# Patient Record
Sex: Male | Born: 1935 | Race: Black or African American | Hispanic: No | Marital: Married | Smoking: Former smoker
Health system: Southern US, Community
[De-identification: ages and names within clinical notes are randomized; demographics above are authoritative.]

## PROBLEM LIST (undated history)

## (undated) DIAGNOSIS — E118 Type 2 diabetes mellitus with unspecified complications: Secondary | ICD-10-CM

## (undated) DIAGNOSIS — J209 Acute bronchitis, unspecified: Secondary | ICD-10-CM

## (undated) DIAGNOSIS — M109 Gout, unspecified: Secondary | ICD-10-CM

## (undated) DIAGNOSIS — H409 Unspecified glaucoma: Secondary | ICD-10-CM

## (undated) DIAGNOSIS — I10 Essential (primary) hypertension: Secondary | ICD-10-CM

## (undated) DIAGNOSIS — C9510 Chronic leukemia of unspecified cell type not having achieved remission: Secondary | ICD-10-CM

## (undated) DIAGNOSIS — E785 Hyperlipidemia, unspecified: Secondary | ICD-10-CM

## (undated) HISTORY — DX: Essential (primary) hypertension: I10

## (undated) HISTORY — PX: COLONOSCOPY W/ POLYPECTOMY: SHX1380

## (undated) HISTORY — PX: BACK SURGERY: SHX140

## (undated) HISTORY — DX: Hyperlipidemia, unspecified: E78.5

## (undated) HISTORY — PX: TONSILLECTOMY: SUR1361

## (undated) HISTORY — DX: Acute bronchitis, unspecified: J20.9

## (undated) HISTORY — PX: DENTAL SURGERY: SHX609

---

## 1987-05-03 HISTORY — PX: HERNIA REPAIR: SHX51

## 1995-05-03 DIAGNOSIS — E118 Type 2 diabetes mellitus with unspecified complications: Secondary | ICD-10-CM

## 1995-05-03 HISTORY — DX: Type 2 diabetes mellitus with unspecified complications: E11.8

## 1995-05-03 HISTORY — PX: LUMBAR LAMINECTOMY: SHX95

## 1999-01-19 ENCOUNTER — Other Ambulatory Visit: Admission: RE | Admit: 1999-01-19 | Discharge: 1999-01-19 | Payer: Self-pay | Admitting: *Deleted

## 1999-02-11 ENCOUNTER — Encounter: Admission: RE | Admit: 1999-02-11 | Discharge: 1999-02-11 | Payer: Self-pay | Admitting: *Deleted

## 1999-02-24 ENCOUNTER — Encounter: Payer: Self-pay | Admitting: *Deleted

## 1999-02-24 ENCOUNTER — Ambulatory Visit (HOSPITAL_COMMUNITY): Admission: RE | Admit: 1999-02-24 | Discharge: 1999-02-24 | Payer: Self-pay | Admitting: *Deleted

## 1999-02-28 ENCOUNTER — Ambulatory Visit (HOSPITAL_COMMUNITY): Admission: RE | Admit: 1999-02-28 | Discharge: 1999-02-28 | Payer: Self-pay | Admitting: Orthopaedic Surgery

## 1999-02-28 ENCOUNTER — Encounter: Payer: Self-pay | Admitting: Orthopaedic Surgery

## 1999-03-09 ENCOUNTER — Encounter: Admission: RE | Admit: 1999-03-09 | Discharge: 1999-06-07 | Payer: Self-pay | Admitting: Anesthesiology

## 1999-05-03 DIAGNOSIS — C9510 Chronic leukemia of unspecified cell type not having achieved remission: Secondary | ICD-10-CM

## 1999-05-03 HISTORY — DX: Chronic leukemia of unspecified cell type not having achieved remission: C95.10

## 1999-08-30 ENCOUNTER — Ambulatory Visit (HOSPITAL_COMMUNITY): Admission: RE | Admit: 1999-08-30 | Discharge: 1999-08-31 | Payer: Self-pay | Admitting: Neurosurgery

## 1999-08-30 ENCOUNTER — Encounter: Payer: Self-pay | Admitting: Neurosurgery

## 2003-01-13 ENCOUNTER — Ambulatory Visit (HOSPITAL_COMMUNITY): Admission: RE | Admit: 2003-01-13 | Discharge: 2003-01-13 | Payer: Self-pay | Admitting: Oncology

## 2003-01-13 ENCOUNTER — Encounter (HOSPITAL_COMMUNITY): Payer: Self-pay | Admitting: Oncology

## 2003-08-02 ENCOUNTER — Emergency Department (HOSPITAL_COMMUNITY): Admission: AD | Admit: 2003-08-02 | Discharge: 2003-08-02 | Payer: Self-pay | Admitting: Internal Medicine

## 2004-03-16 ENCOUNTER — Ambulatory Visit: Payer: Self-pay | Admitting: Internal Medicine

## 2004-06-17 ENCOUNTER — Ambulatory Visit: Payer: Self-pay | Admitting: Oncology

## 2004-08-02 ENCOUNTER — Ambulatory Visit: Payer: Self-pay | Admitting: Internal Medicine

## 2004-12-15 ENCOUNTER — Ambulatory Visit: Payer: Self-pay | Admitting: Oncology

## 2004-12-28 ENCOUNTER — Ambulatory Visit (HOSPITAL_COMMUNITY): Admission: RE | Admit: 2004-12-28 | Discharge: 2004-12-28 | Payer: Self-pay | Admitting: Oncology

## 2005-01-28 ENCOUNTER — Ambulatory Visit: Payer: Self-pay | Admitting: Internal Medicine

## 2005-02-01 ENCOUNTER — Ambulatory Visit: Payer: Self-pay | Admitting: Internal Medicine

## 2005-03-29 ENCOUNTER — Ambulatory Visit: Payer: Self-pay | Admitting: Internal Medicine

## 2005-08-02 ENCOUNTER — Ambulatory Visit: Payer: Self-pay | Admitting: Internal Medicine

## 2005-08-03 ENCOUNTER — Encounter: Admission: RE | Admit: 2005-08-03 | Discharge: 2005-08-03 | Payer: Self-pay | Admitting: Internal Medicine

## 2005-12-19 ENCOUNTER — Ambulatory Visit: Payer: Self-pay | Admitting: Oncology

## 2005-12-26 LAB — CBC WITH DIFFERENTIAL/PLATELET
BASO%: 0.3 % (ref 0.0–2.0)
Basophils Absolute: 0 10*3/uL (ref 0.0–0.1)
EOS%: 0.5 % (ref 0.0–7.0)
Eosinophils Absolute: 0.1 10*3/uL (ref 0.0–0.5)
HCT: 39.2 % (ref 38.7–49.9)
HGB: 12.8 g/dL — ABNORMAL LOW (ref 13.0–17.1)
LYMPH%: 75.6 % — ABNORMAL HIGH (ref 14.0–48.0)
MCH: 29.4 pg (ref 28.0–33.4)
MCHC: 32.8 g/dL (ref 32.0–35.9)
MCV: 89.6 fL (ref 81.6–98.0)
MONO#: 0.2 10*3/uL (ref 0.1–0.9)
MONO%: 1.1 % (ref 0.0–13.0)
NEUT#: 3.3 10*3/uL (ref 1.5–6.5)
NEUT%: 22.5 % — ABNORMAL LOW (ref 40.0–75.0)
Platelets: 177 10*3/uL (ref 145–400)
RBC: 4.37 10*6/uL (ref 4.20–5.71)
RDW: 14.7 % — ABNORMAL HIGH (ref 11.2–14.6)
WBC: 14.6 10*3/uL — ABNORMAL HIGH (ref 4.0–10.0)
lymph#: 11 10*3/uL — ABNORMAL HIGH (ref 0.9–3.3)

## 2005-12-26 LAB — LACTATE DEHYDROGENASE: LDH: 117 U/L (ref 94–250)

## 2005-12-26 LAB — COMPREHENSIVE METABOLIC PANEL
ALT: <8 U/L (ref 0–40)
AST: 12 U/L (ref 0–37)
Albumin: 4.1 g/dL (ref 3.5–5.2)
Alkaline Phosphatase: 73 U/L (ref 39–117)
BUN: 14 mg/dL (ref 6–23)
CO2: 26 mEq/L (ref 19–32)
Calcium: 9.3 mg/dL (ref 8.4–10.5)
Chloride: 108 mEq/L (ref 96–112)
Creatinine, Ser: 1.41 mg/dL (ref 0.40–1.50)
Glucose, Bld: 187 mg/dL — ABNORMAL HIGH (ref 70–99)
Potassium: 4.5 mEq/L (ref 3.5–5.3)
Sodium: 143 mEq/L (ref 135–145)
Total Bilirubin: 0.7 mg/dL (ref 0.3–1.2)
Total Protein: 6.5 g/dL (ref 6.0–8.3)

## 2006-02-02 ENCOUNTER — Ambulatory Visit: Payer: Self-pay | Admitting: Internal Medicine

## 2006-03-07 ENCOUNTER — Ambulatory Visit: Payer: Self-pay | Admitting: Internal Medicine

## 2006-05-11 ENCOUNTER — Ambulatory Visit: Payer: Self-pay | Admitting: Internal Medicine

## 2006-10-10 ENCOUNTER — Ambulatory Visit: Payer: Self-pay | Admitting: Internal Medicine

## 2006-10-10 LAB — CONVERTED CEMR LAB
BUN: 10 mg/dL (ref 6–23)
CO2: 30 meq/L (ref 19–32)
Calcium: 9.8 mg/dL (ref 8.4–10.5)
Chloride: 105 meq/L (ref 96–112)
Creatinine, Ser: 1.3 mg/dL (ref 0.4–1.5)
GFR calc Af Amer: 70 mL/min
GFR calc non Af Amer: 58 mL/min
Glucose, Bld: 88 mg/dL (ref 70–99)
Hgb A1c MFr Bld: 6.1 % — ABNORMAL HIGH (ref 4.6–6.0)
Potassium: 4.4 meq/L (ref 3.5–5.1)
Sodium: 142 meq/L (ref 135–145)

## 2006-12-24 ENCOUNTER — Ambulatory Visit: Payer: Self-pay | Admitting: Oncology

## 2006-12-26 ENCOUNTER — Ambulatory Visit (HOSPITAL_COMMUNITY): Admission: RE | Admit: 2006-12-26 | Discharge: 2006-12-26 | Payer: Self-pay | Admitting: Oncology

## 2006-12-26 ENCOUNTER — Encounter: Payer: Self-pay | Admitting: Internal Medicine

## 2006-12-26 LAB — COMPREHENSIVE METABOLIC PANEL
ALT: 11 U/L (ref 0–53)
AST: 13 U/L (ref 0–37)
Albumin: 4.2 g/dL (ref 3.5–5.2)
Alkaline Phosphatase: 76 U/L (ref 39–117)
BUN: 13 mg/dL (ref 6–23)
CO2: 25 mEq/L (ref 19–32)
Calcium: 9.5 mg/dL (ref 8.4–10.5)
Chloride: 110 mEq/L (ref 96–112)
Creatinine, Ser: 1.41 mg/dL (ref 0.40–1.50)
Glucose, Bld: 121 mg/dL — ABNORMAL HIGH (ref 70–99)
Potassium: 4.2 mEq/L (ref 3.5–5.3)
Sodium: 143 mEq/L (ref 135–145)
Total Bilirubin: 1 mg/dL (ref 0.3–1.2)
Total Protein: 6.3 g/dL (ref 6.0–8.3)

## 2006-12-26 LAB — CBC WITH DIFFERENTIAL/PLATELET
BASO%: 0.4 % (ref 0.0–2.0)
Basophils Absolute: 0.1 10*3/uL (ref 0.0–0.1)
EOS%: 0.6 % (ref 0.0–7.0)
Eosinophils Absolute: 0.1 10*3/uL (ref 0.0–0.5)
HCT: 37.9 % — ABNORMAL LOW (ref 38.7–49.9)
HGB: 12.8 g/dL — ABNORMAL LOW (ref 13.0–17.1)
LYMPH%: 76.5 % — ABNORMAL HIGH (ref 14.0–48.0)
MCH: 29.8 pg (ref 28.0–33.4)
MCHC: 33.8 g/dL (ref 32.0–35.9)
MCV: 88.1 fL (ref 81.6–98.0)
MONO#: 0.2 10*3/uL (ref 0.1–0.9)
MONO%: 1.4 % (ref 0.0–13.0)
NEUT#: 3.7 10*3/uL (ref 1.5–6.5)
NEUT%: 21.1 % — ABNORMAL LOW (ref 40.0–75.0)
Platelets: 166 10*3/uL (ref 145–400)
RBC: 4.3 10*6/uL (ref 4.20–5.71)
RDW: 14.6 % (ref 11.2–14.6)
WBC: 17.5 10*3/uL — ABNORMAL HIGH (ref 4.0–10.0)
lymph#: 13.4 10*3/uL — ABNORMAL HIGH (ref 0.9–3.3)

## 2006-12-26 LAB — LACTATE DEHYDROGENASE: LDH: 97 U/L (ref 94–250)

## 2007-01-23 ENCOUNTER — Telehealth: Payer: Self-pay | Admitting: *Deleted

## 2007-03-08 ENCOUNTER — Ambulatory Visit: Payer: Self-pay | Admitting: Internal Medicine

## 2007-03-08 DIAGNOSIS — M545 Low back pain, unspecified: Secondary | ICD-10-CM | POA: Insufficient documentation

## 2007-03-08 DIAGNOSIS — M109 Gout, unspecified: Secondary | ICD-10-CM | POA: Insufficient documentation

## 2007-03-08 DIAGNOSIS — M1990 Unspecified osteoarthritis, unspecified site: Secondary | ICD-10-CM | POA: Insufficient documentation

## 2007-03-08 DIAGNOSIS — E119 Type 2 diabetes mellitus without complications: Secondary | ICD-10-CM | POA: Insufficient documentation

## 2007-03-08 DIAGNOSIS — C9110 Chronic lymphocytic leukemia of B-cell type not having achieved remission: Secondary | ICD-10-CM | POA: Insufficient documentation

## 2007-03-08 DIAGNOSIS — I10 Essential (primary) hypertension: Secondary | ICD-10-CM | POA: Insufficient documentation

## 2007-03-08 DIAGNOSIS — H409 Unspecified glaucoma: Secondary | ICD-10-CM | POA: Insufficient documentation

## 2007-03-08 LAB — CONVERTED CEMR LAB
ALT: 18 units/L (ref 0–53)
AST: 21 units/L (ref 0–37)
Albumin: 3.8 g/dL (ref 3.5–5.2)
Alkaline Phosphatase: 67 units/L (ref 39–117)
BUN: 13 mg/dL (ref 6–23)
Basophils Absolute: 0 10*3/uL (ref 0.0–0.1)
Basophils Relative: 0 % (ref 0.0–1.0)
Bilirubin Urine: NEGATIVE
Bilirubin, Direct: 0.2 mg/dL (ref 0.0–0.3)
CO2: 29 meq/L (ref 19–32)
Calcium: 9.5 mg/dL (ref 8.4–10.5)
Chloride: 108 meq/L (ref 96–112)
Cholesterol: 190 mg/dL (ref 0–200)
Creatinine, Ser: 1.3 mg/dL (ref 0.4–1.5)
Creatinine,U: 99.1 mg/dL
Eosinophils Absolute: 0.2 10*3/uL (ref 0.0–0.6)
Eosinophils Relative: 1.2 % (ref 0.0–5.0)
GFR calc Af Amer: 70 mL/min
GFR calc non Af Amer: 58 mL/min
Glucose, Bld: 106 mg/dL — ABNORMAL HIGH (ref 70–99)
Glucose, Urine, Semiquant: NEGATIVE
HCT: 38.2 % — ABNORMAL LOW (ref 39.0–52.0)
HDL: 41.3 mg/dL (ref >39.0)
Hemoglobin: 13.2 g/dL (ref 13.0–17.0)
Hgb A1c MFr Bld: 5.8 % (ref 4.6–6.0)
Ketones, urine, test strip: NEGATIVE
LDL Cholesterol: 127 mg/dL — ABNORMAL HIGH (ref 0–99)
Lymphocytes Relative: 74.9 % — ABNORMAL HIGH (ref 12.0–46.0)
MCHC: 34.6 g/dL (ref 30.0–36.0)
MCV: 90 fL (ref 78.0–100.0)
Microalb Creat Ratio: 5 mg/g (ref 0.0–30.0)
Microalb, Ur: 0.5 mg/dL (ref 0.0–1.9)
Monocytes Absolute: 0.6 10*3/uL (ref 0.2–0.7)
Monocytes Relative: 4.7 % (ref 3.0–11.0)
Neutro Abs: 2.6 10*3/uL (ref 1.4–7.7)
Neutrophils Relative %: 19.2 % — ABNORMAL LOW (ref 43.0–77.0)
Nitrite: NEGATIVE
PSA: 0.37 ng/mL (ref 0.10–4.00)
Platelets: 160 10*3/uL (ref 150–400)
Potassium: 4.4 meq/L (ref 3.5–5.1)
Protein, U semiquant: NEGATIVE
RBC: 4.25 M/uL (ref 4.22–5.81)
RDW: 13.5 % (ref 11.5–14.6)
Sodium: 143 meq/L (ref 135–145)
Specific Gravity, Urine: 1.015
TSH: 1.02 microintl units/mL (ref 0.35–5.50)
Total Bilirubin: 1.2 mg/dL (ref 0.3–1.2)
Total CHOL/HDL Ratio: 4.6
Total Protein: 6 g/dL (ref 6.0–8.3)
Triglycerides: 110 mg/dL (ref 0–149)
Urobilinogen, UA: 0.2
VLDL: 22 mg/dL (ref 0–40)
WBC Urine, dipstick: NEGATIVE
WBC: 13.7 10*3/uL — ABNORMAL HIGH (ref 4.5–10.5)
pH: 6

## 2007-03-15 ENCOUNTER — Ambulatory Visit: Payer: Self-pay | Admitting: Internal Medicine

## 2007-03-27 ENCOUNTER — Ambulatory Visit: Payer: Self-pay | Admitting: Gastroenterology

## 2007-04-09 ENCOUNTER — Encounter: Payer: Self-pay | Admitting: Internal Medicine

## 2007-04-09 ENCOUNTER — Ambulatory Visit: Payer: Self-pay | Admitting: Gastroenterology

## 2007-09-12 ENCOUNTER — Ambulatory Visit: Payer: Self-pay | Admitting: Internal Medicine

## 2007-09-12 DIAGNOSIS — J209 Acute bronchitis, unspecified: Secondary | ICD-10-CM

## 2007-09-12 HISTORY — DX: Acute bronchitis, unspecified: J20.9

## 2007-09-12 LAB — CONVERTED CEMR LAB
BUN: 14 mg/dL (ref 6–23)
Basophils Absolute: 0 10*3/uL (ref 0.0–0.1)
Basophils Relative: 0 % (ref 0.0–1.0)
CO2: 31 meq/L (ref 19–32)
Calcium: 9.8 mg/dL (ref 8.4–10.5)
Chloride: 112 meq/L (ref 96–112)
Creatinine, Ser: 1.4 mg/dL (ref 0.4–1.5)
Creatinine,U: 80.3 mg/dL
Eosinophils Absolute: 0.1 10*3/uL (ref 0.0–0.7)
Eosinophils Relative: 0.9 % (ref 0.0–5.0)
GFR calc Af Amer: 64 mL/min
GFR calc non Af Amer: 53 mL/min
Glucose, Bld: 113 mg/dL — ABNORMAL HIGH (ref 70–99)
HCT: 39.9 % (ref 39.0–52.0)
Hemoglobin: 13 g/dL (ref 13.0–17.0)
Hgb A1c MFr Bld: 6.1 % — ABNORMAL HIGH (ref 4.6–6.0)
Lymphocytes Relative: 73.6 % — ABNORMAL HIGH (ref 12.0–46.0)
MCHC: 32.7 g/dL (ref 30.0–36.0)
MCV: 90.6 fL (ref 78.0–100.0)
Microalb Creat Ratio: 31.1 mg/g — ABNORMAL HIGH (ref 0.0–30.0)
Microalb, Ur: 2.5 mg/dL — ABNORMAL HIGH (ref 0.0–1.9)
Monocytes Absolute: 0.5 10*3/uL (ref 0.1–1.0)
Monocytes Relative: 3.7 % (ref 3.0–12.0)
Neutro Abs: 3 10*3/uL (ref 1.4–7.7)
Neutrophils Relative %: 21.8 % — ABNORMAL LOW (ref 43.0–77.0)
Platelets: 207 10*3/uL (ref 150–400)
Potassium: 4.9 meq/L (ref 3.5–5.1)
RBC: 4.41 M/uL (ref 4.22–5.81)
RDW: 13.8 % (ref 11.5–14.6)
Sodium: 147 meq/L — ABNORMAL HIGH (ref 135–145)
Uric Acid, Serum: 4.2 mg/dL (ref 4.0–7.8)
WBC: 13.7 10*3/uL — ABNORMAL HIGH (ref 4.5–10.5)

## 2007-12-25 ENCOUNTER — Ambulatory Visit: Payer: Self-pay | Admitting: Oncology

## 2007-12-27 ENCOUNTER — Encounter: Payer: Self-pay | Admitting: Internal Medicine

## 2007-12-27 LAB — COMPREHENSIVE METABOLIC PANEL
ALT: 13 U/L (ref 0–53)
AST: 16 U/L (ref 0–37)
Albumin: 4.2 g/dL (ref 3.5–5.2)
Alkaline Phosphatase: 72 U/L (ref 39–117)
BUN: 14 mg/dL (ref 6–23)
CO2: 24 mEq/L (ref 19–32)
Calcium: 9.6 mg/dL (ref 8.4–10.5)
Chloride: 110 mEq/L (ref 96–112)
Creatinine, Ser: 1.27 mg/dL (ref 0.40–1.50)
Glucose, Bld: 173 mg/dL — ABNORMAL HIGH (ref 70–99)
Potassium: 4.2 mEq/L (ref 3.5–5.3)
Sodium: 144 mEq/L (ref 135–145)
Total Bilirubin: 1 mg/dL (ref 0.3–1.2)
Total Protein: 6.6 g/dL (ref 6.0–8.3)

## 2007-12-27 LAB — CBC WITH DIFFERENTIAL/PLATELET
BASO%: 0.2 % (ref 0.0–2.0)
Basophils Absolute: 0 10*3/uL (ref 0.0–0.1)
EOS%: 1.1 % (ref 0.0–7.0)
Eosinophils Absolute: 0.1 10*3/uL (ref 0.0–0.5)
HCT: 40.2 % (ref 38.7–49.9)
HGB: 13.3 g/dL (ref 13.0–17.1)
LYMPH%: 74.9 % — ABNORMAL HIGH (ref 14.0–48.0)
MCH: 30.2 pg (ref 28.0–33.4)
MCHC: 33 g/dL (ref 32.0–35.9)
MCV: 91.5 fL (ref 81.6–98.0)
MONO#: 0.2 10*3/uL (ref 0.1–0.9)
MONO%: 1.8 % (ref 0.0–13.0)
NEUT#: 2.8 10*3/uL (ref 1.5–6.5)
NEUT%: 22 % — ABNORMAL LOW (ref 40.0–75.0)
Platelets: 162 10*3/uL (ref 145–400)
RBC: 4.39 10*6/uL (ref 4.20–5.71)
RDW: 14.9 % — ABNORMAL HIGH (ref 11.2–14.6)
WBC: 12.6 10*3/uL — ABNORMAL HIGH (ref 4.0–10.0)
lymph#: 9.4 10*3/uL — ABNORMAL HIGH (ref 0.9–3.3)

## 2007-12-27 LAB — LACTATE DEHYDROGENASE: LDH: 96 U/L (ref 94–250)

## 2008-01-31 ENCOUNTER — Ambulatory Visit: Payer: Self-pay | Admitting: Internal Medicine

## 2008-03-18 ENCOUNTER — Ambulatory Visit: Payer: Self-pay | Admitting: Internal Medicine

## 2008-03-18 DIAGNOSIS — F528 Other sexual dysfunction not due to a substance or known physiological condition: Secondary | ICD-10-CM | POA: Insufficient documentation

## 2008-03-18 LAB — CONVERTED CEMR LAB
ALT: 14 units/L (ref 0–53)
AST: 19 units/L (ref 0–37)
Albumin: 4 g/dL (ref 3.5–5.2)
Alkaline Phosphatase: 66 units/L (ref 39–117)
BUN: 13 mg/dL (ref 6–23)
Basophils Absolute: 0 10*3/uL (ref 0.0–0.1)
Basophils Relative: 0.1 % (ref 0.0–3.0)
Bilirubin, Direct: 0.2 mg/dL (ref 0.0–0.3)
CO2: 30 meq/L (ref 19–32)
Calcium: 9.2 mg/dL (ref 8.4–10.5)
Chloride: 110 meq/L (ref 96–112)
Cholesterol: 176 mg/dL (ref 0–200)
Creatinine, Ser: 1.3 mg/dL (ref 0.4–1.5)
Eosinophils Absolute: 0.1 10*3/uL (ref 0.0–0.7)
Eosinophils Relative: 0.9 % (ref 0.0–5.0)
GFR calc Af Amer: 69 mL/min
GFR calc non Af Amer: 57 mL/min
Glucose, Bld: 109 mg/dL — ABNORMAL HIGH (ref 70–99)
HCT: 42.1 % (ref 39.0–52.0)
HDL: 59.8 mg/dL (ref >39.0)
Hemoglobin: 13.6 g/dL (ref 13.0–17.0)
Hgb A1c MFr Bld: 5.6 % (ref 4.6–6.0)
LDL Cholesterol: 106 mg/dL — ABNORMAL HIGH (ref 0–99)
Lymphocytes Relative: 71.2 % — ABNORMAL HIGH (ref 12.0–46.0)
MCHC: 32.2 g/dL (ref 30.0–36.0)
MCV: 93.6 fL (ref 78.0–100.0)
Monocytes Absolute: 0.8 10*3/uL (ref 0.1–1.0)
Monocytes Relative: 6.2 % (ref 3.0–12.0)
Neutro Abs: 2.8 10*3/uL (ref 1.4–7.7)
Neutrophils Relative %: 21.6 % — ABNORMAL LOW (ref 43.0–77.0)
PSA: 0.47 ng/mL (ref 0.10–4.00)
Platelets: 148 10*3/uL — ABNORMAL LOW (ref 150–400)
Potassium: 4.6 meq/L (ref 3.5–5.1)
RBC: 4.5 M/uL (ref 4.22–5.81)
RDW: 13.8 % (ref 11.5–14.6)
Sodium: 145 meq/L (ref 135–145)
TSH: 1.11 microintl units/mL (ref 0.35–5.50)
Total Bilirubin: 1.2 mg/dL (ref 0.3–1.2)
Total CHOL/HDL Ratio: 2.9
Total Protein: 6.4 g/dL (ref 6.0–8.3)
Triglycerides: 53 mg/dL (ref 0–149)
Uric Acid, Serum: 4.6 mg/dL (ref 4.0–7.8)
VLDL: 11 mg/dL (ref 0–40)
WBC: 13.1 10*3/uL — ABNORMAL HIGH (ref 4.5–10.5)

## 2008-08-02 ENCOUNTER — Telehealth: Payer: Self-pay | Admitting: Family Medicine

## 2008-08-02 ENCOUNTER — Ambulatory Visit: Payer: Self-pay | Admitting: Family Medicine

## 2008-08-02 DIAGNOSIS — M545 Low back pain, unspecified: Secondary | ICD-10-CM | POA: Insufficient documentation

## 2008-08-04 ENCOUNTER — Ambulatory Visit: Payer: Self-pay | Admitting: Internal Medicine

## 2008-08-04 DIAGNOSIS — IMO0002 Reserved for concepts with insufficient information to code with codable children: Secondary | ICD-10-CM | POA: Insufficient documentation

## 2008-08-04 LAB — CONVERTED CEMR LAB
BUN: 18 mg/dL (ref 6–23)
CO2: 28 meq/L (ref 19–32)
Calcium: 9 mg/dL (ref 8.4–10.5)
Chloride: 109 meq/L (ref 96–112)
Creatinine, Ser: 1.3 mg/dL (ref 0.4–1.5)
GFR calc non Af Amer: 69.32 mL/min (ref >60)
Glucose, Bld: 108 mg/dL — ABNORMAL HIGH (ref 70–99)
Hgb A1c MFr Bld: 5.9 % (ref 4.6–6.5)
Potassium: 4.3 meq/L (ref 3.5–5.1)
Sodium: 142 meq/L (ref 135–145)

## 2008-08-06 ENCOUNTER — Encounter: Admission: RE | Admit: 2008-08-06 | Discharge: 2008-08-06 | Payer: Self-pay | Admitting: Internal Medicine

## 2008-08-08 ENCOUNTER — Telehealth: Payer: Self-pay | Admitting: Internal Medicine

## 2008-08-22 ENCOUNTER — Telehealth: Payer: Self-pay | Admitting: Internal Medicine

## 2008-09-11 ENCOUNTER — Ambulatory Visit: Payer: Self-pay | Admitting: Internal Medicine

## 2008-09-11 LAB — CONVERTED CEMR LAB
Basophils Absolute: 0.1 10*3/uL (ref 0.0–0.1)
Basophils Relative: 0.4 % (ref 0.0–3.0)
Eosinophils Absolute: 0.1 10*3/uL (ref 0.0–0.7)
Eosinophils Relative: 0.5 % (ref 0.0–5.0)
HCT: 39.3 % (ref 39.0–52.0)
Hemoglobin: 13.2 g/dL (ref 13.0–17.0)
Hgb A1c MFr Bld: 6.4 % (ref 4.6–6.5)
Lymphocytes Relative: 73.2 % — ABNORMAL HIGH (ref 12.0–46.0)
Lymphs Abs: 13.5 10*3/uL — ABNORMAL HIGH (ref 0.7–4.0)
MCHC: 33.6 g/dL (ref 30.0–36.0)
MCV: 91.6 fL (ref 78.0–100.0)
Monocytes Absolute: 1.1 10*3/uL — ABNORMAL HIGH (ref 0.1–1.0)
Monocytes Relative: 5.8 % (ref 3.0–12.0)
Neutro Abs: 3.7 10*3/uL (ref 1.4–7.7)
Neutrophils Relative %: 20.1 % — ABNORMAL LOW (ref 43.0–77.0)
Platelets: 181 10*3/uL (ref 150.0–400.0)
RBC: 4.28 M/uL (ref 4.22–5.81)
RDW: 14.5 % (ref 11.5–14.6)
Uric Acid, Serum: 5.4 mg/dL (ref 4.0–7.8)
WBC: 18.5 10*3/uL (ref 4.5–10.5)

## 2008-12-12 ENCOUNTER — Ambulatory Visit: Payer: Self-pay | Admitting: Internal Medicine

## 2008-12-12 DIAGNOSIS — E538 Deficiency of other specified B group vitamins: Secondary | ICD-10-CM | POA: Insufficient documentation

## 2008-12-12 DIAGNOSIS — E1142 Type 2 diabetes mellitus with diabetic polyneuropathy: Secondary | ICD-10-CM | POA: Insufficient documentation

## 2008-12-12 LAB — CONVERTED CEMR LAB
BUN: 16 mg/dL (ref 6–23)
Basophils Absolute: 0.1 10*3/uL (ref 0.0–0.1)
Basophils Relative: 0.9 % (ref 0.0–3.0)
CO2: 31 meq/L (ref 19–32)
Calcium: 9.9 mg/dL (ref 8.4–10.5)
Chloride: 111 meq/L (ref 96–112)
Creatinine, Ser: 1.4 mg/dL (ref 0.4–1.5)
Eosinophils Absolute: 0.1 10*3/uL (ref 0.0–0.7)
Eosinophils Relative: 0.6 % (ref 0.0–5.0)
Folate: 11.1 ng/mL
GFR calc non Af Amer: 63.57 mL/min (ref >60)
Glucose, Bld: 84 mg/dL (ref 70–99)
HCT: 42 % (ref 39.0–52.0)
Hemoglobin: 13.6 g/dL (ref 13.0–17.0)
Hgb A1c MFr Bld: 5.7 % (ref 4.6–6.5)
Lymphocytes Relative: 66.9 % — ABNORMAL HIGH (ref 12.0–46.0)
Lymphs Abs: 8 10*3/uL — ABNORMAL HIGH (ref 0.7–4.0)
MCHC: 32.3 g/dL (ref 30.0–36.0)
MCV: 93.2 fL (ref 78.0–100.0)
Monocytes Absolute: 0.5 10*3/uL (ref 0.1–1.0)
Monocytes Relative: 3.9 % (ref 3.0–12.0)
Neutro Abs: 3.3 10*3/uL (ref 1.4–7.7)
Neutrophils Relative %: 27.7 % — ABNORMAL LOW (ref 43.0–77.0)
Platelets: 166 10*3/uL (ref 150.0–400.0)
Potassium: 4.9 meq/L (ref 3.5–5.1)
RBC: 4.51 M/uL (ref 4.22–5.81)
RDW: 13.5 % (ref 11.5–14.6)
Sodium: 146 meq/L — ABNORMAL HIGH (ref 135–145)
Vitamin B-12: 166 pg/mL — ABNORMAL LOW (ref 211–911)
WBC: 12 10*3/uL — ABNORMAL HIGH (ref 4.5–10.5)

## 2008-12-26 ENCOUNTER — Ambulatory Visit: Payer: Self-pay | Admitting: Oncology

## 2008-12-30 ENCOUNTER — Encounter: Payer: Self-pay | Admitting: Internal Medicine

## 2008-12-30 LAB — CBC WITH DIFFERENTIAL/PLATELET
BASO%: 0.5 % (ref 0.0–2.0)
Basophils Absolute: 0.1 10*3/uL (ref 0.0–0.1)
EOS%: 0.8 % (ref 0.0–7.0)
Eosinophils Absolute: 0.1 10*3/uL (ref 0.0–0.5)
HCT: 40.8 % (ref 38.4–49.9)
HGB: 13.2 g/dL (ref 13.0–17.1)
LYMPH%: 74.5 % — ABNORMAL HIGH (ref 14.0–49.0)
MCH: 29.6 pg (ref 27.2–33.4)
MCHC: 32.4 g/dL (ref 32.0–36.0)
MCV: 91.2 fL (ref 79.3–98.0)
MONO#: 0.3 10*3/uL (ref 0.1–0.9)
MONO%: 2.5 % (ref 0.0–14.0)
NEUT#: 2.8 10*3/uL (ref 1.5–6.5)
NEUT%: 21.7 % — ABNORMAL LOW (ref 39.0–75.0)
Platelets: 154 10*3/uL (ref 140–400)
RBC: 4.47 10*6/uL (ref 4.20–5.82)
RDW: 13.5 % (ref 11.0–14.6)
WBC: 12.8 10*3/uL — ABNORMAL HIGH (ref 4.0–10.3)
lymph#: 9.5 10*3/uL — ABNORMAL HIGH (ref 0.9–3.3)

## 2008-12-30 LAB — COMPREHENSIVE METABOLIC PANEL
ALT: 14 U/L (ref 0–53)
AST: 13 U/L (ref 0–37)
Albumin: 4.1 g/dL (ref 3.5–5.2)
Alkaline Phosphatase: 69 U/L (ref 39–117)
BUN: 13 mg/dL (ref 6–23)
CO2: 23 mEq/L (ref 19–32)
Calcium: 9.5 mg/dL (ref 8.4–10.5)
Chloride: 109 mEq/L (ref 96–112)
Creatinine, Ser: 1.33 mg/dL (ref 0.40–1.50)
Glucose, Bld: 153 mg/dL — ABNORMAL HIGH (ref 70–99)
Potassium: 4.4 mEq/L (ref 3.5–5.3)
Sodium: 141 mEq/L (ref 135–145)
Total Bilirubin: 0.7 mg/dL (ref 0.3–1.2)
Total Protein: 6.5 g/dL (ref 6.0–8.3)

## 2008-12-30 LAB — LACTATE DEHYDROGENASE: LDH: 92 U/L — ABNORMAL LOW (ref 94–250)

## 2009-01-23 ENCOUNTER — Telehealth: Payer: Self-pay | Admitting: Internal Medicine

## 2009-02-04 ENCOUNTER — Ambulatory Visit: Payer: Self-pay | Admitting: Internal Medicine

## 2009-03-24 ENCOUNTER — Ambulatory Visit: Payer: Self-pay | Admitting: Internal Medicine

## 2009-06-29 ENCOUNTER — Ambulatory Visit: Payer: Self-pay | Admitting: Internal Medicine

## 2009-07-27 ENCOUNTER — Ambulatory Visit: Payer: Self-pay | Admitting: Internal Medicine

## 2009-09-01 ENCOUNTER — Ambulatory Visit: Payer: Self-pay | Admitting: Internal Medicine

## 2009-09-29 ENCOUNTER — Ambulatory Visit: Payer: Self-pay | Admitting: Internal Medicine

## 2009-10-01 LAB — CONVERTED CEMR LAB
Hgb A1c MFr Bld: 6 % (ref 4.6–6.5)
Vitamin B-12: 270 pg/mL (ref 211–911)

## 2009-10-06 ENCOUNTER — Ambulatory Visit: Payer: Self-pay | Admitting: Internal Medicine

## 2009-11-04 ENCOUNTER — Ambulatory Visit: Payer: Self-pay | Admitting: Internal Medicine

## 2009-12-03 ENCOUNTER — Ambulatory Visit: Payer: Self-pay | Admitting: Internal Medicine

## 2009-12-29 ENCOUNTER — Ambulatory Visit: Payer: Self-pay | Admitting: Oncology

## 2009-12-31 ENCOUNTER — Encounter: Payer: Self-pay | Admitting: Internal Medicine

## 2009-12-31 LAB — CBC WITH DIFFERENTIAL/PLATELET
BASO%: 0.2 % (ref 0.0–2.0)
Basophils Absolute: 0 10*3/uL (ref 0.0–0.1)
EOS%: 0.8 % (ref 0.0–7.0)
Eosinophils Absolute: 0.1 10*3/uL (ref 0.0–0.5)
HCT: 39.8 % (ref 38.4–49.9)
HGB: 12.9 g/dL — ABNORMAL LOW (ref 13.0–17.1)
LYMPH%: 70.3 % — ABNORMAL HIGH (ref 14.0–49.0)
MCH: 29.9 pg (ref 27.2–33.4)
MCHC: 32.5 g/dL (ref 32.0–36.0)
MCV: 92.1 fL (ref 79.3–98.0)
MONO#: 0.3 10*3/uL (ref 0.1–0.9)
MONO%: 1.8 % (ref 0.0–14.0)
NEUT#: 4 10*3/uL (ref 1.5–6.5)
NEUT%: 26.9 % — ABNORMAL LOW (ref 39.0–75.0)
Platelets: 170 10*3/uL (ref 140–400)
RBC: 4.32 10*6/uL (ref 4.20–5.82)
RDW: 14.6 % (ref 11.0–14.6)
WBC: 15 10*3/uL — ABNORMAL HIGH (ref 4.0–10.3)
lymph#: 10.6 10*3/uL — ABNORMAL HIGH (ref 0.9–3.3)

## 2009-12-31 LAB — COMPREHENSIVE METABOLIC PANEL
ALT: 11 U/L (ref 0–53)
AST: 15 U/L (ref 0–37)
Albumin: 4 g/dL (ref 3.5–5.2)
Alkaline Phosphatase: 72 U/L (ref 39–117)
BUN: 18 mg/dL (ref 6–23)
CO2: 24 mEq/L (ref 19–32)
Calcium: 9.4 mg/dL (ref 8.4–10.5)
Chloride: 108 mEq/L (ref 96–112)
Creatinine, Ser: 1.49 mg/dL (ref 0.40–1.50)
Glucose, Bld: 134 mg/dL — ABNORMAL HIGH (ref 70–99)
Potassium: 4.3 mEq/L (ref 3.5–5.3)
Sodium: 143 mEq/L (ref 135–145)
Total Bilirubin: 0.9 mg/dL (ref 0.3–1.2)
Total Protein: 6.1 g/dL (ref 6.0–8.3)

## 2009-12-31 LAB — LACTATE DEHYDROGENASE: LDH: 108 U/L (ref 94–250)

## 2010-02-01 ENCOUNTER — Ambulatory Visit: Payer: Self-pay | Admitting: Internal Medicine

## 2010-03-24 ENCOUNTER — Ambulatory Visit: Payer: Self-pay | Admitting: Internal Medicine

## 2010-03-24 LAB — CONVERTED CEMR LAB
ALT: 11 units/L (ref 0–53)
AST: 12 units/L (ref 0–37)
Albumin: 4.1 g/dL (ref 3.5–5.2)
Alkaline Phosphatase: 67 units/L (ref 39–117)
BUN: 16 mg/dL (ref 6–23)
Basophils Absolute: 0 10*3/uL (ref 0.0–0.1)
Basophils Relative: 0 % (ref 0.0–3.0)
Bilirubin Urine: NEGATIVE
Bilirubin, Direct: 0.2 mg/dL (ref 0.0–0.3)
Blood in Urine, dipstick: NEGATIVE
CO2: 26 meq/L (ref 19–32)
Calcium: 9.2 mg/dL (ref 8.4–10.5)
Chloride: 108 meq/L (ref 96–112)
Cholesterol: 178 mg/dL (ref 0–200)
Creatinine, Ser: 1.36 mg/dL (ref 0.40–1.50)
Creatinine,U: 110.3 mg/dL
Eosinophils Absolute: 0.2 10*3/uL (ref 0.0–0.7)
Eosinophils Relative: 1.1 % (ref 0.0–5.0)
Glucose, Bld: 104 mg/dL — ABNORMAL HIGH (ref 70–99)
Glucose, Urine, Semiquant: NEGATIVE
HCT: 38.6 % — ABNORMAL LOW (ref 39.0–52.0)
HDL: 52 mg/dL (ref >39)
Hemoglobin: 12.6 g/dL — ABNORMAL LOW (ref 13.0–17.0)
Hgb A1c MFr Bld: 6.2 % (ref 4.6–6.5)
Indirect Bilirubin: 0.7 mg/dL (ref 0.0–0.9)
Ketones, urine, test strip: NEGATIVE
LDL Cholesterol: 110 mg/dL — ABNORMAL HIGH (ref 0–99)
Lymphocytes Relative: 78.6 % — ABNORMAL HIGH (ref 12.0–46.0)
Lymphs Abs: 11.2 10*3/uL — ABNORMAL HIGH (ref 0.7–4.0)
MCHC: 32.6 g/dL (ref 30.0–36.0)
MCV: 93.1 fL (ref 78.0–100.0)
Microalb Creat Ratio: 0.8 mg/g (ref 0.0–30.0)
Microalb, Ur: 0.9 mg/dL (ref 0.0–1.9)
Monocytes Absolute: 0.3 10*3/uL (ref 0.1–1.0)
Monocytes Relative: 2.1 % — ABNORMAL LOW (ref 3.0–12.0)
Neutro Abs: 2.6 10*3/uL (ref 1.4–7.7)
Neutrophils Relative %: 18.2 % — ABNORMAL LOW (ref 43.0–77.0)
Nitrite: NEGATIVE
PSA: 0.88 ng/mL (ref <=4.00)
Platelets: 166 10*3/uL (ref 150.0–400.0)
Potassium: 4.5 meq/L (ref 3.5–5.3)
Protein, U semiquant: NEGATIVE
RBC: 4.15 M/uL — ABNORMAL LOW (ref 4.22–5.81)
RDW: 14.5 % (ref 11.5–14.6)
Sodium: 142 meq/L (ref 135–145)
Specific Gravity, Urine: 1.015
TSH: 1.86 microintl units/mL (ref 0.350–4.500)
Total Bilirubin: 0.9 mg/dL (ref 0.3–1.2)
Total CHOL/HDL Ratio: 3.4
Total Protein: 6 g/dL (ref 6.0–8.3)
Triglycerides: 82 mg/dL (ref <150)
Urobilinogen, UA: 0.2
VLDL: 16 mg/dL (ref 0–40)
WBC Urine, dipstick: NEGATIVE
WBC: 14.2 10*3/uL — ABNORMAL HIGH (ref 4.5–10.5)
pH: 5.5

## 2010-03-30 ENCOUNTER — Ambulatory Visit: Payer: Self-pay | Admitting: Internal Medicine

## 2010-03-30 ENCOUNTER — Encounter: Payer: Self-pay | Admitting: Internal Medicine

## 2010-05-30 LAB — CONVERTED CEMR LAB
ALT: 15 units/L (ref 0–53)
AST: 18 units/L (ref 0–37)
Albumin: 4 g/dL (ref 3.5–5.2)
Alkaline Phosphatase: 72 units/L (ref 39–117)
BUN: 11 mg/dL (ref 6–23)
Basophils Absolute: 0 10*3/uL (ref 0.0–0.1)
Basophils Relative: 0 % (ref 0.0–3.0)
Bilirubin, Direct: 0.2 mg/dL (ref 0.0–0.3)
CO2: 32 meq/L (ref 19–32)
Calcium: 9.5 mg/dL (ref 8.4–10.5)
Chloride: 108 meq/L (ref 96–112)
Cholesterol: 184 mg/dL (ref 0–200)
Creatinine, Ser: 1.3 mg/dL (ref 0.4–1.5)
Eosinophils Absolute: 0.1 10*3/uL (ref 0.0–0.7)
Eosinophils Relative: 0.8 % (ref 0.0–5.0)
Folate: 14 ng/mL
GFR calc non Af Amer: 69.2 mL/min (ref >60)
Glucose, Bld: 101 mg/dL — ABNORMAL HIGH (ref 70–99)
HCT: 41.6 % (ref 39.0–52.0)
HDL: 54.1 mg/dL (ref >39.00)
Hemoglobin: 13.7 g/dL (ref 13.0–17.0)
LDL Cholesterol: 116 mg/dL — ABNORMAL HIGH (ref 0–99)
Lymphocytes Relative: 66.5 % — ABNORMAL HIGH (ref 12.0–46.0)
Lymphs Abs: 8.5 10*3/uL — ABNORMAL HIGH (ref 0.7–4.0)
MCHC: 32.9 g/dL (ref 30.0–36.0)
MCV: 93 fL (ref 78.0–100.0)
Monocytes Absolute: 0.8 10*3/uL (ref 0.1–1.0)
Monocytes Relative: 6.2 % (ref 3.0–12.0)
Neutro Abs: 3.4 10*3/uL (ref 1.4–7.7)
Neutrophils Relative %: 26.5 % — ABNORMAL LOW (ref 43.0–77.0)
PSA: 1.01 ng/mL (ref 0.10–4.00)
Platelets: 157 10*3/uL (ref 150.0–400.0)
Potassium: 4.7 meq/L (ref 3.5–5.1)
RBC: 4.47 M/uL (ref 4.22–5.81)
RDW: 13.6 % (ref 11.5–14.6)
Sodium: 146 meq/L — ABNORMAL HIGH (ref 135–145)
TSH: 1.01 microintl units/mL (ref 0.35–5.50)
Total Bilirubin: 1.5 mg/dL — ABNORMAL HIGH (ref 0.3–1.2)
Total CHOL/HDL Ratio: 3
Total Protein: 6.5 g/dL (ref 6.0–8.3)
Triglycerides: 69 mg/dL (ref 0.0–149.0)
VLDL: 13.8 mg/dL (ref 0.0–40.0)
Vitamin B-12: 177 pg/mL — ABNORMAL LOW (ref 211–911)
WBC: 12.8 10*3/uL — ABNORMAL HIGH (ref 4.5–10.5)

## 2010-06-01 NOTE — Assessment & Plan Note (Signed)
Summary: b12 inj/njr   Nurse Visit   Allergies: No Known Drug Allergies  Medication Administration  Injection # 1:    Medication: Vit B12 1000 mcg    Diagnosis: B12 DEFICIENCY (ICD-266.2)    Route: IM    Site: R deltoid    Exp Date: 03/02/2011    Lot #: [id number]    Mfr: American Regent    Patient tolerated injection without complications    Given by: Willy Eddy, LPN (November 05, 6642 1:34 PM)  Orders Added: 1)  Vit B12 1000 mcg [J3420] 2)  Admin of Therapeutic Inj  intramuscular or subcutaneous [03474]

## 2010-06-01 NOTE — Assessment & Plan Note (Signed)
Summary: Kevin Rogers will come in fasting/njr   Vital Signs:  Patient profile:   77 year old male Height:      72 inches Weight:      258 pounds BMI:     35.12 Temp:     98.2 degrees F oral Pulse rate:   72 / minute Resp:     14 per minute BP sitting:   130 / 80  (left arm) CC: annual visit for disease management--fasting this am   Primary Care Provider:  Stacie Glaze MD  CC:  annual visit for disease management--fasting this am.  History of Present Illness: The Kevin Rogers was asked about all immunizations, health maint. services that are appropriate to their age and was given guidance on diet exercize  and weight management he presents today in fasting stae for a welness exam  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) 3)  Back Pain, Lumbar, With Radiculopathy  (ICD-724.4) 4)  Lumbar Radiculopathy, Left  (ICD-724.4) 5)  Low Back Pain, Acute  (ICD-724.2) 6)  Erectile Dysfunction  (ICD-302.72) 7)  Acute Bronchitis  (ICD-466.0) 8)  Preventive Health Care  (ICD-V70.0) 9)  Family History Diabetes 1st Degree Relative  (ICD-V18.0) 10)  Glaucoma  (ICD-365.9) 11)  Back Pain  (ICD-724.5) 12)  Cll  (ICD-204.10) 13)  Osteoarthritis  (ICD-715.90) 14)  Hypertension  (ICD-401.9) 15)  Gout  (ICD-274.9) 16)  Diabetes Mellitus, Type II  (ICD-250.00)  Medications Prior to Update: 1)  Glucotrol 5 Mg Tabs (Glipizide) .[name].. 1 Once Daily 2)  Allopurinol 300 Mg Tabs (Allopurinol) .... Once Daily 3)  Diovan 80 Mg Tabs (Valsartan) .... Once Daily 4)  Flomax 0.4 Mg Cp24 (Tamsulosin Hcl) .... Once Daily 5)  Mobic 15 Mg  Tabs (Meloxicam) .... Once Daily 6)  Travatan 0.004 %  Soln (Travoprost) .[name].. 1 Gtt Once Daily 7)  Vicodin Es 7.5-750 Mg Tabs (Hydrocodone-Acetaminophen) .[name].. 1 By Mouth Every 6 Hours As Needed 8)  Flexeril 10 Mg Tabs (Cyclobenzaprine Hcl) .[name]..  1 By Mouth Three Times A Day 9)  Freestyle Lite Test  Strp (Glucose Blood) .[name].. 1 Once Daily Dx Code 250.00 10)  Folplex 2.2-25-1 Mg Tabs (Folic Acid-Vit B6-Vit B12) .... One By Mouth Daily 11)  Viagra 100 Mg Tabs (Sildenafil Citrate) .... Use As Directed \\par   Current Medications (verified): 1)  Glucotrol 5 Mg Tabs (Glipizide) .[name].. 1 Once Daily 2)  Allopurinol 300 Mg Tabs (Allopurinol) .... Once Daily 3)  Diovan 80 Mg Tabs (Valsartan) .... Once Daily 4)  Flomax 0.4 Mg Cp24 (Tamsulosin Hcl) .... Once Daily 5)  Mobic 15 Mg  Tabs (Meloxicam) .... Once Daily 6)  Travatan 0.004 %  Soln (Travoprost) .[name].. 1 Gtt Once Daily 7)  Freestyle Lite Test  Strp (Glucose Blood) .[name].. 1 Once Daily Dx Code 250.00 8)  Viagra 100 Mg Tabs (Sildenafil Citrate) .... Use As Directed \\par   Allergies (verified): No Known Drug Allergies  Past History:  Family History: Last updated: 2007-03-16 father died cad at 59 mother died 49 Family History Diabetes 1st degree relative  Social History: Last updated: 03-16-07 Married Former Smoker Alcohol use-no Drug use-no Regular exercise-no  Risk Factors: Exercise: no (Mar 16, 2007)  Risk Factors: Smoking Status: quit (03/24/2009)  Past medical, surgical, family and social histories (including risk factors) reviewed, and no changes noted (except as noted below).  Past Medical History: Reviewed  history from 09/12/2007 and no changes required. Diabetes mellitus, type II Gout Hypertension Osteoarthritis gluacoma  Past Surgical History: Reviewed history from 03/15/2007 and no changes required. Lumbar laminectomy Lumbar fusion Hemorrhoidectomy  Family History: Reviewed history from 03/15/2007 and no changes required. father died cad at 81 mother died 21 Family History Diabetes 1st degree relative  Social History: Reviewed history from 03/15/2007 and no changes required. Married Former Smoker Alcohol use-no Drug use-no Regular exercise-no   Review of Systems  The patient denies anorexia, fever, weight loss, weight gain, vision loss, decreased hearing, hoarseness, chest pain, syncope, dyspnea on exertion, peripheral edema, prolonged cough, headaches, hemoptysis, abdominal pain, melena, hematochezia, severe indigestion/heartburn, hematuria, incontinence, genital sores, muscle weakness, suspicious skin lesions, transient blindness, difficulty walking, depression, unusual weight change, abnormal bleeding, enlarged lymph nodes, angioedema, breast masses, and testicular masses.    Physical Exam  General:  Well-developed,well-nourished,in no acute distress; alert,appropriate and cooperative throughout examination Head:  normocephalic and male-pattern balding.   Eyes:  pupils equal and pupils round.   Ears:  R ear normal and L ear normal.   Nose:  no external deformity and no nasal discharge.   Mouth:  pharynx pink and moist and no erythema.   Neck:  supple and full ROM.   Chest Wall:  no deformities and no masses.   Lungs:  normal respiratory effort and no wheezes.   Heart:  normal rate and regular rhythm.   Abdomen:  Bowel sounds positive,abdomen soft and non-tender without masses, organomegaly or hernias noted. Prostate:  no asymmetry and 1+ enlarged.   Msk:  normal ROM, joint tenderness, and SI joint tenderness.   Extremities:  trace left pedal edema and trace right pedal edema.   Neurologic:  alert & oriented X3 and DTRs symmetrical and normal.     Impression & Recommendations:  Problem # 1:  PREVENTIVE HEALTH CARE (ICD-V70.0) The Kevin Rogers was asked about all immunizations, health maint. services that are appropriate to their age and was given guidance on diet exercize  and weight management  Orders: EKG w/ Interpretation (93000) Venipuncture (29562) TLB-BMP (Basic Metabolic Panel-BMET) (80048-METABOL) TLB-CBC Platelet - w/Differential (85025-CBCD) TLB-Lipid Panel (80061-LIPID) TLB-Hepatic/Liver Function Pnl (80076-HEPATIC)  TLB-TSH (Thyroid Stimulating Hormone) (84443-TSH) TLB-PSA (Prostate Specific Antigen) (84153-PSA)  Colonoscopy: Diverticulosis (04/09/2007) Td Booster: Historical (03/09/2007)   Flu Vax: Fluvax 3+ (02/04/2009)   Pneumovax: Pneumovax (Medicare) (03/24/2009) Chol: 176 (03/18/2008)   HDL: 59.8 (03/18/2008)   LDL: 106 (03/18/2008)   TG: 53 (03/18/2008) TSH: 1.11 (03/18/2008)   HgbA1C: 5.7 (12/12/2008)   PSA: 0.47 (03/18/2008) Next Colonoscopy due:: 04/2016 (03/24/2009)  Discussed using sunscreen, use of alcohol, drug use, self testicular exam, routine dental care, routine eye care, routine physical exam, seat belts, multiple vitamins, osteoporosis prevention, adequate calcium intake in diet, and recommendations for immunizations.  Discussed exercise and checking cholesterol.  Discussed gun safety, safe sex, and contraception. Also recommend checking PSA.  Complete Medication List: 1)  Glucotrol 5 Mg Tabs (Glipizide) .[name].. 1 once daily 2)  Allopurinol 300 Mg Tabs (Allopurinol) .... Once daily 3)  Diovan 80 Mg Tabs (Valsartan) .... Once daily 4)  Flomax 0.4 Mg Cp24 (Tamsulosin hcl) .... Once daily 5)  Mobic 15 Mg Tabs (Meloxicam) .... Once daily 6)  Travatan 0.004 % Soln (Travoprost) .[name].. 1 gtt once daily 7)  Freestyle Lite Test Strp (Glucose blood) .[name].. 1 once daily dx code 250.00 8)  Viagra 100 Mg Tabs (Sildenafil citrate) .... Use as directed \\par   Other Orders: Pneumococcal Vaccine (13086) Admin 1st  Vaccine (24401) TLB-B12 + Folate Pnl (02725_36644-I34/VQQ)  Patient Instructions: 1)  Please schedule a follow-up appointment in 3 months. Prescriptions: FLOMAX 0.4 MG CP24 (TAMSULOSIN HCL) once daily  #30 Capsule x 10   Entered by:   Willy Eddy, LPN   Authorized by:   Stacie Glaze MD   Signed by:   Willy Eddy, LPN on 59/56/3875   Method used:   [facility]        [facility]  [address] [phone]* (retail)       [address]       [place], [place]  [id number]       Ph: [phone] or [phone]       Fax: [phone]   RxID:   [id number]    Immunizations Administered:  Pneumonia Vaccine:    Vaccine Type: Pneumovax (Medicare)    Site: left deltoid    Mfr: Merck    Dose: 0.5 ml    Route: IM    Given by: Willy Eddy, LPN    Exp. Date: 04/16/2010    Lot #: [id number]    Preventive Care Screening  Colonoscopy:    Date:  04/09/2007    Next Due:  04/2016    Results:  Diverticulosis

## 2010-06-01 NOTE — Letter (Signed)
Summary: [facility]  [facility]   Imported By: Maryln Gottron 01/18/2008 15:56:42  _____________________________________________________________________  External Attachment:    Type:   Image     Comment:   External Document

## 2010-06-01 NOTE — Progress Notes (Signed)
   Phone Note From Pharmacy   [facility]  [address] [phone]* Call For: Lovell Sheehan  Summary of Call: pharmacy received a rx for glipizide 5 mg pt says he suppose to be on er release. pl give a call back to verify the script [phone] Initial call taken by: Shan Levans,  January 23, 2007 11:53 AM  Follow-up for Phone Call        go on er and pharmacy informed

## 2010-06-01 NOTE — Assessment & Plan Note (Signed)
Summary: flu shot/njr   Nurse Visit Flu Vaccine Consent Questions     Do you have a history of severe allergic reactions to this vaccine? no    Any prior history of allergic reactions to egg and/or gelatin? no    Do you have a sensitivity to the preservative Thimersol? no    Do you have a past history of Guillan-Barre Syndrome? no    Do you currently have an acute febrile illness? no    Have you ever had a severe reaction to latex? no    Vaccine information given and explained to patient? yes    Are you currently pregnant? no    Lot Number:[id number]   Exp Date:10/29/2009   Site Given  Left Deltoid IM    Allergies: No Known Drug Allergies  Orders Added: 1)  Admin 1st Vaccine [90471] 2)  Flu Vaccine 42yrs + [16109]

## 2010-06-01 NOTE — Assessment & Plan Note (Signed)
Summary: 6 month rov/njr   Vital Signs:  Patient profile:   77 year old male Height:      72 inches Weight:      246 pounds BMI:     33.48 Temp:     98.2 degrees F oral Pulse rate:   76 / minute Resp:     14 per minute BP sitting:   120 / 80  (left arm)  Vitals Entered By: Willy Eddy, LPN (Sep 11, 2008 8:12 AM)  Primary Care Provider:  Stacie Glaze MD  CC:  roa.  History of Present Illness: had lost at least 6-7 pound with doiet and exercize the pt was seen with back pain and was given steroid and the CBG's have been elevated with Am glucoses reading 140's highs to 100 lows had an attack on gout in the left great toe   Hypertension History:      He denies headache, chest pain, palpitations, dyspnea with exertion, orthopnea, PND, peripheral edema, visual symptoms, neurologic problems, syncope, and side effects from treatment.        Positive major cardiovascular risk factors include male age 66 years old or older, diabetes, and hypertension.  Negative major cardiovascular risk factors include non-tobacco-user status.     Problems Prior to Update: 1)  Lumbar Radiculopathy, Left  (ICD-724.4) 2)  Low Back Pain, Acute  (ICD-724.2) 3)  Erectile Dysfunction  (ICD-302.72) 4)  Acute Bronchitis  (ICD-466.0) 5)  Preventive Health Care  (ICD-V70.0) 6)  Family History Diabetes 1st Degree Relative  (ICD-V18.0) 7)  Glaucoma  (ICD-365.9) 8)  Back Pain  (ICD-724.5) 9)  Cll  (ICD-204.10) 10)  Osteoarthritis  (ICD-715.90) 11)  Hypertension  (ICD-401.9) 12)  Gout  (ICD-274.9) 13)  Diabetes Mellitus, Type II  (ICD-250.00)  Medications Prior to Update: 1)  Glipizide 5 Mg  Tabs (Glipizide) .... Once Daily 2)  Allopurinol 300 Mg Tabs (Allopurinol) .... Once Daily 3)  Diovan 80 Mg Tabs (Valsartan) .... Once Daily 4)  Flomax 0.4 Mg Cp24 (Tamsulosin Hcl) .... Once Daily 5)  Mobic 15 Mg  Tabs (Meloxicam) .... Once Daily 6)  Travatan 0.004 %  Soln (Travoprost) .[name].. 1 Gtt Once Daily  7)  Vicodin Es 7.5-750 Mg Tabs (Hydrocodone-Acetaminophen) .[name].. 1 By Mouth Every 6 Hours As Needed 8)  Flexeril 10 Mg Tabs (Cyclobenzaprine Hcl) .[name].. 1 By Mouth Three Times A Day 9)  Prednisone 20 Mg Tabs (Prednisone) .... One By Mouth Three Times A Day For 4days Then One By Mouth Two Times A Day For 4 Days Then One Daily For 4days The 1/2 Daily For 4 Days 10)  Freestyle Lite Test  Strp (Glucose Blood) .[name].. 1 Once Daily Dx Code 250.00  Current Medications (verified): 1)  Glipizide-Metformin Hcl 2.5-500 Mg Tabs (Glipizide-Metformin Hcl) .... One By Mouth Daily 2)  Allopurinol 300 Mg Tabs (Allopurinol) .... Once Daily 3)  Diovan 80 Mg Tabs (Valsartan) .... Once Daily 4)  Flomax 0.4 Mg Cp24 (Tamsulosin Hcl) .... Once Daily 5)  Mobic 15 Mg  Tabs (Meloxicam) .... Once Daily 6)  Travatan 0.004 %  Soln (Travoprost) .[name].. 1 Gtt Once Daily 7)  Vicodin Es 7.5-750 Mg Tabs (Hydrocodone-Acetaminophen) .[name].. 1 By Mouth Every 6 Hours As Needed 8)  Flexeril 10 Mg Tabs (Cyclobenzaprine Hcl) .[name].. 1 By Mouth Three Times A Day 9)  Freestyle Lite Test  Strp (Glucose Blood) .[name].. 1 Once Daily Dx Code 250.00  Allergies (verified): No Known Drug Allergies  Past History:  Family History:  father died cad at 90    mother died 57    Family History Diabetes 1st degree relative     (03/15/2007)  Social History:    Married    Former Smoker    Alcohol use-no    Drug use-no    Regular exercise-no     (03/15/2007)  Risk Factors:    Alcohol Use: N/A    >5 drinks/d w/in last 3 months: N/A    Caffeine Use: N/A    Diet: N/A    Exercise: no (03/15/2007)  Risk Factors:    Smoking Status: quit (03/15/2007)    Packs/Day: N/A    Cigars/wk: N/A    Pipe Use/wk: N/A    Cans of tobacco/wk: N/A    Passive Smoke Exposure: N/A  Past medical, surgical, family and social histories (including risk factors) reviewed, and no changes noted (except as noted below).  Past Medical History:    Reviewed history from  09/12/2007 and no changes required:    Diabetes mellitus, type II    Gout    Hypertension    Osteoarthritis    gluacoma  Past Surgical History:    Reviewed history from 03/15/2007 and no changes required:    Lumbar laminectomy    Lumbar fusion    Hemorrhoidectomy  Family History:    Reviewed history from 03/15/2007 and no changes required:       father died cad at 30       mother died 63       Family History Diabetes 1st degree relative  Social History:    Reviewed history from 03/15/2007 and no changes required:       Married       Former Smoker       Alcohol use-no       Drug use-no       Regular exercise-no  Review of Systems  The patient denies anorexia, fever, weight loss, weight gain, vision loss, decreased hearing, hoarseness, chest pain, syncope, dyspnea on exertion, peripheral edema, prolonged cough, headaches, hemoptysis, abdominal pain, melena, hematochezia, severe indigestion/heartburn, hematuria, incontinence, genital sores, muscle weakness, suspicious skin lesions, transient blindness, difficulty walking, depression, unusual weight change, abnormal bleeding, enlarged lymph nodes, angioedema, and breast masses.    Physical Exam  General:  Well-developed,well-nourished,in no acute distress; alert,appropriate and cooperative throughout examination Head:  male-pattern balding.  atraumatic.   Ears:  R ear normal and L ear normal.   Nose:  no external deformity.  no nasal discharge.   Mouth:  pharynx pink and moist.   Neck:  No deformities, masses, or tenderness noted. Lungs:  no intercostal retractions.  normal respiratory effort and no crackles.   Heart:  normal rate and regular rhythm.   Abdomen:  soft and normal bowel sounds.   Msk:  gout changes in left great toe  Diabetes Management Exam:    Foot Exam (with socks and/or shoes not present):       Sensory-Pinprick/Light touch:          Left medial foot (L-4): normal          Left dorsal foot (L-5): normal           Left lateral foot (S-1): normal          Right medial foot (L-4): normal          Right dorsal foot (L-5): normal          Right lateral foot (S-1): normal  Sensory-Monofilament:          Left foot: normal          Right foot: normal       Inspection:          Left foot: normal          Right foot: normal       Nails:          Left foot: normal          Right foot: normal    Eye Exam:       Eye Exam done elsewhere          Date: 08/26/2008          Results: normal          Done by: holander    Impression & Recommendations:  Problem # 1:  DIABETES MELLITUS, TYPE II (ICD-250.00)  His updated medication list for this problem includes:    Glipizide-metformin Hcl 2.5-500 Mg Tabs (Glipizide-metformin hcl) ..... One by mouth daily    Diovan 80 Mg Tabs (Valsartan) ..... Once daily  Orders: TLB-A1C / Hgb A1C (Glycohemoglobin) (83036-A1C)  Labs Reviewed: Creat: 1.3 (08/04/2008)    Reviewed HgBA1c results: 5.9 (08/04/2008)  5.6 (03/18/2008)  Problem # 2:  GOUT (ICD-274.9)  His updated medication list for this problem includes:    Allopurinol 300 Mg Tabs (Allopurinol) ..... Once daily  Elevate extremity; warm compresses, symptomatic relief and medication as directed.   Orders: TLB-Uric Acid, Blood (84550-URIC)  Problem # 3:  CLL (ICD-204.10) has been  stable this lab is Orders: Venipuncture (98119) TLB-CBC Platelet - w/Differential (85025-CBCD)  Problem # 4:  HYPERTENSION (ICD-401.9)  His updated medication list for this problem includes:    Diovan 80 Mg Tabs (Valsartan) ..... Once daily  BP today: 120/80 Prior BP: 120/72 (08/04/2008)  Prior 10 Yr Risk Heart Disease: 27 % (08/04/2008)  Labs Reviewed: K+: 4.3 (08/04/2008) Creat: : 1.3 (08/04/2008)   Chol: 176 (03/18/2008)   HDL: 59.8 (03/18/2008)   LDL: 106 (03/18/2008)   TG: 53 (03/18/2008)  Complete Medication List: 1)  Glipizide-metformin Hcl 2.5-500 Mg Tabs (Glipizide-metformin hcl) .... One by  mouth daily 2)  Allopurinol 300 Mg Tabs (Allopurinol) .... Once daily 3)  Diovan 80 Mg Tabs (Valsartan) .... Once daily 4)  Flomax 0.4 Mg Cp24 (Tamsulosin hcl) .... Once daily 5)  Mobic 15 Mg Tabs (Meloxicam) .... Once daily 6)  Travatan 0.004 % Soln (Travoprost) .[name].. 1 gtt once daily 7)  Vicodin Es 7.5-750 Mg Tabs (Hydrocodone-acetaminophen) .[name].. 1 by mouth every 6 hours as needed 8)  Flexeril 10 Mg Tabs (Cyclobenzaprine hcl) .[name].. 1 by mouth three times a day 9)  Freestyle Lite Test Strp (Glucose blood) .[name].. 1 once daily dx code 250.00  Hypertension Assessment/Plan:      The patient's hypertensive risk group is category C: Target organ damage and/or diabetes.  His calculated 10 year risk of coronary heart disease is 27 %.  Today's blood pressure is 120/80.  His blood pressure goal is < 130/80.  Patient Instructions: 1)  Please schedule a follow-up appointment in 2 months. Prescriptions: GLIPIZIDE-METFORMIN HCL 2.5-500 MG TABS (GLIPIZIDE-METFORMIN HCL) one by mouth daily  #30 x 11   Entered and Authorized by:   Stacie Glaze MD   Signed by:   Stacie Glaze MD on 09/11/2008   Method used:   [facility]        [facility]  [address] [phone][facility] ([facility]ail[facility]  [address]       [place], [place]  [id number]       Ph: [phone] or [phone]       Fax: [phone]   RxID:   [id number]

## 2010-06-01 NOTE — Assessment & Plan Note (Signed)
Summary: b12 inj/njr   Nurse Visit   Allergies: No Known Drug Allergies  Medication Administration  Injection # 1:    Medication: Vit B12 1000 mcg    Diagnosis: B12 DEFICIENCY (ICD-266.2)    Route: IM    Site: L deltoid    Exp Date: 01/29/2011    Lot #: [id number]    Mfr: [employer] Regent    Patient tolerated injection without complications    Given by: Willy Eddy, LPN (July 27, 2009 12:14 PM)  Orders Added: 1)  Admin of Therapeutic Inj  intramuscular or subcutaneous [53664]

## 2010-06-01 NOTE — Assessment & Plan Note (Signed)
Summary: 6 month rov/njr   Vital Signs:  Patient Profile:   77 Years Old Male Height:     74 inches Weight:      248 pounds Temp:     98.5 degrees F oral Pulse rate:   76 / minute Resp:     14 per minute BP sitting:   140 / 80  (left arm)  Vitals Entered By: Willy Eddy, LPN (March 18, 2008 8:11 AM)                 Chief Complaint:  roa.  History of Present Illness: ED/ CPX  Follow-Up Visit      This is a 77 year old man who presents for Follow-up visit.  CPX.  The patient denies chest pain, palpitations, dizziness, syncope, low blood sugar symptoms, high blood sugar symptoms, edema, SOB, DOE, PND, and orthopnea.  Since the last visit the patient notes no new problems or concerns.  The patient reports taking meds as prescribed, monitoring BP, and dietary compliance.  When questioned about possible medication side effects, the patient notes none.      Prior Medication List:  GLIPIZIDE 5 MG  TABS (GLIPIZIDE) once daily ALLOPURINOL 300 MG TABS (ALLOPURINOL) once daily DIOVAN 80 MG TABS (VALSARTAN) once daily FLOMAX 0.4 MG CP24 (TAMSULOSIN HCL) once daily MOBIC 15 MG  TABS (MELOXICAM) once daily TRAVATAN 0.004 %  SOLN (TRAVOPROST) 1 gtt once daily MAXICHLOR PEH DM 02-03-19 MG  TABS (PHENYLEPHRINE-CHLORPHEN-DM) one by mouth q 6 hours   Current Allergies (reviewed today): No known allergies   Past Medical History:    Reviewed history from 09/12/2007 and no changes required:       Diabetes mellitus, type II       Gout       Hypertension       Osteoarthritis       gluacoma  Past Surgical History:    Reviewed history from 03/15/2007 and no changes required:       Lumbar laminectomy       Lumbar fusion       Hemorrhoidectomy   Family History:    Reviewed history from 03/15/2007 and no changes required:       father died cad at 72       mother died 60       Family History Diabetes 1st degree relative  Social History:    Reviewed history from 03/15/2007  and no changes required:       Married       Former Smoker       Alcohol use-no       Drug use-no       Regular exercise-no    Review of Systems  The patient denies anorexia, fever, weight loss, weight gain, vision loss, decreased hearing, hoarseness, chest pain, syncope, dyspnea on exertion, peripheral edema, prolonged cough, headaches, hemoptysis, abdominal pain, melena, hematochezia, severe indigestion/heartburn, hematuria, incontinence, genital sores, muscle weakness, suspicious skin lesions, transient blindness, difficulty walking, depression, unusual weight change, abnormal bleeding, enlarged lymph nodes, angioedema, and breast masses.     Physical Exam  General:     Well-developed,well-nourished,in no acute distress; alert,appropriate and cooperative throughout examination Head:     male-pattern balding.  atraumatic.   Eyes:     pupils equal and pupils round.   Ears:     R ear normal and L ear normal.   Nose:     no external deformity.  no nasal discharge.   Mouth:  pharynx pink and moist.   Neck:     No deformities, masses, or tenderness noted. Lungs:     no intercostal retractions.  normal respiratory effort and no crackles.   Heart:     normal rate and regular rhythm.   Abdomen:     soft and normal bowel sounds.   Prostate:     no nodules, no asymmetry, and 1+ enlarged.   Msk:     normal ROM, no joint swelling, and no joint warmth.   Extremities:     trace left pedal edema and trace right pedal edema.   Neurologic:     alert & oriented X3 and gait normal.   Skin:     Intact without suspicious lesions or rashes Cervical Nodes:     No lymphadenopathy noted Axillary Nodes:     No palpable lymphadenopathy Psych:     Cognition and judgment appear intact. Alert and cooperative with normal attention span and concentration. No apparent delusions, illusions, hallucinations    Impression & Recommendations:  Problem # 1:  PREVENTIVE HEALTH CARE (ICD-V70.0)   Reviewed preventive care protocols, scheduled due services, and updated immunizations.  Orders: Venipuncture (81191) TLB-CBC Platelet - w/Differential (85025-CBCD) TLB-BMP (Basic Metabolic Panel-BMET) (80048-METABOL) TLB-Hepatic/Liver Function Pnl (80076-HEPATIC) TLB-TSH (Thyroid Stimulating Hormone) (84443-TSH) TLB-Lipid Panel (80061-LIPID) TLB-PSA (Prostate Specific Antigen) (84153-PSA)   Problem # 2:  HYPERTENSION (ICD-401.9) reveiwed diet and salt restrictions and weight lossgoals His updated medication list for this problem includes:    Diovan 80 Mg Tabs (Valsartan) ..... Once daily  BP today: 140/80 Prior BP: 130/80 (09/12/2007)  Prior 10 Yr Risk Heart Disease: 40 % (09/12/2007)  Labs Reviewed: Creat: 1.4 (09/12/2007) Chol: 190 (03/08/2007)   HDL: 41.3 (03/08/2007)   LDL: 127 (03/08/2007)   TG: 110 (03/08/2007)   Problem # 3:  CLL (ICD-204.10) monitering of  CBC  Problem # 4:  HYPERTENSION (ICD-401.9)  His updated medication list for this problem includes:    Diovan 80 Mg Tabs (Valsartan) ..... Once daily  BP today: 140/80 Prior BP: 130/80 (09/12/2007)  Prior 10 Yr Risk Heart Disease: 40 % (09/12/2007)  Labs Reviewed: Creat: 1.4 (09/12/2007) Chol: 190 (03/08/2007)   HDL: 41.3 (03/08/2007)   LDL: 127 (03/08/2007)   TG: 110 (03/08/2007)   Problem # 5:  GOUT (ICD-274.9)  His updated medication list for this problem includes:    Allopurinol 300 Mg Tabs (Allopurinol) ..... Once daily    Mobic 15 Mg Tabs (Meloxicam) ..... Once daily  Orders: TLB-Uric Acid, Blood (84550-URIC) Elevate extremity; warm compresses, symptomatic relief and medication as directed.   Problem # 6:  DIABETES MELLITUS, TYPE II (ICD-250.00)  His updated medication list for this problem includes:    Glipizide 5 Mg Tabs (Glipizide) ..... Once daily    Diovan 80 Mg Tabs (Valsartan) ..... Once daily  Orders: TLB-A1C / Hgb A1C (Glycohemoglobin) (83036-A1C)  Labs Reviewed: HgBA1c: 6.1  (09/12/2007)   Creat: 1.4 (09/12/2007)   Microalbumin: 2.5 (09/12/2007)   Problem # 7:  ERECTILE DYSFUNCTION (ICD-302.72) Discussed proper use of medications, as well as side effects.   trial of medications.  Complete Medication List: 1)  Glipizide 5 Mg Tabs (Glipizide) .... Once daily 2)  Allopurinol 300 Mg Tabs (Allopurinol) .... Once daily 3)  Diovan 80 Mg Tabs (Valsartan) .... Once daily 4)  Flomax 0.4 Mg Cp24 (Tamsulosin hcl) .... Once daily 5)  Mobic 15 Mg Tabs (Meloxicam) .... Once daily 6)  Travatan 0.004 % Soln (Travoprost) .[name].. 1 gtt once daily  Patient Instructions: 1)  Please schedule a follow-up appointment in 6 months.   ]

## 2010-06-01 NOTE — Progress Notes (Signed)
Summary: needs new machine   Phone Note Call from Patient Call back at Home Phone [phone]   Caller: pt live Call For: Dorothie Wah Reason for Call: Acute Illness Summary of Call: his blood sugar testing machine is an antique.  Does he need an rx for a new one.   Initial call taken by: Roselle Locus,  August 22, 2008 11:39 AM  Follow-up for Phone Call        will check with pharmacy to see if his insurance will pay for our glucometers strips and pt will let me know if ihe wants one Follow-up by: Willy Eddy, LPN,  August 22, 2008 12:02 PM

## 2010-06-01 NOTE — Assessment & Plan Note (Signed)
Summary: 2 MONTH ROV/NJR/PT RESCD FROM BUMP//CCM   Vital Signs:  Patient profile:   77 year old male Height:      72 inches Weight:      254 pounds BMI:     34.57 Temp:     98.2 degrees F oral Pulse rate:   72 / minute Resp:     14 per minute BP sitting:   120 / 70  (left arm)  Vitals Entered By: Willy Eddy, LPN (December 12, 2008 11:40 AM)  Primary Care Provider:  Stacie Glaze MD  CC:  roa- changed glipizide /metformin back to glipizide 5 due to lowering bs too much.  History of Present Illness: PERSISTANT NUMBNESS IN GREAT TOE in left foot uric acid levels normal has increased pain and fatigue in leg with walking in the left leg DM CBG's are stable hx of CLL  ( [place] one time a year)   Hypertension History:      He denies headache, chest pain, palpitations, dyspnea with exertion, orthopnea, PND, peripheral edema, visual symptoms, neurologic problems, syncope, and side effects from treatment.        Positive major cardiovascular risk factors include male age 42 years old or older, diabetes, and hypertension.  Negative major cardiovascular risk factors include non-tobacco-user status.     Problems Prior to Update: 1)  Lumbar Radiculopathy, Left  (ICD-724.4) 2)  Low Back Pain, Acute  (ICD-724.2) 3)  Erectile Dysfunction  (ICD-302.72) 4)  Acute Bronchitis  (ICD-466.0) 5)  Preventive Health Care  (ICD-V70.0) 6)  Family History Diabetes 1st Degree Relative  (ICD-V18.0) 7)  Glaucoma  (ICD-365.9) 8)  Back Pain  (ICD-724.5) 9)  Cll  (ICD-204.10) 10)  Osteoarthritis  (ICD-715.90) 11)  Hypertension  (ICD-401.9) 12)  Gout  (ICD-274.9) 13)  Diabetes Mellitus, Type II  (ICD-250.00)  Medications Prior to Update: 1)  Glipizide-Metformin Hcl 2.5-500 Mg Tabs (Glipizide-Metformin Hcl) .... One By Mouth Daily 2)  Allopurinol 300 Mg Tabs (Allopurinol) .... Once Daily 3)  Diovan 80 Mg Tabs (Valsartan) .... Once Daily 4)  Flomax 0.4 Mg Cp24 (Tamsulosin Hcl) .... Once Daily  5)  Mobic 15 Mg  Tabs (Meloxicam) .... Once Daily 6)  Travatan 0.004 %  Soln (Travoprost) .[name].. 1 Gtt Once Daily 7)  Vicodin Es 7.5-750 Mg Tabs (Hydrocodone-Acetaminophen) .[name].. 1 By Mouth Every 6 Hours As Needed 8)  Flexeril 10 Mg Tabs (Cyclobenzaprine Hcl) .[name].. 1 By Mouth Three Times A Day 9)  Freestyle Lite Test  Strp (Glucose Blood) .[name].. 1 Once Daily Dx Code 250.00  Current Medications (verified): 1)  Glucotrol 5 Mg Tabs (Glipizide) .[name].. 1 Once Daily 2)  Allopurinol 300 Mg Tabs (Allopurinol) .... Once Daily 3)  Diovan 80 Mg Tabs (Valsartan) .... Once Daily 4)  Flomax 0.4 Mg Cp24 (Tamsulosin Hcl) .... Once Daily 5)  Mobic 15 Mg  Tabs (Meloxicam) .... Once Daily 6)  Travatan 0.004 %  Soln (Travoprost) .[name].. 1 Gtt Once Daily 7)  Vicodin Es 7.5-750 Mg Tabs (Hydrocodone-Acetaminophen) .[name].. 1 By Mouth Every 6 Hours As Needed 8)  Flexeril 10 Mg Tabs (Cyclobenzaprine Hcl) .[name].. 1 By Mouth Three Times A Day 9)  Freestyle Lite Test  Strp (Glucose Blood) .[name].. 1 Once Daily Dx Code 250.00 10)  Folplex 2.2-25-1 Mg Tabs (Folic Acid-Vit B6-Vit B12) .... One By Mouth Daily  Allergies (verified): No Known Drug Allergies  Past History:  Family History: Last updated: 04-09-07 father died cad at 63 mother died 88 Family History Diabetes 1st  degree relative  Social History: Last updated: 03/15/2007 Married Former Smoker Alcohol use-no Drug use-no Regular exercise-no  Risk Factors: Exercise: no (03/15/2007)  Risk Factors: Smoking Status: quit (03/15/2007)  Past medical, surgical, family and social histories (including risk factors) reviewed, and no changes noted (except as noted below).  Past Medical History: Reviewed history from 09/12/2007 and no changes required. Diabetes mellitus, type II Gout Hypertension Osteoarthritis gluacoma  Past Surgical History: Reviewed history from 03/15/2007 and no changes required. Lumbar laminectomy Lumbar fusion Hemorrhoidectomy  Family  History: Reviewed history from 03/15/2007 and no changes required. father died cad at 45 mother died 55 Family History Diabetes 1st degree relative  Social History: Reviewed history from 03/15/2007 and no changes required. Married Former Smoker Alcohol use-no Drug use-no Regular exercise-no  Review of Systems  The patient denies anorexia, fever, weight loss, weight gain, vision loss, decreased hearing, hoarseness, chest pain, syncope, dyspnea on exertion, peripheral edema, prolonged cough, headaches, hemoptysis, abdominal pain, melena, hematochezia, severe indigestion/heartburn, hematuria, incontinence, genital sores, muscle weakness, suspicious skin lesions, transient blindness, difficulty walking, depression, unusual weight change, abnormal bleeding, enlarged lymph nodes, angioedema, and breast masses.         gate disorder  Physical Exam  General:  Well-developed,well-nourished,in no acute distress; alert,appropriate and cooperative throughout examination Head:  normocephalic and male-pattern balding.   Eyes:  pupils equal and pupils round.   Ears:  R ear normal and L ear normal.   Mouth:  pharynx pink and moist and no erythema.   Lungs:  normal respiratory effort and no wheezes.   Heart:  normal rate and regular rhythm.   Neurologic:  DTRs symmetrical and normal.  Pt walking with cane.   + slr on Left decreased hip flexion on L strength otherwise normal   Impression & Recommendations:  Problem # 1:  BACK PAIN, LUMBAR, WITH RADICULOPATHY (ICD-724.4)  MRI was reveiwed and the consult wit the orthopedist noted  His updated medication list for this problem includes:    Mobic 15 Mg Tabs (Meloxicam) ..... Once daily    Vicodin Es 7.5-750 Mg Tabs (Hydrocodone-acetaminophen) .[name]... 1 by mouth every 6 hours as needed    Flexeril 10 Mg Tabs (Cyclobenzaprine hcl) .[name]... 1 by mouth three times a day  Discussed use of moist heat or ice, modified activities, medications, and  stretching/strengthening exercises. Back care instructions given. To be seen in 2 weeks if no improvement; sooner if worsening of symptoms.   Problem # 2:  POLYNEUROPATHY IN DIABETES (ICD-357.2)  folbee tabs daily for neuropathy  Orders: Venipuncture (82956) TLB-B12 + Folate Pnl (21308_65784-O96/EXB)  Problem # 3:  DIABETES MELLITUS, TYPE II (ICD-250.00)  His updated medication list for this problem includes:    Glucotrol 5 Mg Tabs (Glipizide) .[name]... 1 once daily    Diovan 80 Mg Tabs (Valsartan) ..... Once daily  Orders: TLB-A1C / Hgb A1C (Glycohemoglobin) (83036-A1C)  Labs Reviewed: Creat: 1.3 (08/04/2008)     Last Eye Exam: normal (08/26/2008) Reviewed HgBA1c results: 6.4 (09/11/2008)  5.9 (08/04/2008)  Complete Medication List: 1)  Glucotrol 5 Mg Tabs (Glipizide) .[name].. 1 once daily 2)  Allopurinol 300 Mg Tabs (Allopurinol) .... Once daily 3)  Diovan 80 Mg Tabs (Valsartan) .... Once daily 4)  Flomax 0.4 Mg Cp24 (Tamsulosin hcl) .... Once daily 5)  Mobic 15 Mg Tabs (Meloxicam) .... Once daily 6)  Travatan 0.004 % Soln (Travoprost) .[name].. 1 gtt once daily 7)  Vicodin Es 7.5-750 Mg Tabs (Hydrocodone-acetaminophen) .[name].. 1 by mouth every 6 hours  as needed 8)  Flexeril 10 Mg Tabs (Cyclobenzaprine hcl) .[name].. 1 by mouth three times a day 9)  Freestyle Lite Test Strp (Glucose blood) .[name].. 1 once daily dx code 250.00 10)  Folplex 2.2-25-1 Mg Tabs (Folic acid-vit b6-vit b12) .... One by mouth daily  Other Orders: TLB-BMP (Basic Metabolic Panel-BMET) (80048-METABOL) TLB-CBC Platelet - w/Differential (85025-CBCD)  Hypertension Assessment/Plan:      The patient's hypertensive risk group is category C: Target organ damage and/or diabetes.  His calculated 10 year risk of coronary heart disease is 27 %.  Today's blood pressure is 120/70.  His blood pressure goal is < 130/80.  Patient Instructions: 1)  Please schedule a follow-up appointment in 3 months. Prescriptions: FOLPLEX 2.2-25-1 MG  TABS (FOLIC ACID-VIT B6-VIT B12) one by mouth daily  #30 x 11   Entered and Authorized by:   Stacie Glaze MD   Signed by:   Stacie Glaze MD on 12/12/2008   Method used:   Electronically to        [facility]  [address] [phone]* (retail)       [address]       [place], [place]  [id number]       Ph: [phone] or [phone]       Fax: [phone]   RxID:   [id number]   Appended Document: Orders Update     Clinical Lists Changes  Problems: Added new problem of B12 DEFICIENCY (ICD-266.2) Orders: Added new Service order of Vit B12 1000 mcg (Z6606) - Signed Added new Service order of Admin of Therapeutic Inj  intramuscular or subcutaneous (30160) - Signed       Medication Administration  Injection # 1:    Medication: Vit B12 1000 mcg    Diagnosis: POLYNEUROPATHY IN DIABETES (ICD-357.2)    Route: IM    Site: L deltoid    Exp Date: 06/30/2010    Lot #: [id number]    Mfr: [employer]    Patient tolerated injection without complications    Given by: Willy Eddy, LPN (December 12, 2008 12:39 PM)  Orders Added: 1)  Vit B12 1000 mcg [J3420] 2)  Admin of Therapeutic Inj  intramuscular or subcutaneous [10932]

## 2010-06-01 NOTE — Assessment & Plan Note (Signed)
Summary: b-12 inj and flu shot/cjr   Nurse Visit   Allergies: No Known Drug Allergies  Medication Administration  Injection # 1:    Medication: Vit B12 1000 mcg    Diagnosis: B12 DEFICIENCY (ICD-266.2)    Route: SQ    Site: L deltoid    Exp Date: 08/02/2011    Lot #: [id number]    Mfr: [employer]    Patient tolerated injection without complications    Given by: Lynann Beaver CMA (February 01, 2010 12:25 PM)  Orders Added: 1)  Vit B12 1000 mcg [J3420] 2)  Admin of Therapeutic Inj  intramuscular or subcutaneous [[id number]]  Appended Document: b-12 inj and flu shot/cjr     Clinical Lists Changes  Orders: Added new Service order of Flu Vaccine 53yrs + MEDICARE PATIENTS (M5784) - Signed Added new Service order of Administration Flu vaccine - MCR (O9629) - Signed Observations: Added new observation of FLU VAX VIS: 11/24/2009 version (02/01/2010 12:33) Added new observation of FLU VAXLOT: [id number] (02/01/2010 12:33) Added new observation of FLU VAXMFR: Glaxosmithkline (02/01/2010 12:33) Added new observation of FLU VAX EXP: 10/30/2010 (02/01/2010 12:33) Added new observation of FLU VAX DSE: 0.75ml (02/01/2010 12:33) Added new observation of FLU VAX: Fluvax 3+ (02/01/2010 12:33)Flu Vaccine Consent Questions     Do you have a history of severe allergic reactions to this vaccine? no    Any prior history of allergic reactions to egg and/or gelatin? no    Do you have a sensitivity to the preservative Thimersol? no    Do you have a past history of Guillan-Barre Syndrome? no    Do you currently have an acute febrile illness? no    Have you ever had a severe reaction to latex? no    Vaccine information given and explained to patient? yes    Are you currently pregnant? no    Lot Number:[id number]   Exp Date:10/30/2010   Site Given  Left Deltoid IMbservation of FLU VAX VIS: 11/24/2009 version (02/01/2010 12:33) Added new observation of FLU VAXLOT: [id number] (02/01/2010  12:33) Added new observation of FLU VAXMFR: Glaxosmithkline (02/01/2010 12:33) Added new observation of FLU VAX EXP: 10/30/2010 (02/01/2010 12:33) Added new observation of FLU VAX DSE: 0.21ml (02/01/2010 12:33) Added new observation of FLU VAX: Fluvax 3+ (02/01/2010 12:33)  .lbmedflu

## 2010-06-01 NOTE — Assessment & Plan Note (Signed)
Summary: B-12 INJ/CJR   Nurse Visit   Allergies: No Known Drug Allergies  Medication Administration  Injection # 1:    Medication: Vit B12 1000 mcg    Diagnosis: B12 DEFICIENCY (ICD-266.2)    Route: IM    Site: L deltoid    Exp Date: 01/29/2011    Lot #: [id number]    Mfr: [employer]    Patient tolerated injection without complications    Given by: Willy Eddy, LPN (Sep 01, 1608 12:46 PM)  Orders Added: 1)  Vit B12 1000 mcg [J3420] 2)  Admin of Therapeutic Inj  intramuscular or subcutaneous [[id number]]

## 2010-06-01 NOTE — Letter (Signed)
Summary: [facility]  [facility]   Imported By: Maryln Gottron 01/15/2009 14:01:16  _____________________________________________________________________  External Attachment:    Type:   Image     Comment:   External Document

## 2010-06-01 NOTE — Letter (Signed)
Summary: [facility]  [facility]   Imported By: Kassie Mends 01/23/2007 12:39:41  _____________________________________________________________________  External Attachment:    Type:   Image     Comment:   [facility]

## 2010-06-01 NOTE — Procedures (Signed)
Summary: colonoscopy  colonoscopy   Imported By: Kassie Mends 04/24/2007 14:34:48  _____________________________________________________________________  External Attachment:    Type:   Image     Comment:   colonoscopy

## 2010-06-01 NOTE — Assessment & Plan Note (Signed)
Summary: 3 month rov/njr   Vital Signs:  Patient profile:   77 year old male Height:      72 inches Weight:      260 pounds BMI:     35.39 Temp:     97.2 degrees F oral Pulse rate:   68 / minute Resp:     14 per minute BP sitting:   112 / 70  (left arm)  Vitals Entered By: Willy Eddy, LPN (June 29, 2009 8:22 AM) CC: roa, Hypertension Management, Type 2 diabetes mellitus follow-up   Primary Care Provider:  Stacie Glaze MD  CC:  roa, Hypertension Management, and Type 2 diabetes mellitus follow-up.  History of Present Illness: follow up for HTN and DM  Type 2 Diabetes Mellitus Follow-Up      This is a 77 year old man who presents for Type 2 diabetes mellitus follow-up.  The patient denies polyuria, polydipsia, blurred vision, self managed hypoglycemia, hypoglycemia requiring help, weight loss, weight gain, and numbness of extremities.  The patient denies the following symptoms: neuropathic pain, chest pain, vomiting, orthostatic symptoms, poor wound healing, intermittent claudication, vision loss, and foot ulcer.  Since the last visit the patient reports good dietary compliance.  The patient has been measuring capillary blood glucose before breakfast.    Hypertension History:      He denies headache, chest pain, palpitations, dyspnea with exertion, orthopnea, PND, peripheral edema, visual symptoms, neurologic problems, syncope, and side effects from treatment.        Positive major cardiovascular risk factors include male age 75 years old or older, diabetes, and hypertension.  Negative major cardiovascular risk factors include non-tobacco-user status.     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) 3)  Back Pain, Lumbar, With Radiculopathy  (ICD-724.4) 4)  Lumbar Radiculopathy, Left  (ICD-724.4) 5)  Low Back Pain, Acute   (ICD-724.2) 6)  Erectile Dysfunction  (ICD-302.72) 7)  Acute Bronchitis  (ICD-466.0) 8)  Preventive Health Care  (ICD-V70.0) 9)  Family History Diabetes 1st Degree Relative  (ICD-V18.0) 10)  Glaucoma  (ICD-365.9) 11)  Back Pain  (ICD-724.5) 12)  Cll  (ICD-204.10) 13)  Osteoarthritis  (ICD-715.90) 14)  Hypertension  (ICD-401.9) 15)  Gout  (ICD-274.9) 16)  Diabetes Mellitus, Type II  (ICD-250.00)  Current Problems (verified): 1)  B12 Deficiency  (ICD-266.2) 2)  Polyneuropathy in Diabetes  (ICD-357.2) 3)  Back Pain, Lumbar, With Radiculopathy  (ICD-724.4) 4)  Lumbar Radiculopathy, Left  (ICD-724.4) 5)  Low Back Pain, Acute  (ICD-724.2) 6)  Erectile Dysfunction  (ICD-302.72) 7)  Acute Bronchitis  (ICD-466.0) 8)  Preventive Health Care  (ICD-V70.0) 9)  Family History Diabetes 1st Degree Relative  (ICD-V18.0) 10)  Glaucoma  (ICD-365.9) 11)  Back Pain  (ICD-724.5) 12)  Cll  (ICD-204.10) 13)  Osteoarthritis  (ICD-715.90) 14)  Hypertension  (ICD-401.9) 15)  Gout  (ICD-274.9) 16)  Diabetes Mellitus, Type II  (ICD-250.00)  Medications Prior to Update: 1)  Glucotrol 5 Mg Tabs (Glipizide) .[name].. 1 Once Daily 2)  Allopurinol 300 Mg Tabs (Allopurinol) .... Once Daily 3)  Diovan 80 Mg Tabs (Valsartan) .... Once Daily 4)  Flomax 0.4 Mg Cp24 (Tamsulosin Hcl) .... Once Daily 5)  Mobic 15 Mg  Tabs (Meloxicam) .... Once Daily 6)  Travatan 0.004 %  Soln (Travoprost) .[name].. 1 Gtt Once Daily 7)  Freestyle Lite Test  Strp (Glucose Blood) .[name].. 1 Once Daily Dx Code 250.00 8)  Viagra 100 Mg Tabs (Sildenafil Citrate) .... Use As Directed \\par   Current Medications (verified): 1)  Glucotrol 5 Mg Tabs (Glipizide) .[name].. 1 Once Daily 2)  Allopurinol 300 Mg Tabs (Allopurinol) .... Once Daily 3)  Diovan 80 Mg Tabs (Valsartan) .... Once Daily 4)  Flomax 0.4 Mg Cp24 (Tamsulosin Hcl) .... Once Daily 5)  Mobic 15 Mg  Tabs (Meloxicam) .... Once Daily 6)  Travatan 0.004 %  Soln (Travoprost) .[name].. 1 Gtt Once Daily 7)   Freestyle Lite Test  Strp (Glucose Blood) .[name].. 1 Once Daily Dx Code 250.00 8)  Viagra 100 Mg Tabs (Sildenafil Citrate) .... Use As Directed \\par   Allergies (verified): No Known Drug Allergies  Past History:  Family History: Last updated: 06-Apr-2007 father died cad at 71 mother died 73 Family History Diabetes 1st degree relative  Social History: Last updated: 04-06-2007 Married Former Smoker Alcohol use-no Drug use-no Regular exercise-no  Risk Factors: Exercise: no (06-Apr-2007)  Risk Factors: Smoking Status: quit (06/29/2009)  Past medical, surgical, family and social histories (including risk factors) reviewed, and no changes noted (except as noted below).  Past Medical History: Reviewed history from 09/12/2007 and no changes required. Diabetes mellitus, type II Gout Hypertension Osteoarthritis gluacoma  Past Surgical History: Reviewed history from 04-06-07 and no changes required. Lumbar laminectomy Lumbar fusion Hemorrhoidectomy  Family History: Reviewed history from 04-06-2007 and no changes required. father died cad at 50 mother died 83 Family History Diabetes 1st degree relative  Social History: Reviewed history from 04-06-2007 and no changes required. Married Former Smoker Alcohol use-no Drug use-no Regular exercise-no  Review of Systems  The patient denies anorexia, fever, weight loss, weight gain, vision loss, decreased hearing, hoarseness, chest pain, syncope, dyspnea on exertion, peripheral edema, prolonged cough, headaches, hemoptysis, abdominal pain, melena, hematochezia, severe indigestion/heartburn, hematuria, incontinence, genital sores, muscle weakness, suspicious skin lesions, transient blindness, difficulty walking, depression, unusual weight change, abnormal bleeding, enlarged lymph nodes, angioedema, and breast masses.    Physical Exam  General:  Well-developed,well-nourished,in no acute distress; alert,appropriate and cooperative  throughout examination Head:  normocephalic and male-pattern balding.   Eyes:  pupils equal and pupils round.   Ears:  R ear normal and L ear normal.   Nose:  no external deformity and no nasal discharge.   Mouth:  pharynx pink and moist and no erythema.   Neck:  supple and full ROM.   Lungs:  normal respiratory effort and no wheezes.   Heart:  normal rate and regular rhythm.   Abdomen:  Bowel sounds positive,abdomen soft and non-tender without masses, organomegaly or hernias noted.   Impression & Recommendations:  Problem # 1:  B12 DEFICIENCY (ICD-266.2) try the b12 injectins to se if pain control can be improved b12 shots 1.5 cc today and then set up monthly  Orders: Vit B12 1000 mcg (J3420) Admin of Therapeutic Inj  intramuscular or subcutaneous (44010)  Problem # 2:  POLYNEUROPATHY IN DIABETES (ICD-357.2) the pt has numbness in the feet in a stocking foot distribution this fit more the DM neuropaty  Problem # 3:  HYPERTENSION (ICD-401.9) Assessment: Unchanged good cotrol. improved at goal His updated medication list for this problem includes:    Diovan 80 Mg Tabs (Valsartan) ..... Once daily  BP today: 112/70 Prior BP: 130/80 (03/24/2009)  Prior 10 Yr Risk Heart Disease: 27 % (08/04/2008)  Labs Reviewed: K+: 4.7 (03/24/2009) Creat: : 1.3 (03/24/2009)   Chol: 184 (03/24/2009)  HDL: 54.10 (03/24/2009)   LDL: 116 (03/24/2009)   TG: 69.0 (03/24/2009)  Problem # 4:  DIABETES MELLITUS, TYPE II (ICD-250.00) Assessment: Improved dicussed need for tight control His updated medication list for this problem includes:    Glucotrol 5 Mg Tabs (Glipizide) .[name]... 1 once daily    Diovan 80 Mg Tabs (Valsartan) ..... Once daily  Labs Reviewed: Creat: 1.3 (03/24/2009)     Last Eye Exam: normal (08/26/2008) Reviewed HgBA1c results: 5.7 (12/12/2008)  6.4 (09/11/2008)  Complete Medication List: 1)  Glucotrol 5 Mg Tabs (Glipizide) .[name].. 1 once daily 2)  Allopurinol 300 Mg Tabs  (Allopurinol) .... Once daily 3)  Diovan 80 Mg Tabs (Valsartan) .... Once daily 4)  Flomax 0.4 Mg Cp24 (Tamsulosin hcl) .... Once daily 5)  Mobic 15 Mg Tabs (Meloxicam) .... Once daily 6)  Travatan 0.004 % Soln (Travoprost) .[name].. 1 gtt once daily 7)  Freestyle Lite Test Strp (Glucose blood) .[name].. 1 once daily dx code 250.00 8)  Viagra 100 Mg Tabs (Sildenafil citrate) .... Use as directed \\par   Hypertension Assessment/Plan:      The patient's hypertensive risk group is category C: Target organ damage and/or diabetes.  His calculated 10 year risk of coronary heart disease is 27 %.  Today's blood pressure is 112/70.  His blood pressure goal is < 130/80.  Patient Instructions: 1)  three months  rovwith b12 level prior and  2)  HbgA1C prior to visit, ICD-9: 250.00 3)  every 4 weeks call for an apointment for bonnye to give a b12 shot 4)  noon and at 4 pm   Medication Administration  Injection # 1:    Medication: Vit B12 1000 mcg    Diagnosis: B12 DEFICIENCY (ICD-266.2)    Route: IM    Site: L deltoid    Exp Date: 01/01/2011    Lot #: [id number]    Mfr: [employer]ent    Patient tolerated injection without complications    Given by: Willy Eddy, LPN (June 29, 2009 9:52 AM)  Orders Added: 1)  Vit B12 1000 mcg [J3420] 2)  Admin of Therapeutic Inj  intramuscular or subcutaneous [96372] 3)  Est. Patient Level IV [09811]

## 2010-06-01 NOTE — Assessment & Plan Note (Signed)
Summary: EMP/JLS   Vital Signs:  Patient Profile:   77 Years Old Male Height:     74 inches Weight:      262 pounds Temp:     98.5 degrees F rectal Pulse rate:   72 / minute Resp:     14 per minute BP sitting:   128 / 76  (left arm)  Pt. in pain?   no  Vitals Entered By: Willy Eddy, LPN (March 15, 2007 8:46 AM)                  Chief Complaint:  cpx/dt in 2007/flex in 2003.    Past Medical History:    Reviewed history from 03/08/2007 and no changes required:       Diabetes mellitus, type II       Gout       Hypertension       Osteoarthritis  Past Surgical History:    Reviewed history and no changes required:       Lumbar laminectomy       Lumbar fusion       Hemorrhoidectomy   Family History:    father died cad at 22    mother died 55    Family History Diabetes 1st degree relative  Social History:    Married    Former Smoker    Alcohol use-no    Drug use-no    Regular exercise-no   Risk Factors:  Tobacco use:  quit Drug use:  no Alcohol use:  no Exercise:  no    Physical Exam  General:     Well-developed,well-nourished,in no acute distress; alert,appropriate and cooperative throughout examination Head:     male-pattern balding.   Eyes:     pupils equal and pupils round.   Ears:     R ear normal and L ear normal.   Nose:     no external deformity.   Mouth:     pharynx pink and moist.   Neck:     No deformities, masses, or tenderness noted. Lungs:     no intercostal retractions.   Heart:     normal rate and regular rhythm.   Abdomen:     soft, no masses, no guarding, and no rigidity.   Rectal:     external hemorrhoid(s).   Genitalia:     circumcised.   Prostate:     no gland enlargement and no nodules.   Msk:     normal ROM, no joint swelling, and no joint warmth.   Extremities:     trace left pedal edema and trace right pedal edema.   Neurologic:     alert & oriented X3.      Impression & Recommendations:   Problem # 1:  Preventive Health Care (ICD-V70.0) lot [id number] 10/30/2007 San[employer]i pasteur-left deltoid IM.0.49ml  Reviewed preventive care protocols, scheduled due services, and updated immunizations.  Orders: Gastroenterology Referral (GI)   Problem # 2:  OSTEOARTHRITIS (ICD-715.90) Assessment: Improved  His updated medication list for this problem includes:    Mobic 15 Mg Tabs (Meloxicam) ..... Once daily Discussed use of medications, application of heat or cold, and exercises.   Problem # 3:  CLL (ICD-204.10) Assessment: Unchanged white count 13.7 seeing the oncolgist with no inervention as of yet and no signs of metamophasis  ( platlets normal)  Problem # 4:  DIABETES MELLITUS, TYPE II (ICD-250.00) Assessment: Improved  His updated medication list for this problem includes:    Glipizide  5 Mg Tabs (Glipizide) ..... Once daily    Diovan 80 Mg Tabs (Valsartan) ..... Once daily  Labs Reviewed: HgBA1c: 5.8 (03/08/2007)   Creat: 1.3 (03/08/2007)      Problem # 5:  HYPERTENSION (ICD-401.9)  His updated medication list for this problem includes:    Diovan 80 Mg Tabs (Valsartan) ..... Once daily  Orders: EKG w/ Interpretation (93000)  NSSTTW changes  BP today: 128/76  Labs Reviewed: Creat: 1.3 (03/08/2007) Chol: 190 (03/08/2007)   HDL: 41.3 (03/08/2007)   LDL: 127 (03/08/2007)   TG: 110 (03/08/2007)  Orders: EKG w/ Interpretation (93000)   Complete Medication List: 1)  Glipizide 5 Mg Tabs (Glipizide) .... Once daily 2)  Allopurinol 300 Mg Tabs (Allopurinol) .... Once daily 3)  Diovan 80 Mg Tabs (Valsartan) .... Once daily 4)  Flomax 0.4 Mg Cp24 (Tamsulosin hcl) .... Once daily 5)  Mobic 15 Mg Tabs (Meloxicam) .... Once daily 6)  Travatan 0.004 % Soln (Travoprost) .[name].. 1 gtt once daily  Other Orders: Influenza Vaccine MCR (95638)   Patient Instructions: 1)  Please schedule a follow-up appointment in 6 months. 2)  CBC w/ Diff prior to visit, ICD-9:204.10 3)   HbgA1C prior to visit, ICD-9:  250.02    Prescriptions: TRAVATAN 0.004 %  SOLN (TRAVOPROST) 1 gtt once daily  #1 x 11   Entered and Authorized by:   Stacie Glaze MD   Signed by:   Stacie Glaze MD on 03/15/2007   Method used:   Electronically sent to ...       [facility] [address] [phone]*       [address]       [place], [place]  [address]       Ph: [phone] or [phone]       Fax: [phone]   RxID:   [id number] MOBIC 15 MG  TABS (MELOXICAM) once daily  #90 x 3   Entered and Authorized by:   Stacie Glaze MD   Signed by:   Stacie Glaze MD on 03/15/2007   Method used:   Electronically sent to ...       [facility] [address] [phone]*       [address]       [place], [place]  [address]       Ph: [phone] or [phone]       Fax: [phone]   RxID:   [id number] FLOMAX 0.4 MG CP24 (TAMSULOSIN HCL) once daily  #30 x 11   Entered and Authorized by:   Stacie Glaze MD   Signed by:   Stacie Glaze MD on 03/15/2007   Method used:   Electronically sent to ...       [facility] [address] [phone]*       [address]       [place], [place]  [address]       Ph: [phone] or [phone]       Fax: [phone]   RxID:   [id number] DIOVAN 80 MG TABS (VALSARTAN) once daily  #30 x 11   Entered and Authorized by:   Stacie Glaze MD   Signed by:   Stacie Glaze MD on 03/15/2007   Method used:   Electronically sent to .[facility] Kitchen[facility] Kitchen  [facility] [address] [phone]*       [address]       [place], [place]  [address]       Ph: [phone] or [phone]       Fax: [phone]   RxID:   [id number] ALLOPURINOL 300 MG TABS (ALLOPURINOL) once daily  #30 x 11   Entered and Authorized by:   Stacie Glaze MD   Signed by:   Stacie Glaze MD on 03/15/2007   Method used:   Electronically sent  to ...       [facility] [address] [phone]*       [address]       [place], [place]  [address]       Ph: [phone] or [phone]       Fax: [phone]   RxID:   [id number]  ]  Influenza Vaccine    Vaccine Type: Fluvax MCR    Given by: Willy Eddy, LPN  Flu Vaccine Consent Questions    Do you have a history of severe allergic reactions to this vaccine? no    Any prior history of allergic reactions to egg and/or gelatin? no    Do you have a sensitivity to the preservative Thimersol? no    Do you have a past history of Guillan-Barre Syndrome? no    Do you currently have an acute febrile illness? no    Have you ever had a severe reaction to latex? no    Vaccine information given and explained to patient? yes

## 2010-06-01 NOTE — Assessment & Plan Note (Signed)
Summary: b12 inj/njr   Nurse Visit   Allergies: No Known Drug Allergies  Medication Administration  Injection # 1:    Medication: Vit B12 1000 mcg    Diagnosis: B12 DEFICIENCY (ICD-266.2)    Route: IM    Site: L deltoid    Exp Date: 02/27/2010    Lot #: [id number]    Mfr: [employer]    Patient tolerated injection without complications    Given by: Willy Eddy, LPN (December 03, 2009 12:12 PM)  Orders Added: 1)  Vit B12 1000 mcg [J3420] 2)  Admin of Therapeutic Inj  intramuscular or subcutaneous [95621]

## 2010-06-01 NOTE — Assessment & Plan Note (Signed)
Summary: back pain/mhf   Vital Signs:  Patient profile:   77 year old male Weight:      252 pounds Temp:     98.2 degrees F oral BP sitting:   120 / 72  Vitals Entered By: Lynann Beaver CMA (August 04, 2008 4:14 PM) CC: low back pain, Hypertension Management Is Patient Diabetic? No Pain Assessment Patient in pain? yes        Primary Care Provider:  Stacie Glaze MD  CC:  low back pain and Hypertension Management.  History of Present Illness: Pt noted on thursday got progressively worse and firday the pain begain to radiate to the left leg wth tingling sensations Back surgery 2001 L4-5 ( [facility]) [facility] and got a muscle relaxer and pain meds No prednisone  Hypertension History:      He denies headache, chest pain, palpitations, dyspnea with exertion, orthopnea, PND, peripheral edema, visual symptoms, neurologic problems, syncope, and side effects from treatment.        Positive major cardiovascular risk factors include male age 26 years old or older, diabetes, and hypertension.  Negative major cardiovascular risk factors include non-tobacco-user status.     Problems Prior to Update: 1)  Low Back Pain, Acute  (ICD-724.2) 2)  Erectile Dysfunction  (ICD-302.72) 3)  Acute Bronchitis  (ICD-466.0) 4)  Preventive Health Care  (ICD-V70.0) 5)  Family History Diabetes 1st Degree Relative  (ICD-V18.0) 6)  Glaucoma  (ICD-365.9) 7)  Back Pain  (ICD-724.5) 8)  Cll  (ICD-204.10) 9)  Osteoarthritis  (ICD-715.90) 10)  Hypertension  (ICD-401.9) 11)  Gout  (ICD-274.9) 12)  Diabetes Mellitus, Type II  (ICD-250.00)  Medications Prior to Update: 1)  Glipizide 5 Mg  Tabs (Glipizide) .... Once Daily 2)  Allopurinol 300 Mg Tabs (Allopurinol) .... Once Daily 3)  Diovan 80 Mg Tabs (Valsartan) .... Once Daily 4)  Flomax 0.4 Mg Cp24 (Tamsulosin Hcl) .... Once Daily 5)  Mobic 15 Mg  Tabs (Meloxicam) .... Once Daily 6)  Travatan 0.004 %  Soln (Travoprost) .[name].. 1 Gtt Once  Daily 7)  Vicodin Es 7.5-750 Mg Tabs (Hydrocodone-Acetaminophen) .[name].. 1 By Mouth Every 6 Hours As Needed 8)  Flexeril 10 Mg Tabs (Cyclobenzaprine Hcl) .[name].. 1 By Mouth Three Times A Day  Current Medications (verified): 1)  Glipizide 5 Mg  Tabs (Glipizide) .... Once Daily 2)  Allopurinol 300 Mg Tabs (Allopurinol) .... Once Daily 3)  Diovan 80 Mg Tabs (Valsartan) .... Once Daily 4)  Flomax 0.4 Mg Cp24 (Tamsulosin Hcl) .... Once Daily 5)  Mobic 15 Mg  Tabs (Meloxicam) .... Once Daily 6)  Travatan 0.004 %  Soln (Travoprost) .[name].. 1 Gtt Once Daily 7)  Vicodin Es 7.5-750 Mg Tabs (Hydrocodone-Acetaminophen) .[name].. 1 By Mouth Every 6 Hours As Needed 8)  Flexeril 10 Mg Tabs (Cyclobenzaprine Hcl) .[name].. 1 By Mouth Three Times A Day  Allergies (verified): No Known Drug Allergies  Past History:  Family History:    father died cad at 33    mother died 72    Family History Diabetes 1st degree relative     (03/15/2007)  Social History:    Married    Former Smoker    Alcohol use-no    Drug use-no    Regular exercise-no     (03/15/2007)  Risk Factors:    Alcohol Use: N/A    >5 drinks/d w/in last 3 months: N/A    Caffeine Use: N/A    Diet: N/A  Exercise: no (03/15/2007)  Risk Factors:    Smoking Status: quit (03/15/2007)    Packs/Day: N/A    Cigars/wk: N/A    Pipe Use/wk: N/A    Cans of tobacco/wk: N/A    Passive Smoke Exposure: N/A  Past medical, surgical, family and social histories (including risk factors) reviewed, and no changes noted (except as noted below).  Past Medical History:    Reviewed history from 09/12/2007 and no changes required:    Diabetes mellitus, type II    Gout    Hypertension    Osteoarthritis    gluacoma  Past Surgical History:    Reviewed history from 03/15/2007 and no changes required:    Lumbar laminectomy    Lumbar fusion    Hemorrhoidectomy  Family History:    Reviewed history from 03/15/2007 and no changes required:       father died cad at  79       mother died 32       Family History Diabetes 1st degree relative  Social History:    Reviewed history from 03/15/2007 and no changes required:       Married       Former Smoker       Alcohol use-no       Drug use-no       Regular exercise-no  Review of Systems  The patient denies anorexia, fever, weight loss, weight gain, vision loss, decreased hearing, hoarseness, chest pain, syncope, dyspnea on exertion, peripheral edema, prolonged cough, headaches, hemoptysis, abdominal pain, melena, hematochezia, severe indigestion/heartburn, hematuria, incontinence, genital sores, muscle weakness, suspicious skin lesions, transient blindness, difficulty walking, depression, unusual weight change, abnormal bleeding, enlarged lymph nodes, angioedema, breast masses, and testicular masses.    Physical Exam  General:  Well-developed,well-nourished,in no acute distress; alert,appropriate and cooperative throughout examination Head:  male-pattern balding.  atraumatic.   Eyes:  pupils equal and pupils round.   Ears:  R ear normal and L ear normal.   Nose:  no external deformity.  no nasal discharge.   Mouth:  pharynx pink and moist.   Neck:  No deformities, masses, or tenderness noted. Lungs:  no intercostal retractions.  normal respiratory effort and no crackles.   Heart:  normal rate and regular rhythm.   Abdomen:  soft and normal bowel sounds.   Rectal:  external hemorrhoid(s).   Msk:  SI joint tenderness and trigger point tenderness.   Neurologic:  alert & oriented X3 and gait normal.     Impression & Recommendations:  Problem # 1:  LUMBAR RADICULOPATHY, LEFT (ICD-724.4)  His updated medication list for this problem includes:    Mobic 15 Mg Tabs (Meloxicam) ..... Once daily    Vicodin Es 7.5-750 Mg Tabs (Hydrocodone-acetaminophen) .[name]... 1 by mouth every 6 hours as needed    Flexeril 10 Mg Tabs (Cyclobenzaprine hcl) .[name]... 1 by mouth three times a day  Discussed use of moist heat or  ice, modified activities, medications, and stretching/strengthening exercises. Back care instructions given. To be seen in 2 weeks if no improvement; sooner if worsening of symptoms.   Orders: Radiology Referral (Radiology)  Problem # 2:  DIABETES MELLITUS, TYPE II (ICD-250.00)  His updated medication list for this problem includes:    Glipizide 5 Mg Tabs (Glipizide) ..... Once daily    Diovan 80 Mg Tabs (Valsartan) ..... Once daily  Labs Reviewed: Creat: 1.3 (03/18/2008)    Reviewed HgBA1c results: 5.6 (03/18/2008)  6.1 (09/12/2007)  Orders: TLB-A1C / Hgb A1C (Glycohemoglobin) (  83036-A1C)  Complete Medication List: 1)  Glipizide 5 Mg Tabs (Glipizide) .... Once daily 2)  Allopurinol 300 Mg Tabs (Allopurinol) .... Once daily 3)  Diovan 80 Mg Tabs (Valsartan) .... Once daily 4)  Flomax 0.4 Mg Cp24 (Tamsulosin hcl) .... Once daily 5)  Mobic 15 Mg Tabs (Meloxicam) .... Once daily 6)  Travatan 0.004 % Soln (Travoprost) .[name].. 1 gtt once daily 7)  Vicodin Es 7.5-750 Mg Tabs (Hydrocodone-acetaminophen) .[name].. 1 by mouth every 6 hours as needed 8)  Flexeril 10 Mg Tabs (Cyclobenzaprine hcl) .[name].. 1 by mouth three times a day 9)  Prednisone 20 Mg Tabs (Prednisone) .... One by mouth three times a day for 4days then one by mouth two times a day for 4 days then one daily for 4days the 1/2 daily for 4 days  Other Orders: UA Dipstick W/ Micro (manual) (09811) Venipuncture (91478) TLB-BMP (Basic Metabolic Panel-BMET) (80048-METABOL)  Hypertension Assessment/Plan:      The patient's hypertensive risk group is category C: Target organ damage and/or diabetes.  His calculated 10 year risk of coronary heart disease is 27 %.  Today's blood pressure is 120/72.  His blood pressure goal is < 130/80.  Patient Instructions: 1)  Please schedule a follow-up appointment as needed. Prescriptions: PREDNISONE 20 MG TABS (PREDNISONE) one by mouth three times a day for 4days then one by mouth two times a day for 4  days then one daily for 4days the 1/2 daily for 4 days  #26 x 0   Entered and Authorized by:   Stacie Glaze MD   Signed by:   Stacie Glaze MD on 08/04/2008   Method used:   Electronically to        [facility]  [address] [id number]* (retail)       [address]       [place], [place]  [id number]       Ph: [phone] or [phone]       Fax: [phone]   RxID:   [id number]   Appended Document: back pain/mhf  Laboratory Results   Urine Tests    Routine Urinalysis   Color: yellow Appearance: Clear Glucose: negative   (Normal Range: Negative) Bilirubin: negative   (Normal Range: Negative) Ketone: negative   (Normal Range: Negative) Spec. Gravity: 1.015   (Normal Range: 1.003-1.035) Blood: negative   (Normal Range: Negative) pH: 5.5   (Normal Range: 5.0-8.0) Protein: negative   (Normal Range: Negative) Urobilinogen: 0.2   (Normal Range: 0-1) Nitrite: negative   (Normal Range: Negative) Leukocyte Esterace: negative   (Normal Range: Negative)    Comments: Joanne Chars CMA  August 04, 2008 5:09 PM

## 2010-06-01 NOTE — Assessment & Plan Note (Signed)
Summary: 3 month rov/?b12 inj/njr   Vital Signs:  Patient profile:   77 year old male Height:      72 inches Weight:      258 pounds BMI:     35.12 Temp:     98.3 degrees F oral Pulse rate:   76 / minute Resp:     14 per minute BP sitting:   112 / 70  (left arm)  Vitals Entered By: Willy Eddy, LPN (October 06, 452 9:20 AM) CC: roa labs, Type 2 diabetes mellitus follow-up   Primary Care Provider:  Stacie Glaze MD  CC:  roa labs and Type 2 diabetes mellitus follow-up.  History of Present Illness: the pt presents fro follow up  Type 2 Diabetes Mellitus Follow-Up      This is a 77 year old man who presents for Type 2 diabetes mellitus follow-up.  aic of 6  and stable cbg's at home.  The patient denies polyuria, polydipsia, blurred vision, self managed hypoglycemia, hypoglycemia requiring help, weight loss, weight gain, and numbness of extremities.  The patient denies the following symptoms: neuropathic pain, chest pain, vomiting, orthostatic symptoms, poor wound healing, intermittent claudication, vision loss, and foot ulcer.  Since the last visit the patient reports good dietary compliance.  The patient has been measuring capillary blood glucose before breakfast.  Since the last visit, the patient reports having had eye care by an ophthalmologist.  Complications from diabetes include peripheral neuropathy.    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) 3)  Back Pain, Lumbar, With Radiculopathy  (ICD-724.4) 4)  Lumbar Radiculopathy, Left  (ICD-724.4) 5)  Low Back Pain, Acute  (ICD-724.2) 6)  Erectile Dysfunction  (ICD-302.72) 7)  Acute Bronchitis  (ICD-466.0) 8)  Preventive Health Care  (ICD-V70.0) 9)  Family History Diabetes 1st Degree Relative  (ICD-V18.0) 10)  Glaucoma  (ICD-365.9) 11)  Back Pain  (ICD-724.5) 12)  Cll   (ICD-204.10) 13)  Osteoarthritis  (ICD-715.90) 14)  Hypertension  (ICD-401.9) 15)  Gout  (ICD-274.9) 16)  Diabetes Mellitus, Type II  (ICD-250.00)  Current Problems (verified): 1)  B12 Deficiency  (ICD-266.2) 2)  Polyneuropathy in Diabetes  (ICD-357.2) 3)  Back Pain, Lumbar, With Radiculopathy  (ICD-724.4) 4)  Lumbar Radiculopathy, Left  (ICD-724.4) 5)  Low Back Pain, Acute  (ICD-724.2) 6)  Erectile Dysfunction  (ICD-302.72) 7)  Acute Bronchitis  (ICD-466.0) 8)  Preventive Health Care  (ICD-V70.0) 9)  Family History Diabetes 1st Degree Relative  (ICD-V18.0) 10)  Glaucoma  (ICD-365.9) 11)  Back Pain  (ICD-724.5) 12)  Cll  (ICD-204.10) 13)  Osteoarthritis  (ICD-715.90) 14)  Hypertension  (ICD-401.9) 15)  Gout  (ICD-274.9) 16)  Diabetes Mellitus, Type II  (ICD-250.00)  Medications Prior to Update: 1)  Glucotrol 5 Mg Tabs (Glipizide) .[name].. 1 Once Daily 2)  Allopurinol 300 Mg Tabs (Allopurinol) .... Once Daily 3)  Diovan 80 Mg Tabs (Valsartan) .... Once Daily 4)  Flomax 0.4 Mg Cp24 (Tamsulosin Hcl) .... Once Daily 5)  Mobic 15 Mg  Tabs (Meloxicam) .... Once Daily 6)  Travatan 0.004 %  Soln (Travoprost) .[name].. 1 Gtt Once Daily 7)  Freestyle Lite Test  Strp (Glucose Blood) .[name].. 1 Once Daily Dx Code 250.00 8)  Viagra 100 Mg Tabs (Sildenafil Citrate) .... Use As Directed \\par  9)  Cyanocobalamin 1000 Mcg/ml Soln (Cyanocobalamin) .... 1.5 Monthly  Current Medications (verified): 1)  Glucotrol 5 Mg Tabs (Glipizide) .[name].. 1 Once Daily 2)  Allopurinol 300 Mg Tabs (Allopurinol) .... Once Daily 3)  Diovan 80 Mg Tabs (Valsartan) .... Once Daily 4)  Flomax 0.4 Mg Cp24 (Tamsulosin Hcl) .... Once Daily 5)  Mobic 15 Mg  Tabs (Meloxicam) .... Once Daily 6)  Travatan 0.004 %  Soln (Travoprost) .[name].. 1 Gtt Once Daily 7)  Freestyle Lite Test  Strp (Glucose Blood) .[name].. 1 Once Daily Dx Code 250.00 8)  Viagra 100 Mg Tabs (Sildenafil Citrate) .... Use As Directed \\par  9)  Cyanocobalamin 1000 Mcg/ml Soln  (Cyanocobalamin) .... 1.5 Monthly  Allergies (verified): No Known Drug Allergies  Past History:  Family History: Last updated: 03-17-2007 father died cad at 75 mother died 75 Family History Diabetes 1st degree relative  Social History: Last updated: 03-17-2007 Married Former Smoker Alcohol use-no Drug use-no Regular exercise-no  Risk Factors: Exercise: no (Mar 17, 2007)  Risk Factors: Smoking Status: quit (10/06/2009)  Past medical, surgical, family and social histories (including risk factors) reviewed, and no changes noted (except as noted below).  Past Medical History: Reviewed history from 09/12/2007 and no changes required. Diabetes mellitus, type II Gout Hypertension Osteoarthritis gluacoma  Past Surgical History: Reviewed history from 2007/03/17 and no changes required. Lumbar laminectomy Lumbar fusion Hemorrhoidectomy  Family History: Reviewed history from Mar 17, 2007 and no changes required. father died cad at 75 mother died 38 Family History Diabetes 1st degree relative  Social History: Reviewed history from Mar 17, 2007 and no changes required. Married Former Smoker Alcohol use-no Drug use-no Regular exercise-no  Review of Systems  The patient denies anorexia, fever, weight loss, weight gain, vision loss, decreased hearing, hoarseness, chest pain, syncope, dyspnea on exertion, peripheral edema, prolonged cough, headaches, hemoptysis, abdominal pain, melena, hematochezia, severe indigestion/heartburn, hematuria, incontinence, genital sores, muscle weakness, suspicious skin lesions, transient blindness, difficulty walking, depression, unusual weight change, abnormal bleeding, enlarged lymph nodes, angioedema, breast masses, and testicular masses.    Physical Exam  General:  Well-developed,well-nourished,in no acute distress; alert,appropriate and cooperative throughout examination Head:  normocephalic and male-pattern balding.   Eyes:  pupils equal  and pupils round.   Ears:  R ear normal and L ear normal.   Nose:  no external deformity and no nasal discharge.   Mouth:  pharynx pink and moist and no erythema.   Neck:  supple and full ROM.   Lungs:  normal respiratory effort and no wheezes.   Heart:  normal rate and regular rhythm.   Abdomen:  Bowel sounds positive,abdomen soft and non-tender without masses, organomegaly or hernias noted.  Diabetes Management Exam:    Foot Exam (with socks and/or shoes not present):       Sensory-Pinprick/Light touch:          Left medial foot (L-4): diminished          Left dorsal foot (L-5): diminished          Left lateral foot (S-1): diminished          Right medial foot (L-4): normal          Right dorsal foot (L-5): normal          Right lateral foot (S-1): normal       Sensory-Monofilament:          Left foot: diminished          Right foot: normal       Inspection:          Left foot: normal  Right foot: normal       Nails:          Left foot: normal          Right foot: normal    Eye Exam:       Eye Exam done elsewhere          Date: 09/09/2009          Results: normal          Done by: hollander   Impression & Recommendations:  Problem # 1:  HYPERTENSION (ICD-401.9) Assessment Improved  His updated medication list for this problem includes:    Diovan 80 Mg Tabs (Valsartan) ..... Once daily  BP today: 112/70 Prior BP: 112/70 (06/29/2009)  Prior 10 Yr Risk Heart Disease: 27 % (08/04/2008)  Labs Reviewed: K+: 4.7 (03/24/2009) Creat: : 1.3 (03/24/2009)   Chol: 184 (03/24/2009)   HDL: 54.10 (03/24/2009)   LDL: 116 (03/24/2009)   TG: 69.0 (03/24/2009)  Problem # 2:  DIABETES MELLITUS, TYPE II (ICD-250.00) Assessment: Improved  His updated medication list for this problem includes:    Glucotrol 5 Mg Tabs (Glipizide) .[name]... 1 once daily    Diovan 80 Mg Tabs (Valsartan) ..... Once daily  Labs Reviewed: Creat: 1.3 (03/24/2009)     Last Eye Exam: normal  (09/09/2009) Reviewed HgBA1c results: 6.0 (09/29/2009)  5.7 (12/12/2008)  Problem # 3:  GOUT (ICD-274.9) Assessment: Unchanged gout controlled His updated medication list for this problem includes:    Allopurinol 300 Mg Tabs (Allopurinol) ..... Once daily  Problem # 4:  B12 DEFICIENCY (ICD-266.2) for both the neuopathy and the deficiency Orders: Vit B12 1000 mcg (J3420) Admin of Therapeutic Inj  intramuscular or subcutaneous (16109)  Complete Medication List: 1)  Glucotrol 5 Mg Tabs (Glipizide) .[name].. 1 once daily 2)  Allopurinol 300 Mg Tabs (Allopurinol) .... Once daily 3)  Diovan 80 Mg Tabs (Valsartan) .... Once daily 4)  Flomax 0.4 Mg Cp24 (Tamsulosin hcl) .... Once daily 5)  Mobic 15 Mg Tabs (Meloxicam) .... Once daily 6)  Travatan 0.004 % Soln (Travoprost) .[name].. 1 gtt once daily 7)  Freestyle Lite Test Strp (Glucose blood) .[name].. 1 once daily dx code 250.00 8)  Viagra 100 Mg Tabs (Sildenafil citrate) .... Use as directed \\par  9)  Cyanocobalamin 1000 Mcg/ml Soln (Cyanocobalamin) .... 1.5 monthly  Patient Instructions: 1)  Please schedule a follow-up appointment in 4 months. 2)  mucinex d or plain twice a day  for the cough 3)  CPX  due  in 4 months  may have labs in advance ( blue [facility]) 4)  add to cpx labs   a1c 250.00   Medication Administration  Injection # 1:    Medication: Vit B12 1000 mcg    Diagnosis: B12 DEFICIENCY (ICD-266.2)    Route: IM    Site: R deltoid    Exp Date: 01/29/2011    Lot #: [id number]    Mfr: [employer]    Patient tolerated injection without complications    Given by: Willy Eddy, LPN (October 06, 4096 9:21 AM)  Orders Added: 1)  Vit B12 1000 mcg [J3420] 2)  Admin of Therapeutic Inj  intramuscular or subcutaneous [96372] 3)  Est. Patient Level IV [11914]

## 2010-06-01 NOTE — Letter (Signed)
Summary: [facility]  [facility]   Imported By: Maryln Gottron 01/18/2010 13:03:31  _____________________________________________________________________  External Attachment:    Type:   Image     Comment:   External Document

## 2010-06-01 NOTE — Assessment & Plan Note (Signed)
Summary: BACK PAIN/LD   Vital Signs:  Patient profile:   77 year old male Height:      74 inches Weight:      253 pounds BMI:     32.60 O2 Sat:      97 % Temp:     97.0 degrees F oral Pulse rate:   93 / minute BP sitting:   116 / 78  (left arm) Cuff size:   regular  Vitals Entered By: Payton Spark CMA (August 02, 2008 12:44 PM) CC: L lg pain and LBP x 2 days/ feels like leg is going to give out, Back Pain Pain Assessment Patient in pain? yes     Location: L leg & low back  Intensity: 10   History of Present Illness:       This is a 77 year old man who presents with Back Pain.  The symptoms began 2 days ago.  The patient reports loss of sensation, but denies fever, chills, weakness, fecal incontinence, urinary incontinence, urinary retention, dysuria, rest pain, inability to work, and inability to care for self.  The pain is located in the left low back.  The pain began at home and suddenly.  The pain radiates to the left leg below the knee.  The pain is made worse by inactivity and activity.  Pt has hx back surgery with Dr Gerlene Fee in the 90s.    Problems Prior to Update: 1)  Erectile Dysfunction  (ICD-302.72) 2)  Acute Bronchitis  (ICD-466.0) 3)  Preventive Health Care  (ICD-V70.0) 4)  Family History Diabetes 1st Degree Relative  (ICD-V18.0) 5)  Glaucoma  (ICD-365.9) 6)  Back Pain  (ICD-724.5) 7)  Cll  (ICD-204.10) 8)  Osteoarthritis  (ICD-715.90) 9)  Hypertension  (ICD-401.9) 10)  Gout  (ICD-274.9) 11)  Diabetes Mellitus, Type II  (ICD-250.00)  Medications Prior to Update: 1)  Glipizide 5 Mg  Tabs (Glipizide) .... Once Daily 2)  Allopurinol 300 Mg Tabs (Allopurinol) .... Once Daily 3)  Diovan 80 Mg Tabs (Valsartan) .... Once Daily 4)  Flomax 0.4 Mg Cp24 (Tamsulosin Hcl) .... Once Daily 5)  Mobic 15 Mg  Tabs (Meloxicam) .... Once Daily 6)  Travatan 0.004 %  Soln (Travoprost) .[name].. 1 Gtt Once Daily  Current Medications (verified): 1)  Glipizide 5 Mg  Tabs (Glipizide)  .... Once Daily 2)  Allopurinol 300 Mg Tabs (Allopurinol) .... Once Daily 3)  Diovan 80 Mg Tabs (Valsartan) .... Once Daily 4)  Flomax 0.4 Mg Cp24 (Tamsulosin Hcl) .... Once Daily 5)  Mobic 15 Mg  Tabs (Meloxicam) .... Once Daily 6)  Travatan 0.004 %  Soln (Travoprost) .[name].. 1 Gtt Once Daily 7)  Vicodin Es 7.5-750 Mg Tabs (Hydrocodone-Acetaminophen) .[name].. 1 By Mouth Every 6 Hours As Needed 8)  Flexeril 10 Mg Tabs (Cyclobenzaprine Hcl) .[name].. 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)  Family History:    father died cad at 76    mother died 33    Family History Diabetes 1st degree relative     (03/15/2007)  Risk Factors:    Alcohol Use: N/A    >5 drinks/d w/in last 3 months: N/A    Caffeine Use: N/A    Diet: N/A    Exercise: no (03/15/2007) PMH-FH-SH reviewed-no changes except  otherwise noted  Family History:    Reviewed history from 03/15/2007 and no changes required:       father died cad at 76       mother died 26       Family History Diabetes 1st degree relative  Social History:    Reviewed history from 03/15/2007 and no changes required:       Married       Former Smoker       Alcohol use-no       Drug use-no       Regular exercise-no  Review of Systems      See HPI  Physical Exam  General:  Well-developed,well-nourished,in no acute distress; alert,appropriate and cooperative throughout examination Neurologic:  DTRs symmetrical and normal.  Pt walking with cane.   + slr on Left decreased hip flexion on L strength otherwise normal Skin:  Intact without suspicious lesions or rashes Psych:  Cognition and judgment appear intact. Alert and cooperative with normal attention span and concentration. No apparent delusions, illusions, hallucinations   Impression & Recommendations:  Problem # 1:   LOW BACK PAIN, ACUTE (ICD-724.2)  His updated medication list for this problem includes:    Mobic 15 Mg Tabs (Meloxicam) ..... Once daily    Vicodin Es 7.5-750 Mg Tabs (Hydrocodone-acetaminophen) .[name]... 1 by mouth every 6 hours as needed    Flexeril 10 Mg Tabs (Cyclobenzaprine hcl) .[name]... 1 by mouth three times a day  Discussed use of moist heat or ice, modified activities, medications, and stretching/strengthening exercises. Back care instructions given. To be seen in 2 weeks if no improvement; sooner if worsening of symptoms.   Complete Medication List: 1)  Glipizide 5 Mg Tabs (Glipizide) .... Once daily 2)  Allopurinol 300 Mg Tabs (Allopurinol) .... Once daily 3)  Diovan 80 Mg Tabs (Valsartan) .... Once daily 4)  Flomax 0.4 Mg Cp24 (Tamsulosin hcl) .... Once daily 5)  Mobic 15 Mg Tabs (Meloxicam) .... Once daily 6)  Travatan 0.004 % Soln (Travoprost) .[name].. 1 gtt once daily 7)  Vicodin Es 7.5-750 Mg Tabs (Hydrocodone-acetaminophen) .[name].. 1 by mouth every 6 hours as needed 8)  Flexeril 10 Mg Tabs (Cyclobenzaprine hcl) .[name].. 1 by mouth three times a day Prescriptions: FLEXERIL 10 MG TABS (CYCLOBENZAPRINE HCL) 1 by mouth three times a day  #30 x 0   Entered and Authorized by:   Loreen Freud DO   Signed by:   Loreen Freud DO on 08/02/2008   Method used:   Print then Give to Patient   RxID:   [id number] VICODIN ES 7.5-750 MG TABS (HYDROCODONE-ACETAMINOPHEN) 1 by mouth every 6 hours as needed  #30 x 0   Entered and Authorized by:   Loreen Freud DO   Signed by:   Loreen Freud DO on 08/02/2008   Method used:   Print then Give to Patient   RxID:   [id number]

## 2010-06-01 NOTE — Progress Notes (Signed)
Summary: Rx request for Viagra  Phone Note Call from Patient   Complaint: Urinary/GYN Problems Summary of Call: Patient requesting a script for Viagra [facility] Patient can be reached at [phone]. Initial call taken by: Darra Lis RMA,  January 23, 2009 2:11 PM    New/Updated Medications: VIAGRA 100 MG TABS (SILDENAFIL CITRATE) Use as directed \\par  Prescriptions: VIAGRA 100 MG TABS (SILDENAFIL CITRATE) Use as directed  #6 x 3   Entered by:   Willy Eddy, LPN   Authorized by:   Stacie Glaze MD   Signed by:   Willy Eddy, LPN on 09/81/1914   Method used:   Telephoned to ...       [facility]  [address] [phone]* (retail)       [address]       [place], [place]  [id number]       Ph: [phone] or [phone]       Fax: [phone]   RxID:   [id number]

## 2010-06-01 NOTE — Progress Notes (Signed)
Summary: RESULTS MRI  Phone Note From Other Clinic Call back at [facility] Phone [phone]   Caller: Patient Call For: DR Lovell Sheehan Summary of Call: PT WOULD LIKE MRI RESULTS Initial call taken by: Heron Sabins,  August 08, 2008 11:02 AM      Appended Document: RESULTS MRI per dr Perry Mount send to dr Ethelene Hal for possible back injection- pt informed and order sent to terry

## 2010-06-03 NOTE — Assessment & Plan Note (Signed)
Summary: cpx/njr rsc appt time/njr   Vital Signs:  Patient profile:   77 year old male Height:      72 inches Weight:      268 pounds BMI:     36.48 Temp:     98.1 degrees F oral Pulse rate:   68 / minute Resp:     14 per minute BP sitting:   140 / 80  (left arm)  Vitals Entered By: Willy Eddy, LPN (March 30, 2010 2:53 PM)  Nutrition Counseling: Patient's BMI is greater than 25 and therefore counseled on weight management options. CC: annual visit for disease management, Hypertension Management Is Patient Diabetic? Yes Did you bring your meter with you today? No   Primary Care Provider:  Stacie Glaze MD  CC:  annual visit for disease management and Hypertension Management.  History of Present Illness: The pt was asked about all immunizations, health maint. services that are appropriate to their age and was given guidance on diet exercize  and weight management  In addition to his welness exam we discussed his AODM and cbg readings  thta are reported to be in the 120-120 range. His HTn has been controled and he is compliant with his blood presure medications   Hypertension History:      He denies headache, chest pain, palpitations, dyspnea with exertion, orthopnea, PND, peripheral edema, visual symptoms, neurologic problems, syncope, and side effects from treatment.        Positive major cardiovascular risk factors include male age 51 years old or older, diabetes, and hypertension.  Negative major cardiovascular risk factors include non-tobacco-user status.     Preventive Screening-Counseling & Management  Alcohol-Tobacco     Smoking Status: quit     Tobacco Counseling: to remain off tobacco products  Problems Prior to Update: 1)  B12 Deficiency  (ICD-266.2) 2)  Polyneuropathy in Diabetes  (ICD-357.2) 3)  Back Pain, Lumbar, With Radiculopathy  (ICD-724.4) 4)  Lumbar Radiculopathy, Left  (ICD-724.4) 5)  Low Back Pain, Acute  (ICD-724.2) 6)  Erectile  Dysfunction  (ICD-302.72) 7)  Acute Bronchitis  (ICD-466.0) 8)  Preventive Health Care  (ICD-V70.0) 9)  Family History Diabetes 1st Degree Relative  (ICD-V18.0) 10)  Glaucoma  (ICD-365.9) 11)  Back Pain  (ICD-724.5) 12)  Cll  (ICD-204.10) 13)  Osteoarthritis  (ICD-715.90) 14)  Hypertension  (ICD-401.9) 15)  Gout  (ICD-274.9) 16)  Diabetes Mellitus, Type II  (ICD-250.00)  Current Problems (verified): 1)  B12 Deficiency  (ICD-266.2) 2)  Polyneuropathy in Diabetes  (ICD-357.2) 3)  Back Pain, Lumbar, With Radiculopathy  (ICD-724.4) 4)  Lumbar Radiculopathy, Left  (ICD-724.4) 5)  Low Back Pain, Acute  (ICD-724.2) 6)  Erectile Dysfunction  (ICD-302.72) 7)  Acute Bronchitis  (ICD-466.0) 8)  Preventive Health Care  (ICD-V70.0) 9)  Family History Diabetes 1st Degree Relative  (ICD-V18.0) 10)  Glaucoma  (ICD-365.9) 11)  Back Pain  (ICD-724.5) 12)  Cll  (ICD-204.10) 13)  Osteoarthritis  (ICD-715.90) 14)  Hypertension  (ICD-401.9) 15)  Gout  (ICD-274.9) 16)  Diabetes Mellitus, Type II  (ICD-250.00)  Medications Prior to Update: 1)  Glucotrol 5 Mg Tabs (Glipizide) .[name].. 1 Once Daily 2)  Allopurinol 300 Mg Tabs (Allopurinol) .... Once Daily 3)  Diovan 80 Mg Tabs (Valsartan) .... Once Daily 4)  Flomax 0.4 Mg Cp24 (Tamsulosin Hcl) .... Once Daily 5)  Mobic 15 Mg  Tabs (Meloxicam) .... Once Daily 6)  Travatan 0.004 %  Soln (Travoprost) .[name].. 1 Gtt Once Daily 7)  Freestyle Lite Test  Strp (Glucose Blood) .[name].. 1 Once Daily Dx Code 250.00 8)  Viagra 100 Mg Tabs (Sildenafil Citrate) .... Use As Directed \\par  9)  Cyanocobalamin 1000 Mcg/ml Soln (Cyanocobalamin) .... 1.5 Monthly  Current Medications (verified): 1)  Glucotrol 5 Mg Tabs (Glipizide) .[name].. 1 Once Daily 2)  Allopurinol 300 Mg Tabs (Allopurinol) .... Once Daily 3)  Diovan 80 Mg Tabs (Valsartan) .... Once Daily 4)  Flomax 0.4 Mg Cp24 (Tamsulosin Hcl) .... Once Daily 5)  Mobic 15 Mg  Tabs (Meloxicam) .... Once Daily 6)  Travatan 0.004 %   Soln (Travoprost) .[name].. 1 Gtt Once Daily 7)  Freestyle Lite Test  Strp (Glucose Blood) .[name].. 1 Once Daily Dx Code 250.00 8)  Viagra 100 Mg Tabs (Sildenafil Citrate) .... Use As Directed \\par  9)  Cyanocobalamin 1000 Mcg/ml Soln (Cyanocobalamin) .... 1.5 Monthly  Allergies (verified): No Known Drug Allergies  Past History:  Family History: Last updated: 13-Apr-2007 father died cad at 75 mother died 61 Family History Diabetes 1st degree relative  Social History: Last updated: April 13, 2007 Married Former Smoker Alcohol use-no Drug use-no Regular exercise-no  Risk Factors: Exercise: no (Apr 13, 2007)  Risk Factors: Smoking Status: quit (03/30/2010)  Past medical, surgical, family and social histories (including risk factors) reviewed, and no changes noted (except as noted below).  Past Medical History: Reviewed history from 09/12/2007 and no changes required. Diabetes mellitus, type II Gout Hypertension Osteoarthritis gluacoma  Past Surgical History: Reviewed history from 2007-04-13 and no changes required. Lumbar laminectomy Lumbar fusion Hemorrhoidectomy  Family History: Reviewed history from April 13, 2007 and no changes required. father died cad at 33 mother died 37 Family History Diabetes 1st degree relative  Social History: Reviewed history from 04-13-07 and no changes required. Married Former Smoker Alcohol use-no Drug use-no Regular exercise-no  Review of Systems  The patient denies anorexia, fever, weight loss, weight gain, vision loss, decreased hearing, hoarseness, chest pain, syncope, dyspnea on exertion, peripheral edema, prolonged cough, headaches, hemoptysis, abdominal pain, melena, hematochezia, severe indigestion/heartburn, hematuria, incontinence, genital sores, muscle weakness, suspicious skin lesions, transient blindness, difficulty walking, depression, unusual weight change, abnormal bleeding, enlarged lymph nodes, angioedema, breast masses, and  testicular masses.    Physical Exam  General:  Well-developed,well-nourished,in no acute distress; alert,appropriate and cooperative throughout examination Head:  normocephalic and male-pattern balding.   Eyes:  pupils equal and pupils round.   Ears:  R ear normal and L ear normal.   Nose:  no external deformity and no nasal discharge.   Mouth:  pharynx pink and moist and no erythema.   Neck:  supple and full ROM.   Chest Wall:  no deformities and no masses.   Breasts:  no masses and no adenopathy.   Lungs:  normal respiratory effort and no wheezes.   Abdomen:  Bowel sounds positive,abdomen soft and non-tender without masses, organomegaly or hernias noted. Rectal:  normal sphincter tone and no masses.   Prostate:  no asymmetry and 1+ enlarged.   Msk:  normal ROM, joint tenderness, and SI joint tenderness.   Extremities:  trace left pedal edema and trace right pedal edema.    Diabetes Management Exam:    Foot Exam (with socks and/or shoes not present):       Sensory-Pinprick/Light touch:          Left medial foot (L-4): normal          Left dorsal foot (L-5): normal          Left lateral foot (S-1): normal  Right medial foot (L-4): normal          Right dorsal foot (L-5): normal          Right lateral foot (S-1): normal       Sensory-Monofilament:          Left foot: normal          Right foot: normal       Inspection:          Left foot: normal          Right foot: normal       Nails:          Left foot: thickened          Right foot: thickened   Impression & Recommendations:  Problem # 1:  PREVENTIVE HEALTH CARE (ICD-V70.0) Assessment Unchanged The pt was asked about all immunizations, health maint. services that are appropriate to their age and was given guidance on diet exercize  and weight management  Colonoscopy: Diverticulosis (04/09/2007) Td Booster: Historical (03/09/2007)   Flu Vax: Fluvax 3+ (02/01/2010)   Pneumovax: Pneumovax (Medicare)  (03/24/2009) Chol: 184 (03/24/2009)   HDL: 54.10 (03/24/2009)   LDL: 116 (03/24/2009)   TG: 69.0 (03/24/2009) TSH: 1.01 (03/24/2009)   HgbA1C: 6.0 (09/29/2009)   PSA: 1.01 (03/24/2009) Next Colonoscopy due:: 04/2016 (03/24/2009)  Discussed using sunscreen, use of alcohol, drug use, self testicular exam, routine dental care, routine eye care, routine physical exam, seat belts, multiple vitamins, osteoporosis prevention, adequate calcium intake in diet, and recommendations for immunizations.  Discussed exercise and checking cholesterol.  Discussed gun safety, safe sex, and contraception. Also recommend checking PSA.  Problem # 2:  BACK PAIN (ICD-724.5)  His updated medication list for this problem includes:    Mobic 15 Mg Tabs (Meloxicam) ..... Once daily  Discussed use of moist heat or ice, modified activities, medications, and stretching/strengthening exercises. Back care instructions given. To be seen in 2 weeks if no improvement; sooner if worsening of symptoms.   Problem # 3:  DIABETES MELLITUS, TYPE II (ICD-250.00) Assessment: Unchanged  His updated medication list for this problem includes:    Glucotrol 5 Mg Tabs (Glipizide) .[name]... 1 once daily    Diovan 80 Mg Tabs (Valsartan) ..... Once daily  Labs Reviewed: Creat: 1.3 (03/24/2009)     Last Eye Exam: normal (09/09/2009) Reviewed HgBA1c results: 6.0 (09/29/2009)  5.7 (12/12/2008)  Problem # 4:  CLL (ICD-204.10) stable  Complete Medication List: 1)  Glucotrol 5 Mg Tabs (Glipizide) .[name].. 1 once daily 2)  Allopurinol 300 Mg Tabs (Allopurinol) .... Once daily 3)  Diovan 80 Mg Tabs (Valsartan) .... Once daily 4)  Flomax 0.4 Mg Cp24 (Tamsulosin hcl) .... Once daily 5)  Mobic 15 Mg Tabs (Meloxicam) .... Once daily 6)  Travatan 0.004 % Soln (Travoprost) .[name].. 1 gtt once daily 7)  Freestyle Lite Test Strp (Glucose blood) .[name].. 1 once daily dx code 250.00 8)  Viagra 100 Mg Tabs (Sildenafil citrate) .... Use as directed \\par  9)   Cyanocobalamin 1000 Mcg/ml Soln (Cyanocobalamin) .... 1.5 monthly  Other Orders: EKG w/ Interpretation (93000) Vit B12 1000 mcg (J3420) Admin of Therapeutic Inj  intramuscular or subcutaneous (11914)  Hypertension Assessment/Plan:      The patient's hypertensive risk group is category C: Target organ damage and/or diabetes.  His calculated 10 year risk of coronary heart disease is 40 %.  Today's blood pressure is 140/80.  His blood pressure goal is < 130/80.  Patient Instructions: 1)  Please schedule  a follow-up appointment in 6 months. 2)  Hepatic Panel prior to visit, ICD-9:995.20 3)  Lipid Panel prior to visit, ICD-9:272.4 4)  HbgA1C prior to visit, ICD-9:250.00 5)  Urine Microalbumin prior to visit, ICD-9:995.20   Medication Administration  Injection # 1:    Medication: Vit B12 1000 mcg    Diagnosis: B12 DEFICIENCY (ICD-266.2)    Route: IM    Site: L deltoid    Exp Date: 08/30/2011    Lot #: [id number]    Mfr: [employer]    Patient tolerated injection without complications    Given by: Willy Eddy, LPN (March 30, 2010 3:20 PM)  Orders Added: 1)  EKG w/ Interpretation [93000] 2)  Vit B12 1000 mcg [J3420] 3)  Admin of Therapeutic Inj  intramuscular or subcutaneous [96372] 4)  Est. Patient 65& > [99397] 5)  Est. Patient Level II [81191]

## 2010-06-09 ENCOUNTER — Ambulatory Visit: Payer: Self-pay | Admitting: Internal Medicine

## 2010-07-01 ENCOUNTER — Ambulatory Visit: Payer: Medicare Other | Admitting: Internal Medicine

## 2010-09-09 ENCOUNTER — Ambulatory Visit (INDEPENDENT_AMBULATORY_CARE_PROVIDER_SITE_OTHER): Payer: Medicare Other | Admitting: Internal Medicine

## 2010-09-09 DIAGNOSIS — E538 Deficiency of other specified B group vitamins: Secondary | ICD-10-CM

## 2010-09-09 MED ORDER — ALLOPURINOL 300 MG PO TABS: 300.0000 mg | ORAL_TABLET | Freq: Every day | ORAL | Status: DC

## 2010-09-09 MED ORDER — VALSARTAN 80 MG PO TABS: 80.0000 mg | ORAL_TABLET | Freq: Every day | ORAL | Status: DC

## 2010-09-09 MED ORDER — TAMSULOSIN HCL 0.4 MG PO CAPS: 0.4000 mg | ORAL_CAPSULE | Freq: Every day | ORAL | Status: DC

## 2010-09-09 MED ORDER — CYANOCOBALAMIN 1000 MCG/ML IJ SOLN
1000.0000 ug | Freq: Once | INTRAMUSCULAR | Status: AC
  Administered 2010-09-09: 1000 ug via INTRAMUSCULAR

## 2010-09-09 MED ORDER — MELOXICAM 15 MG PO TABS: 15.0000 mg | ORAL_TABLET | Freq: Every day | ORAL | Status: DC

## 2010-09-17 NOTE — Op Note (Signed)
[place]  Patient:    Kevin Rogers, Kevin Rogers                         MRN: [id number] Proc. Date: 08/30/99 Adm. Date:  [id number] Attending:  Gerald Dexter                           Operative Report  PREOPERATIVE DIAGNOSIS:  Spinal stenosis, L3-4 and L4-5 with central herniated isk at L4-5.  POSTOPERATIVE DIAGNOSIS:  Spinal stenosis, L3-4 and L4-5 with central herniated  disk at L4-5.  PROCEDURE:  Bilateral L4-5 and L3-4 laminectomy followed by bilateral diskectomy at L4-5 with the operating microscope.  SECONDARY PROCEDURE:  Microdissection, L4 and L5 nerve roots bilaterally as well as the L4-5 disk bilaterally.  SURGEON:  Reinaldo Meeker, M.D.  ASSISTANT:  Donzetta Sprung. Roney Jaffe. M.D.  PROCEDURE IN DETAIL:  After being placed in the prone position, the patients back was prepped and draped in the usual sterile fashion.  A midline incision was made above the spinous processes of L3, L4 and L5.  Using Bovie cutting current, the  incision was carried down to the spinous processes.  Subperiosteal dissection was then carried out bilaterally on the spinous processes, lamina and facet joints. A self retaining retractor was placed for exposure.  An x-ray was taken which showed the approach to the appropriate level.  The spinous processes and the interspinous ligament was removed at this time off of L3, L4 and L5.  Starting at L3-4, generous laminotomy was performed by removing the inferior 1/2 of the L3 lamina and the medial 1/3 of the facet joints and superior 1/2 of the L4 lamina.  Any residual  bone and ligamentum flavum were removed in a piece meal fashion.  A similar laminotomy was performed on the patients right side to complete the L3-4 decompression laminectomy.  At L4-5, a similar procedure was carried out, once again using the high speed drill to removed the inferior 1/2 of L4, the superior 1/2 of L5 and the medial 1/3 of the facet  joint.  This was done bilaterally. One gain, residual bone and ligamentum flavum were removed in a piece meal fashion. At this point, the entire L4 lamina was removed.  The microscope was then draped, brought onto the field and used for the remainder of the case.  Starting on the  patients right side, an L4-5 microdissection technique was used to identify the  lateral aspect of the thecal sac and L5 nerve roots.  Further coagulation was carried out down to the floor of the canal to identify the L4-5 disk.  After coagulating on the disk, the annulus was incised with a #10 blade.  Using pituitary rongeurs and curets, a very thorough disk space clean out was carried out while, at the same time, care was taken to avoid injury to the neural elements, and this as successfully done.  Inspection was then carried out on the patients left side to determine if the disk needed to be entered bilaterally.  It was found to be somewhat elevated, causing compression.  We therefore elected to go ahead and incise the annulus on this side.  The disk was then thoroughly cleaned out once  again.  At this point, inspection was carried out in all directions for any evidence of residual compression, and none could be identified.  Large amounts f irrigation were carried out and  any bleeding controlled with bipolar coagulation and Gelfoam.  The wound was then closed using interrupted Vicryl in the muscle,  fascia, subcutaneous and subcuticular tissues and staples on the skin.  A sterile dressing was then applied, the patient was extubated and taken to the recovery oom in stable condition. DD:  08/30/99 TD:  08/30/99 Job: [id number]

## 2010-09-20 ENCOUNTER — Encounter: Payer: Self-pay | Admitting: Internal Medicine

## 2010-09-21 ENCOUNTER — Other Ambulatory Visit (INDEPENDENT_AMBULATORY_CARE_PROVIDER_SITE_OTHER): Payer: Medicare Other | Admitting: Internal Medicine

## 2010-09-21 DIAGNOSIS — E785 Hyperlipidemia, unspecified: Secondary | ICD-10-CM

## 2010-09-21 DIAGNOSIS — T887XXA Unspecified adverse effect of drug or medicament, initial encounter: Secondary | ICD-10-CM

## 2010-09-21 DIAGNOSIS — IMO0001 Reserved for inherently not codable concepts without codable children: Secondary | ICD-10-CM

## 2010-09-21 LAB — HEMOGLOBIN A1C: Hgb A1c MFr Bld: 6.3 % (ref 4.6–6.5)

## 2010-09-21 LAB — LIPID PANEL
Cholesterol: 179 mg/dL (ref 0–200)
HDL: 52.7 mg/dL (ref >39.00)
LDL Cholesterol: 100 mg/dL — ABNORMAL HIGH (ref 0–99)
Total CHOL/HDL Ratio: 3
Triglycerides: 131 mg/dL (ref 0.0–149.0)
VLDL: 26.2 mg/dL (ref 0.0–40.0)

## 2010-09-21 LAB — HEPATIC FUNCTION PANEL
ALT: 14 U/L (ref 0–53)
AST: 17 U/L (ref 0–37)
Albumin: 3.8 g/dL (ref 3.5–5.2)
Alkaline Phosphatase: 69 U/L (ref 39–117)
Bilirubin, Direct: 0.1 mg/dL (ref 0.0–0.3)
Total Bilirubin: 0.9 mg/dL (ref 0.3–1.2)
Total Protein: 6.2 g/dL (ref 6.0–8.3)

## 2010-09-21 LAB — MICROALBUMIN / CREATININE URINE RATIO
Creatinine,U: 79.3 mg/dL
Microalb Creat Ratio: 3.8 mg/g (ref 0.0–30.0)
Microalb, Ur: 3 mg/dL — ABNORMAL HIGH (ref 0.0–1.9)

## 2010-09-28 ENCOUNTER — Ambulatory Visit (INDEPENDENT_AMBULATORY_CARE_PROVIDER_SITE_OTHER): Payer: Medicare Other | Admitting: Internal Medicine

## 2010-09-28 ENCOUNTER — Encounter: Payer: Self-pay | Admitting: Internal Medicine

## 2010-09-28 DIAGNOSIS — I10 Essential (primary) hypertension: Secondary | ICD-10-CM

## 2010-09-28 DIAGNOSIS — C9110 Chronic lymphocytic leukemia of B-cell type not having achieved remission: Secondary | ICD-10-CM

## 2010-09-28 DIAGNOSIS — E119 Type 2 diabetes mellitus without complications: Secondary | ICD-10-CM

## 2010-09-28 DIAGNOSIS — E1149 Type 2 diabetes mellitus with other diabetic neurological complication: Secondary | ICD-10-CM

## 2010-09-28 DIAGNOSIS — E1142 Type 2 diabetes mellitus with diabetic polyneuropathy: Secondary | ICD-10-CM

## 2010-09-28 DIAGNOSIS — Z0000 Encounter for general adult medical examination without abnormal findings: Secondary | ICD-10-CM

## 2010-09-28 NOTE — Assessment & Plan Note (Signed)
stable on low dose diovan

## 2010-09-28 NOTE — Progress Notes (Signed)
  Subjective:    Patient ID: Kevin Rogers, male    DOB: [birth year], 77 y.o.   MRN: [id number]  HPI    Review of Systems  Constitutional: Negative for fever and fatigue.  HENT: Negative for hearing loss, congestion, neck pain and postnasal drip.   Eyes: Negative for discharge, redness and visual disturbance.  Respiratory: Negative for cough, shortness of breath and wheezing.   Cardiovascular: Negative for leg swelling.  Gastrointestinal: Negative for abdominal pain, constipation and abdominal distention.  Genitourinary: Negative for urgency and frequency.  Musculoskeletal: Negative for joint swelling and arthralgias.  Skin: Negative for color change and rash.  Neurological: Negative for weakness and light-headedness.  Hematological: Negative for adenopathy.  Psychiatric/Behavioral: Negative for behavioral problems.   Past Medical History  Diagnosis Date  . Diabetes mellitus   . Hyperlipidemia   . Hypertension    Past Surgical History  Procedure Date  . Lumbar laminectomy 1997  . Hernia repair 1989    reports that he quit smoking about 15 years ago. His smoking use included Cigarettes. He has never used smokeless tobacco. He reports that he drinks about .6 ounces of alcohol per week. He reports that he does not use illicit drugs. family history includes Heart disease in his father and mother. No Known Allergies     Objective:   Physical Exam  Constitutional: He appears well-developed and well-nourished.  HENT:  Head: Normocephalic and atraumatic.  Eyes: Conjunctivae are normal. Pupils are equal, round, and reactive to light.  Neck: Normal range of motion. Neck supple.  Cardiovascular: Normal rate and regular rhythm.   Pulmonary/Chest: Effort normal and breath sounds normal.  Abdominal: Soft. Bowel sounds are normal.  Skin: Skin is warm and dry.  Psychiatric: He has a normal mood and affect. His behavior is normal.          Assessment & Plan:

## 2010-09-28 NOTE — Assessment & Plan Note (Signed)
Has an appointment with oncology in august

## 2010-09-28 NOTE — Assessment & Plan Note (Signed)
The pt reports stable reading in the 115 range ( average) Aic is stable

## 2010-12-03 ENCOUNTER — Ambulatory Visit (INDEPENDENT_AMBULATORY_CARE_PROVIDER_SITE_OTHER): Payer: Medicare Other | Admitting: Internal Medicine

## 2010-12-03 DIAGNOSIS — D518 Other vitamin B12 deficiency anemias: Secondary | ICD-10-CM

## 2010-12-03 DIAGNOSIS — D519 Vitamin B12 deficiency anemia, unspecified: Secondary | ICD-10-CM

## 2010-12-03 MED ORDER — CYANOCOBALAMIN 1000 MCG/ML IJ SOLN
1000.0000 ug | INTRAMUSCULAR | Status: DC
  Administered 2010-12-03: 1000 ug via INTRAMUSCULAR

## 2011-01-14 ENCOUNTER — Other Ambulatory Visit (HOSPITAL_COMMUNITY): Payer: Self-pay | Admitting: Oncology

## 2011-01-14 ENCOUNTER — Encounter (HOSPITAL_BASED_OUTPATIENT_CLINIC_OR_DEPARTMENT_OTHER): Payer: Medicare Other | Admitting: Oncology

## 2011-01-14 DIAGNOSIS — D649 Anemia, unspecified: Secondary | ICD-10-CM

## 2011-01-14 DIAGNOSIS — C9110 Chronic lymphocytic leukemia of B-cell type not having achieved remission: Secondary | ICD-10-CM

## 2011-01-14 LAB — COMPREHENSIVE METABOLIC PANEL
ALT: 14 U/L (ref 0–53)
AST: 19 U/L (ref 0–37)
Albumin: 3.8 g/dL (ref 3.5–5.2)
Alkaline Phosphatase: 76 U/L (ref 39–117)
BUN: 18 mg/dL (ref 6–23)
CO2: 22 mEq/L (ref 19–32)
Calcium: 9.3 mg/dL (ref 8.4–10.5)
Chloride: 109 mEq/L (ref 96–112)
Creatinine, Ser: 1.61 mg/dL — ABNORMAL HIGH (ref 0.50–1.35)
Glucose, Bld: 211 mg/dL — ABNORMAL HIGH (ref 70–99)
Potassium: 4.9 mEq/L (ref 3.5–5.3)
Sodium: 140 mEq/L (ref 135–145)
Total Bilirubin: 0.8 mg/dL (ref 0.3–1.2)
Total Protein: 6.2 g/dL (ref 6.0–8.3)

## 2011-01-14 LAB — CBC WITH DIFFERENTIAL/PLATELET
BASO%: 0.1 % (ref 0.0–2.0)
Basophils Absolute: 0 10*3/uL (ref 0.0–0.1)
EOS%: 1.5 % (ref 0.0–7.0)
Eosinophils Absolute: 0.2 10*3/uL (ref 0.0–0.5)
HCT: 36.5 % — ABNORMAL LOW (ref 38.4–49.9)
HGB: 12.2 g/dL — ABNORMAL LOW (ref 13.0–17.1)
LYMPH%: 73 % — ABNORMAL HIGH (ref 14.0–49.0)
MCH: 29.2 pg (ref 27.2–33.4)
MCHC: 33.4 g/dL (ref 32.0–36.0)
MCV: 87.3 fL (ref 79.3–98.0)
MONO#: 0.3 10*3/uL (ref 0.1–0.9)
MONO%: 2 % (ref 0.0–14.0)
NEUT#: 3.4 10*3/uL (ref 1.5–6.5)
NEUT%: 23.4 % — ABNORMAL LOW (ref 39.0–75.0)
Platelets: 154 10*3/uL (ref 140–400)
RBC: 4.18 10*6/uL — ABNORMAL LOW (ref 4.20–5.82)
RDW: 14.3 % (ref 11.0–14.6)
WBC: 14.5 10*3/uL — ABNORMAL HIGH (ref 4.0–10.3)
lymph#: 10.6 10*3/uL — ABNORMAL HIGH (ref 0.9–3.3)
nRBC: 0 % (ref 0–0)

## 2011-01-14 LAB — LACTATE DEHYDROGENASE: LDH: 104 U/L (ref 94–250)

## 2011-01-28 ENCOUNTER — Other Ambulatory Visit: Payer: Self-pay | Admitting: Internal Medicine

## 2011-02-10 ENCOUNTER — Ambulatory Visit (INDEPENDENT_AMBULATORY_CARE_PROVIDER_SITE_OTHER): Payer: Medicare Other | Admitting: Internal Medicine

## 2011-02-10 DIAGNOSIS — Z23 Encounter for immunization: Secondary | ICD-10-CM

## 2011-02-10 DIAGNOSIS — D518 Other vitamin B12 deficiency anemias: Secondary | ICD-10-CM

## 2011-02-10 DIAGNOSIS — D519 Vitamin B12 deficiency anemia, unspecified: Secondary | ICD-10-CM

## 2011-02-10 MED ORDER — CYANOCOBALAMIN 1000 MCG/ML IJ SOLN
1000.0000 ug | INTRAMUSCULAR | Status: DC
  Administered 2011-02-10: 1000 ug via INTRAMUSCULAR

## 2011-03-07 ENCOUNTER — Ambulatory Visit (INDEPENDENT_AMBULATORY_CARE_PROVIDER_SITE_OTHER): Payer: Medicare Other | Admitting: Internal Medicine

## 2011-03-07 DIAGNOSIS — D518 Other vitamin B12 deficiency anemias: Secondary | ICD-10-CM

## 2011-03-07 DIAGNOSIS — D519 Vitamin B12 deficiency anemia, unspecified: Secondary | ICD-10-CM

## 2011-03-07 MED ORDER — CYANOCOBALAMIN 1000 MCG/ML IJ SOLN
1000.0000 ug | INTRAMUSCULAR | Status: DC
  Administered 2011-03-07: 1000 ug via INTRAMUSCULAR

## 2011-03-17 ENCOUNTER — Other Ambulatory Visit (INDEPENDENT_AMBULATORY_CARE_PROVIDER_SITE_OTHER): Payer: Medicare Other

## 2011-03-17 DIAGNOSIS — E119 Type 2 diabetes mellitus without complications: Secondary | ICD-10-CM

## 2011-03-17 DIAGNOSIS — Z0000 Encounter for general adult medical examination without abnormal findings: Secondary | ICD-10-CM

## 2011-03-17 DIAGNOSIS — Z125 Encounter for screening for malignant neoplasm of prostate: Secondary | ICD-10-CM

## 2011-03-17 LAB — HEPATIC FUNCTION PANEL
ALT: 17 U/L (ref 0–53)
AST: 17 U/L (ref 0–37)
Albumin: 3.8 g/dL (ref 3.5–5.2)
Alkaline Phosphatase: 70 U/L (ref 39–117)
Bilirubin, Direct: 0.2 mg/dL (ref 0.0–0.3)
Total Bilirubin: 1.3 mg/dL — ABNORMAL HIGH (ref 0.3–1.2)
Total Protein: 6.6 g/dL (ref 6.0–8.3)

## 2011-03-17 LAB — BASIC METABOLIC PANEL
BUN: 18 mg/dL (ref 6–23)
CO2: 30 mEq/L (ref 19–32)
Calcium: 9.5 mg/dL (ref 8.4–10.5)
Chloride: 108 mEq/L (ref 96–112)
Creatinine, Ser: 1.4 mg/dL (ref 0.4–1.5)
GFR: 62.67 mL/min (ref >60.00)
Glucose, Bld: 103 mg/dL — ABNORMAL HIGH (ref 70–99)
Potassium: 4.9 mEq/L (ref 3.5–5.1)
Sodium: 143 mEq/L (ref 135–145)

## 2011-03-17 LAB — POCT URINALYSIS DIPSTICK
Bilirubin, UA: NEGATIVE
Blood, UA: NEGATIVE
Glucose, UA: NEGATIVE
Ketones, UA: NEGATIVE
Leukocytes, UA: NEGATIVE
Nitrite, UA: NEGATIVE
Protein, UA: NEGATIVE
Spec Grav, UA: 1.015
Urobilinogen, UA: 0.2
pH, UA: 5.5

## 2011-03-17 LAB — MICROALBUMIN / CREATININE URINE RATIO
Creatinine,U: 108.1 mg/dL
Microalb Creat Ratio: 0.7 mg/g (ref 0.0–30.0)
Microalb, Ur: 0.8 mg/dL (ref 0.0–1.9)

## 2011-03-17 LAB — LIPID PANEL
Cholesterol: 192 mg/dL (ref 0–200)
HDL: 47.7 mg/dL (ref >39.00)
LDL Cholesterol: 119 mg/dL — ABNORMAL HIGH (ref 0–99)
Total CHOL/HDL Ratio: 4
Triglycerides: 129 mg/dL (ref 0.0–149.0)
VLDL: 25.8 mg/dL (ref 0.0–40.0)

## 2011-03-17 LAB — HEMOGLOBIN A1C: Hgb A1c MFr Bld: 6.1 % (ref 4.6–6.5)

## 2011-03-18 LAB — CBC WITH DIFFERENTIAL/PLATELET
Basophils Absolute: 0 10*3/uL (ref 0.0–0.1)
Basophils Relative: 0.2 % (ref 0.0–3.0)
Eosinophils Absolute: 0.1 10*3/uL (ref 0.0–0.7)
Eosinophils Relative: 0.8 % (ref 0.0–5.0)
HCT: 40.9 % (ref 39.0–52.0)
Hemoglobin: 13.1 g/dL (ref 13.0–17.0)
Lymphocytes Relative: 73.6 % — ABNORMAL HIGH (ref 12.0–46.0)
Lymphs Abs: 11.7 10*3/uL — ABNORMAL HIGH (ref 0.7–4.0)
MCHC: 32.1 g/dL (ref 30.0–36.0)
MCV: 93 fl (ref 78.0–100.0)
Monocytes Absolute: 0.4 10*3/uL (ref 0.1–1.0)
Monocytes Relative: 2.4 % — ABNORMAL LOW (ref 3.0–12.0)
Neutro Abs: 3.7 10*3/uL (ref 1.4–7.7)
Neutrophils Relative %: 23 % — ABNORMAL LOW (ref 43.0–77.0)
Platelets: 159 10*3/uL (ref 150.0–400.0)
RBC: 4.4 Mil/uL (ref 4.22–5.81)
RDW: 15.1 % — ABNORMAL HIGH (ref 11.5–14.6)
WBC: 16 10*3/uL — ABNORMAL HIGH (ref 4.5–10.5)

## 2011-03-18 LAB — PSA: PSA: 0.43 ng/mL (ref 0.10–4.00)

## 2011-03-18 LAB — TSH: TSH: 1.53 u[IU]/mL (ref 0.35–5.50)

## 2011-03-31 ENCOUNTER — Telehealth: Payer: Self-pay | Admitting: *Deleted

## 2011-03-31 NOTE — Telephone Encounter (Signed)
error 

## 2011-04-01 ENCOUNTER — Encounter: Payer: Medicare Other | Admitting: Internal Medicine

## 2011-04-05 ENCOUNTER — Encounter: Payer: Self-pay | Admitting: Internal Medicine

## 2011-04-05 ENCOUNTER — Ambulatory Visit (INDEPENDENT_AMBULATORY_CARE_PROVIDER_SITE_OTHER): Payer: Medicare Other | Admitting: Internal Medicine

## 2011-04-05 VITALS — BP 124/78 | HR 72 | Temp 98.2°F | Resp 16 | Ht 72.0 in | Wt 268.0 lb

## 2011-04-05 DIAGNOSIS — E785 Hyperlipidemia, unspecified: Secondary | ICD-10-CM

## 2011-04-05 DIAGNOSIS — Z0000 Encounter for general adult medical examination without abnormal findings: Secondary | ICD-10-CM

## 2011-04-05 DIAGNOSIS — I10 Essential (primary) hypertension: Secondary | ICD-10-CM

## 2011-04-05 DIAGNOSIS — I509 Heart failure, unspecified: Secondary | ICD-10-CM

## 2011-04-05 DIAGNOSIS — R9431 Abnormal electrocardiogram [ECG] [EKG]: Secondary | ICD-10-CM

## 2011-04-05 DIAGNOSIS — D518 Other vitamin B12 deficiency anemias: Secondary | ICD-10-CM

## 2011-04-05 DIAGNOSIS — D519 Vitamin B12 deficiency anemia, unspecified: Secondary | ICD-10-CM

## 2011-04-05 MED ORDER — CYANOCOBALAMIN 1000 MCG/ML IJ SOLN
1000.0000 ug | INTRAMUSCULAR | Status: DC
  Administered 2011-04-05 – 2011-07-05 (×2): 1000 ug via INTRAMUSCULAR

## 2011-04-05 NOTE — Patient Instructions (Addendum)
You will be called for a stress test   Take the livalo one a week

## 2011-04-05 NOTE — Progress Notes (Signed)
Subjective:    Patient ID: Kevin Rogers, male    DOB: Apr 01, 1932, 77 y.o.   MRN: [id number]  HPI CPX Pt has noted edema in LE most prominent at end of day swelling over sock Improves after laying down... Noted for the last year Has noted any SOB, chest pains of irregular heart rate Has not had a stress test Has EKG with pauses possible HB new?   Review of Systems  Constitutional: Negative for fever and fatigue.  HENT: Negative for hearing loss, congestion, neck pain and postnasal drip.   Eyes: Negative for discharge, redness and visual disturbance.  Respiratory: Negative for cough, shortness of breath and wheezing.   Cardiovascular: Positive for palpitations and leg swelling.  Gastrointestinal: Negative for abdominal pain, constipation and abdominal distention.  Genitourinary: Negative for urgency and frequency.  Musculoskeletal: Negative for joint swelling and arthralgias.  Skin: Negative for color change and rash.  Neurological: Negative for weakness and light-headedness.  Hematological: Negative for adenopathy.  Psychiatric/Behavioral: Negative for behavioral problems.   Past Medical History  Diagnosis Date  . Diabetes mellitus   . Hyperlipidemia   . Hypertension     History   Social History  . Marital Status: Married    Spouse Name: N/A    Number of Children: N/A  . Years of Education: N/A   Occupational History  . Not on file.   Social History Main Topics  . Smoking status: Former Smoker    Types: Cigarettes    Quit date: 09/28/1995  . Smokeless tobacco: Never Used  . Alcohol Use: 0.6 oz/week    1 Glasses of wine per week  . Drug Use: No  . Sexually Active: Yes   Other Topics Concern  . Not on file   Social History Narrative  . No narrative on file    Past Surgical History  Procedure Date  . Lumbar laminectomy 1997  . Hernia repair 1989    Family History  Problem Relation Age of Onset  . Heart disease Mother   . Heart disease Father      No Known Allergies  Current Outpatient Prescriptions on File Prior to Visit  Medication Sig Dispense Refill  . allopurinol (ZYLOPRIM) 300 MG tablet Take 1 tablet (300 mg total) by mouth daily.  90 tablet  3  . glipiZIDE (GLUCOTROL XL) 5 MG 24 hr tablet TAKE 1 TABLET EVERY DAY  90 tablet  2  . meloxicam (MOBIC) 15 MG tablet Take 1 tablet (15 mg total) by mouth daily.  90 tablet  3  . Tamsulosin HCl (FLOMAX) 0.4 MG CAPS Take 1 capsule (0.4 mg total) by mouth daily.  90 capsule  3  . travoprost, benzalkonium, (TRAVATAN) 0.004 % ophthalmic solution 1 drop at bedtime.        . valsartan (DIOVAN) 80 MG tablet Take 1 tablet (80 mg total) by mouth daily.  90 tablet  3   Current Facility-Administered Medications on File Prior to Visit  Medication Dose Route Frequency Provider Last Rate Last Dose  . DISCONTD: cyanocobalamin ((VITAMIN B-12)) injection 1,000 mcg  1,000 mcg Intramuscular Q30 days Carrie Mew   1,000 mcg at 12/03/10 1326  . DISCONTD: cyanocobalamin ((VITAMIN B-12)) injection 1,000 mcg  1,000 mcg Intramuscular Q30 days Carrie Mew   1,000 mcg at 02/10/11 1206  . DISCONTD: cyanocobalamin ((VITAMIN B-12)) injection 1,000 mcg  1,000 mcg Intramuscular Q30 days Carrie Mew   1,000 mcg at 03/07/11 1211    BP 124/78  Pulse  72  Temp 98.2 F (36.8 C)  Resp 16  Ht 6' (1.829 m)  Wt 268 lb (121.564 kg)  BMI 36.35 kg/m2    .    Objective:   Physical Exam  Nursing note and vitals reviewed. Constitutional: He appears well-developed and well-nourished.  HENT:  Head: Normocephalic and atraumatic.  Eyes: Conjunctivae are normal. Pupils are equal, round, and reactive to light.  Neck: Normal range of motion. Neck supple.  Cardiovascular: Normal rate and regular rhythm.   Pulmonary/Chest: Effort normal and breath sounds normal.  Abdominal: Soft. Bowel sounds are normal.  Genitourinary: Rectum normal and prostate normal.  Musculoskeletal: Normal range of motion.  He exhibits edema.  Neurological: He is alert.  Skin: Skin is warm and dry.  Psychiatric: He has a normal mood and affect. His behavior is normal.          Assessment & Plan:   Patient presents for yearly preventative medicine examination.   all immunizations and health maintenance protocols were reviewed with the patient and they are up to date with these protocols.   screening laboratory values were reviewed with the patient including screening of hyperlipidemia PSA renal function and hepatic function.   There medications past medical history social history problem list and allergies were reviewed in detail.   Goals were established with regard to weight loss exercise diet in compliance with medications The pts CLL is stable The pts anemia is improved with stable platelets The pts lipids have worsened with increased ldl above 100 with weight gain Increased LE edema Pauses seen on EKG possible ICHB

## 2011-04-11 ENCOUNTER — Ambulatory Visit (HOSPITAL_COMMUNITY): Payer: Medicare Other | Attending: Internal Medicine | Admitting: Radiology

## 2011-04-11 DIAGNOSIS — R0602 Shortness of breath: Secondary | ICD-10-CM | POA: Insufficient documentation

## 2011-04-11 DIAGNOSIS — Z8249 Family history of ischemic heart disease and other diseases of the circulatory system: Secondary | ICD-10-CM | POA: Insufficient documentation

## 2011-04-11 DIAGNOSIS — R9431 Abnormal electrocardiogram [ECG] [EKG]: Secondary | ICD-10-CM | POA: Insufficient documentation

## 2011-04-11 DIAGNOSIS — R0609 Other forms of dyspnea: Secondary | ICD-10-CM | POA: Insufficient documentation

## 2011-04-11 DIAGNOSIS — I509 Heart failure, unspecified: Secondary | ICD-10-CM | POA: Insufficient documentation

## 2011-04-11 DIAGNOSIS — I10 Essential (primary) hypertension: Secondary | ICD-10-CM | POA: Insufficient documentation

## 2011-04-11 DIAGNOSIS — Z87891 Personal history of nicotine dependence: Secondary | ICD-10-CM | POA: Insufficient documentation

## 2011-04-11 DIAGNOSIS — R0989 Other specified symptoms and signs involving the circulatory and respiratory systems: Secondary | ICD-10-CM | POA: Insufficient documentation

## 2011-04-11 DIAGNOSIS — E119 Type 2 diabetes mellitus without complications: Secondary | ICD-10-CM | POA: Insufficient documentation

## 2011-04-11 MED ORDER — TECHNETIUM TC 99M TETROFOSMIN IV KIT
30.0000 | PACK | Freq: Once | INTRAVENOUS | Status: AC | PRN
  Administered 2011-04-11: 30 via INTRAVENOUS

## 2011-04-11 MED ORDER — TECHNETIUM TC 99M TETROFOSMIN IV KIT
10.0000 | PACK | Freq: Once | INTRAVENOUS | Status: AC | PRN
  Administered 2011-04-11: 10 via INTRAVENOUS

## 2011-04-11 NOTE — Progress Notes (Signed)
[facility] SITE 3 NUCLEAR MED [address] [place] [address] [phone]  Cardiology Nuclear Med Study  Male Minish is a 77 y.o. male [id number] 12-Jul-1931   Nuclear Med Background Indication for Stress Test:  Evaluation for Ischemia and Abnormal EKG History:  No previous documented CAD Cardiac Risk Factors: Family History - CAD, History of Smoking, Hypertension and NIDDM  Symptoms:  DOE and SOB   Nuclear Pre-Procedure Caffeine/Decaff Intake:  None NPO After: 8:00am   Lungs:  Clear IV 0.9% NS with Angio Cath:  20g  IV Site: R Wrist  IV Started by:  Stanton Kidney, EMT-P  Chest Size (in):  52 Cup Size: n/a  Height: 6' (1.829 m)  Weight:  268 lb (121.564 kg)  BMI:  Body mass index is 36.35 kg/(m^2). Tech Comments:  CBG=112 @ 7:15 am, per patient.    Nuclear Med Study 1 or 2 day study: 1 day  Stress Test Type:  Stress  Reading MD: Dietrich Pates, MD  Order Authorizing Provider:  Darryll Capers, MD  Resting Radionuclide: Technetium 34m Tetrofosmin  Resting Radionuclide Dose: 11.0 mCi   Stress Radionuclide:  Technetium 13m Tetrofosmin  Stress Radionuclide Dose: 33.0 mCi           Stress Protocol Rest HR: 59 Stress HR: 129  Rest BP: 118/75 Stress BP: 205/86  Exercise Time (min): 5:00 METS: 7.0   Predicted Max HR: 143 bpm % Max HR: 90.21 bpm Rate Pressure Product: 21308   Dose of Adenosine (mg):  n/a Dose of Lexiscan: n/a mg  Dose of Atropine (mg): n/a Dose of Dobutamine: n/a mcg/kg/min (at max HR)  Stress Test Technologist: Irean Hong, RN  Nuclear Technologist:  Domenic Polite, CNMT     Rest Procedure:  Myocardial perfusion imaging was performed at rest 45 minutes following the intravenous administration of Technetium 56m Tetrofosmin. Rest ECG: NSR with nonspecific T wave changes  Stress Procedure:  The patient exercised for 5 minutes, RPE 15.  The patient stopped due to DOE and denied any chest pain.  There were no significant changes from  baseline T wave changes.  There was a hypertensive response to exercise.Technetium 16m Tetrofosmin was injected at peak exercise and myocardial perfusion imaging was performed after a brief delay. Stress ECG: No significant change from baseline ECG  QPS Raw Data Images:  Extensive soft tissue (diaphragm, subcutaneous fat) surround heart. Stress Images:  Normal homogeneous uptake in all areas of the myocardium. Rest Images:  Normal homogeneous uptake in all areas of the myocardium. Subtraction (SDS):  No evidence of ischemia. Transient Ischemic Dilatation (Normal <1.22):  0.90 Lung/Heart Ratio (Normal <0.45):  0.32  Quantitative Gated Spect Images QGS EDV:  112 ml QGS ESV:  39 ml QGS cine images:  NL LV Function; NL Wall Motion QGS EF: 65%  Impression Exercise Capacity:  Fair exercise capacity. BP Response:  Normal blood pressure response. Clinical Symptoms:  No chest pain. ECG Impression:  No significant ST segment change suggestive of ischemia. Comparison with Prior Nuclear Study: No previous nuclear study performed  Overall Impression:  Normal stress nuclear study.  LVEF 65% Dietrich Pates

## 2011-05-17 ENCOUNTER — Ambulatory Visit (INDEPENDENT_AMBULATORY_CARE_PROVIDER_SITE_OTHER): Payer: Medicare Other | Admitting: Internal Medicine

## 2011-05-17 DIAGNOSIS — D518 Other vitamin B12 deficiency anemias: Secondary | ICD-10-CM

## 2011-05-17 DIAGNOSIS — D519 Vitamin B12 deficiency anemia, unspecified: Secondary | ICD-10-CM

## 2011-05-17 MED ORDER — CYANOCOBALAMIN 1000 MCG/ML IJ SOLN
1000.0000 ug | Freq: Once | INTRAMUSCULAR | Status: AC
  Administered 2011-05-17: 1000 ug via INTRAMUSCULAR

## 2011-05-31 ENCOUNTER — Telehealth: Payer: Self-pay | Admitting: Internal Medicine

## 2011-05-31 MED ORDER — LOSARTAN POTASSIUM 100 MG PO TABS: 100.0000 mg | ORAL_TABLET | Freq: Every day | ORAL | Status: DC

## 2011-05-31 NOTE — Telephone Encounter (Signed)
Per dr Lovell Sheehan- m;ay change to cozaar 100 qd, but dr Lovell Sheehan states they generics dont always work as well, so please monitor bp twice a day for 1 month and let dr Lovell Sheehan know the reading

## 2011-05-31 NOTE — Telephone Encounter (Signed)
Notified pt. 

## 2011-05-31 NOTE — Telephone Encounter (Signed)
Pt called and wants to discuss changing the valsartan (DIOVAN) 80 MG tablet, to a cheaper alternative. This med is too expensive. Pt req call back from nurse.

## 2011-05-31 NOTE — Telephone Encounter (Signed)
Thanks

## 2011-07-05 ENCOUNTER — Ambulatory Visit (INDEPENDENT_AMBULATORY_CARE_PROVIDER_SITE_OTHER): Payer: Medicare Other | Admitting: Internal Medicine

## 2011-07-05 ENCOUNTER — Encounter: Payer: Self-pay | Admitting: Internal Medicine

## 2011-07-05 VITALS — BP 130/80 | HR 76 | Temp 98.2°F | Resp 16 | Ht 72.0 in | Wt 268.0 lb

## 2011-07-05 DIAGNOSIS — D519 Vitamin B12 deficiency anemia, unspecified: Secondary | ICD-10-CM

## 2011-07-05 DIAGNOSIS — IMO0001 Reserved for inherently not codable concepts without codable children: Secondary | ICD-10-CM

## 2011-07-05 DIAGNOSIS — D518 Other vitamin B12 deficiency anemias: Secondary | ICD-10-CM

## 2011-07-05 DIAGNOSIS — I10 Essential (primary) hypertension: Secondary | ICD-10-CM

## 2011-07-05 DIAGNOSIS — T887XXA Unspecified adverse effect of drug or medicament, initial encounter: Secondary | ICD-10-CM

## 2011-07-05 DIAGNOSIS — E785 Hyperlipidemia, unspecified: Secondary | ICD-10-CM

## 2011-07-05 LAB — LIPID PANEL
Cholesterol: 150 mg/dL (ref 0–200)
HDL: 56.2 mg/dL (ref >39.00)
LDL Cholesterol: 82 mg/dL (ref 0–99)
Total CHOL/HDL Ratio: 3
Triglycerides: 57 mg/dL (ref 0.0–149.0)
VLDL: 11.4 mg/dL (ref 0.0–40.0)

## 2011-07-05 LAB — HEPATIC FUNCTION PANEL
ALT: 12 U/L (ref 0–53)
AST: 16 U/L (ref 0–37)
Albumin: 4 g/dL (ref 3.5–5.2)
Alkaline Phosphatase: 69 U/L (ref 39–117)
Bilirubin, Direct: 0.1 mg/dL (ref 0.0–0.3)
Total Bilirubin: 0.9 mg/dL (ref 0.3–1.2)
Total Protein: 6.7 g/dL (ref 6.0–8.3)

## 2011-07-05 LAB — BASIC METABOLIC PANEL
BUN: 15 mg/dL (ref 6–23)
CO2: 28 mEq/L (ref 19–32)
Calcium: 9.6 mg/dL (ref 8.4–10.5)
Chloride: 105 mEq/L (ref 96–112)
Creatinine, Ser: 1.5 mg/dL (ref 0.4–1.5)
GFR: 59.22 mL/min — ABNORMAL LOW (ref >60.00)
Glucose, Bld: 97 mg/dL (ref 70–99)
Potassium: 5 mEq/L (ref 3.5–5.1)
Sodium: 137 mEq/L (ref 135–145)

## 2011-07-05 LAB — HEMOGLOBIN A1C: Hgb A1c MFr Bld: 6.1 % (ref 4.6–6.5)

## 2011-07-05 MED ORDER — TAMSULOSIN HCL 0.4 MG PO CAPS: 0.4000 mg | ORAL_CAPSULE | Freq: Every day | ORAL | Status: DC

## 2011-07-05 MED ORDER — CYANOCOBALAMIN 1000 MCG/ML IJ SOLN: 1000.0000 ug | INTRAMUSCULAR | Status: DC

## 2011-07-05 MED ORDER — LOSARTAN POTASSIUM 100 MG PO TABS: 100.0000 mg | ORAL_TABLET | Freq: Every day | ORAL | Status: DC

## 2011-07-05 MED ORDER — GLIPIZIDE ER 5 MG PO TB24: 5.0000 mg | ORAL_TABLET | Freq: Every day | ORAL | Status: DC

## 2011-07-05 MED ORDER — ALLOPURINOL 300 MG PO TABS: 300.0000 mg | ORAL_TABLET | Freq: Every day | ORAL | Status: DC

## 2011-07-05 MED ORDER — MELOXICAM 15 MG PO TABS: 15.0000 mg | ORAL_TABLET | Freq: Every day | ORAL | Status: DC

## 2011-07-05 NOTE — Progress Notes (Signed)
Subjective:    Patient ID: Kevin Rogers, male    DOB: 01/29/1932, 78 y.o.   MRN: [id number]  HPI Kevin Rogers is a 78 year old male who presents for followup of adult-onset diabetes hypertension and who also has a history of osteoarthritis.  Today he presents and has received a B12 shot prior to his office visit his blood pressure was checked at 130/80 and he is afebrile.  His weight is 268 pounds which is stable. Today he presents for hemoglobin A1c a lipid and a liver panel to monitor his cholesterol and his diabetes as well as a CBC differential to monitor history of elevated white count.   Review of Systems  Constitutional: Negative for fever and fatigue.  HENT: Negative for hearing loss, congestion, neck pain and postnasal drip.   Eyes: Negative for discharge, redness and visual disturbance.  Respiratory: Negative for cough, shortness of breath and wheezing.   Cardiovascular: Negative for leg swelling.  Gastrointestinal: Negative for abdominal pain, constipation and abdominal distention.  Genitourinary: Negative for urgency and frequency.  Musculoskeletal: Negative for joint swelling and arthralgias.  Skin: Negative for color change and rash.  Neurological: Negative for weakness and light-headedness.  Hematological: Negative for adenopathy.  Psychiatric/Behavioral: Negative for behavioral problems.       Past Medical History  Diagnosis Date  . Diabetes mellitus   . Hyperlipidemia   . Hypertension     History   Social History  . Marital Status: Married    Spouse Name: N/A    Number of Children: N/A  . Years of Education: N/A   Occupational History  . Not on file.   Social History Main Topics  . Smoking status: Former Smoker    Types: Cigarettes    Quit date: 09/28/1995  . Smokeless tobacco: Never Used  . Alcohol Use: 0.6 oz/week    1 Glasses of wine per week  . Drug Use: No  . Sexually Active: Yes   Other Topics Concern  . Not on file   Social History  Narrative  . No narrative on file    Past Surgical History  Procedure Date  . Lumbar laminectomy 1997  . Hernia repair 1989    Family History  Problem Relation Age of Onset  . Heart disease Mother   . Heart disease Father     No Known Allergies  Current Outpatient Prescriptions on File Prior to Visit  Medication Sig Dispense Refill  . travoprost, benzalkonium, (TRAVATAN) 0.004 % ophthalmic solution 1 drop at bedtime.         Current Facility-Administered Medications on File Prior to Visit  Medication Dose Route Frequency Provider Last Rate Last Dose  . cyanocobalamin ((VITAMIN B-12)) injection 1,000 mcg  1,000 mcg Intramuscular Q30 days Carrie Mew, MD   1,000 mcg at 07/05/11 0935    BP 130/80  Pulse 76  Temp 98.2 F (36.8 C)  Resp 16  Ht 6' (1.829 m)  Wt 268 lb (121.564 kg)  BMI 36.35 kg/m2    Objective:   Physical Exam  Nursing note reviewed. Constitutional: He is oriented to person, place, and time. He appears well-developed and well-nourished.  HENT:  Head: Normocephalic and atraumatic.  Eyes: Conjunctivae and EOM are normal.  Neck: Normal range of motion. Neck supple.  Pulmonary/Chest: Effort normal and breath sounds normal.  Abdominal: Soft. Bowel sounds are normal.  Genitourinary: Rectum normal and prostate normal.  Musculoskeletal: Normal range of motion.  Neurological: He is alert and oriented to person, place, and time.  Skin: Skin is warm and dry.   Diabetic foot exam:  Left: Reflexes 3+   Vibratory sensation normal  Proprioception normal  Sharp/dull discrimination normal  Filament test present Right: Reflexes 3+   Vibratory sensation normal  Proprioception normal  Sharp/dull discrimination normal  Filament test present        Assessment & Plan:  Patient's blood pressure stable he brings with him a record of blood pressure shows great stability  We will not change his medications at this time we'll measure a basic metabolic  panel to look at his renal function.  We'll monitor and A1c to look at his diabetic control.  He is up-to-date with eye examination he reports no issues with his feet including neuropathy

## 2011-07-05 NOTE — Patient Instructions (Addendum)
The patient is instructed to continue all medications as prescribed. Schedule followup with check out clerk upon leaving the clinic  

## 2011-07-21 ENCOUNTER — Other Ambulatory Visit: Payer: Self-pay | Admitting: *Deleted

## 2011-07-21 ENCOUNTER — Telehealth: Payer: Self-pay | Admitting: *Deleted

## 2011-07-21 MED ORDER — MELOXICAM 15 MG PO TABS: 15.0000 mg | ORAL_TABLET | Freq: Every day | ORAL | Status: AC

## 2011-07-21 MED ORDER — MELOXICAM 15 MG PO TABS: 15.0000 mg | ORAL_TABLET | Freq: Every day | ORAL | Status: DC

## 2011-07-21 NOTE — Telephone Encounter (Signed)
DONE

## 2011-09-06 ENCOUNTER — Ambulatory Visit (INDEPENDENT_AMBULATORY_CARE_PROVIDER_SITE_OTHER): Payer: Medicare Other | Admitting: Internal Medicine

## 2011-09-06 DIAGNOSIS — E538 Deficiency of other specified B group vitamins: Secondary | ICD-10-CM

## 2011-09-06 MED ORDER — CYANOCOBALAMIN 1000 MCG/ML IJ SOLN
1000.0000 ug | INTRAMUSCULAR | Status: AC
  Administered 2011-09-06: 1000 ug via INTRAMUSCULAR

## 2011-10-10 ENCOUNTER — Ambulatory Visit (INDEPENDENT_AMBULATORY_CARE_PROVIDER_SITE_OTHER): Payer: Medicare Other | Admitting: Internal Medicine

## 2011-10-10 DIAGNOSIS — D519 Vitamin B12 deficiency anemia, unspecified: Secondary | ICD-10-CM

## 2011-10-10 DIAGNOSIS — D518 Other vitamin B12 deficiency anemias: Secondary | ICD-10-CM

## 2011-10-10 MED ORDER — CYANOCOBALAMIN 1000 MCG/ML IJ SOLN
1000.0000 ug | INTRAMUSCULAR | Status: AC
  Administered 2011-10-10: 1000 ug via INTRAMUSCULAR

## 2011-10-22 ENCOUNTER — Other Ambulatory Visit: Payer: Self-pay | Admitting: Internal Medicine

## 2011-11-08 ENCOUNTER — Ambulatory Visit (INDEPENDENT_AMBULATORY_CARE_PROVIDER_SITE_OTHER): Payer: Medicare Other | Admitting: Internal Medicine

## 2011-11-08 ENCOUNTER — Encounter: Payer: Self-pay | Admitting: Internal Medicine

## 2011-11-08 VITALS — BP 126/80 | HR 72 | Temp 98.6°F | Resp 16 | Ht 72.0 in | Wt 260.0 lb

## 2011-11-08 DIAGNOSIS — D519 Vitamin B12 deficiency anemia, unspecified: Secondary | ICD-10-CM

## 2011-11-08 DIAGNOSIS — E1169 Type 2 diabetes mellitus with other specified complication: Secondary | ICD-10-CM

## 2011-11-08 DIAGNOSIS — T887XXA Unspecified adverse effect of drug or medicament, initial encounter: Secondary | ICD-10-CM

## 2011-11-08 DIAGNOSIS — E1165 Type 2 diabetes mellitus with hyperglycemia: Secondary | ICD-10-CM

## 2011-11-08 DIAGNOSIS — IMO0002 Reserved for concepts with insufficient information to code with codable children: Secondary | ICD-10-CM

## 2011-11-08 DIAGNOSIS — M109 Gout, unspecified: Secondary | ICD-10-CM

## 2011-11-08 DIAGNOSIS — D518 Other vitamin B12 deficiency anemias: Secondary | ICD-10-CM

## 2011-11-08 LAB — LIPID PANEL
Cholesterol: 190 mg/dL (ref 0–200)
HDL: 48.7 mg/dL (ref >39.00)
LDL Cholesterol: 109 mg/dL — ABNORMAL HIGH (ref 0–99)
Total CHOL/HDL Ratio: 4
Triglycerides: 161 mg/dL — ABNORMAL HIGH (ref 0.0–149.0)
VLDL: 32.2 mg/dL (ref 0.0–40.0)

## 2011-11-08 LAB — HEPATIC FUNCTION PANEL
ALT: 12 U/L (ref 0–53)
AST: 16 U/L (ref 0–37)
Albumin: 4 g/dL (ref 3.5–5.2)
Alkaline Phosphatase: 78 U/L (ref 39–117)
Bilirubin, Direct: 0 mg/dL (ref 0.0–0.3)
Total Bilirubin: 1.2 mg/dL (ref 0.3–1.2)
Total Protein: 7 g/dL (ref 6.0–8.3)

## 2011-11-08 LAB — BASIC METABOLIC PANEL
BUN: 32 mg/dL — ABNORMAL HIGH (ref 6–23)
CO2: 25 mEq/L (ref 19–32)
Calcium: 9.6 mg/dL (ref 8.4–10.5)
Chloride: 108 mEq/L (ref 96–112)
Creatinine, Ser: 1.8 mg/dL — ABNORMAL HIGH (ref 0.4–1.5)
GFR: 46.31 mL/min — ABNORMAL LOW (ref >60.00)
Glucose, Bld: 83 mg/dL (ref 70–99)
Potassium: 4.8 mEq/L (ref 3.5–5.1)
Sodium: 143 mEq/L (ref 135–145)

## 2011-11-08 LAB — HEMOGLOBIN A1C: Hgb A1c MFr Bld: 6 % (ref 4.6–6.5)

## 2011-11-08 LAB — URIC ACID: Uric Acid, Serum: 5 mg/dL (ref 4.0–7.8)

## 2011-11-08 MED ORDER — CYANOCOBALAMIN 1000 MCG/ML IJ SOLN
1000.0000 ug | INTRAMUSCULAR | Status: AC
  Administered 2011-11-08: 1000 ug via INTRAMUSCULAR

## 2011-11-08 NOTE — Progress Notes (Signed)
Subjective:    Patient ID: Kevin Rogers, male    DOB: 1931/08/19, 78 y.o.   MRN: [id number]  HPI Patient is a 78 year old male who presents for followup of hyperlipidemia hypertension and adult onset diabetes.  He has lost a pound since his last office visit and his blood pressure and vital signs reflect that weight loss.  Today we will monitor a basic metabolic panel for renal function and hemoglobin A1c to look at diabetic control.  He believes that his CBGs have improved with a better diet and weight loss.     Review of Systems  Constitutional: Negative for fever and fatigue.  HENT: Negative for hearing loss, congestion, neck pain and postnasal drip.   Eyes: Negative for discharge, redness and visual disturbance.  Respiratory: Negative for cough, shortness of breath and wheezing.   Cardiovascular: Negative for leg swelling.  Gastrointestinal: Negative for abdominal pain, constipation and abdominal distention.  Genitourinary: Negative for urgency and frequency.  Musculoskeletal: Negative for joint swelling and arthralgias.  Skin: Negative for color change and rash.  Neurological: Negative for weakness and light-headedness.  Hematological: Negative for adenopathy.  Psychiatric/Behavioral: Negative for behavioral problems.   Past Medical History  Diagnosis Date  . Diabetes mellitus   . Hyperlipidemia   . Hypertension     History   Social History  . Marital Status: Married    Spouse Name: N/A    Number of Children: N/A  . Years of Education: N/A   Occupational History  . Not on file.   Social History Main Topics  . Smoking status: Former Smoker    Types: Cigarettes    Quit date: 09/28/1995  . Smokeless tobacco: Never Used  . Alcohol Use: 0.6 oz/week    1 Glasses of wine per week  . Drug Use: No  . Sexually Active: Yes   Other Topics Concern  . Not on file   Social History Narrative  . No narrative on file    Past Surgical History  Procedure Date  .  Lumbar laminectomy 1997  . Hernia repair 1989    Family History  Problem Relation Age of Onset  . Heart disease Mother   . Heart disease Father     No Known Allergies  Current Outpatient Prescriptions on File Prior to Visit  Medication Sig Dispense Refill  . allopurinol (ZYLOPRIM) 300 MG tablet Take 1 tablet (300 mg total) by mouth daily.  90 tablet  3  . glipiZIDE (GLUCOTROL XL) 5 MG 24 hr tablet Take 1 tablet (5 mg total) by mouth daily.  90 tablet  3  . glipiZIDE (GLUCOTROL XL) 5 MG 24 hr tablet TAKE 1 TABLET EVERY DAY  90 tablet  2  . losartan (COZAAR) 100 MG tablet Take 1 tablet (100 mg total) by mouth daily.  90 tablet  3  . meloxicam (MOBIC) 15 MG tablet Take 1 tablet (15 mg total) by mouth daily.  90 tablet  3  . Tamsulosin HCl (FLOMAX) 0.4 MG CAPS Take 1 capsule (0.4 mg total) by mouth daily.  90 capsule  3  . travoprost, benzalkonium, (TRAVATAN) 0.004 % ophthalmic solution 1 drop at bedtime.         Current Facility-Administered Medications on File Prior to Visit  Medication Dose Route Frequency Provider Last Rate Last Dose  . DISCONTD: cyanocobalamin ((VITAMIN B-12)) injection 1,000 mcg  1,000 mcg Intramuscular Q30 days Stacie Glaze, MD   1,000 mcg at 07/05/11 0935  . DISCONTD: cyanocobalamin ((VITAMIN B-12)) injection  1,000 mcg  1,000 mcg Intramuscular Q30 days Stacie Glaze, MD        BP 126/80  Pulse 72  Temp 98.6 F (37 C)  Resp 16  Ht 6' (1.829 m)  Wt 260 lb (117.935 kg)  BMI 35.26 kg/m2       Objective:   Physical Exam  Nursing note and vitals reviewed. Constitutional: He appears well-developed and well-nourished.  HENT:  Head: Normocephalic and atraumatic.  Eyes: Conjunctivae are normal. Pupils are equal, round, and reactive to light.  Neck: Normal range of motion. Neck supple.  Cardiovascular: Normal rate and regular rhythm.   Pulmonary/Chest: Effort normal and breath sounds normal.  Abdominal: Soft. Bowel sounds are normal.            Assessment & Plan:

## 2011-11-08 NOTE — Patient Instructions (Signed)
The patient is instructed to continue all medications as prescribed. Schedule followup with check out clerk upon leaving the clinic  

## 2011-12-16 ENCOUNTER — Telehealth: Payer: Self-pay | Admitting: Oncology

## 2011-12-16 NOTE — Telephone Encounter (Signed)
lmonvm for pt re appt for 10/18 and mailed schedule

## 2012-01-11 ENCOUNTER — Ambulatory Visit (INDEPENDENT_AMBULATORY_CARE_PROVIDER_SITE_OTHER): Payer: Medicare Other | Admitting: Internal Medicine

## 2012-01-11 DIAGNOSIS — D519 Vitamin B12 deficiency anemia, unspecified: Secondary | ICD-10-CM

## 2012-01-11 DIAGNOSIS — Z23 Encounter for immunization: Secondary | ICD-10-CM

## 2012-01-11 DIAGNOSIS — D518 Other vitamin B12 deficiency anemias: Secondary | ICD-10-CM

## 2012-01-11 MED ORDER — CYANOCOBALAMIN 1000 MCG/ML IJ SOLN
1000.0000 ug | INTRAMUSCULAR | Status: DC
  Administered 2012-01-11: 1000 ug via INTRAMUSCULAR

## 2012-02-17 ENCOUNTER — Ambulatory Visit: Payer: Medicare Other | Admitting: Oncology

## 2012-02-17 ENCOUNTER — Other Ambulatory Visit (HOSPITAL_BASED_OUTPATIENT_CLINIC_OR_DEPARTMENT_OTHER): Payer: Medicare Other | Admitting: Lab

## 2012-02-17 ENCOUNTER — Ambulatory Visit (HOSPITAL_BASED_OUTPATIENT_CLINIC_OR_DEPARTMENT_OTHER): Payer: Medicare Other | Admitting: Family

## 2012-02-17 ENCOUNTER — Encounter: Payer: Self-pay | Admitting: Family

## 2012-02-17 ENCOUNTER — Telehealth: Payer: Self-pay | Admitting: Oncology

## 2012-02-17 VITALS — BP 112/70 | HR 69 | Temp 97.0°F | Resp 20 | Ht 72.0 in | Wt 258.9 lb

## 2012-02-17 DIAGNOSIS — C9111 Chronic lymphocytic leukemia of B-cell type in remission: Secondary | ICD-10-CM

## 2012-02-17 DIAGNOSIS — C9110 Chronic lymphocytic leukemia of B-cell type not having achieved remission: Secondary | ICD-10-CM

## 2012-02-17 LAB — CBC WITH DIFFERENTIAL/PLATELET
BASO%: 0.1 % (ref 0.0–2.0)
Basophils Absolute: 0 10*3/uL (ref 0.0–0.1)
EOS%: 0.7 % (ref 0.0–7.0)
Eosinophils Absolute: 0.1 10*3/uL (ref 0.0–0.5)
HCT: 40.4 % (ref 38.4–49.9)
HGB: 13.2 g/dL (ref 13.0–17.1)
LYMPH%: 73.3 % — ABNORMAL HIGH (ref 14.0–49.0)
MCH: 29.6 pg (ref 27.2–33.4)
MCHC: 32.6 g/dL (ref 32.0–36.0)
MCV: 90.9 fL (ref 79.3–98.0)
MONO#: 0.3 10*3/uL (ref 0.1–0.9)
MONO%: 2 % (ref 0.0–14.0)
NEUT#: 4 10*3/uL (ref 1.5–6.5)
NEUT%: 23.9 % — ABNORMAL LOW (ref 39.0–75.0)
Platelets: 168 10*3/uL (ref 140–400)
RBC: 4.44 10*6/uL (ref 4.20–5.82)
RDW: 14.5 % (ref 11.0–14.6)
WBC: 16.8 10*3/uL — ABNORMAL HIGH (ref 4.0–10.3)
lymph#: 12.3 10*3/uL — ABNORMAL HIGH (ref 0.9–3.3)

## 2012-02-17 LAB — COMPREHENSIVE METABOLIC PANEL (CC13)
ALT: 10 U/L (ref 0–55)
AST: 11 U/L (ref 5–34)
Albumin: 4 g/dL (ref 3.5–5.0)
Alkaline Phosphatase: 91 U/L (ref 40–150)
BUN: 19 mg/dL (ref 7.0–26.0)
CO2: 24 mEq/L (ref 22–29)
Calcium: 10 mg/dL (ref 8.4–10.4)
Chloride: 109 mEq/L — ABNORMAL HIGH (ref 98–107)
Creatinine: 1.5 mg/dL — ABNORMAL HIGH (ref 0.7–1.3)
Glucose: 108 mg/dl — ABNORMAL HIGH (ref 70–99)
Potassium: 4.5 mEq/L (ref 3.5–5.1)
Sodium: 142 mEq/L (ref 136–145)
Total Bilirubin: 1.3 mg/dL — ABNORMAL HIGH (ref 0.20–1.20)
Total Protein: 6.7 g/dL (ref 6.4–8.3)

## 2012-02-17 LAB — LACTATE DEHYDROGENASE (CC13): LDH: 112 U/L — ABNORMAL LOW (ref 125–220)

## 2012-02-17 NOTE — Telephone Encounter (Signed)
appt made and printed for pt aom °

## 2012-02-17 NOTE — Patient Instructions (Signed)
Keep up the great work! Please call us at anytime if you have any questions or concerns.

## 2012-02-17 NOTE — Progress Notes (Signed)
Patient ID: Kevin Rogers, male   DOB: 1932/01/22, 78 y.o.   MRN: [id number] CSN: [id number]  CC: Kevin Glaze, MD  Problem List: Kevin Rogers is a 78 y.o. African-American male with a problem list consisting of:  1.Chronic Lymphocytic Leukemia (CLL) stage 0, CD20 positive (Diagnosed in August 2000) 2. Elevated uric acid level 3. Diabetes mellitus type II 4. Cyst on left thyroid lobe 5. DJD - lumbar spine 6. Glaucoma left eye 7. HTN  Kevin Rogers is a 78 year old African American male who returns to the clinic today for follow up of his stage 0, CD20 positive chronic lymphocytic leukemia. The patient was seen by Dr. Arline Rogers and I today. Since his last clinic visit on 01/14/2011, he states that he has been doing very well and reports a normal energy level and good appetite. He states that he has ongoing back pain but this is intermittent in nature and chronic condition. He denies any other symptomatology today including unusual bleeding/bruising, fever, chills, night sweats, dyspnea/cough nausea, vomiting, constipation, diarrhea, abdominal pain, dysuria, urinary frequency or hematuria.  He has not been hospitalized or had any illnesses since his last office visit.  Past Medical History: Past Medical History  Diagnosis Date  . Diabetes mellitus   . Hyperlipidemia   . Hypertension     Surgical History: Past Surgical History  Procedure Date  . Lumbar laminectomy 1997  . Hernia repair 1989    Current Medications: Current Outpatient Prescriptions  Medication Sig Dispense Refill  . allopurinol (ZYLOPRIM) 300 MG tablet Take 1 tablet (300 mg total) by mouth daily.  90 tablet  3  . glipiZIDE (GLUCOTROL XL) 5 MG 24 hr tablet Take 1 tablet (5 mg total) by mouth daily.  90 tablet  3  . losartan (COZAAR) 100 MG tablet Take 1 tablet (100 mg total) by mouth daily.  90 tablet  3  . meloxicam (MOBIC) 15 MG tablet Take 1 tablet (15 mg total) by mouth daily.  90 tablet  3  . Tamsulosin HCl (FLOMAX)  0.4 MG CAPS Take 1 capsule (0.4 mg total) by mouth daily.  90 capsule  3  . travoprost, benzalkonium, (TRAVATAN) 0.004 % ophthalmic solution 1 drop at bedtime.          Allergies: No Known Allergies  Family History: Family History  Problem Relation Age of Onset  . Heart disease Mother   . Heart disease Father     Social History: History  Substance Use Topics  . Smoking status: Former Smoker    Types: Cigarettes    Quit date: 09/28/1995  . Smokeless tobacco: Never Used  . Alcohol Use: 0.6 oz/week    1 Glasses of wine per week    Review of Systems: 10 Point review of systems was completed and is negative except as noted above.   Physical Exam:   Blood pressure 112/70, pulse 69, temperature 97 F (36.1 C), temperature source Oral, resp. rate 20, height 6' (1.829 m), weight 258 lb 14.4 oz (117.436 kg).  General appearance: Alert, cooperative, well nourished, no apparent distress Head: Normocephalic, without obvious abnormality, atraumatic Eyes: Conjunctivae clear, cloudy corneas, arcus senilis,  PERRLA, EOMI Nose: Nares, septum and mucosa are normal, no drainage or sinus tenderness Neck: No adenopathy, supple, symmetrical, trachea midline, thyroid not enlarged, no tenderness Resp: Clear to auscultation bilaterally Cardio: Regular rate and rhythm, S1, S2 normal, no murmur, click, rub or gallop GI: Soft, distended, non-tender, hypoactive bowel sounds, no organomegaly Extremities: Extremities normal, atraumatic, no cyanosis  or edema Lymph nodes: Cervical, supraclavicular, and axillary nodes normal Neurologic: Grossly normal   Laboratory Data: Results for orders placed in visit on 02/17/12 (from the past 48 hour(s))  CBC WITH DIFFERENTIAL     Status: Abnormal   Collection Time   02/17/12  1:46 PM      Component Value Range Comment   WBC 16.8 (*) 4.0 - 10.3 10e3/uL    NEUT# 4.0  1.5 - 6.5 10e3/uL    HGB 13.2  13.0 - 17.1 g/dL    HCT 47.8  29.5 - 62.1 %    Platelets 168   140 - 400 10e3/uL    MCV 90.9  79.3 - 98.0 fL    MCH 29.6  27.2 - 33.4 pg    MCHC 32.6  32.0 - 36.0 g/dL    RBC 3.08  6.57 - 8.46 10e6/uL    RDW 14.5  11.0 - 14.6 %    lymph# 12.3 (*) 0.9 - 3.3 10e3/uL    MONO# 0.3  0.1 - 0.9 10e3/uL    Eosinophils Absolute 0.1  0.0 - 0.5 10e3/uL    Basophils Absolute 0.0  0.0 - 0.1 10e3/uL    NEUT% 23.9 (*) 39.0 - 75.0 %    LYMPH% 73.3 (*) 14.0 - 49.0 %    MONO% 2.0  0.0 - 14.0 %    EOS% 0.7  0.0 - 7.0 %    BASO% 0.1  0.0 - 2.0 %   COMPREHENSIVE METABOLIC PANEL (CC13)     Status: Abnormal   Collection Time   02/17/12  1:46 PM      Component Value Range Comment   Sodium 142  136 - 145 mEq/L    Potassium 4.5  3.5 - 5.1 mEq/L    Chloride 109 (*) 98 - 107 mEq/L    CO2 24  22 - 29 mEq/L    Glucose 108 (*) 70 - 99 mg/dl    BUN 96.2  7.0 - 95.2 mg/dL    Creatinine 1.5 (*) 0.7 - 1.3 mg/dL    Total Bilirubin 8.41 (*) 0.20 - 1.20 mg/dL    Alkaline Phosphatase 91  40 - 150 U/L    AST 11  5 - 34 U/L    ALT 10  0 - 55 U/L    Total Protein 6.7  6.4 - 8.3 g/dL    Albumin 4.0  3.5 - 5.0 g/dL    Calcium 32.4  8.4 - 10.4 mg/dL   LACTATE DEHYDROGENASE (CC13)     Status: Abnormal   Collection Time   02/17/12  1:46 PM      Component Value Range Comment   LDH 112 (*) 125 - 220 U/L     Imaging Studies: 1. Nuclear cardiac stress test on 04/20/2011 showed a normal stress nuclear study. LVEF 65% 2. MRI of the lumbar spine w/wo contrast on 08/06/2008 showed 1. Severe facet degenerative change results in anterolisthesis of  L3 on L4 and bilateral foraminal narrowing, worse than right. 2. Status post laminectomy L3-4, L4-5 with good decompression of  the thecal sac. 3. Multilevel facet degenerative change appears worst at L3-4 and L2-3.  3. Diagnostic CXR on 12/26/2006 showed the heart size and mediastinal contours are within normal limits. Both lungs are clear. The visualized skeletal structures are within normal limits.    Impression/Plan: Kevin Rogers is a  78 year old African American male with indolent stage 0, CD20 positive chronic lymphocytic leukemia, diagnosed in August 2000. Overall he has stable  blood counts and no sense of ill health. The patient will be scheduled for follow up appointment with Dr. Arline Rogers in 12 months' time, at which time we will check CBC and chemistries. The patient is asked to contact us in the interim if he has any questions or concerns.   Larina Bras, NP-C 02/17/2012, 2:43 PM

## 2012-03-13 LAB — HM DIABETES EYE EXAM

## 2012-04-03 ENCOUNTER — Ambulatory Visit (INDEPENDENT_AMBULATORY_CARE_PROVIDER_SITE_OTHER): Payer: Medicare Other | Admitting: Internal Medicine

## 2012-04-03 DIAGNOSIS — D519 Vitamin B12 deficiency anemia, unspecified: Secondary | ICD-10-CM

## 2012-04-03 DIAGNOSIS — D518 Other vitamin B12 deficiency anemias: Secondary | ICD-10-CM

## 2012-04-03 MED ORDER — CYANOCOBALAMIN 1000 MCG/ML IJ SOLN
1000.0000 ug | INTRAMUSCULAR | Status: AC
  Administered 2012-04-03: 1000 ug via INTRAMUSCULAR

## 2012-05-04 ENCOUNTER — Other Ambulatory Visit: Payer: Medicare Other

## 2012-05-09 ENCOUNTER — Ambulatory Visit (INDEPENDENT_AMBULATORY_CARE_PROVIDER_SITE_OTHER): Payer: Medicare Other | Admitting: Internal Medicine

## 2012-05-09 DIAGNOSIS — D518 Other vitamin B12 deficiency anemias: Secondary | ICD-10-CM

## 2012-05-09 DIAGNOSIS — D519 Vitamin B12 deficiency anemia, unspecified: Secondary | ICD-10-CM

## 2012-05-09 MED ORDER — CYANOCOBALAMIN 1000 MCG/ML IJ SOLN
1000.0000 ug | INTRAMUSCULAR | Status: AC
  Administered 2012-05-09: 1000 ug via INTRAMUSCULAR

## 2012-05-10 ENCOUNTER — Encounter: Payer: Medicare Other | Admitting: Internal Medicine

## 2012-07-13 ENCOUNTER — Other Ambulatory Visit: Payer: Self-pay | Admitting: *Deleted

## 2012-07-13 MED ORDER — TAMSULOSIN HCL 0.4 MG PO CAPS: 0.4000 mg | ORAL_CAPSULE | Freq: Every day | ORAL | Status: DC

## 2012-07-21 ENCOUNTER — Other Ambulatory Visit: Payer: Self-pay | Admitting: Internal Medicine

## 2012-07-23 ENCOUNTER — Other Ambulatory Visit (INDEPENDENT_AMBULATORY_CARE_PROVIDER_SITE_OTHER): Payer: Medicare Other

## 2012-07-23 DIAGNOSIS — Z0000 Encounter for general adult medical examination without abnormal findings: Secondary | ICD-10-CM

## 2012-07-23 DIAGNOSIS — E119 Type 2 diabetes mellitus without complications: Secondary | ICD-10-CM

## 2012-07-23 DIAGNOSIS — E785 Hyperlipidemia, unspecified: Secondary | ICD-10-CM

## 2012-07-23 DIAGNOSIS — C9110 Chronic lymphocytic leukemia of B-cell type not having achieved remission: Secondary | ICD-10-CM

## 2012-07-23 LAB — LIPID PANEL
Cholesterol: 188 mg/dL (ref 0–200)
HDL: 45.1 mg/dL (ref >39.00)
LDL Cholesterol: 123 mg/dL — ABNORMAL HIGH (ref 0–99)
Total CHOL/HDL Ratio: 4
Triglycerides: 100 mg/dL (ref 0.0–149.0)
VLDL: 20 mg/dL (ref 0.0–40.0)

## 2012-07-23 LAB — POCT URINALYSIS DIPSTICK
Bilirubin, UA: NEGATIVE
Blood, UA: NEGATIVE
Glucose, UA: NEGATIVE
Ketones, UA: NEGATIVE
Leukocytes, UA: NEGATIVE
Nitrite, UA: NEGATIVE
Protein, UA: NEGATIVE
Spec Grav, UA: 1.015
Urobilinogen, UA: 0.2
pH, UA: 5.5

## 2012-07-23 LAB — CBC WITH DIFFERENTIAL/PLATELET
Basophils Absolute: 0.1 10*3/uL (ref 0.0–0.1)
Basophils Relative: 0.4 % (ref 0.0–3.0)
Eosinophils Absolute: 0.2 10*3/uL (ref 0.0–0.7)
Eosinophils Relative: 1.3 % (ref 0.0–5.0)
HCT: 38.5 % — ABNORMAL LOW (ref 39.0–52.0)
Hemoglobin: 12.5 g/dL — ABNORMAL LOW (ref 13.0–17.0)
Lymphocytes Relative: 70.8 % — ABNORMAL HIGH (ref 12.0–46.0)
Lymphs Abs: 11.4 10*3/uL — ABNORMAL HIGH (ref 0.7–4.0)
MCHC: 32.3 g/dL (ref 30.0–36.0)
MCV: 92.4 fl (ref 78.0–100.0)
Monocytes Absolute: 0.2 10*3/uL (ref 0.1–1.0)
Monocytes Relative: 1.3 % — ABNORMAL LOW (ref 3.0–12.0)
Neutro Abs: 4.2 10*3/uL (ref 1.4–7.7)
Neutrophils Relative %: 26.2 % — ABNORMAL LOW (ref 43.0–77.0)
Platelets: 168 10*3/uL (ref 150.0–400.0)
RBC: 4.17 Mil/uL — ABNORMAL LOW (ref 4.22–5.81)
RDW: 14.5 % (ref 11.5–14.6)
WBC: 16.1 10*3/uL — ABNORMAL HIGH (ref 4.5–10.5)

## 2012-07-23 LAB — BASIC METABOLIC PANEL
BUN: 17 mg/dL (ref 6–23)
CO2: 27 mEq/L (ref 19–32)
Calcium: 9.2 mg/dL (ref 8.4–10.5)
Chloride: 108 mEq/L (ref 96–112)
Creatinine, Ser: 1.4 mg/dL (ref 0.4–1.5)
GFR: 62.45 mL/min (ref >60.00)
Glucose, Bld: 120 mg/dL — ABNORMAL HIGH (ref 70–99)
Potassium: 4.8 mEq/L (ref 3.5–5.1)
Sodium: 141 mEq/L (ref 135–145)

## 2012-07-23 LAB — HEPATIC FUNCTION PANEL
ALT: 25 U/L (ref 0–53)
AST: 45 U/L — ABNORMAL HIGH (ref 0–37)
Albumin: 3.8 g/dL (ref 3.5–5.2)
Alkaline Phosphatase: 90 U/L (ref 39–117)
Bilirubin, Direct: 0.1 mg/dL (ref 0.0–0.3)
Total Bilirubin: 0.9 mg/dL (ref 0.3–1.2)
Total Protein: 6.3 g/dL (ref 6.0–8.3)

## 2012-07-23 LAB — MICROALBUMIN / CREATININE URINE RATIO
Creatinine,U: 96 mg/dL
Microalb Creat Ratio: 1.3 mg/g (ref 0.0–30.0)
Microalb, Ur: 1.2 mg/dL (ref 0.0–1.9)

## 2012-07-23 LAB — TSH: TSH: 2.35 u[IU]/mL (ref 0.35–5.50)

## 2012-07-23 LAB — HEMOGLOBIN A1C: Hgb A1c MFr Bld: 6.2 % (ref 4.6–6.5)

## 2012-07-23 LAB — PSA: PSA: 0.8 ng/mL (ref 0.10–4.00)

## 2012-07-23 NOTE — Addendum Note (Signed)
Addended by: Bonnye Fava on: 07/23/2012 08:20 AM   Modules accepted: Orders

## 2012-07-24 ENCOUNTER — Other Ambulatory Visit: Payer: Self-pay | Admitting: *Deleted

## 2012-07-24 MED ORDER — MELOXICAM 15 MG PO TABS: 15.0000 mg | ORAL_TABLET | Freq: Every day | ORAL | Status: DC

## 2012-07-30 ENCOUNTER — Encounter: Payer: Self-pay | Admitting: Internal Medicine

## 2012-07-30 ENCOUNTER — Ambulatory Visit (INDEPENDENT_AMBULATORY_CARE_PROVIDER_SITE_OTHER): Payer: Medicare Other | Admitting: Internal Medicine

## 2012-07-30 VITALS — BP 112/60 | HR 70 | Temp 98.2°F | Resp 18 | Ht 72.0 in | Wt 264.0 lb

## 2012-07-30 DIAGNOSIS — N508 Other specified disorders of male genital organs: Secondary | ICD-10-CM

## 2012-07-30 DIAGNOSIS — Z0000 Encounter for general adult medical examination without abnormal findings: Secondary | ICD-10-CM

## 2012-07-30 DIAGNOSIS — E291 Testicular hypofunction: Secondary | ICD-10-CM

## 2012-07-30 DIAGNOSIS — N5089 Other specified disorders of the male genital organs: Secondary | ICD-10-CM

## 2012-07-30 DIAGNOSIS — I10 Essential (primary) hypertension: Secondary | ICD-10-CM

## 2012-07-30 NOTE — Patient Instructions (Signed)
The patient is instructed to continue all medications as prescribed. Schedule followup with check out clerk upon leaving the clinic  

## 2012-07-30 NOTE — Progress Notes (Signed)
Subjective:    Patient ID: Kevin Rogers, male    DOB: 01-05-1932, 79 y.o.   MRN: [id number]  HPICPX  Back pain and testicular mass  Review of Systems  Constitutional: Negative for fever and fatigue.  HENT: Negative for hearing loss, congestion, neck pain and postnasal drip.   Eyes: Negative for discharge, redness and visual disturbance.  Respiratory: Negative for cough, shortness of breath and wheezing.   Cardiovascular: Negative for leg swelling.  Gastrointestinal: Negative for abdominal pain, constipation and abdominal distention.  Genitourinary: Negative for urgency and frequency.  Musculoskeletal: Negative for joint swelling and arthralgias.  Skin: Negative for color change and rash.  Neurological: Negative for weakness and light-headedness.  Hematological: Negative for adenopathy.  Psychiatric/Behavioral: Negative for behavioral problems.   Past Medical History  Diagnosis Date  . Diabetes mellitus   . Hyperlipidemia   . Hypertension     History   Social History  . Marital Status: Married    Spouse Name: N/A    Number of Children: N/A  . Years of Education: N/A   Occupational History  . Not on file.   Social History Main Topics  . Smoking status: Former Smoker    Types: Cigarettes    Quit date: 09/28/1995  . Smokeless tobacco: Never Used  . Alcohol Use: 0.6 oz/week    1 Glasses of wine per week  . Drug Use: No  . Sexually Active: Yes   Other Topics Concern  . Not on file   Social History Narrative  . No narrative on file    Past Surgical History  Procedure Laterality Date  . Lumbar laminectomy  1997  . Hernia repair  1989    Family History  Problem Relation Age of Onset  . Heart disease Mother   . Heart disease Father     No Known Allergies  Current Outpatient Prescriptions on File Prior to Visit  Medication Sig Dispense Refill  . glipiZIDE (GLUCOTROL XL) 5 MG 24 hr tablet Take 1 tablet (5 mg total) by mouth daily.  90 tablet  3  .  meloxicam (MOBIC) 15 MG tablet Take 1 tablet (15 mg total) by mouth daily.  90 tablet  3  . tamsulosin (FLOMAX) 0.4 MG CAPS Take 1 capsule (0.4 mg total) by mouth daily.  90 capsule  3  . travoprost, benzalkonium, (TRAVATAN) 0.004 % ophthalmic solution 1 drop at bedtime.        [name] allopurinol (ZYLOPRIM) 300 MG tablet Take 1 tablet (300 mg total) by mouth daily.  90 tablet  3  . losartan (COZAAR) 100 MG tablet Take 1 tablet (100 mg total) by mouth daily.  90 tablet  3   No current facility-administered medications on file prior to visit.    BP 112/60  Pulse 70  Temp(Src) 98.2 F (36.8 C)  Resp 18  Ht 6' (1.829 m)  Wt 264 lb (119.75 kg)  BMI 35.8 kg/m2        Objective:   Physical Exam  Nursing note and vitals reviewed. Constitutional: He appears well-developed and well-nourished.  HENT:  Head: Normocephalic and atraumatic.  Eyes: Conjunctivae are normal. Pupils are equal, round, and reactive to light.  Neck: Normal range of motion. Neck supple.  Cardiovascular: Normal rate and regular rhythm.   Pulmonary/Chest: Effort normal and breath sounds normal.  Abdominal: Soft. Bowel sounds are normal.  Genitourinary: Guaiac negative stool. Penile tenderness present.  Testicular mass small firm mass on left testicle about .3 cm  Musculoskeletal: He  exhibits edema and tenderness.  Neurological: He is alert.  Skin: Skin is warm and dry.  Psychiatric: He has a normal mood and affect. His behavior is normal.          Assessment & Plan:   Patient presents for yearly preventative medicine examination.   all immunizations and health maintenance protocols were reviewed with the patient and they are up to date with these protocols.   screening laboratory values were reviewed with the patient including screening of hyperlipidemia PSA renal function and hepatic function.   There medications past medical history social history problem list and allergies were reviewed in detail.   Goals  were established with regard to weight loss exercise diet in compliance with medications  Follow up for testicular lesion. [place] and  AFP

## 2012-07-31 LAB — HCG, SERUM, QUALITATIVE: Preg, Serum: NEGATIVE

## 2012-07-31 LAB — AFP TUMOR MARKER: AFP-Tumor Marker: <1.3 ng/mL (ref 0.0–8.0)

## 2012-08-01 ENCOUNTER — Other Ambulatory Visit: Payer: Self-pay | Admitting: Internal Medicine

## 2012-08-01 ENCOUNTER — Ambulatory Visit
Admission: RE | Admit: 2012-08-01 | Discharge: 2012-08-01 | Disposition: A | Discharge status: Home / Self Care | Payer: Medicare Other | Source: Ambulatory Visit | Attending: Internal Medicine | Admitting: Internal Medicine

## 2012-08-01 DIAGNOSIS — N5089 Other specified disorders of the male genital organs: Secondary | ICD-10-CM

## 2012-09-04 ENCOUNTER — Other Ambulatory Visit: Payer: Self-pay | Admitting: *Deleted

## 2012-09-04 ENCOUNTER — Ambulatory Visit (INDEPENDENT_AMBULATORY_CARE_PROVIDER_SITE_OTHER): Payer: Medicare Other | Admitting: *Deleted

## 2012-09-04 ENCOUNTER — Telehealth: Payer: Self-pay | Admitting: Internal Medicine

## 2012-09-04 DIAGNOSIS — D518 Other vitamin B12 deficiency anemias: Secondary | ICD-10-CM

## 2012-09-04 DIAGNOSIS — M545 Low back pain, unspecified: Secondary | ICD-10-CM

## 2012-09-04 DIAGNOSIS — D519 Vitamin B12 deficiency anemia, unspecified: Secondary | ICD-10-CM

## 2012-09-04 MED ORDER — CYANOCOBALAMIN 1000 MCG/ML IJ SOLN
1000.0000 ug | INTRAMUSCULAR | Status: AC
  Administered 2012-09-04: 1000 ug via INTRAMUSCULAR

## 2012-09-04 NOTE — Telephone Encounter (Signed)
Pt was here for cpx 07/30/12 and having back pain at that time. Not getting better & wants to know further options - PT, referral, etc. Please advise.

## 2012-11-30 ENCOUNTER — Ambulatory Visit: Payer: Medicare Other | Admitting: Internal Medicine

## 2012-11-30 ENCOUNTER — Encounter: Payer: Self-pay | Admitting: Internal Medicine

## 2012-11-30 ENCOUNTER — Ambulatory Visit (INDEPENDENT_AMBULATORY_CARE_PROVIDER_SITE_OTHER): Payer: Medicare Other | Admitting: Internal Medicine

## 2012-11-30 VITALS — BP 140/80 | HR 76 | Temp 98.3°F | Resp 16 | Ht 72.0 in | Wt 258.0 lb

## 2012-11-30 DIAGNOSIS — E119 Type 2 diabetes mellitus without complications: Secondary | ICD-10-CM

## 2012-11-30 DIAGNOSIS — I10 Essential (primary) hypertension: Secondary | ICD-10-CM

## 2012-11-30 DIAGNOSIS — M109 Gout, unspecified: Secondary | ICD-10-CM

## 2012-11-30 DIAGNOSIS — IMO0002 Reserved for concepts with insufficient information to code with codable children: Secondary | ICD-10-CM

## 2012-11-30 DIAGNOSIS — C9110 Chronic lymphocytic leukemia of B-cell type not having achieved remission: Secondary | ICD-10-CM

## 2012-11-30 DIAGNOSIS — E1142 Type 2 diabetes mellitus with diabetic polyneuropathy: Secondary | ICD-10-CM

## 2012-11-30 DIAGNOSIS — M549 Dorsalgia, unspecified: Secondary | ICD-10-CM

## 2012-11-30 LAB — CBC WITH DIFFERENTIAL/PLATELET
Basophils Absolute: 0.1 10*3/uL (ref 0.0–0.1)
Basophils Relative: 0.3 % (ref 0.0–3.0)
Eosinophils Absolute: 0.1 10*3/uL (ref 0.0–0.7)
Eosinophils Relative: 0.5 % (ref 0.0–5.0)
HCT: 37.5 % — ABNORMAL LOW (ref 39.0–52.0)
Hemoglobin: 11.9 g/dL — ABNORMAL LOW (ref 13.0–17.0)
Lymphocytes Relative: 69.4 % — ABNORMAL HIGH (ref 12.0–46.0)
Lymphs Abs: 11.4 10*3/uL — ABNORMAL HIGH (ref 0.7–4.0)
MCHC: 31.8 g/dL (ref 30.0–36.0)
MCV: 95.7 fl (ref 78.0–100.0)
Monocytes Absolute: 0.3 10*3/uL (ref 0.1–1.0)
Monocytes Relative: 2.1 % — ABNORMAL LOW (ref 3.0–12.0)
Neutro Abs: 4.5 10*3/uL (ref 1.4–7.7)
Neutrophils Relative %: 27.7 % — ABNORMAL LOW (ref 43.0–77.0)
Platelets: 163 10*3/uL (ref 150.0–400.0)
RBC: 3.92 Mil/uL — ABNORMAL LOW (ref 4.22–5.81)
RDW: 16 % — ABNORMAL HIGH (ref 11.5–14.6)
WBC: 16.4 10*3/uL — ABNORMAL HIGH (ref 4.5–10.5)

## 2012-11-30 LAB — BASIC METABOLIC PANEL
BUN: 18 mg/dL (ref 6–23)
CO2: 27 mEq/L (ref 19–32)
Calcium: 9.5 mg/dL (ref 8.4–10.5)
Chloride: 109 mEq/L (ref 96–112)
Creatinine, Ser: 1.5 mg/dL (ref 0.4–1.5)
GFR: 59 mL/min — ABNORMAL LOW (ref >60.00)
Glucose, Bld: 132 mg/dL — ABNORMAL HIGH (ref 70–99)
Potassium: 4.4 mEq/L (ref 3.5–5.1)
Sodium: 143 mEq/L (ref 135–145)

## 2012-11-30 LAB — HEMOGLOBIN A1C: Hgb A1c MFr Bld: 6.3 % (ref 4.6–6.5)

## 2012-11-30 NOTE — Patient Instructions (Signed)
The patient is instructed to continue all medications as prescribed. Schedule followup with check out clerk upon leaving the clinic  

## 2012-11-30 NOTE — Progress Notes (Signed)
Subjective:    Patient ID: Kevin Rogers, male    DOB: 1931-09-15, 79 y.o.   MRN: [id number]  HPI CLL Chronic back pain and epidural therapy that did not last Has been referred for nerve block Has symptoms of falls and spinal stenosis Patient is failed epidural injections they have been some discussion about ganglion block but he is hesitant to do this given his prior history of back surgery and also the sensation that he feels that something is "slipping" in his back.  He is interested in and then a neurosurgical evaluation to make sure that there is not a surgical intervention indicated for his chronic back pain.     Review of Systems  Constitutional: Negative for fever and fatigue.  HENT: Negative for hearing loss, congestion, neck pain and postnasal drip.   Eyes: Negative for discharge, redness and visual disturbance.  Respiratory: Negative for cough, shortness of breath and wheezing.   Cardiovascular: Negative for leg swelling.  Gastrointestinal: Negative for abdominal pain, constipation and abdominal distention.  Genitourinary: Negative for urgency and frequency.  Musculoskeletal: Positive for myalgias, back pain, joint swelling and gait problem. Negative for arthralgias.  Skin: Negative for color change and rash.  Neurological: Negative for weakness and light-headedness.  Hematological: Negative for adenopathy.  Psychiatric/Behavioral: Negative for behavioral problems.       Past Medical History  Diagnosis Date  . Diabetes mellitus   . Hyperlipidemia   . Hypertension     History   Social History  . Marital Status: Married    Spouse Name: N/A    Number of Children: N/A  . Years of Education: N/A   Occupational History  . Not on file.   Social History Main Topics  . Smoking status: Former Smoker    Types: Cigarettes    Quit date: 09/28/1995  . Smokeless tobacco: Never Used  . Alcohol Use: 0.6 oz/week    1 Glasses of wine per week  . Drug Use: No  .  Sexually Active: Yes   Other Topics Concern  . Not on file   Social History Narrative  . No narrative on file    Past Surgical History  Procedure Laterality Date  . Lumbar laminectomy  1997  . Hernia repair  1989    Family History  Problem Relation Age of Onset  . Heart disease Mother   . Heart disease Father     No Known Allergies  Current Outpatient Prescriptions on File Prior to Visit  Medication Sig Dispense Refill  . glipiZIDE (GLUCOTROL XL) 5 MG 24 hr tablet Take 1 tablet (5 mg total) by mouth daily.  90 tablet  3  . meloxicam (MOBIC) 15 MG tablet Take 1 tablet (15 mg total) by mouth daily.  90 tablet  3  . tamsulosin (FLOMAX) 0.4 MG CAPS Take 1 capsule (0.4 mg total) by mouth daily.  90 capsule  3  . travoprost, benzalkonium, (TRAVATAN) 0.004 % ophthalmic solution 1 drop at bedtime.         No current facility-administered medications on file prior to visit.    BP 140/80  Pulse 76  Temp(Src) 98.3 F (36.8 C)  Resp 16  Ht 6' (1.829 m)  Wt 258 lb (117.028 kg)  BMI 34.98 kg/m2    Objective:   Physical Exam  Nursing note and vitals reviewed. Constitutional: He appears well-developed and well-nourished.  HENT:  Head: Normocephalic and atraumatic.  Eyes: Conjunctivae are normal. Pupils are equal, round, and reactive to light.  Neck: Normal range of motion. Neck supple.  Cardiovascular: Normal rate and regular rhythm.   Pulmonary/Chest: Effort normal and breath sounds normal.  Abdominal: Soft. Bowel sounds are normal.  Musculoskeletal: He exhibits tenderness.  Chronic radicular back pain          Assessment & Plan:  Referral to neurosurgery made to evaluate the nature of his back pain differential diagnosis includes spondylosis ruptured disc and possibly spinal stenosis.  Previous laminectomy was at L4-5  Last MRI was 2010 but we will defer to the neurosurgeon to order an MRI or potentially order a discogram and myelogram Appropriate monitoring of  her diabetes including an A1c appropriate monitoring for CLL including a CBC with differential today

## 2012-12-03 LAB — PATHOLOGIST SMEAR REVIEW

## 2012-12-20 ENCOUNTER — Other Ambulatory Visit: Payer: Self-pay | Admitting: Neurosurgery

## 2012-12-20 DIAGNOSIS — M549 Dorsalgia, unspecified: Secondary | ICD-10-CM

## 2012-12-21 ENCOUNTER — Ambulatory Visit
Admission: RE | Admit: 2012-12-21 | Discharge: 2012-12-21 | Disposition: A | Discharge status: Home / Self Care | Payer: Medicare Other | Source: Ambulatory Visit | Attending: Neurosurgery | Admitting: Neurosurgery

## 2012-12-21 DIAGNOSIS — M549 Dorsalgia, unspecified: Secondary | ICD-10-CM

## 2012-12-21 MED ORDER — GADOBENATE DIMEGLUMINE 529 MG/ML IV SOLN
20.0000 mL | Freq: Once | INTRAVENOUS | Status: AC | PRN
  Administered 2012-12-21: 20 mL via INTRAVENOUS

## 2012-12-25 ENCOUNTER — Other Ambulatory Visit: Payer: Self-pay | Admitting: Neurosurgery

## 2012-12-26 ENCOUNTER — Encounter (HOSPITAL_COMMUNITY): Payer: Self-pay | Admitting: Pharmacy Technician

## 2013-01-01 ENCOUNTER — Encounter (HOSPITAL_COMMUNITY)
Admission: RE | Admit: 2013-01-01 | Discharge: 2013-01-01 | Disposition: A | Discharge status: Home / Self Care | Payer: Medicare Other | Source: Ambulatory Visit | Attending: Neurosurgery | Admitting: Neurosurgery

## 2013-01-01 ENCOUNTER — Encounter (HOSPITAL_COMMUNITY): Payer: Self-pay

## 2013-01-01 HISTORY — DX: Unspecified glaucoma: H40.9

## 2013-01-01 HISTORY — DX: Chronic leukemia of unspecified cell type not having achieved remission: C95.10

## 2013-01-01 HISTORY — DX: Gout, unspecified: M10.9

## 2013-01-01 LAB — BASIC METABOLIC PANEL
BUN: 23 mg/dL (ref 6–23)
CO2: 24 mEq/L (ref 19–32)
Calcium: 9.7 mg/dL (ref 8.4–10.5)
Chloride: 106 mEq/L (ref 96–112)
Creatinine, Ser: 1.47 mg/dL — ABNORMAL HIGH (ref 0.50–1.35)
GFR calc Af Amer: 51 mL/min — ABNORMAL LOW (ref >90)
GFR calc non Af Amer: 44 mL/min — ABNORMAL LOW (ref >90)
Glucose, Bld: 128 mg/dL — ABNORMAL HIGH (ref 70–99)
Potassium: 4.2 mEq/L (ref 3.5–5.1)
Sodium: 140 mEq/L (ref 135–145)

## 2013-01-01 LAB — CBC
HCT: 35.8 % — ABNORMAL LOW (ref 39.0–52.0)
Hemoglobin: 12.1 g/dL — ABNORMAL LOW (ref 13.0–17.0)
MCH: 30.5 pg (ref 26.0–34.0)
MCHC: 33.8 g/dL (ref 30.0–36.0)
MCV: 90.2 fL (ref 78.0–100.0)
Platelets: 162 10*3/uL (ref 150–400)
RBC: 3.97 MIL/uL — ABNORMAL LOW (ref 4.22–5.81)
RDW: 13.9 % (ref 11.5–15.5)
WBC: 13 10*3/uL — ABNORMAL HIGH (ref 4.0–10.5)

## 2013-01-01 LAB — TYPE AND SCREEN
ABO/RH(D): O POS
Antibody Screen: NEGATIVE

## 2013-01-01 LAB — SURGICAL PCR SCREEN
MRSA, PCR: NEGATIVE
Staphylococcus aureus: NEGATIVE

## 2013-01-01 LAB — ABO/RH: ABO/RH(D): O POS

## 2013-01-01 NOTE — Progress Notes (Signed)
01/01/13 1512  OBSTRUCTIVE SLEEP APNEA  Have you ever been diagnosed with sleep apnea through a sleep study? No  Do you snore loudly (loud enough to be heard through closed doors)?  0  Do you often feel tired, fatigued, or sleepy during the daytime? 0  Has anyone observed you stop breathing during your sleep? 0  Do you have, or are you being treated for high blood pressure? 1  BMI more than 35 kg/m2? 0  Age over 77 years old? 1  Neck circumference greater than 40 cm/18 inches? 1  Gender: 1  Obstructive Sleep Apnea Score 4  Score 4 or greater  Results sent to PCP   This patient has screened at risk for sleep apnea using the STOP Bang tool used during a pre-surgical visit. A score of 4 or greater is at risk for sleep apnea.

## 2013-01-02 MED ORDER — CEFAZOLIN SODIUM-DEXTROSE 2-3 GM-% IV SOLR
2.0000 g | INTRAVENOUS | Status: AC
  Administered 2013-01-03: 2 g via INTRAVENOUS
  Filled 2013-01-02: qty 50

## 2013-01-03 ENCOUNTER — Encounter (HOSPITAL_COMMUNITY)
Admission: RE | Disposition: A | Discharge status: Home / Self Care | Payer: Self-pay | Source: Ambulatory Visit | Attending: Neurosurgery

## 2013-01-03 ENCOUNTER — Inpatient Hospital Stay (HOSPITAL_COMMUNITY): Payer: Medicare Other

## 2013-01-03 ENCOUNTER — Inpatient Hospital Stay (HOSPITAL_COMMUNITY): Payer: Medicare Other | Admitting: Anesthesiology

## 2013-01-03 ENCOUNTER — Encounter (HOSPITAL_COMMUNITY): Payer: Self-pay | Admitting: Anesthesiology

## 2013-01-03 ENCOUNTER — Inpatient Hospital Stay (HOSPITAL_COMMUNITY)
Admission: RE | Admit: 2013-01-03 | Discharge: 2013-01-05 | DRG: 460 | Disposition: A | Discharge status: Home / Self Care | Payer: Medicare Other | Source: Ambulatory Visit | Attending: Neurosurgery | Admitting: Neurosurgery

## 2013-01-03 DIAGNOSIS — M5137 Other intervertebral disc degeneration, lumbosacral region: Secondary | ICD-10-CM | POA: Diagnosis present

## 2013-01-03 DIAGNOSIS — M4310 Spondylolisthesis, site unspecified: Principal | ICD-10-CM | POA: Diagnosis present

## 2013-01-03 DIAGNOSIS — I10 Essential (primary) hypertension: Secondary | ICD-10-CM | POA: Diagnosis present

## 2013-01-03 DIAGNOSIS — M51379 Other intervertebral disc degeneration, lumbosacral region without mention of lumbar back pain or lower extremity pain: Secondary | ICD-10-CM | POA: Diagnosis present

## 2013-01-03 DIAGNOSIS — E119 Type 2 diabetes mellitus without complications: Secondary | ICD-10-CM | POA: Diagnosis present

## 2013-01-03 DIAGNOSIS — E785 Hyperlipidemia, unspecified: Secondary | ICD-10-CM | POA: Diagnosis present

## 2013-01-03 DIAGNOSIS — M48061 Spinal stenosis, lumbar region without neurogenic claudication: Secondary | ICD-10-CM | POA: Diagnosis present

## 2013-01-03 DIAGNOSIS — C9510 Chronic leukemia of unspecified cell type not having achieved remission: Secondary | ICD-10-CM | POA: Diagnosis present

## 2013-01-03 DIAGNOSIS — H409 Unspecified glaucoma: Secondary | ICD-10-CM | POA: Diagnosis present

## 2013-01-03 HISTORY — PX: LUMBAR PERCUTANEOUS PEDICLE SCREW 1 LEVEL: SHX5560

## 2013-01-03 HISTORY — PX: ANTERIOR LAT LUMBAR FUSION: SHX1168

## 2013-01-03 LAB — GLUCOSE, CAPILLARY
Glucose-Capillary: 141 mg/dL — ABNORMAL HIGH (ref 70–99)
Glucose-Capillary: 128 mg/dL — ABNORMAL HIGH (ref 70–99)
Glucose-Capillary: 182 mg/dL — ABNORMAL HIGH (ref 70–99)

## 2013-01-03 SURGERY — ANTERIOR LATERAL LUMBAR FUSION 1 LEVEL
Anesthesia: General | Laterality: Left | Wound class: Clean

## 2013-01-03 MED ORDER — 0.9 % SODIUM CHLORIDE (POUR BTL) OPTIME
TOPICAL | Status: DC | PRN
  Administered 2013-01-03: 1000 mL

## 2013-01-03 MED ORDER — SODIUM CHLORIDE 0.9 % IJ SOLN: 3.0000 mL | INTRAMUSCULAR | Status: DC | PRN

## 2013-01-03 MED ORDER — PANTOPRAZOLE SODIUM 40 MG IV SOLR
40.0000 mg | Freq: Every day | INTRAVENOUS | Status: DC
  Administered 2013-01-03: 40 mg via INTRAVENOUS
  Filled 2013-01-03 (×2): qty 40

## 2013-01-03 MED ORDER — TAMSULOSIN HCL 0.4 MG PO CAPS
0.4000 mg | ORAL_CAPSULE | Freq: Every day | ORAL | Status: DC
  Administered 2013-01-03 – 2013-01-05 (×3): 0.4 mg via ORAL
  Filled 2013-01-03 (×4): qty 1

## 2013-01-03 MED ORDER — LACTATED RINGERS IV SOLN
INTRAVENOUS | Status: DC
  Administered 2013-01-03: 11:00:00 via INTRAVENOUS

## 2013-01-03 MED ORDER — PHENYLEPHRINE HCL 10 MG/ML IJ SOLN
INTRAMUSCULAR | Status: DC | PRN
  Administered 2013-01-03: 80 ug via INTRAVENOUS
  Administered 2013-01-03: 40 ug via INTRAVENOUS

## 2013-01-03 MED ORDER — PHENOL 1.4 % MT LIQD: 1.0000 | OROMUCOSAL | Status: DC | PRN

## 2013-01-03 MED ORDER — ACETAMINOPHEN 325 MG PO TABS: 650.0000 mg | ORAL_TABLET | ORAL | Status: DC | PRN

## 2013-01-03 MED ORDER — INSULIN ASPART 100 UNIT/ML ~~LOC~~ SOLN
0.0000 [IU] | Freq: Three times a day (TID) | SUBCUTANEOUS | Status: DC
  Administered 2013-01-04 – 2013-01-05 (×2): 2 [IU] via SUBCUTANEOUS

## 2013-01-03 MED ORDER — BUPIVACAINE HCL (PF) 0.25 % IJ SOLN
INTRAMUSCULAR | Status: DC | PRN
  Administered 2013-01-03: 10 mL
  Administered 2013-01-03: 24 mL

## 2013-01-03 MED ORDER — SODIUM CHLORIDE 0.9 % IV SOLN
250.0000 mL | INTRAVENOUS | Status: DC
  Administered 2013-01-03: 250 mL via INTRAVENOUS

## 2013-01-03 MED ORDER — PROPOFOL INFUSION 10 MG/ML OPTIME
INTRAVENOUS | Status: DC | PRN
  Administered 2013-01-03: 50 ug/kg/min via INTRAVENOUS

## 2013-01-03 MED ORDER — ONDANSETRON HCL 4 MG/2ML IJ SOLN
INTRAMUSCULAR | Status: DC | PRN
  Administered 2013-01-03: 4 mg via INTRAVENOUS

## 2013-01-03 MED ORDER — NEOSTIGMINE METHYLSULFATE 1 MG/ML IJ SOLN
INTRAMUSCULAR | Status: DC | PRN
  Administered 2013-01-03: 5 mg via INTRAVENOUS

## 2013-01-03 MED ORDER — HYDROMORPHONE HCL PF 1 MG/ML IJ SOLN
0.2500 mg | INTRAMUSCULAR | Status: DC | PRN
  Administered 2013-01-03 (×2): 0.5 mg via INTRAVENOUS

## 2013-01-03 MED ORDER — HYDROMORPHONE HCL PF 1 MG/ML IJ SOLN
1.0000 mg | INTRAMUSCULAR | Status: DC | PRN
  Administered 2013-01-03 – 2013-01-04 (×3): 1 mg via INTRAMUSCULAR
  Filled 2013-01-03 (×3): qty 1

## 2013-01-03 MED ORDER — ALUM & MAG HYDROXIDE-SIMETH 200-200-20 MG/5ML PO SUSP: 30.0000 mL | Freq: Four times a day (QID) | ORAL | Status: DC | PRN

## 2013-01-03 MED ORDER — HYDROMORPHONE HCL PF 1 MG/ML IJ SOLN
INTRAMUSCULAR | Status: AC
  Filled 2013-01-03: qty 1

## 2013-01-03 MED ORDER — HYDROCODONE-ACETAMINOPHEN 5-325 MG PO TABS
1.0000 | ORAL_TABLET | ORAL | Status: DC | PRN
  Administered 2013-01-03 – 2013-01-04 (×2): 2 via ORAL
  Administered 2013-01-05 (×2): 1 via ORAL
  Filled 2013-01-03: qty 2
  Filled 2013-01-03: qty 1
  Filled 2013-01-03: qty 2
  Filled 2013-01-03: qty 1

## 2013-01-03 MED ORDER — MIDAZOLAM HCL 5 MG/5ML IJ SOLN
INTRAMUSCULAR | Status: DC | PRN
  Administered 2013-01-03: 1 mg via INTRAVENOUS

## 2013-01-03 MED ORDER — LOSARTAN POTASSIUM 50 MG PO TABS
100.0000 mg | ORAL_TABLET | Freq: Every day | ORAL | Status: DC
  Administered 2013-01-03 – 2013-01-05 (×2): 100 mg via ORAL
  Filled 2013-01-03 (×4): qty 2

## 2013-01-03 MED ORDER — HEMOSTATIC AGENTS (NO CHARGE) OPTIME
TOPICAL | Status: DC | PRN
  Administered 2013-01-03: 1 via TOPICAL

## 2013-01-03 MED ORDER — ONDANSETRON HCL 4 MG/2ML IJ SOLN: 4.0000 mg | Freq: Once | INTRAMUSCULAR | Status: DC | PRN

## 2013-01-03 MED ORDER — OXYCODONE HCL 5 MG PO TABS: 5.0000 mg | ORAL_TABLET | Freq: Once | ORAL | Status: DC | PRN

## 2013-01-03 MED ORDER — VECURONIUM BROMIDE 10 MG IV SOLR
INTRAVENOUS | Status: DC | PRN
  Administered 2013-01-03: 1 mg via INTRAVENOUS
  Administered 2013-01-03: 3 mg via INTRAVENOUS
  Administered 2013-01-03: 2 mg via INTRAVENOUS
  Administered 2013-01-03: 1 mg via INTRAVENOUS
  Administered 2013-01-03: 3 mg via INTRAVENOUS

## 2013-01-03 MED ORDER — ALLOPURINOL 300 MG PO TABS
300.0000 mg | ORAL_TABLET | Freq: Every day | ORAL | Status: DC
  Administered 2013-01-03 – 2013-01-05 (×3): 300 mg via ORAL
  Filled 2013-01-03 (×4): qty 1

## 2013-01-03 MED ORDER — SODIUM CHLORIDE 0.9 % IR SOLN
Status: DC | PRN
  Administered 2013-01-03: 11:00:00

## 2013-01-03 MED ORDER — GLYCOPYRROLATE 0.2 MG/ML IJ SOLN
INTRAMUSCULAR | Status: DC | PRN
  Administered 2013-01-03: 0.6 mg via INTRAVENOUS

## 2013-01-03 MED ORDER — LACTATED RINGERS IV SOLN
INTRAVENOUS | Status: DC | PRN
  Administered 2013-01-03 (×2): via INTRAVENOUS

## 2013-01-03 MED ORDER — MENTHOL 3 MG MT LOZG: 1.0000 | LOZENGE | OROMUCOSAL | Status: DC | PRN

## 2013-01-03 MED ORDER — ONDANSETRON HCL 4 MG/2ML IJ SOLN: 4.0000 mg | INTRAMUSCULAR | Status: DC | PRN

## 2013-01-03 MED ORDER — PROPOFOL 10 MG/ML IV BOLUS
INTRAVENOUS | Status: DC | PRN
  Administered 2013-01-03: 120 mg via INTRAVENOUS
  Administered 2013-01-03: 50 mg via INTRAVENOUS
  Administered 2013-01-03: 30 mg via INTRAVENOUS

## 2013-01-03 MED ORDER — LIDOCAINE HCL (CARDIAC) 20 MG/ML IV SOLN
INTRAVENOUS | Status: DC | PRN
  Administered 2013-01-03: 100 mg via INTRAVENOUS

## 2013-01-03 MED ORDER — OXYCODONE HCL 5 MG/5ML PO SOLN: 5.0000 mg | Freq: Once | ORAL | Status: DC | PRN

## 2013-01-03 MED ORDER — POTASSIUM CHLORIDE IN NACL 20-0.45 MEQ/L-% IV SOLN
INTRAVENOUS | Status: DC
  Administered 2013-01-04: 1000 mL via INTRAVENOUS
  Filled 2013-01-03 (×6): qty 1000

## 2013-01-03 MED ORDER — CYCLOBENZAPRINE HCL 10 MG PO TABS: 10.0000 mg | ORAL_TABLET | Freq: Three times a day (TID) | ORAL | Status: DC | PRN

## 2013-01-03 MED ORDER — GLIPIZIDE ER 5 MG PO TB24
5.0000 mg | ORAL_TABLET | Freq: Every day | ORAL | Status: DC
  Administered 2013-01-04 – 2013-01-05 (×2): 5 mg via ORAL
  Filled 2013-01-03 (×3): qty 1

## 2013-01-03 MED ORDER — SUCCINYLCHOLINE CHLORIDE 20 MG/ML IJ SOLN
INTRAMUSCULAR | Status: DC | PRN
  Administered 2013-01-03: 150 mg via INTRAVENOUS

## 2013-01-03 MED ORDER — INSULIN ASPART 100 UNIT/ML ~~LOC~~ SOLN: 4.0000 [IU] | Freq: Three times a day (TID) | SUBCUTANEOUS | Status: DC

## 2013-01-03 MED ORDER — PHENYLEPHRINE HCL 10 MG/ML IJ SOLN
10.0000 mg | INTRAVENOUS | Status: DC | PRN
  Administered 2013-01-03: 50 ug/min via INTRAVENOUS

## 2013-01-03 MED ORDER — THROMBIN 5000 UNITS EX SOLR
CUTANEOUS | Status: DC | PRN
  Administered 2013-01-03 (×2): 5000 [IU] via TOPICAL

## 2013-01-03 MED ORDER — ACETAMINOPHEN 650 MG RE SUPP: 650.0000 mg | RECTAL | Status: DC | PRN

## 2013-01-03 MED ORDER — CEFAZOLIN SODIUM-DEXTROSE 2-3 GM-% IV SOLR
2.0000 g | Freq: Three times a day (TID) | INTRAVENOUS | Status: AC
  Administered 2013-01-03 – 2013-01-04 (×4): 2 g via INTRAVENOUS
  Filled 2013-01-03 (×6): qty 50

## 2013-01-03 MED ORDER — FENTANYL CITRATE 0.05 MG/ML IJ SOLN
INTRAMUSCULAR | Status: DC | PRN
  Administered 2013-01-03 (×2): 100 ug via INTRAVENOUS
  Administered 2013-01-03: 50 ug via INTRAVENOUS

## 2013-01-03 MED ORDER — DEXAMETHASONE SODIUM PHOSPHATE 10 MG/ML IJ SOLN
10.0000 mg | INTRAMUSCULAR | Status: AC
  Administered 2013-01-03: 10 mg via INTRAVENOUS

## 2013-01-03 MED ORDER — INSULIN ASPART 100 UNIT/ML ~~LOC~~ SOLN: 0.0000 [IU] | Freq: Every day | SUBCUTANEOUS | Status: DC

## 2013-01-03 MED ORDER — SODIUM CHLORIDE 0.9 % IJ SOLN
3.0000 mL | Freq: Two times a day (BID) | INTRAMUSCULAR | Status: DC
  Administered 2013-01-03: 3 mL via INTRAVENOUS

## 2013-01-03 MED ORDER — LOSARTAN POTASSIUM 50 MG PO TABS: 100.0000 mg | ORAL_TABLET | Freq: Every day | ORAL | Status: DC

## 2013-01-03 MED ORDER — MEPERIDINE HCL 25 MG/ML IJ SOLN: 6.2500 mg | INTRAMUSCULAR | Status: DC | PRN

## 2013-01-03 MED ORDER — EPHEDRINE SULFATE 50 MG/ML IJ SOLN
INTRAMUSCULAR | Status: DC | PRN
  Administered 2013-01-03: 5 mg via INTRAVENOUS

## 2013-01-03 SURGICAL SUPPLY — 76 items
ADH SKN CLS APL DERMABOND .7 (GAUZE/BANDAGES/DRESSINGS) ×4
APL SKNCLS STERI-STRIP NONHPOA (GAUZE/BANDAGES/DRESSINGS) ×4
BAG DECANTER FOR FLEXI CONT (MISCELLANEOUS) ×3 IMPLANT
BENZOIN TINCTURE PRP APPL 2/3 (GAUZE/BANDAGES/DRESSINGS) ×6 IMPLANT
BLADE SURG ROTATE 9660 (MISCELLANEOUS) IMPLANT
BONE EQUIVA 5CC (Bone Implant) ×3 IMPLANT
BRUSH SCRUB EZ PLAIN DRY (MISCELLANEOUS) ×3 IMPLANT
CLOTH BEACON ORANGE TIMEOUT ST (SAFETY) ×6 IMPLANT
CONT SPEC 4OZ CLIKSEAL STRL BL (MISCELLANEOUS) ×3 IMPLANT
COVER BACK TABLE 24X17X13 BIG (DRAPES) IMPLANT
COVER TABLE BACK 60X90 (DRAPES) ×3 IMPLANT
DERMABOND ADVANCED (GAUZE/BANDAGES/DRESSINGS) ×2
DERMABOND ADVANCED .7 DNX12 (GAUZE/BANDAGES/DRESSINGS) ×4 IMPLANT
DILATOR NON-RADIOLUCENT CANN (MISCELLANEOUS) ×6 IMPLANT
DRAPE C-ARM 42X72 X-RAY (DRAPES) ×9 IMPLANT
DRAPE C-ARMOR (DRAPES) ×6 IMPLANT
DRAPE LAPAROTOMY 100X72X124 (DRAPES) ×6 IMPLANT
DRAPE SURG 17X23 STRL (DRAPES) ×12 IMPLANT
DRESSING TELFA 8X3 (GAUZE/BANDAGES/DRESSINGS) IMPLANT
DRSG OPSITE POSTOP 3X4 (GAUZE/BANDAGES/DRESSINGS) ×6 IMPLANT
DURAPREP 26ML APPLICATOR (WOUND CARE) ×6 IMPLANT
ELECT BLADE 4.0 EZ CLEAN MEGAD (MISCELLANEOUS) ×3
ELECT REM PT RETURN 9FT ADLT (ELECTROSURGICAL) ×6
ELECTRODE BLDE 4.0 EZ CLN MEGD (MISCELLANEOUS) ×2 IMPLANT
ELECTRODE REM PT RTRN 9FT ADLT (ELECTROSURGICAL) ×4 IMPLANT
EVACUATOR 1/8 PVC DRAIN (DRAIN) IMPLANT
FORCEPS BPLR BAYO 10IN 1.0TIP (INSTRUMENTS) ×3 IMPLANT
GAUZE SPONGE 4X4 16PLY XRAY LF (GAUZE/BANDAGES/DRESSINGS) IMPLANT
GLOVE BIO SURGEON STRL SZ8 (GLOVE) IMPLANT
GLOVE BIOGEL PI IND STRL 7.0 (GLOVE) ×4 IMPLANT
GLOVE BIOGEL PI IND STRL 7.5 (GLOVE) ×10 IMPLANT
GLOVE BIOGEL PI IND STRL 8 (GLOVE) ×6 IMPLANT
GLOVE BIOGEL PI INDICATOR 7.0 (GLOVE) ×2
GLOVE BIOGEL PI INDICATOR 7.5 (GLOVE) ×5
GLOVE BIOGEL PI INDICATOR 8 (GLOVE) ×3
GLOVE ECLIPSE 7.0 STRL STRAW (GLOVE) ×6 IMPLANT
GLOVE ECLIPSE 7.5 STRL STRAW (GLOVE) ×21 IMPLANT
GLOVE ECLIPSE 8.0 STRL XLNG CF (GLOVE) ×6 IMPLANT
GLOVE EXAM NITRILE LRG STRL (GLOVE) ×9 IMPLANT
GLOVE EXAM NITRILE XL STR (GLOVE) IMPLANT
GLOVE EXAM NITRILE XS STR PU (GLOVE) IMPLANT
GOWN BRE IMP SLV AUR LG STRL (GOWN DISPOSABLE) ×9 IMPLANT
GOWN BRE IMP SLV AUR XL STRL (GOWN DISPOSABLE) ×6 IMPLANT
GOWN STRL REIN 2XL LVL4 (GOWN DISPOSABLE) ×9 IMPLANT
K-WIRE  1.6X 450L (WIRE) ×1
K-WIRE 1.6X 450L (WIRE) ×2
K-WIRE NITHNOL TROCAR TIP (WIRE) ×12 IMPLANT
KIT BASIN OR (CUSTOM PROCEDURE TRAY) ×6 IMPLANT
KIT DISP MARS 3V (KITS) ×3 IMPLANT
KIT PEDICLE ACCESS (KITS) ×3 IMPLANT
KIT ROOM TURNOVER OR (KITS) ×6 IMPLANT
KWIRE 1.6X 450L (WIRE) ×2 IMPLANT
NEEDLE HYPO 22GX1.5 SAFETY (NEEDLE) ×6 IMPLANT
NEEDLE TARGETING (NEEDLE) ×12 IMPLANT
NEURO MONITORING STIM (LABOR (TRAVEL & OVERTIME)) ×3 IMPLANT
NS IRRIG 1000ML POUR BTL (IV SOLUTION) ×3 IMPLANT
PACK LAMINECTOMY NEURO (CUSTOM PROCEDURE TRAY) ×6 IMPLANT
ROD PATHFINDER PERC .45MM (Rod) ×6 IMPLANT
SCREW MIN INVASIVE 6.5X45 (Screw) ×9 IMPLANT
SCREW POLYAXIA MIS 6.5X40MM (Screw) ×3 IMPLANT
SHIM STAINLESS (MISCELLANEOUS) ×3 IMPLANT
SPACER CALIBER 11X50 9-13-0LUM (Spacer) ×3 IMPLANT
SPONGE GAUZE 4X4 12PLY (GAUZE/BANDAGES/DRESSINGS) ×3 IMPLANT
SPONGE LAP 4X18 X RAY DECT (DISPOSABLE) IMPLANT
SPONGE SURGIFOAM ABS GEL SZ50 (HEMOSTASIS) ×3 IMPLANT
STRIP CLOSURE SKIN 1/2X4 (GAUZE/BANDAGES/DRESSINGS) ×3 IMPLANT
SUT VIC AB 2-0 OS6 18 (SUTURE) ×18 IMPLANT
SUT VIC AB 3-0 CP2 18 (SUTURE) ×6 IMPLANT
SYR 20ML ECCENTRIC (SYRINGE) ×3 IMPLANT
TAPE CLOTH 3X10 TAN LF (GAUZE/BANDAGES/DRESSINGS) ×6 IMPLANT
TOP CLSR SEQUOIA (Orthopedic Implant) ×12 IMPLANT
TOWEL OR 17X24 6PK STRL BLUE (TOWEL DISPOSABLE) ×6 IMPLANT
TOWEL OR 17X26 10 PK STRL BLUE (TOWEL DISPOSABLE) ×6 IMPLANT
TRAP SPECIMEN MUCOUS 40CC (MISCELLANEOUS) ×3 IMPLANT
TRAY FOLEY CATH 14FRSI W/METER (CATHETERS) IMPLANT
WATER STERILE IRR 1000ML POUR (IV SOLUTION) ×6 IMPLANT

## 2013-01-03 NOTE — OR Nursing (Signed)
Procedure one ended at 1315 patient reposition at 1325

## 2013-01-03 NOTE — OR Nursing (Signed)
NEEDLE ELECTRODES PLACED BY STIM REP FOR PROCEDURE ONE

## 2013-01-03 NOTE — Transfer of Care (Signed)
Immediate Anesthesia Transfer of Care Note  Patient: [name]  Procedure(s) Performed: Procedure(s): ANTERIOR LATERAL LUMBAR FUSION lumbar three-four (Left) LUMBAR PERCUTANEOUS PEDICLE SCREWS LUMBAR THREE-FOUR  Patient Location: PACU  Anesthesia Type:General  Level of Consciousness: awake, alert , oriented and patient cooperative  Airway & Oxygen Therapy: Patient Spontanous Breathing and Patient connected to face mask oxygen  Post-op Assessment: Report given to PACU RN and Post -op Vital signs reviewed and stable  Post vital signs: Reviewed and stable  Complications: No apparent anesthesia complications

## 2013-01-03 NOTE — H&P (Signed)
Kevin Rogers is an 79 y.o. male.   Chief Complaint: Low back pain HPI: The patient is a 79 year old gentleman who is an old patient who had back surgery many years ago and did extremely well at that time. He now complains of low back pain which is quite debilitating and severe of a few years duration. He had an MRI scan back in 2010 but was not seen at that time. He said his medical Dr. also seen orthopedic and had injection did not give him any relief. When seen for evaluation the patient had millimeters studies. He says that his pain was in his midlumbar spine there is minimal radicular component. He does feel his legs are weak. After getting a new MRI scan which showed a spondylolisthesis with moderate stenosis at L3-4 the options were discussed. The patient requested surgery now comes for a lateral lumbar interbody fusion with pedicle screw fixation. I've had a long discussion with him regarding the risks and benefits of surgical intervention. The risks discussed include but are not limited to bleeding infection weakness numbness paralysis trouble with instrumentation nonunion issues particular to the xlif coma and death. We have discussed alternative methods of therapy offered risks and benefits of nonintervention. He's had the opportunity to ask numerous questions and appears to understand. With this information in hand he has requested we proceed with surgery.  Past Medical History  Diagnosis Date  . Diabetes mellitus   . Hyperlipidemia   . Hypertension   . Leukemia, chronic 2001    does not take treatment just has blood levels checked once a year  . Glaucoma     left eye  . Gout     Past Surgical History  Procedure Laterality Date  . Lumbar laminectomy  1997  . Hernia repair  1989  . Tonsillectomy    . Colonoscopy w/ polypectomy    . Dental surgery      implanted teeth x 2    Family History  Problem Relation Age of Onset  . Heart disease Mother   . Heart disease Father     Social History:  reports that he quit smoking about 17 years ago. His smoking use included Cigarettes. He smoked 0.00 packs per day. He has never used smokeless tobacco. He reports that he drinks about 0.6 ounces of alcohol per week. He reports that he does not use illicit drugs.  Allergies: No Known Allergies  Medications Prior to Admission  Medication Sig Dispense Refill  . allopurinol (ZYLOPRIM) 300 MG tablet Take 300 mg by mouth daily.      [name] glipiZIDE (GLUCOTROL XL) 5 MG 24 hr tablet Take 5 mg by mouth daily.      [name] HYDROcodone-acetaminophen (NORCO) 10-325 MG per tablet Take 1 tablet by mouth every 6 (six) hours as needed for pain.      [name] ibuprofen (ADVIL,MOTRIN) 200 MG tablet Take 200 mg by mouth every 6 (six) hours as needed for pain.      [name] losartan (COZAAR) 100 MG tablet Take 100 mg by mouth daily.      . meloxicam (MOBIC) 15 MG tablet Take 15 mg by mouth daily.      . tamsulosin (FLOMAX) 0.4 MG CAPS capsule Take 0.4 mg by mouth daily.      . travoprost, benzalkonium, (TRAVATAN) 0.004 % ophthalmic solution Place 1 drop into the left eye at bedtime.       . vitamin B-12 (CYANOCOBALAMIN) 1000 MCG tablet Take 1,000 mcg by mouth daily.  Results for orders placed during the hospital encounter of 01/03/13 (from the past 48 hour(s))  GLUCOSE, CAPILLARY     Status: Abnormal   Collection Time    01/03/13  9:45 AM      Result Value Range   Glucose-Capillary 141 (*) 70 - 99 mg/dL   Dg Chest 2 View  05/07/1094   *RADIOLOGY REPORT*  Clinical Data: Preoperative evaluation for lumbar spine surgery, former smoker, hypertension  CHEST - 2 VIEW  Comparison: 12/26/2006  Findings: Normal heart size and vascularity.  Lungs clear.  No pneumonia, edema, effusion or pneumothorax.  Trachea midline. Minor thoracic degenerative change.  IMPRESSION: Stable exam.  No acute process   Original Report Authenticated By: Judie Petit. Miles Costain, M.D.    The patient has diabetes and high blood pressure for which he  takes medications.  Blood pressure 139/72, pulse 77, temperature 98 F (36.7 C), temperature source Oral, resp. rate 20, SpO2 98.00%.  The patient is awake or and oriented. His no facial asymmetry. There is no aphasia. His strength is 5 over 5. Reflexes are decreased but equal. Assessment/Plan Impression is that of spondylolisthesis at L3-4. The plan is for an L3 for X. left with pedicle screw instrumentation.  Reinaldo Meeker, MD 01/03/2013, 11:14 AM

## 2013-01-03 NOTE — Anesthesia Preprocedure Evaluation (Addendum)
Anesthesia Evaluation  Patient identified by MRN, date of birth, ID band Patient awake    Reviewed: Allergy & Precautions, H&P , NPO status   History of Anesthesia Complications Negative for: history of anesthetic complications  Airway Mallampati: II TM Distance: >3 FB Neck ROM: Full    Dental  (+) Teeth Intact, Implants and Dental Advisory Given   Pulmonary former smoker,  breath sounds clear to auscultation        Cardiovascular hypertension, Pt. on medications Rhythm:Regular Rate:Normal     Neuro/Psych negative psych ROS   GI/Hepatic negative GI ROS, Neg liver ROS,   Endo/Other  diabetes, Well Controlled, Type 2, Oral Hypoglycemic Agents  Renal/GU negative Renal ROS     Musculoskeletal   Abdominal   Peds  Hematology  (+) Blood dyscrasia, ,   Anesthesia Other Findings   Reproductive/Obstetrics negative OB ROS                          Anesthesia Physical Anesthesia Plan  ASA: II  Anesthesia Plan: General   Post-op Pain Management:    Induction: Intravenous  Airway Management Planned: Oral ETT  Additional Equipment:   Intra-op Plan:   Post-operative Plan: Extubation in OR  Informed Consent: I have reviewed the patients History and Physical, chart, labs and discussed the procedure including the risks, benefits and alternatives for the proposed anesthesia with the patient or authorized representative who has indicated his/her understanding and acceptance.     Plan Discussed with: CRNA and Surgeon  Anesthesia Plan Comments:         Anesthesia Quick Evaluation

## 2013-01-03 NOTE — Op Note (Signed)
Preop diagnosis: Spondylolisthesis with stenosis L3-4 Postop diagnosis: Same Procedure: L3-4 anterolateral fusion via left retroperitoneum L3 for percutaneous pedicle screws with Pathfinder pedicle screw system Surgeon: Aribella Vavra Assistant: Conchita Paris  After being placed in the left side up lateral decubitus position the patient's position was aligned with AP lateral fluoroscopy. Patient was prepped and draped in the usual sterile fashion. Incision was made in the left flank in line with the L3-4 interspace. This was carried down to but not through the lateral fascia. A second incision was then made posteriorly and with finger dissection were able to enter the retroperitoneal without difficulty. We then turned our finger upward to allow access into the retroperitoneum for the more anterior incision. We did sequential dilation through the psoas muscle on the left using intraoperative EMG monitoring to allow safe passage. We got no abnormal readings. We placed a retractor in standard fashion secured to the table through the arm. We confirmed good placed with AP lateral fluoroscopy. We opened the retractor a small amount and did additional testing 40 nerve location and abnormal readings were identified. We then secured the retractor with the shim. We coagulated any residual muscle fibers on the disc space incised the disc a 15 blade. Using a variety of instruments we thoroughly cleaned out the disc space. Release the annulus on the opposite side with the small Cobb elevator. We then cleaned off the endplates thoroughly. We then used trials were able to get a 9 mm height trial in without difficulty and we felt that we would start with that of an expanded upward. We therefore chose a 9 x 18 x 50 mm cage and filled with a mixture of morselized allograft. We irrigated copiously and then impacted the spacer into the disc space without difficulty. We then expanded the cage up about 1 mm ML resistance and felt this was  not with excellent fit. We irrigated copiously and then removed the retractor. AP lateral fluoroscopy looked excellent. We closed the wound in multiple layers of Vicryl on the fascia subcutaneous and subcuticular tissues we put Dermabond on the skin. We then placed a sterile dressing to the patient the prone position prepped and draped in standard fashion. We then put guidewires through the pedicle in standard fashion placed in the Jamshidi needle. We followed this in AP lateral fluoroscopy and in excellent position. Removed the Jamshidi needles and the wires behind. We then did sequential dilation and then broke the bony surface with the bone all. We tapped with a 6.0 mm tap and then placed 6.5 x 45 mm screws at L3 bilaterally and on the right at L4. We placed a 6.5 x 40 mm screw at L4 on the left. We then incised the fascia between the [facility] and placed appropriate length rods without difficulty. Secured the rod to the top of the screws with the down loading nut not reduce the rod into the screw head. We then did tightening and final tightening with torque and counter torque and then removed the [place]. Final fluoroscopy AP lateral direction looked excellent. We irrigated both wounds copiously and closed in multiple layers of Vicryl on the fascia subcutaneous and subcuticular tissues. Dermabond Steri-Strips were placed on the skin. Shortness was then applied the patient was extubated and taken to recovery room in stable condition.

## 2013-01-03 NOTE — Preoperative (Signed)
Beta Blockers   Reason not to administer Beta Blockers:Not Applicable 

## 2013-01-03 NOTE — OR Nursing (Signed)
NEEDLE ELECTRODES REMOVED BY STIM REP. AT END OF FIRST PROCEDURE

## 2013-01-03 NOTE — Anesthesia Postprocedure Evaluation (Signed)
  Anesthesia Post-op Note  Patient: Kevin Rogers  Procedure(s) Performed: Procedure(s): ANTERIOR LATERAL LUMBAR FUSION lumbar three-four (Left) LUMBAR PERCUTANEOUS PEDICLE SCREWS LUMBAR THREE-FOUR  Patient Location: PACU  Anesthesia Type:General  Level of Consciousness: awake, alert , oriented and patient cooperative  Airway and Oxygen Therapy: Patient Spontanous Breathing and Patient connected to nasal cannula oxygen  Post-op Pain: mild  Post-op Assessment: Post-op Vital signs reviewed, Patient's Cardiovascular Status Stable, Respiratory Function Stable, Patent Airway, No signs of Nausea or vomiting and Pain level controlled  Post-op Vital Signs: Reviewed and stable  Complications: No apparent anesthesia complications

## 2013-01-04 ENCOUNTER — Encounter (HOSPITAL_COMMUNITY): Payer: Self-pay | Admitting: Neurosurgery

## 2013-01-04 LAB — GLUCOSE, CAPILLARY
Glucose-Capillary: 139 mg/dL — ABNORMAL HIGH (ref 70–99)
Glucose-Capillary: 111 mg/dL — ABNORMAL HIGH (ref 70–99)
Glucose-Capillary: 111 mg/dL — ABNORMAL HIGH (ref 70–99)
Glucose-Capillary: 159 mg/dL — ABNORMAL HIGH (ref 70–99)

## 2013-01-04 MED ORDER — SODIUM CHLORIDE 0.9 % IV BOLUS (SEPSIS): 500.0000 mL | Freq: Once | INTRAVENOUS | Status: DC

## 2013-01-04 MED ORDER — PANTOPRAZOLE SODIUM 40 MG PO TBEC
40.0000 mg | DELAYED_RELEASE_TABLET | Freq: Every day | ORAL | Status: DC
  Administered 2013-01-04 – 2013-01-05 (×2): 40 mg via ORAL
  Filled 2013-01-04 (×2): qty 1

## 2013-01-04 NOTE — Progress Notes (Signed)
Patient ID: Kevin Rogers, male   DOB: June 10, 1931, 79 y.o.   MRN: [id number] Afeb,vss Incisional pain only. Started ambulating well. Wounds clean and dry Will increase activity today. He will be ready to go home in the am.

## 2013-01-04 NOTE — Progress Notes (Signed)
Patient due to void at 1300.  He did not.  Bladder scan showed . Patient has attempted to void several times and is not complaining of pain or pressure.  He also has a history of difficulty urinating and is also taking flomax.  Will continue to monitor and will re-scan patient in an hour.  Lance Bosch, RN

## 2013-01-04 NOTE — Progress Notes (Signed)
Patient hypotensive.  He is asymptomatic and has not received pain medication since this morning.  Dr. Gerlene Fee ordered 500cc bolus.  Will recheck blood pressure when infusion complete and continue to monitor patient.  Lance Bosch, RN

## 2013-01-04 NOTE — Progress Notes (Signed)
   CARE MANAGEMENT NOTE 01/04/2013  Patient:  Kevin Rogers, Kevin Rogers   Account Number:  [id number]  Date Initiated:  01/04/2013  Documentation initiated by:  Jiles Crocker  Subjective/Objective Assessment:   ADMITTED FOR SURGERY - L3-4 anterolateral fusion     Action/Plan:   LIVES ALONE; CM FOLLOWING FOR DCP   Anticipated DC Date:  01/07/2013   Anticipated DC Plan: POSSIBLY HOME W HOME HEALTH SERVICES      DC Planning Services  CM consult          Status of service:  In process, will continue to follow Medicare Important Message given?  NA - LOS <3 / Initial given by admissions (If response is "NO", the following Medicare IM given date fields will be blank)  Per UR Regulation:  Reviewed for med. necessity/level of care/duration of stay  Comments:  01/04/2013- B Khalessi Blough RN,BSN,MHA

## 2013-01-04 NOTE — Progress Notes (Signed)
Patient hypotensive.  I restarted his fluids and will continue to monitor.  Patient is asymptomatic.

## 2013-01-04 NOTE — Progress Notes (Signed)
Patient still feels no urge to void.  He says he is not uncomfortable and his abdomen is not distended and is soft to the touch.  Bladder scan still <300.  Will continue to monitor and encourage po intake.  Lance Bosch, RN

## 2013-01-05 LAB — GLUCOSE, CAPILLARY: Glucose-Capillary: 121 mg/dL — ABNORMAL HIGH (ref 70–99)

## 2013-01-05 MED ORDER — CYCLOBENZAPRINE HCL 10 MG PO TABS: 10.0000 mg | ORAL_TABLET | Freq: Three times a day (TID) | ORAL | Status: DC | PRN

## 2013-01-05 MED ORDER — HYDROCODONE-ACETAMINOPHEN 5-325 MG PO TABS: 1.0000 | ORAL_TABLET | ORAL | Status: DC | PRN

## 2013-01-05 NOTE — Discharge Summary (Signed)
  Physician Discharge Summary  Patient ID: Brach Birdsall MRN: [id number] DOB/AGE: [birth year] 77 y.o.  Admit date: 01/03/2013 Discharge date: 01/05/2013  Admission Diagnoses: Degenerative disc disease lumbar spinal stenosis  Discharge Diagnoses: Same Active Problems:   * No active hospital problems. *   Discharged Condition: good  Hospital Course: Patient to the hospital underwent anterolateral interbody fusion postoperatively patient did very well recovered in the floor he was observed over the first 48 hours in the floor and progressed name Hyacinth Meeker did well was voiding spontaneously and tolerating a regular diet. At the time of discharge is significant improvement of his radicular symptoms in his back pain was well-controlled on oral analgesics. He'll be discharged home.  Consults: Significant Diagnostic Studies: Treatments: Anterolateral interbody fusion lumbar Discharge Exam: Blood pressure 112/63, pulse 84, temperature 98.9 F (37.2 C), temperature source Oral, resp. rate 20, height 6' (1.829 m), weight 113.399 kg (250 lb), SpO2 95.00%. Strength out of 5 wound clean and dry  Disposition: Home   Future Appointments Provider Department Dept Phone   02/12/2013 8:30 AM Dava Najjar [facility] [phone]   02/12/2013 9:00 AM [facility] [place] [facility] [phone]   04/05/2013 11:30 AM Stacie Glaze, MD [facility] [place] [phone]       Medication List         allopurinol 300 MG tablet  Commonly known as:  ZYLOPRIM  Take 300 mg by mouth daily.     cyclobenzaprine 10 MG tablet  Commonly known as:  FLEXERIL  Take 1 tablet (10 mg total) by mouth 3 (three) times daily as needed for muscle spasms.     glipiZIDE 5 MG 24 hr tablet  Commonly known as:  GLUCOTROL XL  Take 5 mg by mouth daily.     HYDROcodone-acetaminophen 5-325 MG per tablet  Commonly known as:  NORCO/VICODIN   Take 1-2 tablets by mouth every 4 (four) hours as needed.     HYDROcodone-acetaminophen 10-325 MG per tablet  Commonly known as:  NORCO  Take 1 tablet by mouth every 6 (six) hours as needed for pain.     ibuprofen 200 MG tablet  Commonly known as:  ADVIL,MOTRIN  Take 200 mg by mouth every 6 (six) hours as needed for pain.     losartan 100 MG tablet  Commonly known as:  COZAAR  Take 100 mg by mouth daily.     meloxicam 15 MG tablet  Commonly known as:  MOBIC  Take 15 mg by mouth daily.     tamsulosin 0.4 MG Caps capsule  Commonly known as:  FLOMAX  Take 0.4 mg by mouth daily.     travoprost (benzalkonium) 0.004 % ophthalmic solution  Commonly known as:  TRAVATAN  Place 1 drop into the left eye at bedtime.     vitamin B-12 1000 MCG tablet  Commonly known as:  CYANOCOBALAMIN  Take 1,000 mcg by mouth daily.           Follow-up Information   Follow up with Reinaldo Meeker, MD.   Specialty:  Neurosurgery   Contact information:   [address] [place] [address] [phone]       Signed: Shannette Tabares P 01/05/2013, 9:10 AM

## 2013-01-05 NOTE — Progress Notes (Signed)
Patient ID: Kevin Rogers, male   DOB: 17-Mar-1932, 79 y.o.   MRN: [id number] Well significant room and leg pain very mild amount of burning in his anterior right quad strength out of 5 wound clean and dry

## 2013-02-12 ENCOUNTER — Ambulatory Visit (HOSPITAL_BASED_OUTPATIENT_CLINIC_OR_DEPARTMENT_OTHER): Payer: Medicare Other | Admitting: Internal Medicine

## 2013-02-12 ENCOUNTER — Other Ambulatory Visit: Payer: Self-pay | Admitting: Internal Medicine

## 2013-02-12 ENCOUNTER — Other Ambulatory Visit (HOSPITAL_BASED_OUTPATIENT_CLINIC_OR_DEPARTMENT_OTHER): Payer: Medicare Other | Admitting: Lab

## 2013-02-12 ENCOUNTER — Telehealth: Payer: Self-pay | Admitting: Internal Medicine

## 2013-02-12 VITALS — BP 156/80 | HR 100 | Temp 97.6°F | Resp 20 | Ht 72.0 in | Wt 258.9 lb

## 2013-02-12 DIAGNOSIS — C9110 Chronic lymphocytic leukemia of B-cell type not having achieved remission: Secondary | ICD-10-CM

## 2013-02-12 DIAGNOSIS — E119 Type 2 diabetes mellitus without complications: Secondary | ICD-10-CM

## 2013-02-12 LAB — COMPREHENSIVE METABOLIC PANEL (CC13)
ALT: 13 U/L (ref 0–55)
AST: 14 U/L (ref 5–34)
Albumin: 3.6 g/dL (ref 3.5–5.0)
Alkaline Phosphatase: 91 U/L (ref 40–150)
Anion Gap: 11 mEq/L (ref 3–11)
BUN: 22.2 mg/dL (ref 7.0–26.0)
CO2: 21 mEq/L — ABNORMAL LOW (ref 22–29)
Calcium: 10.1 mg/dL (ref 8.4–10.4)
Chloride: 109 mEq/L (ref 98–109)
Creatinine: 1.5 mg/dL — ABNORMAL HIGH (ref 0.7–1.3)
Glucose: 181 mg/dl — ABNORMAL HIGH (ref 70–140)
Potassium: 4.3 mEq/L (ref 3.5–5.1)
Sodium: 141 mEq/L (ref 136–145)
Total Bilirubin: 0.63 mg/dL (ref 0.20–1.20)
Total Protein: 6.8 g/dL (ref 6.4–8.3)

## 2013-02-12 LAB — CBC WITH DIFFERENTIAL/PLATELET
BASO%: 0.1 % (ref 0.0–2.0)
Basophils Absolute: 0 10*3/uL (ref 0.0–0.1)
EOS%: 0.9 % (ref 0.0–7.0)
Eosinophils Absolute: 0.1 10*3/uL (ref 0.0–0.5)
HCT: 36.8 % — ABNORMAL LOW (ref 38.4–49.9)
HGB: 11.9 g/dL — ABNORMAL LOW (ref 13.0–17.1)
LYMPH%: 71.9 % — ABNORMAL HIGH (ref 14.0–49.0)
MCH: 30 pg (ref 27.2–33.4)
MCHC: 32.4 g/dL (ref 32.0–36.0)
MCV: 92.8 fL (ref 79.3–98.0)
MONO#: 0.2 10*3/uL (ref 0.1–0.9)
MONO%: 1.7 % (ref 0.0–14.0)
NEUT#: 3.6 10*3/uL (ref 1.5–6.5)
NEUT%: 25.4 % — ABNORMAL LOW (ref 39.0–75.0)
Platelets: 210 10*3/uL (ref 140–400)
RBC: 3.97 10*6/uL — ABNORMAL LOW (ref 4.20–5.82)
RDW: 14.1 % (ref 11.0–14.6)
WBC: 14.2 10*3/uL — ABNORMAL HIGH (ref 4.0–10.3)
lymph#: 10.2 10*3/uL — ABNORMAL HIGH (ref 0.9–3.3)

## 2013-02-12 LAB — LACTATE DEHYDROGENASE (CC13): LDH: 135 U/L (ref 125–245)

## 2013-02-12 LAB — TECHNOLOGIST REVIEW

## 2013-02-12 NOTE — Telephone Encounter (Signed)
[facility] PRINTED APPT SCHED AND AVS FOR PT FOR oct 2015

## 2013-02-12 NOTE — Progress Notes (Signed)
[facility] OFFICE PROGRESS NOTE  Carrie Mew, MD [address] [place] [address]  DIAGNOSIS: CLL - Plan: CBC with Differential, Comprehensive metabolic panel  Chief Complaint  Patient presents with  . CLL    CURRENT THERAPY: Observation only.   INTERVAL HISTORY: Kevin Rogers 79 y.o. male with a history of CLL returns for followup. He has stage 0, CD20 positive for chronic lymphocytic leukemia. He was last seen by a nurse practitioner Larina Bras on 02/17/2012. He reports since his last visit he's been doing overall pretty well. He did however have surgery 5 weeks ago for an L4/L5 fusion secondary to degenerative disc disease lumbar spinal stenosis that required a 2-day hospitalization from 09/04-09/06. During this hospitalization he denies any complications such as bleeding or infection. He he denies any emergency room visits or hospitalizations or any recent pneumonias or urinary tract infections. His last colonoscopy was done in 2008 with removal of polyps. His appetite is good his weight is stable. He reports to add at 10 pain primarily to the back aggravated with movement alleviated with immobility. He wears a back brace.  He continued to take oral anti-glycemic medications for his diabetes. He denies any unusual bleeding/bruising, fever, chills, night sweats, dyspnea/cough, nausea, vomiting, constipation, diarrhea, abdominal pain, dysuria, urinary frequency or hematuria.  MEDICAL HISTORY: Past Medical History  Diagnosis Date  . Diabetes mellitus   . Hyperlipidemia   . Hypertension   . Leukemia, chronic 2001    does not take treatment just has blood levels checked once a year  . Glaucoma     left eye  . Gout     INTERIM HISTORY: has CLL; DIABETES MELLITUS, TYPE II; B12 DEFICIENCY; GOUT; ERECTILE DYSFUNCTION; POLYNEUROPATHY IN DIABETES; GLAUCOMA; HYPERTENSION; ACUTE BRONCHITIS; OSTEOARTHRITIS; LOW BACK PAIN, ACUTE; Thoracic or lumbosacral  neuritis or radiculitis, unspecified; and BACK PAIN on his problem list.    ALLERGIES:  has No Known Allergies.  MEDICATIONS: has a current medication list which includes the following prescription(s): allopurinol, cyclobenzaprine, glipizide, hydrocodone-acetaminophen, hydrocodone-acetaminophen, ibuprofen, losartan, meloxicam, travoprost (benzalkonium), vitamin b-12, and tamsulosin.  SURGICAL HISTORY:  Past Surgical History  Procedure Laterality Date  . Lumbar laminectomy  1997  . Hernia repair  1989  . Tonsillectomy    . Colonoscopy w/ polypectomy    . Dental surgery      implanted teeth x 2  . Anterior lat lumbar fusion Left 01/03/2013    Procedure: ANTERIOR LATERAL LUMBAR FUSION lumbar three-four;  Surgeon: Reinaldo Meeker, MD;  Location: [facility];  Service: Neurosurgery;  Laterality: Left;  . Lumbar percutaneous pedicle screw 1 level  01/03/2013    Procedure: LUMBAR PERCUTANEOUS PEDICLE SCREWS LUMBAR THREE-FOUR;  Surgeon: Reinaldo Meeker, MD;  Location: [facility];  Service: Neurosurgery;;    REVIEW OF SYSTEMS:   Constitutional: Denies fevers, chills or abnormal weight loss Eyes: Denies blurriness of vision Ears, nose, mouth, throat, and face: Denies mucositis or sore throat Respiratory: Denies cough, dyspnea or wheezes Cardiovascular: Denies palpitation, chest discomfort or lower extremity swelling Gastrointestinal:  Denies nausea, heartburn or change in bowel habits Skin: Denies abnormal skin rashes Lymphatics: Denies new lymphadenopathy or easy bruising Neurological:Denies numbness, tingling or new weaknesses Behavioral/Psych: Mood is stable, no new changes  All other systems were reviewed with the patient and are negative.  PHYSICAL EXAMINATION: ECOG PERFORMANCE STATUS: 1 - Symptomatic but completely ambulatory  Blood pressure 156/80, pulse 100, temperature 97.6 F (36.4 C), temperature source Oral, resp. rate 20, height  6' (1.829 m), weight 258 lb 14.4 oz (117.436  kg).  GENERAL:alert, no distress and comfortable; he is wearing  back brace  SKIN: skin color, texture, turgor are normal, no rashes or significant lesions EYES: normal, Conjunctiva are pink and non-injected, sclera clear OROPHARYNX:no exudate, no erythema and lips, buccal mucosa, and tongue normal  NECK: supple, thyroid normal size, non-tender, without nodularity LYMPH:  no palpable lymphadenopathy in the cervical, axillary or supraclavicular LUNGS: clear to auscultation and percussion with normal breathing effort HEART: regular rate & rhythm and no murmurs and no lower extremity edema ABDOMEN:abdomen soft, non-tender and normal bowel sounds Musculoskeletal:no cyanosis of digits and no clubbing  NEURO: alert & oriented x 3 with fluent speech, no focal motor/sensory deficits  LABORATORY DATA: Results for orders placed in visit on 02/12/13 (from the past 48 hour(s))  LACTATE DEHYDROGENASE (CC13)     Status: None   Collection Time    02/12/13  8:45 AM      Result Value Range   LDH 135  125 - 245 U/L  CBC WITH DIFFERENTIAL     Status: Abnormal   Collection Time    02/12/13  8:45 AM      Result Value Range   WBC 14.2 (*) 4.0 - 10.3 10e3/uL   NEUT# 3.6  1.5 - 6.5 10e3/uL   HGB 11.9 (*) 13.0 - 17.1 g/dL   HCT 16.1 (*) 09.6 - 04.5 %   Platelets 210  140 - 400 10e3/uL   MCV 92.8  79.3 - 98.0 fL   MCH 30.0  27.2 - 33.4 pg   MCHC 32.4  32.0 - 36.0 g/dL   RBC 4.09 (*) 8.11 - 9.14 10e6/uL   RDW 14.1  11.0 - 14.6 %   lymph# 10.2 (*) 0.9 - 3.3 10e3/uL   MONO# 0.2  0.1 - 0.9 10e3/uL   Eosinophils Absolute 0.1  0.0 - 0.5 10e3/uL   Basophils Absolute 0.0  0.0 - 0.1 10e3/uL   NEUT% 25.4 (*) 39.0 - 75.0 %   LYMPH% 71.9 (*) 14.0 - 49.0 %   MONO% 1.7  0.0 - 14.0 %   EOS% 0.9  0.0 - 7.0 %   BASO% 0.1  0.0 - 2.0 %  TECHNOLOGIST REVIEW     Status: None   Collection Time    02/12/13  8:45 AM      Result Value Range   Technologist Review Variant lymphs present    COMPREHENSIVE METABOLIC PANEL  (NW29)     Status: Abnormal   Collection Time    02/12/13  8:45 AM      Result Value Range   Sodium 141  136 - 145 mEq/L   Potassium 4.3  3.5 - 5.1 mEq/L   Chloride 109  98 - 109 mEq/L   CO2 21 (*) 22 - 29 mEq/L   Glucose 181 (*) 70 - 140 mg/dl   BUN 56.2  7.0 - 13.0 mg/dL   Creatinine 1.5 (*) 0.7 - 1.3 mg/dL   Total Bilirubin 8.65  0.20 - 1.20 mg/dL   Alkaline Phosphatase 91  40 - 150 U/L   AST 14  5 - 34 U/L   ALT 13  0 - 55 U/L   Total Protein 6.8  6.4 - 8.3 g/dL   Albumin 3.6  3.5 - 5.0 g/dL   Calcium 78.4  8.4 - 69.6 mg/dL   Anion Gap 11  3 - 11 mEq/L    Labs:  Lab Results  Component Value  Date   WBC 14.2* 02/12/2013   HGB 11.9* 02/12/2013   HCT 36.8* 02/12/2013   MCV 92.8 02/12/2013   PLT 210 02/12/2013   NEUTROABS 3.6 02/12/2013      Chemistry      Component Value Date/Time   NA 141 02/12/2013 0845   NA 140 01/01/2013 1538   K 4.3 02/12/2013 0845   K 4.2 01/01/2013 1538   CL 106 01/01/2013 1538   CL 109* 02/17/2012 1346   CO2 21* 02/12/2013 0845   CO2 24 01/01/2013 1538   BUN 22.2 02/12/2013 0845   BUN 23 01/01/2013 1538   CREATININE 1.5* 02/12/2013 0845   CREATININE 1.47* 01/01/2013 1538      Component Value Date/Time   CALCIUM 10.1 02/12/2013 0845   CALCIUM 9.7 01/01/2013 1538   ALKPHOS 91 02/12/2013 0845   ALKPHOS 90 07/23/2012 0817   AST 14 02/12/2013 0845   AST 45* 07/23/2012 0817   ALT 13 02/12/2013 0845   ALT 25 07/23/2012 0817   BILITOT 0.63 02/12/2013 0845   BILITOT 0.9 07/23/2012 0817     Basic Metabolic Panel:  Recent Labs Lab 02/12/13 0845  NA 141  K 4.3  CO2 21*  GLUCOSE 181*  BUN 22.2  CREATININE 1.5*  CALCIUM 10.1   GFR Estimated Creatinine Clearance: 53.7 ml/min (by C-G formula based on Cr of 1.5). Liver Function Tests:  Recent Labs Lab 02/12/13 0845  AST 14  ALT 13  ALKPHOS 91  BILITOT 0.63  PROT 6.8  ALBUMIN 3.6   CBC:  Recent Labs Lab 02/12/13 0845  WBC 14.2*  NEUTROABS 3.6  HGB 11.9*  HCT 36.8*  MCV 92.8  PLT  210   Microbiology Recent Results (from the past 240 hour(s))  TECHNOLOGIST REVIEW     Status: None   Collection Time    02/12/13  8:45 AM      Result Value Range Status   Technologist Review Variant lymphs present   Final    Studies:  No results found.   RADIOGRAPHIC STUDIES: No results found.  ASSESSMENT: Henry Utsey 79 y.o. male with a history of CLL - Plan: CBC with Differential, Comprehensive metabolic panel   PLAN:  1. CLL --Mr. Privitera is a 79 year old African American male with indolent stage 0, CD20 positive chronic lymphocytic leukemia, diagnosed in August 2000. Overall he has stable blood counts and he remain asymptomatic.  The patient will be scheduled for follow up in 12 months' time, at which time we will check CBC and chemistries.   All questions were answered. The patient knows to call the clinic with any problems, questions or concerns. We can certainly see the patient much sooner if necessary.  I spent 10 minutes counseling the patient face to face. The total time spent in the appointment was 15 minutes.    Colbey Wirtanen, MD 02/12/2013 4:57 PM

## 2013-02-12 NOTE — Patient Instructions (Signed)
Chronic Kidney Disease Chronic kidney disease occurs when the kidneys are damaged over a long period. The kidneys are two organs that lie on either side of the spine between the middle of the back and the front of the abdomen. The kidneys:   Remove wastes and extra water from the blood.   Produce important hormones. These help keep bones strong, regulate blood pressure, and help create red blood cells.   Balance the fluids and chemicals in the blood and tissues. A small amount of kidney damage may not cause problems, but a large amount of damage may make it difficult or impossible for the kidneys to work the way they should. If steps are not taken to slow down the kidney damage or stop it from getting worse, the kidneys may stop working permanently. Most of the time, chronic kidney disease does not go away. However, it can often be controlled, and those with the disease can usually live normal lives. CAUSES  The most common causes of chronic kidney disease are diabetes and high blood pressure (hypertension). Chronic kidney disease may also be caused by:   Diseases that cause kidneys' filters to become inflamed.   Diseases that affect the immune system.   Genetic diseases.   Medicines that damage the kidneys, such as anti-inflammatory medicines.  Poisoning or exposure to toxic substances.   A reoccurring kidney or urinary infection.   A problem with urine flow. This may be caused by:   Cancer.   Kidney stones.   An enlarged prostate in males. SYMPTOMS  Because the kidney damage in chronic kidney disease occurs slowly, symptoms develop slowly and may not be obvious until the kidney damage becomes severe. A person may have a kidney disease for years without showing any symptoms. Symptoms can include:   Swelling (edema) of the legs, ankles, or feet.   Tiredness (lethargy).   Nausea or vomiting.   Confusion.   Problems with urination, such as:   Decreased urine  production.   Frequent urination, especially at night.   Frequent accidents in children who are potty trained.   Muscle twitches and cramps.   Shortness of breath.  Weakness.   Persistent itchiness.   Loss of appetite.  Metallic taste in the mouth.  Trouble sleeping.  Slowed development in children.  Short stature in children. DIAGNOSIS  Chronic kidney disease may be detected and diagnosed by tests, including blood, urine, imaging, or kidney biopsy tests.  TREATMENT  Most chronic kidney diseases cannot be cured. Treatment usually involves relieving symptoms and preventing or slowing the progression of the disease. Treatment may include:   A special diet. You may need to avoid alcohol and foods thatare salty and high in potassium.   Medicines. These may:   Lower blood pressure.   Relieve anemia.   Relieve swelling.   Protect the bones. HOME CARE INSTRUCTIONS   Follow your prescribed diet.   Only take over-the-counter or prescription medicines as directed by your caregiver.  Do not take any new medicines (prescription, over-the-counter, or nutritional supplements) unless approved by your caregiver. Many medicines can worsen your kidney damage or need to have the dose adjusted.   Quit smoking if you are a smoker. Talk to your caregiver about a smoking cessation program.   Keep all follow-up appointments as directed by your caregiver. SEEK IMMEDIATE MEDICAL CARE IF:  Your symptoms get worse or you develop new symptoms.   You develop symptoms of end-stage kidney disease. These include:   Headaches.  Abnormally dark or light skin.   Numbness in the hands or feet.   Easy bruising.   Frequent hiccups.   Menstruation stops.   You have a fever.   You have decreased urine production.   You havepain or bleeding when urinating. MAKE SURE YOU:  Understand these instructions.  Will watch your condition.  Will get help right  away if you are not doing well or get worse. FOR MORE INFORMATION  American Association of Kidney Patients: ResidentialShow.is National Kidney Foundation: www.kidney.org American Kidney Fund: FightingMatch.com.ee Life Options Rehabilitation Program: www.lifeoptions.org and www.kidneyschool.org Document Released: 01/26/2008 Document Revised: 04/04/2012 Document Reviewed: 12/16/2011 [facility] Patient Information 2014 [place], [place]. Chronic Lymphocytic Leukemia Chronic lymphocytic leukemia (CLL) is a type of cancer of the bone marrow and blood cells. Bone marrow is the soft, spongy tissue inside your bone. In CLL, the bone marrow makes too many white blood cells that usually fight infection in the body (lymphocytes). CLL is the most common type of adult leukemia.  CAUSES  No one knows the exact cause of CLL. There is a higher risk of CLL in people who:   Are over 23 years of age.  Are white.  Are male.  Have a family history of CLL or other cancers of the lymph system.  Are of [place] or [place] European Jewish descent.  Have been exposed to certain chemicals, such as Agent Orange (used in the [place] War) or other herbicides or insecticides. SYMPTOMS  At first, some people do not have symptoms of chronic lymphocytic leukemia. After a while, people may notice some symptoms, such as:   Feeling more tired than usual, even after rest.  Unplanned weight loss.  Heavy sweating at night.  Fevers.  Shortness of breath.  Decreased energy.  Paleness.  Painless, swollen lymph nodes.  A feeling of fullness in the upper left part of the abdomen.  Easy bruising and/or bleeding.  More frequent infections. DIAGNOSIS   During a physical exam, your caregiver may notice an enlarged spleen, liver and/or lymph nodes.  Blood and bone marrow tests are performed to identify the presence of cancer cells.  A CT scan may be done to look for swelling or abnormalities in your spleen, liver, and  lymph nodes. TREATMENT  Treatment options for CLL depend on the stage and the presence of symptoms. There are a number of types of treatment used for this condition, including:  Targeted drugs. These are drugs that interfere with chemicals that leukemia cells need in order to grow and multiply.  Chemotherapy drugs. These medications kill cells that are multiplying quickly, such as leukemia cells.  Biological therapy. This treatment boosts the ability of the patient's own immune system to fight the leukemia cells.  Bone marrow or peripheral blood stem cell transplant. This treatment allows the patient to receive very high doses of chemotherapy and/or radiation. These high doses kill the cancer cells, but also destroy the bone marrow. After treatment is complete, the patient is given donor bone marrow or stem cells, which will replace the bone marrow. HOME CARE INSTRUCTIONS   Because you have an increased risk of infection, practice good hand washing and avoid being around people who are ill or crowded places.  Because you have an increased risk of bleeding and bruising, avoid contact sports or other rough activities.  Only take over-the-counter or prescription medicines for pain, discomfort or fever as directed by your caregiver.  Although some of your treatments might affect your appetite, try to eat  regular, healthy meals.  If you develop any side effects, such as nausea, diarrhea, rash, white patches in your mouth, a sore throat, difficulty swallowing, or severe fatigue, tell your caregiver. He or she may have recommendations of things you can do to improve symptoms.  Consider learning some ways to cope with the stress of having a chronic illness, such as yoga, meditation, or participating in a support group. SEEK IMMEDIATE MEDICAL CARE IF:  You develop an unexplained oral temperature of 102 F (38.9 C) or more.  You develop chest pains.  You develop a severe stiff neck or  headache.  You have trouble breathing or feel short of breath.  You feel very lightheaded or pass out.  You notice pain, swelling or redness anywhere in your legs.  You have pain in your belly (abdomen).  You develop new bruises that are getting bigger.  You have painful or more swollen lymph nodes.  You develop bleeding from your gums, nose, or in your urine or stools.  You are unable to stop throwing up (vomiting).  You cannot keep liquids down.  You feel depressed. Document Released: 09/04/2008 Document Revised: 07/11/2011 Document Reviewed: 09/04/2008 [facility] Patient Information 2014 [place], [place].

## 2013-03-19 LAB — HM DIABETES EYE EXAM

## 2013-04-05 ENCOUNTER — Ambulatory Visit (INDEPENDENT_AMBULATORY_CARE_PROVIDER_SITE_OTHER): Payer: Medicare Other | Admitting: Internal Medicine

## 2013-04-05 ENCOUNTER — Encounter: Payer: Self-pay | Admitting: Internal Medicine

## 2013-04-05 VITALS — BP 104/64 | HR 76 | Temp 98.2°F | Resp 16 | Ht 72.0 in | Wt 260.0 lb

## 2013-04-05 DIAGNOSIS — C9110 Chronic lymphocytic leukemia of B-cell type not having achieved remission: Secondary | ICD-10-CM

## 2013-04-05 DIAGNOSIS — I10 Essential (primary) hypertension: Secondary | ICD-10-CM

## 2013-04-05 DIAGNOSIS — E119 Type 2 diabetes mellitus without complications: Secondary | ICD-10-CM

## 2013-04-05 LAB — BASIC METABOLIC PANEL
BUN: 17 mg/dL (ref 6–23)
CO2: 28 mEq/L (ref 19–32)
Calcium: 9.5 mg/dL (ref 8.4–10.5)
Chloride: 109 mEq/L (ref 96–112)
Creatinine, Ser: 1.4 mg/dL (ref 0.4–1.5)
GFR: 63.38 mL/min (ref >60.00)
Glucose, Bld: 50 mg/dL — ABNORMAL LOW (ref 70–99)
Potassium: 4.5 mEq/L (ref 3.5–5.1)
Sodium: 143 mEq/L (ref 135–145)

## 2013-04-05 LAB — HEMOGLOBIN A1C: Hgb A1c MFr Bld: 5.9 % (ref 4.6–6.5)

## 2013-04-05 NOTE — Progress Notes (Signed)
Subjective:    Patient ID: Kevin Rogers, male    DOB: [birth year], 79 y.o.   MRN: [id number]  Diabetes Pertinent negatives for diabetes include no fatigue and no weakness.  Hypertension Pertinent negatives include no neck pain or shortness of breath.  great Blood pressure Stable CBG's Had surgery and is doing well in rehab      Review of Systems  Constitutional: Negative for fever and fatigue.  HENT: Negative for congestion, hearing loss and postnasal drip.   Eyes: Negative for discharge, redness and visual disturbance.  Respiratory: Negative for cough, shortness of breath and wheezing.   Cardiovascular: Negative for leg swelling.  Gastrointestinal: Negative for abdominal pain, constipation and abdominal distention.  Genitourinary: Negative for urgency and frequency.  Musculoskeletal: Negative for arthralgias, joint swelling and neck pain.  Skin: Negative for color change and rash.  Neurological: Negative for weakness and light-headedness.  Hematological: Negative for adenopathy.  Psychiatric/Behavioral: Negative for behavioral problems.   Past Medical History  Diagnosis Date  . Diabetes mellitus   . Hyperlipidemia   . Hypertension   . Leukemia, chronic 2001    does not take treatment just has blood levels checked once a year  . Glaucoma     left eye  . Gout     History   Social History  . Marital Status: Married    Spouse Name: N/A    Number of Children: N/A  . Years of Education: N/A   Occupational History  . Not on file.   Social History Main Topics  . Smoking status: Former Smoker    Types: Cigarettes    Quit date: 09/28/1995  . Smokeless tobacco: Never Used  . Alcohol Use: 0.6 oz/week    1 Glasses of wine per week     Comment: monthy  . Drug Use: No  . Sexual Activity: Yes   Other Topics Concern  . Not on file   Social History Narrative  . No narrative on file    Past Surgical History  Procedure Laterality Date  . Lumbar laminectomy   1997  . Hernia repair  1989  . Tonsillectomy    . Colonoscopy w/ polypectomy    . Dental surgery      implanted teeth x 2  . Anterior lat lumbar fusion Left 01/03/2013    Procedure: ANTERIOR LATERAL LUMBAR FUSION lumbar three-four;  Surgeon: Reinaldo Meeker, MD;  Location: [facility];  Service: Neurosurgery;  Laterality: Left;  . Lumbar percutaneous pedicle screw 1 level  01/03/2013    Procedure: LUMBAR PERCUTANEOUS PEDICLE SCREWS LUMBAR THREE-FOUR;  Surgeon: Reinaldo Meeker, MD;  Location: [facility];  Service: Neurosurgery;;    Family History  Problem Relation Age of Onset  . Heart disease Mother   . Heart disease Father     No Known Allergies  Current Outpatient Prescriptions on File Prior to Visit  Medication Sig Dispense Refill  . allopurinol (ZYLOPRIM) 300 MG tablet Take 300 mg by mouth daily.      . cyclobenzaprine (FLEXERIL) 10 MG tablet Take 1 tablet (10 mg total) by mouth 3 (three) times daily as needed for muscle spasms.  60 tablet  1  . glipiZIDE (GLUCOTROL XL) 5 MG 24 hr tablet Take 5 mg by mouth daily.      [name] HYDROcodone-acetaminophen (NORCO/VICODIN) 5-325 MG per tablet Take 1-2 tablets by mouth every 4 (four) hours as needed.  60 tablet  1  . ibuprofen (ADVIL,MOTRIN) 200 MG tablet Take 200 mg  by mouth every 6 (six) hours as needed for pain.      [name] losartan (COZAAR) 100 MG tablet Take 100 mg by mouth daily.      . meloxicam (MOBIC) 15 MG tablet Take 15 mg by mouth daily.      . tamsulosin (FLOMAX) 0.4 MG CAPS capsule Take 0.4 mg by mouth daily.      . travoprost, benzalkonium, (TRAVATAN) 0.004 % ophthalmic solution Place 1 drop into the left eye at bedtime.       . vitamin B-12 (CYANOCOBALAMIN) 1000 MCG tablet Take 1,000 mcg by mouth daily.       No current facility-administered medications on file prior to visit.    BP 104/64  Pulse 76  Temp(Src) 98.2 F (36.8 C)  Resp 16  Ht 6' (1.829 m)  Wt 260 lb (117.935 kg)  BMI 35.25 kg/m2       Objective:    Physical Exam  Constitutional: He appears well-developed and well-nourished.  HENT:  Head: Normocephalic and atraumatic.  Eyes: Conjunctivae are normal. Pupils are equal, round, and reactive to light.  Neck: Normal range of motion. Neck supple.  Cardiovascular: Normal rate and regular rhythm.   Murmur heard. Pulmonary/Chest: Effort normal and breath sounds normal.  Abdominal: Soft. Bowel sounds are normal.          Assessment & Plan:  Cut the cozaar to 1/2 and monitor moniter Bmet and a1c For DM Post op doing well

## 2013-04-05 NOTE — Patient Instructions (Addendum)
Cut the cozaar in 1/2 and check  Trial of the edex and if it works call back for rx

## 2013-04-05 NOTE — Progress Notes (Signed)
Pre visit review using our clinic review tool, if applicable. No additional management support is needed unless otherwise documented below in the visit note. 

## 2013-04-25 ENCOUNTER — Other Ambulatory Visit: Payer: Self-pay | Admitting: Internal Medicine

## 2013-07-16 ENCOUNTER — Telehealth: Payer: Self-pay | Admitting: Internal Medicine

## 2013-07-16 NOTE — Telephone Encounter (Signed)
[facility]AIL (MAIL ORDER) ELECTRON[facility] - [place], [place] requesting re-fills on:  tamsulosin (FLOMAX) 0.4 MG CAPS capsule losartan (COZAAR) 100 MG tablet

## 2013-07-17 MED ORDER — LOSARTAN POTASSIUM 100 MG PO TABS: 100.0000 mg | ORAL_TABLET | Freq: Every day | ORAL | Status: DC

## 2013-07-17 MED ORDER — TAMSULOSIN HCL 0.4 MG PO CAPS: 0.4000 mg | ORAL_CAPSULE | Freq: Every day | ORAL | Status: DC

## 2013-07-17 NOTE — Telephone Encounter (Signed)
rx sent in electronically 

## 2013-07-30 ENCOUNTER — Telehealth: Payer: Self-pay | Admitting: Internal Medicine

## 2013-07-30 NOTE — Telephone Encounter (Signed)
PRIMEMAIL (MAIL ORDER) ELECTRON[facility] - [place], [place] is requesting re-fills on the following: allopurinol (ZYLOPRIM) 300 MG tablet meloxicam (MOBIC) 15 MG tablet

## 2013-07-31 MED ORDER — ALLOPURINOL 300 MG PO TABS: 300.0000 mg | ORAL_TABLET | Freq: Every day | ORAL | Status: DC

## 2013-07-31 MED ORDER — MELOXICAM 15 MG PO TABS: 15.0000 mg | ORAL_TABLET | Freq: Every day | ORAL | Status: DC

## 2013-08-05 ENCOUNTER — Encounter: Payer: Self-pay | Admitting: Internal Medicine

## 2013-08-05 ENCOUNTER — Ambulatory Visit (INDEPENDENT_AMBULATORY_CARE_PROVIDER_SITE_OTHER): Payer: Medicare Other | Admitting: Internal Medicine

## 2013-08-05 VITALS — BP 140/80 | HR 63 | Temp 98.1°F | Ht 72.0 in | Wt 262.0 lb

## 2013-08-05 DIAGNOSIS — M109 Gout, unspecified: Secondary | ICD-10-CM

## 2013-08-05 DIAGNOSIS — N139 Obstructive and reflux uropathy, unspecified: Secondary | ICD-10-CM

## 2013-08-05 DIAGNOSIS — N401 Enlarged prostate with lower urinary tract symptoms: Secondary | ICD-10-CM

## 2013-08-05 DIAGNOSIS — E1165 Type 2 diabetes mellitus with hyperglycemia: Secondary | ICD-10-CM

## 2013-08-05 DIAGNOSIS — IMO0001 Reserved for inherently not codable concepts without codable children: Secondary | ICD-10-CM

## 2013-08-05 DIAGNOSIS — N138 Other obstructive and reflux uropathy: Secondary | ICD-10-CM

## 2013-08-05 DIAGNOSIS — E785 Hyperlipidemia, unspecified: Secondary | ICD-10-CM

## 2013-08-05 NOTE — Progress Notes (Signed)
   Subjective:    Patient ID: Kevin Rogers, male    DOB: 1931/09/01, 80 y.o.   MRN: [id number]  HPI CLL  REFERRED TO ORTHO AND THEN REFERRED TO NEUROSURGERY FOR BACK SURGERY IMPROVING  DUE ANNUAL EXAM Review of Systems  Constitutional: Negative for fever and fatigue.  HENT: Negative for congestion, hearing loss and postnasal drip.   Eyes: Negative for discharge, redness and visual disturbance.  Respiratory: Negative for cough, shortness of breath and wheezing.   Cardiovascular: Negative for leg swelling.  Gastrointestinal: Negative for abdominal pain, constipation and abdominal distention.  Genitourinary: Negative for urgency and frequency.  Musculoskeletal: Negative for arthralgias, joint swelling and neck pain.  Skin: Negative for color change and rash.  Neurological: Negative for weakness and light-headedness.  Hematological: Negative for adenopathy.  Psychiatric/Behavioral: Negative for behavioral problems.       Objective:   Physical Exam  Constitutional: He appears well-developed and well-nourished.  HENT:  Head: Normocephalic and atraumatic.  Eyes: Conjunctivae are normal. Pupils are equal, round, and reactive to light.  Neck: Normal range of motion. Neck supple.  Cardiovascular: Normal rate.   Pulmonary/Chest: Effort normal and breath sounds normal.  Abdominal: Soft. Bowel sounds are normal.          Assessment & Plan:  Stable hypertension on half of the Cozaar 50 mg by mouth daily.  Stable diabetes on Glucotrol 5 mg and diet.  Post neurosurgery with adequate pain control.  History of benign prostatic hypertrophy on Flomax history of mild glaucoma on eyedrops history of gout on alllopurinal for gout prophylaxis

## 2013-08-05 NOTE — Patient Instructions (Signed)
The patient is instructed to continue all medications as prescribed. Schedule followup with check out clerk upon leaving the clinic  

## 2013-08-05 NOTE — Progress Notes (Signed)
Pre visit review using our clinic review tool, if applicable. No additional management support is needed unless otherwise documented below in the visit note. 

## 2013-08-06 LAB — COMPREHENSIVE METABOLIC PANEL
ALT: 17 U/L (ref 0–53)
AST: 19 U/L (ref 0–37)
Albumin: 4.1 g/dL (ref 3.5–5.2)
Alkaline Phosphatase: 79 U/L (ref 39–117)
BUN: 17 mg/dL (ref 6–23)
CO2: 28 mEq/L (ref 19–32)
Calcium: 9.9 mg/dL (ref 8.4–10.5)
Chloride: 107 mEq/L (ref 96–112)
Creatinine, Ser: 1.4 mg/dL (ref 0.4–1.5)
GFR: 64.94 mL/min (ref >60.00)
Glucose, Bld: 69 mg/dL — ABNORMAL LOW (ref 70–99)
Potassium: 4.8 mEq/L (ref 3.5–5.1)
Sodium: 141 mEq/L (ref 135–145)
Total Bilirubin: 0.9 mg/dL (ref 0.3–1.2)
Total Protein: 6.6 g/dL (ref 6.0–8.3)

## 2013-08-06 LAB — LDL CHOLESTEROL, DIRECT: Direct LDL: 128.4 mg/dL

## 2013-08-06 LAB — PSA: PSA: 0.66 ng/mL (ref 0.10–4.00)

## 2013-08-06 LAB — URIC ACID: Uric Acid, Serum: 4.3 mg/dL (ref 4.0–7.8)

## 2013-08-06 LAB — HEMOGLOBIN A1C: Hgb A1c MFr Bld: 6.1 % (ref 4.6–6.5)

## 2013-08-19 ENCOUNTER — Telehealth: Payer: Self-pay

## 2013-08-19 NOTE — Telephone Encounter (Signed)
Relevant patient education assigned to patient using Emmi. ° °

## 2013-09-17 LAB — HM DIABETES EYE EXAM

## 2013-12-27 ENCOUNTER — Other Ambulatory Visit: Payer: Self-pay | Admitting: Neurosurgery

## 2013-12-27 DIAGNOSIS — M549 Dorsalgia, unspecified: Secondary | ICD-10-CM

## 2014-01-01 ENCOUNTER — Ambulatory Visit
Admission: RE | Admit: 2014-01-01 | Discharge: 2014-01-01 | Disposition: A | Discharge status: Home / Self Care | Payer: Medicare Other | Source: Ambulatory Visit | Attending: Neurosurgery | Admitting: Neurosurgery

## 2014-01-01 DIAGNOSIS — M549 Dorsalgia, unspecified: Secondary | ICD-10-CM

## 2014-01-01 MED ORDER — GADOBENATE DIMEGLUMINE 529 MG/ML IV SOLN
20.0000 mL | Freq: Once | INTRAVENOUS | Status: AC | PRN
  Administered 2014-01-01: 20 mL via INTRAVENOUS

## 2014-01-27 ENCOUNTER — Other Ambulatory Visit: Payer: Self-pay | Admitting: Internal Medicine

## 2014-01-29 ENCOUNTER — Other Ambulatory Visit: Payer: Self-pay | Admitting: Internal Medicine

## 2014-02-11 ENCOUNTER — Other Ambulatory Visit: Payer: Self-pay | Admitting: *Deleted

## 2014-02-11 DIAGNOSIS — C9110 Chronic lymphocytic leukemia of B-cell type not having achieved remission: Secondary | ICD-10-CM

## 2014-02-12 ENCOUNTER — Other Ambulatory Visit (HOSPITAL_BASED_OUTPATIENT_CLINIC_OR_DEPARTMENT_OTHER): Payer: Medicare Other

## 2014-02-12 ENCOUNTER — Telehealth: Payer: Self-pay | Admitting: Hematology

## 2014-02-12 ENCOUNTER — Ambulatory Visit (HOSPITAL_BASED_OUTPATIENT_CLINIC_OR_DEPARTMENT_OTHER): Payer: Medicare Other | Admitting: Hematology

## 2014-02-12 VITALS — BP 150/73 | HR 65 | Temp 98.1°F | Resp 21 | Ht 72.0 in | Wt 250.0 lb

## 2014-02-12 DIAGNOSIS — C9110 Chronic lymphocytic leukemia of B-cell type not having achieved remission: Secondary | ICD-10-CM

## 2014-02-12 DIAGNOSIS — M549 Dorsalgia, unspecified: Secondary | ICD-10-CM

## 2014-02-12 DIAGNOSIS — I10 Essential (primary) hypertension: Secondary | ICD-10-CM

## 2014-02-12 DIAGNOSIS — E119 Type 2 diabetes mellitus without complications: Secondary | ICD-10-CM

## 2014-02-12 DIAGNOSIS — Z23 Encounter for immunization: Secondary | ICD-10-CM

## 2014-02-12 LAB — CBC WITH DIFFERENTIAL/PLATELET
BASO%: 0.2 % (ref 0.0–2.0)
Basophils Absolute: 0 10*3/uL (ref 0.0–0.1)
EOS%: 0.5 % (ref 0.0–7.0)
Eosinophils Absolute: 0.1 10*3/uL (ref 0.0–0.5)
HCT: 41.1 % (ref 38.4–49.9)
HGB: 12.6 g/dL — ABNORMAL LOW (ref 13.0–17.1)
LYMPH%: 73.3 % — ABNORMAL HIGH (ref 14.0–49.0)
MCH: 29.3 pg (ref 27.2–33.4)
MCHC: 30.8 g/dL — ABNORMAL LOW (ref 32.0–36.0)
MCV: 95.3 fL (ref 79.3–98.0)
MONO#: 0.3 10*3/uL (ref 0.1–0.9)
MONO%: 1.5 % (ref 0.0–14.0)
NEUT#: 4.2 10*3/uL (ref 1.5–6.5)
NEUT%: 24.5 % — ABNORMAL LOW (ref 39.0–75.0)
Platelets: 193 10*3/uL (ref 140–400)
RBC: 4.31 10*6/uL (ref 4.20–5.82)
RDW: 14.2 % (ref 11.0–14.6)
WBC: 17.2 10*3/uL — ABNORMAL HIGH (ref 4.0–10.3)
lymph#: 12.6 10*3/uL — ABNORMAL HIGH (ref 0.9–3.3)

## 2014-02-12 LAB — COMPREHENSIVE METABOLIC PANEL (CC13)
ALT: 20 U/L (ref 0–55)
AST: 20 U/L (ref 5–34)
Albumin: 3.8 g/dL (ref 3.5–5.0)
Alkaline Phosphatase: 82 U/L (ref 40–150)
Anion Gap: 12 mEq/L — ABNORMAL HIGH (ref 3–11)
BUN: 19.5 mg/dL (ref 7.0–26.0)
CO2: 22 mEq/L (ref 22–29)
Calcium: 10 mg/dL (ref 8.4–10.4)
Chloride: 107 mEq/L (ref 98–109)
Creatinine: 1.6 mg/dL — ABNORMAL HIGH (ref 0.7–1.3)
Glucose: 205 mg/dl — ABNORMAL HIGH (ref 70–140)
Potassium: 4.6 mEq/L (ref 3.5–5.1)
Sodium: 141 mEq/L (ref 136–145)
Total Bilirubin: 0.99 mg/dL (ref 0.20–1.20)
Total Protein: 6.8 g/dL (ref 6.4–8.3)

## 2014-02-12 MED ORDER — PNEUMOCOCCAL VAC POLYVALENT 25 MCG/0.5ML IJ INJ: 0.5000 mL | INJECTION | INTRAMUSCULAR | Status: DC

## 2014-02-12 MED ORDER — INFLUENZA VAC SPLIT QUAD 0.5 ML IM SUSY
0.5000 mL | PREFILLED_SYRINGE | Freq: Once | INTRAMUSCULAR | Status: AC
  Administered 2014-02-12: 0.5 mL via INTRAMUSCULAR
  Filled 2014-02-12: qty 0.5

## 2014-02-12 MED ORDER — PNEUMOCOCCAL VAC POLYVALENT 25 MCG/0.5ML IJ INJ
0.5000 mL | INJECTION | Freq: Once | INTRAMUSCULAR | Status: AC
  Administered 2014-02-12: 0.5 mL via INTRAMUSCULAR
  Filled 2014-02-12: qty 0.5

## 2014-02-12 NOTE — Progress Notes (Signed)
[facility] ONCOL[facility] OFFICE PROGRESS NOTE DATE OF VISIT: 02/12/2014  Kevin Haber, MD [place] [address]  DIAGNOSIS: CLL (chronic lymphocytic leukemia) - Plan: CBC with Differential, Comprehensive metabolic panel (Cmet) - [facility], Lactate dehydrogenase (LDH) - [facility], Influenza vac split quadrivalent PF (FLUARIX) injection 0.5 mL, pneumococcal 23 valent vaccine (PNU-IMMUNE) injection 0.5 mL, DISCONTINUED: pneumococcal 23 valent vaccine (PNU-IMMUNE) injection 0.5 mL  Chief Complaint  Patient presents with  . Follow-up    CURRENT THERAPY: Observation only.   INTERVAL HISTORY:  Kevin Rogers 80 y.o. male with a history of CLL returns for followup. He has stage 0, CD20 positive for chronic lymphocytic leukemia. He was last seen by Dr Juliann Mule on 02/12/13. He reports since his last visit he's been doing overall pretty well. He did however have surgery last year for an L4/L5 fusion secondary to degenerative disc disease lumbar spinal stenosis that required a 2-day hospitalization from 09/04-09/06. During this hospitalization he denies any complications such as bleeding or infection. He he denies any emergency room visits or hospitalizations or any recent pneumonias or urinary tract infections. His last colonoscopy was done in 2008 with removal of polyps. His appetite is good his weight is stable. He reports to add at 10 pain primarily to the back aggravated with movement alleviated with immobility. He continued to take oral anti-glycemic medications for his diabetes. He denies any unusual bleeding/bruising, fever, chills, night sweats, dyspnea/cough, nausea, vomiting, constipation, diarrhea, abdominal pain, dysuria, urinary frequency or hematuria.  Patient continues to have back pain. He is been recently referred to a pain specialist and got an epidural injection about 2 weeks ago he said get a flu shot and a pneumonia vaccine in my office today. His blood pressure  is slightly elevated at 150/73. No history of recurrent infections or any other constitutional symptoms.  MEDICAL HISTORY: Past Medical History  Diagnosis Date  . Diabetes mellitus   . Hyperlipidemia   . Hypertension   . Leukemia, chronic 2001    does not take treatment just has blood levels checked once a year  . Glaucoma     left eye  . Gout     INTERIM HISTORY: has Chronic lymphocytic leukemia; DIABETES MELLITUS, TYPE II; B12 DEFICIENCY; GOUT; ERECTILE DYSFUNCTION; POLYNEUROPATHY IN DIABETES; GLAUCOMA; HYPERTENSION; ACUTE BRONCHITIS; OSTEOARTHRITIS; LOW BACK PAIN, ACUTE; Thoracic or lumbosacral neuritis or radiculitis, unspecified; and BACK PAIN on his problem list.    ALLERGIES:  has No Known Allergies.  MEDICATIONS: has a current medication list which includes the following prescription(s): allopurinol, cyclobenzaprine, glipizide, hydrocodone-acetaminophen, ibuprofen, losartan, meloxicam, tamsulosin, travoprost (benzalkonium), and vitamin b-12, and the following Facility-Administered Medications: influenza vac split quadrivalent pf and pneumococcal 23 valent vaccine.  SURGICAL HISTORY:  Past Surgical History  Procedure Laterality Date  . Lumbar laminectomy  1997  . Hernia repair  1989  . Tonsillectomy    . Colonoscopy w/ polypectomy    . Dental surgery      implanted teeth x 2  . Anterior lat lumbar fusion Left 01/03/2013    Procedure: ANTERIOR LATERAL LUMBAR FUSION lumbar three-four;  Surgeon: [name], MD;  Location: [facility];  Service: Neurosurgery;  Laterality: Left;  . Lumbar percutaneous pedicle screw 1 level  01/03/2013    Procedure: LUMBAR PERCUTANEOUS PEDICLE SCREWS LUMBAR THREE-FOUR;  Surgeon: [name], MD;  Location: [facility];  Service: Neurosurgery;;    REVIEW OF SYSTEMS:   Constitutional: Denies fevers, chills or abnormal weight loss Eyes: Denies  blurriness of vision Ears, nose, mouth, throat, and face: Denies mucositis or sore  throat Respiratory: Denies cough, dyspnea or wheezes Cardiovascular: Denies palpitation, chest discomfort or lower extremity swelling Gastrointestinal:  Denies nausea, heartburn or change in bowel habits Skin: Denies abnormal skin rashes Lymphatics: Denies new lymphadenopathy or easy bruising Neurological:Denies numbness, tingling or new weaknesses Behavioral/Psych: Mood is stable, no new changes  All other systems were reviewed with the patient and are negative.  PHYSICAL EXAMINATION: ECOG PERFORMANCE STATUS: 0-1  Blood pressure 150/73, pulse 65, temperature 98.1 F (36.7 C), temperature source Oral, resp. rate 21, height 6' (1.829 m), weight 250 lb (113.399 kg), SpO2 98.00%.  GENERAL:alert, no distress and comfortable; he is wearing  back brace  SKIN: skin color, texture, turgor are normal, no rashes or significant lesions EYES: normal, Conjunctiva are pink and non-injected, sclera clear OROPHARYNX:no exudate, no erythema and lips, buccal mucosa, and tongue normal  NECK: supple, thyroid normal size, non-tender, without nodularity LYMPH:  no palpable lymphadenopathy in the cervical, axillary or supraclavicular LUNGS: clear to auscultation and percussion with normal breathing effort HEART: regular rate & rhythm and no murmurs and no lower extremity edema ABDOMEN:abdomen soft, non-tender and normal bowel sounds Musculoskeletal:no cyanosis of digits and no clubbing  NEURO: alert & oriented x 3 with fluent speech, no focal motor/sensory deficits  LABORATORY DATA: Results for orders placed in visit on 02/12/14 (from the past 48 hour(s))  CBC WITH DIFFERENTIAL     Status: Abnormal   Collection Time    02/12/14  8:44 AM      Result Value Ref Range   WBC 17.2 (*) 4.0 - 10.3 10e3/uL   NEUT# 4.2  1.5 - 6.5 10e3/uL   HGB 12.6 (*) 13.0 - 17.1 g/dL   HCT 41.1  38.4 - 49.9 %   Platelets 193  140 - 400 10e3/uL   MCV 95.3  79.3 - 98.0 fL   MCH 29.3  27.2 - 33.4 pg   MCHC 30.8 (*) 32.0 -  36.0 g/dL   RBC 4.31  4.20 - 5.82 10e6/uL   RDW 14.2  11.0 - 14.6 %   lymph# 12.6 (*) 0.9 - 3.3 10e3/uL   MONO# 0.3  0.1 - 0.9 10e3/uL   Eosinophils Absolute 0.1  0.0 - 0.5 10e3/uL   Basophils Absolute 0.0  0.0 - 0.1 10e3/uL   NEUT% 24.5 (*) 39.0 - 75.0 %   LYMPH% 73.3 (*) 14.0 - 49.0 %   MONO% 1.5  0.0 - 14.0 %   EOS% 0.5  0.0 - 7.0 %   BASO% 0.2  0.0 - 2.0 %  COMPREHENSIVE METABOLIC PANEL (KY70)     Status: Abnormal   Collection Time    02/12/14  8:44 AM      Result Value Ref Range   Sodium 141  136 - 145 mEq/L   Potassium 4.6  3.5 - 5.1 mEq/L   Chloride 107  98 - 109 mEq/L   CO2 22  22 - 29 mEq/L   Glucose 205 (*) 70 - 140 mg/dl   BUN 19.5  7.0 - 26.0 mg/dL   Creatinine 1.6 (*) 0.7 - 1.3 mg/dL   Total Bilirubin 0.99  0.20 - 1.20 mg/dL   Alkaline Phosphatase 82  40 - 150 U/L   AST 20  5 - 34 U/L   ALT 20  0 - 55 U/L   Total Protein 6.8  6.4 - 8.3 g/dL   Albumin 3.8  3.5 - 5.0 g/dL  Calcium 10.0  8.4 - 10.4 mg/dL   Anion Gap 12 (*) 3 - 11 mEq/L      RADIOGRAPHIC STUDIES:  MRI LUMBAR SPINE W WO CONTRAST 01/01/2014  IMPRESSION:  1. Interval postoperative changes at L3-4 without residual stenosis.  2. Progressive disc and facet degeneration at L1-2 resulting in  moderate to severe spinal stenosis and bilateral neural foraminal  stenosis. Increased fluid signal in the L1-2 disc space and marrow  edema within the adjacent vertebral bodies and about the left L1-2  facet joint may be degenerative, however infection is also a  consideration.  3. Increased, moderate spinal stenosis at L2-3.  4. Mild spinal stenosis at T12-L1, stable to slightly increased from  prior.  These results will be called to the ordering clinician or  representative by the Radiologist Assistant, and communication  documented in the PACS or zVision Dashboard.  ASSESSMENT: Kevin Rogers 80 y.o. male with a history of CLL (chronic lymphocytic leukemia) - Plan: CBC with Differential, Comprehensive  metabolic panel (Cmet) - [facility], Lactate dehydrogenase (LDH) - [facility], Influenza vac split quadrivalent PF (FLUARIX) injection 0.5 mL, pneumococcal 23 valent vaccine (PNU-IMMUNE) injection 0.5 mL, DISCONTINUED: pneumococcal 23 valent vaccine (PNU-IMMUNE) injection 0.5 mL   PLAN:  1. CLL --Mr. Kudrna is a 80 year old African American male with indolent stage 0, CD20 positive chronic lymphocytic leukemia, diagnosed in August 2000. Overall he has stable blood counts and he remain asymptomatic.  The patient will be scheduled for follow up in 12 months' time, at which time we will check CBC, LDH and chemistries.   --He gets flu and pneumonia shot in my office today  All questions were answered. The patient knows to call the clinic with any problems, questions or concerns. We can certainly see the patient much sooner if necessary.  I spent 10 minutes counseling the patient face to face. The total time spent in the appointment was 15 minutes.    Bernadene Bell, MD Medical Hematologist/Oncologist [place] Pager: [phone] Office No: [phone]

## 2014-03-06 ENCOUNTER — Telehealth: Payer: Self-pay | Admitting: Internal Medicine

## 2014-03-06 MED ORDER — MELOXICAM 15 MG PO TABS: 15.0000 mg | ORAL_TABLET | Freq: Every day | ORAL | Status: DC

## 2014-03-06 MED ORDER — ALLOPURINOL 300 MG PO TABS: 300.0000 mg | ORAL_TABLET | Freq: Every day | ORAL | Status: DC

## 2014-03-06 NOTE — Telephone Encounter (Signed)
primemail called bc pt has only 5 days left on his meds. Pt has appt w/ hunter in Jan. Can we refill this?  Fax  [phone] or escribe

## 2014-03-06 NOTE — Telephone Encounter (Signed)
rx sent in electronically 

## 2014-03-06 NOTE — Telephone Encounter (Signed)
[facility]IM[facility]AIL (MAIL ORDER) ELECTRON[facility] - [place], [place] is requesting re-fills on meloxicam (MOBIC) 15 MG tablet and allopurinol (ZYLOPRIM) 300 MG tablet

## 2014-04-30 ENCOUNTER — Encounter: Payer: Self-pay | Admitting: Internal Medicine

## 2014-04-30 ENCOUNTER — Encounter: Payer: Self-pay | Admitting: Gastroenterology

## 2014-05-05 DIAGNOSIS — M4806 Spinal stenosis, lumbar region: Secondary | ICD-10-CM | POA: Diagnosis not present

## 2014-05-08 ENCOUNTER — Other Ambulatory Visit: Payer: Self-pay | Admitting: Neurosurgery

## 2014-05-08 DIAGNOSIS — M48061 Spinal stenosis, lumbar region without neurogenic claudication: Secondary | ICD-10-CM

## 2014-05-13 ENCOUNTER — Ambulatory Visit
Admission: RE | Admit: 2014-05-13 | Discharge: 2014-05-13 | Disposition: A | Discharge status: Home / Self Care | Payer: Medicare Other | Source: Ambulatory Visit | Attending: Neurosurgery | Admitting: Neurosurgery

## 2014-05-13 DIAGNOSIS — M4806 Spinal stenosis, lumbar region: Secondary | ICD-10-CM | POA: Diagnosis not present

## 2014-05-13 DIAGNOSIS — M5136 Other intervertebral disc degeneration, lumbar region: Secondary | ICD-10-CM | POA: Diagnosis not present

## 2014-05-13 DIAGNOSIS — M48061 Spinal stenosis, lumbar region without neurogenic claudication: Secondary | ICD-10-CM

## 2014-05-13 DIAGNOSIS — M545 Low back pain: Secondary | ICD-10-CM | POA: Diagnosis not present

## 2014-05-13 DIAGNOSIS — M5126 Other intervertebral disc displacement, lumbar region: Secondary | ICD-10-CM | POA: Diagnosis not present

## 2014-05-13 MED ORDER — IOHEXOL 180 MG/ML  SOLN
15.0000 mL | Freq: Once | INTRAMUSCULAR | Status: AC | PRN
  Administered 2014-05-13: 15 mL via INTRATHECAL

## 2014-05-13 MED ORDER — ONDANSETRON HCL 4 MG/2ML IJ SOLN: 4.0000 mg | Freq: Four times a day (QID) | INTRAMUSCULAR | Status: DC | PRN

## 2014-05-13 MED ORDER — MEPERIDINE HCL 100 MG/ML IJ SOLN
100.0000 mg | Freq: Once | INTRAMUSCULAR | Status: AC
  Administered 2014-05-13: 100 mg via INTRAMUSCULAR

## 2014-05-13 MED ORDER — ONDANSETRON HCL 4 MG/2ML IJ SOLN
4.0000 mg | Freq: Once | INTRAMUSCULAR | Status: AC
  Administered 2014-05-13: 4 mg via INTRAMUSCULAR

## 2014-05-13 MED ORDER — DIAZEPAM 5 MG PO TABS
10.0000 mg | ORAL_TABLET | Freq: Once | ORAL | Status: AC
  Administered 2014-05-13: 5 mg via ORAL

## 2014-05-13 NOTE — Discharge Instructions (Signed)

## 2014-05-16 ENCOUNTER — Telehealth: Payer: Self-pay | Admitting: Internal Medicine

## 2014-05-16 MED ORDER — TAMSULOSIN HCL 0.4 MG PO CAPS: 0.4000 mg | ORAL_CAPSULE | Freq: Every day | ORAL | Status: DC

## 2014-05-16 NOTE — Telephone Encounter (Signed)
Pt request refill tamsulosin (FLOMAX) 0.4 MG CAPS capsule 30 day Pt has new insurance and must get at a new mailorder.  Will not get in time and needs a 30 day to get him through. Pt has appt w/ dr hunter on 06/19/14. Can [facility] to [facility]/ [address]

## 2014-05-19 ENCOUNTER — Other Ambulatory Visit: Payer: Self-pay

## 2014-05-19 MED ORDER — MELOXICAM 15 MG PO TABS
15.0000 mg | ORAL_TABLET | Freq: Every day | ORAL | Status: DC
Start: 2014-05-19 — End: 2014-09-02

## 2014-05-19 NOTE — Telephone Encounter (Signed)
Rx request for Meloxicam 15 mg tablet  Pharm:  [facility]  Rx sent to pharmacy.

## 2014-05-21 ENCOUNTER — Other Ambulatory Visit: Payer: Self-pay | Admitting: *Deleted

## 2014-05-21 MED ORDER — ALLOPURINOL 300 MG PO TABS
300.0000 mg | ORAL_TABLET | Freq: Every day | ORAL | Status: DC
Start: 2014-05-21 — End: 2014-09-02

## 2014-05-21 MED ORDER — TAMSULOSIN HCL 0.4 MG PO CAPS: 0.4000 mg | ORAL_CAPSULE | Freq: Every day | ORAL | Status: DC

## 2014-05-21 MED ORDER — LOSARTAN POTASSIUM 100 MG PO TABS: 100.0000 mg | ORAL_TABLET | Freq: Every day | ORAL | Status: DC

## 2014-06-03 ENCOUNTER — Other Ambulatory Visit: Payer: Self-pay | Admitting: Neurosurgery

## 2014-06-03 DIAGNOSIS — Z6833 Body mass index (BMI) 33.0-33.9, adult: Secondary | ICD-10-CM | POA: Diagnosis not present

## 2014-06-03 DIAGNOSIS — M4806 Spinal stenosis, lumbar region: Secondary | ICD-10-CM | POA: Diagnosis not present

## 2014-06-09 ENCOUNTER — Other Ambulatory Visit: Payer: Self-pay | Admitting: Family Medicine

## 2014-06-18 ENCOUNTER — Encounter (HOSPITAL_COMMUNITY): Payer: Self-pay

## 2014-06-18 ENCOUNTER — Encounter (HOSPITAL_COMMUNITY)
Admission: RE | Admit: 2014-06-18 | Discharge: 2014-06-18 | Disposition: A | Discharge status: Home / Self Care | Payer: Medicare Other | Source: Ambulatory Visit | Attending: Obstetrics and Gynecology | Admitting: Obstetrics and Gynecology

## 2014-06-18 DIAGNOSIS — M4806 Spinal stenosis, lumbar region: Secondary | ICD-10-CM | POA: Insufficient documentation

## 2014-06-18 DIAGNOSIS — Z01818 Encounter for other preprocedural examination: Secondary | ICD-10-CM | POA: Insufficient documentation

## 2014-06-18 LAB — BASIC METABOLIC PANEL
Anion gap: 6 (ref 5–15)
BUN: 15 mg/dL (ref 6–23)
CO2: 22 mmol/L (ref 19–32)
Calcium: 9.6 mg/dL (ref 8.4–10.5)
Chloride: 110 mmol/L (ref 96–112)
Creatinine, Ser: 1.29 mg/dL (ref 0.50–1.35)
GFR calc Af Amer: 59 mL/min — ABNORMAL LOW (ref >90)
GFR calc non Af Amer: 51 mL/min — ABNORMAL LOW (ref >90)
Glucose, Bld: 63 mg/dL — ABNORMAL LOW (ref 70–99)
Potassium: 4.4 mmol/L (ref 3.5–5.1)
Sodium: 138 mmol/L (ref 135–145)

## 2014-06-18 LAB — TYPE AND SCREEN
ABO/RH(D): O POS
Antibody Screen: NEGATIVE

## 2014-06-18 LAB — CBC
HCT: 39.5 % (ref 39.0–52.0)
Hemoglobin: 13.3 g/dL (ref 13.0–17.0)
MCH: 30.8 pg (ref 26.0–34.0)
MCHC: 33.7 g/dL (ref 30.0–36.0)
MCV: 91.4 fL (ref 78.0–100.0)
Platelets: 165 10*3/uL (ref 150–400)
RBC: 4.32 MIL/uL (ref 4.22–5.81)
RDW: 13.7 % (ref 11.5–15.5)
WBC: 12.7 10*3/uL — ABNORMAL HIGH (ref 4.0–10.5)

## 2014-06-18 LAB — SURGICAL PCR SCREEN
MRSA, PCR: NEGATIVE
Staphylococcus aureus: NEGATIVE

## 2014-06-18 NOTE — Pre-Procedure Instructions (Signed)
Kevin Rogers  06/18/2014   Your procedure is scheduled on:  Thursday June 26, 2014 at 0730 AM  Report to [facility] Admitting at 0530 AM.  Call this number if you have problems the morning of surgery: [phone]   Remember:   Do not eat food or drink liquids after midnight Wednesday 06/25/14   Take these medicines the morning of surgery with A SIP OF WATER: Tylenol if needed and Meloxicam  Stop Aspirin, Ibuprofen, Vitamins and herbal meds 7 days prior to surgery.   Do not wear jewelry.  Do not wear lotions, powders, or colognes. You may wear deodorant.              Men may shave face and neck.  Do not bring valuables to the hospital.  [facility] is not responsible                  for any belongings or valuables.               Contacts, dentures or bridgework may not be worn into surgery.  Leave suitcase in the car. After surgery it may be brought to your room.  For patients admitted to the hospital, discharge time is determined by your                treatment team.               Patients discharged the day of surgery will not be allowed to drive  home.    Special Instructions: [place] - Preparing for Surgery  Before surgery, you can play an important role.  Because skin is not sterile, your skin needs to be as free of germs as possible.  You can reduce the number of germs on you skin by washing with CHG (chlorahexidine gluconate) soap before surgery.  CHG is an antiseptic cleaner which kills germs and bonds with the skin to continue killing germs even after washing.  Please DO NOT use if you have an allergy to CHG or antibacterial soaps.  If your skin becomes reddened/irritated stop using the CHG and inform your nurse when you arrive at Short Stay.  Do not shave (including legs and underarms) for at least 48 hours prior to the first CHG shower.  You may shave your face.  Please follow these instructions carefully:   1.  Shower with CHG Soap the night before  surgery and the                                morning of Surgery.  2.  If you choose to wash your hair, wash your hair first as usual with your       normal shampoo.  3.  After you shampoo, rinse your hair and body thoroughly to remove the                      Shampoo.  4.  Use CHG as you would any other liquid soap.  You can apply chg directly       to the skin and wash gently with scrungie or a clean washcloth.  5.  Apply the CHG Soap to your body ONLY FROM THE NECK DOWN.        Do not use on open wounds or open sores.  Avoid contact with your eyes,       ears, mouth and genitals (  private parts).  Wash genitals (private parts)       with your normal soap.  6.  Wash thoroughly, paying special attention to the area where your surgery        will be performed.  7.  Thoroughly rinse your body with warm water from the neck down.  8.  DO NOT shower/wash with your normal soap after using and rinsing off       the CHG Soap.  9.  Pat yourself dry with a clean towel.            10.  Wear clean pajamas.            11.  Place clean sheets on your bed the night of your first shower and do not        sleep with pets.  Day of Surgery  Do not apply any lotions/deoderants the morning of surgery.  Please wear clean clothes to the hospital/surgery center.      Please read over the following fact sheets that you were given: Pain Booklet, Coughing and Deep Breathing, Blood Transfusion Information, MRSA Information and Surgical Site Infection Prevention

## 2014-06-19 ENCOUNTER — Ambulatory Visit: Payer: Medicare Other | Admitting: Family Medicine

## 2014-06-20 ENCOUNTER — Telehealth: Payer: Self-pay | Admitting: Oncology

## 2014-06-20 NOTE — Telephone Encounter (Signed)
, °

## 2014-06-20 NOTE — Progress Notes (Signed)
Anesthesia Chart Review:  Pt is 81 year old male scheduled for posterior lumbar fusion (2 level) on 06/26/2014 with Dr. Hal Neer.   PMH includes: HTN, DM, hyperlipidemia, chronic leukemia, glaucoma. S/p anterior lateral lumbar fusion 01/03/2013. Former smoker. BMI 33.5  Meds include: losartan, tamsulosin, allopurinol, travatan ophthalmic.   Preoperative labs reviewed.    EKG: Left axis deviation. Poor R wave progression. Cannot rule out Anterior infarct, age undetermined. Compared to 08/25/1999, lateral T wave inversion no longer present. Confirmed by Dr. Nadyne Coombes.   Nuclear stress test 04/11/2011 (appears to have been done for abnormal EKG with lateral T wave abnormality): -Normal stress nuclear study. LVEF 65%  If no changes, I anticipate pt can proceed with surgery as scheduled.   Willeen Cass, FNP-BC [facility] Short Stay Surgical Center/Anesthesiology Phone: [phone] 06/20/2014 4:16 PM

## 2014-06-24 ENCOUNTER — Encounter: Payer: Self-pay | Admitting: Family Medicine

## 2014-06-24 ENCOUNTER — Ambulatory Visit (INDEPENDENT_AMBULATORY_CARE_PROVIDER_SITE_OTHER): Payer: Medicare Other | Admitting: Family Medicine

## 2014-06-24 DIAGNOSIS — E119 Type 2 diabetes mellitus without complications: Secondary | ICD-10-CM

## 2014-06-24 DIAGNOSIS — N400 Enlarged prostate without lower urinary tract symptoms: Secondary | ICD-10-CM | POA: Insufficient documentation

## 2014-06-24 DIAGNOSIS — M1009 Idiopathic gout, multiple sites: Secondary | ICD-10-CM

## 2014-06-24 DIAGNOSIS — I10 Essential (primary) hypertension: Secondary | ICD-10-CM | POA: Diagnosis not present

## 2014-06-24 DIAGNOSIS — C9110 Chronic lymphocytic leukemia of B-cell type not having achieved remission: Secondary | ICD-10-CM | POA: Diagnosis not present

## 2014-06-24 LAB — COMPREHENSIVE METABOLIC PANEL
ALT: 14 U/L (ref 0–53)
AST: 17 U/L (ref 0–37)
Albumin: 4.2 g/dL (ref 3.5–5.2)
Alkaline Phosphatase: 78 U/L (ref 39–117)
BUN: 23 mg/dL (ref 6–23)
CO2: 28 mEq/L (ref 19–32)
Calcium: 9.8 mg/dL (ref 8.4–10.5)
Chloride: 109 mEq/L (ref 96–112)
Creatinine, Ser: 1.33 mg/dL (ref 0.40–1.50)
GFR: 66.48 mL/min (ref >60.00)
Glucose, Bld: 114 mg/dL — ABNORMAL HIGH (ref 70–99)
Potassium: 4.7 mEq/L (ref 3.5–5.1)
Sodium: 139 mEq/L (ref 135–145)
Total Bilirubin: 1.1 mg/dL (ref 0.2–1.2)
Total Protein: 6.7 g/dL (ref 6.0–8.3)

## 2014-06-24 LAB — HEMOGLOBIN A1C: Hgb A1c MFr Bld: 5.9 % (ref 4.6–6.5)

## 2014-06-24 NOTE — Assessment & Plan Note (Signed)
Controlled with last uric acid 4.3. Continue allopurinol.

## 2014-06-24 NOTE — Assessment & Plan Note (Signed)
Check a1c today. Discussed if ever had hypoglycemia would want to stop glipizide. A1c goal really would be around 7.5 with age and last # at 71.1.

## 2014-06-24 NOTE — Assessment & Plan Note (Signed)
Controlled on losartan 50mg  alone

## 2014-06-24 NOTE — Patient Instructions (Addendum)
Great to meet you  Labs today including a1c and kidney function.   I am concerned about the meloxicam potentially with your kidneys, need to keep a close eye and I may tell you to stop.   I think we should check in at least every 6 months but happy to see you sooner if needed/desired

## 2014-06-24 NOTE — Progress Notes (Signed)
Kevin Reddish, MD Phone: [phone]  Subjective:  Patient presents today to establish care with me as their new primary care provider. Patient was formerly a patient of Dr. [name]. Chief complaint-noted.   Diabetes II Historically well controlled on glipizide 5mg  alone 24 hour. Last a1c was 6.1. No foot exam within a year.  ROS- denies hypoglycemia or changes in vision  Gout Well controlled on allopurinol with no recent flares and uric acid <6 within a year ROS- no hot swollen joints  Hypertension-controlled on losartan 50mg   BP Readings from Last 3 Encounters:  06/24/14 110/70  05/13/14 127/72  02/12/14 150/73  Compliant with medications-yes without side effects ROS-Denies any CP, HA, SOB, blurry vision, LE edema.   The following were reviewed and entered/updated in epic: Past Medical History  Diagnosis Date  . Diabetes mellitus   . Hyperlipidemia   . Hypertension   . Leukemia, chronic 2001    does not take treatment just has blood levels checked once a year  . Glaucoma     left eye  . Gout   . Acute bronchitis 09/12/2007    Qualifier: Diagnosis of  By: [name] MD, [name]    Patient Active Problem List   Diagnosis Date Noted  . Chronic lymphocytic leukemia 03/08/2007    Priority: High  . Diabetes mellitus type II, controlled 03/08/2007    Priority: High  . BPH (benign prostatic hyperplasia) 06/24/2014    Priority: Medium  . Gout 03/08/2007    Priority: Medium  . Essential hypertension 03/08/2007    Priority: Medium  . Low back pain 03/08/2007    Priority: Medium  . B12 deficiency 12/12/2008    Priority: Low  . Diabetic polyneuropathy 12/12/2008    Priority: Low  . ERECTILE DYSFUNCTION 03/18/2008    Priority: Low  . Glaucoma 03/08/2007    Priority: Low  . Osteoarthritis 03/08/2007    Priority: Low   Past Surgical History  Procedure Laterality Date  . Lumbar laminectomy  1997  . Hernia repair  1989  . Tonsillectomy    . Colonoscopy w/ polypectomy     . Dental surgery      implanted teeth x 2  . Anterior lat lumbar fusion Left 01/03/2013    Procedure: ANTERIOR LATERAL LUMBAR FUSION lumbar three-four;  Surgeon: [name], MD;  Location: [facility];  Service: Neurosurgery;  Laterality: Left;  . Lumbar percutaneous pedicle screw 1 level  01/03/2013    Procedure: LUMBAR PERCUTANEOUS PEDICLE SCREWS LUMBAR THREE-FOUR;  Surgeon: [name], MD;  Location: [facility];  Service: Neurosurgery;;    Family History  Problem Relation Age of Onset  . Heart disease Mother     80, second hand  . Heart disease Father     50, former smoker    Medications- reviewed and updated Current Outpatient Prescriptions  Medication Sig Dispense Refill  . allopurinol (ZYLOPRIM) 300 MG tablet Take 1 tablet (300 mg total) by mouth daily. (Patient taking differently: Take 300 mg by mouth at bedtime. ) 90 tablet 0  . glipiZIDE (GLUCOTROL XL) 5 MG 24 hr tablet TAKE 1 TABLET EVERY DAY 90 tablet 1  . losartan (COZAAR) 100 MG tablet Take 1 tablet (100 mg total) by mouth daily. (Patient taking differently: Take 50 mg by mouth daily. ) 90 tablet 0  . meloxicam (MOBIC) 15 MG tablet Take 1 tablet (15 mg total) by mouth daily. 90 tablet 0  . Misc Natural Products (GLUCOS-CHONDROIT-MSM COMPLEX) TABS Take 1 tablet  by mouth 2 (two) times daily.    . tamsulosin (FLOMAX) 0.4 MG CAPS capsule Take 1 capsule (0.4 mg total) by mouth daily. (Patient taking differently: Take 0.4 mg by mouth at bedtime. ) 90 capsule 0  . travoprost, benzalkonium, (TRAVATAN) 0.004 % ophthalmic solution Place 1 drop into the left eye at bedtime.     . vitamin B-12 (CYANOCOBALAMIN) 1000 MCG tablet Take 1,000 mcg by mouth daily.    [name] acetaminophen (TYLENOL) 500 MG tablet Take 500-1,000 mg by mouth every 6 (six) hours as needed (pain).     Allergies-reviewed and updated No Known Allergies  History   Social History  . Marital Status: Widowed    Spouse Name: N/A  . Number of Children: N/A  .  Years of Education: N/A   Social History Main Topics  . Smoking status: Former Smoker -- 0.25 packs/day for 20 years    Types: Cigarettes    Quit date: 09/28/1995  . Smokeless tobacco: Never Used  . Alcohol Use: 0.6 oz/week    1 Glasses of wine per week     Comment: monthy  . Drug Use: No  . Sexual Activity: Yes   Other Topics Concern  . None   Social History Narrative   Widowed 2009. 2 sons. 2 grandkids-boy/girl. No greatgrandkids.    Single but dating-same person for a while, may get married again   Lives alone. Does everything for himself except cutting grass if back is hurting.       Retired from [employer]: golf (hard with back), watch tv (pain with moving around)    ROS--See HPI   Objective: BP 110/70 mmHg  Temp(Src) 98 F (36.7 C)  Wt 252 lb (114.306 kg) Gen: NAD, resting comfortably in chair, walks with 4 pt cane HEENT: Mucous membranes are moist. Oropharynx normal. Reasonable dentition for age.  CV: RRR no murmurs rubs or gallops Lungs: CTAB no crackles, wheeze, rhonchi Abdomen: soft/nontender/nondistended/normal bowel sounds.  Ext: no edema Skin: warm, dry, no rash Neuro: grossly normal, moves all extremities, PERRLA  Diabetic Foot Exam - Simple   Simple Foot Form  Diabetic Foot exam was performed with the following findings:  Yes 06/24/2014 11:08 AM  Visual Inspection  No deformities, no ulcerations, no other skin breakdown bilaterally:  Yes  Sensation Testing  Intact to touch and monofilament testing bilaterally:  Yes  Pulse Check  Posterior Tibialis and Dorsalis pulse intact bilaterally:  Yes  Comments    likely onychomychosis 1st toe bilaterally   Assessment/Plan:  Diabetes mellitus type II, controlled Check a1c today. Discussed if ever had hypoglycemia would want to stop glipizide. A1c goal really would be around 7.5 with age and last # at 6.1.    Gout Controlled with last uric acid 4.3. Continue allopurinol.    Essential  hypertension Controlled on losartan 50mg  alone    Return precautions advised. 6 month follow up. Discussed with patient would consider stopping meloxicam if any worsening beyond GFR below 55. Could consider tramadol for arthritis and low back pain  Orders Placed This Encounter  Procedures  . Hemoglobin A1c    [place]  . Comprehensive metabolic panel    [place]

## 2014-06-25 MED ORDER — CEFAZOLIN SODIUM-DEXTROSE 2-3 GM-% IV SOLR
2.0000 g | INTRAVENOUS | Status: AC
  Administered 2014-06-26: 2 g via INTRAVENOUS
  Filled 2014-06-25: qty 50

## 2014-06-25 MED ORDER — DEXAMETHASONE SODIUM PHOSPHATE 10 MG/ML IJ SOLN
10.0000 mg | INTRAMUSCULAR | Status: AC
  Administered 2014-06-26 (×3): 10 mg via INTRAVENOUS
  Filled 2014-06-25: qty 1

## 2014-06-26 ENCOUNTER — Encounter (HOSPITAL_COMMUNITY): Payer: Self-pay | Admitting: *Deleted

## 2014-06-26 ENCOUNTER — Inpatient Hospital Stay (HOSPITAL_COMMUNITY): Payer: Medicare Other | Admitting: Anesthesiology

## 2014-06-26 ENCOUNTER — Inpatient Hospital Stay (HOSPITAL_COMMUNITY): Payer: Medicare Other | Admitting: Emergency Medicine

## 2014-06-26 ENCOUNTER — Inpatient Hospital Stay (HOSPITAL_COMMUNITY): Payer: Medicare Other

## 2014-06-26 ENCOUNTER — Inpatient Hospital Stay (HOSPITAL_COMMUNITY)
Admission: RE | Admit: 2014-06-26 | Discharge: 2014-06-28 | DRG: 460 | Disposition: A | Discharge status: Home / Self Care | Payer: Medicare Other | Source: Ambulatory Visit | Attending: Neurosurgery | Admitting: Neurosurgery

## 2014-06-26 ENCOUNTER — Encounter (HOSPITAL_COMMUNITY)
Admission: RE | Disposition: A | Discharge status: Home / Self Care | Payer: Self-pay | Source: Ambulatory Visit | Attending: Neurosurgery

## 2014-06-26 DIAGNOSIS — I10 Essential (primary) hypertension: Secondary | ICD-10-CM | POA: Diagnosis present

## 2014-06-26 DIAGNOSIS — Z981 Arthrodesis status: Secondary | ICD-10-CM

## 2014-06-26 DIAGNOSIS — M4806 Spinal stenosis, lumbar region: Principal | ICD-10-CM | POA: Diagnosis present

## 2014-06-26 DIAGNOSIS — M549 Dorsalgia, unspecified: Secondary | ICD-10-CM | POA: Diagnosis present

## 2014-06-26 DIAGNOSIS — M109 Gout, unspecified: Secondary | ICD-10-CM | POA: Diagnosis present

## 2014-06-26 DIAGNOSIS — Z791 Long term (current) use of non-steroidal anti-inflammatories (NSAID): Secondary | ICD-10-CM

## 2014-06-26 DIAGNOSIS — Z87891 Personal history of nicotine dependence: Secondary | ICD-10-CM

## 2014-06-26 DIAGNOSIS — M48061 Spinal stenosis, lumbar region without neurogenic claudication: Secondary | ICD-10-CM

## 2014-06-26 DIAGNOSIS — E785 Hyperlipidemia, unspecified: Secondary | ICD-10-CM | POA: Diagnosis present

## 2014-06-26 DIAGNOSIS — H409 Unspecified glaucoma: Secondary | ICD-10-CM | POA: Diagnosis not present

## 2014-06-26 DIAGNOSIS — E119 Type 2 diabetes mellitus without complications: Secondary | ICD-10-CM | POA: Diagnosis present

## 2014-06-26 DIAGNOSIS — M47817 Spondylosis without myelopathy or radiculopathy, lumbosacral region: Secondary | ICD-10-CM | POA: Diagnosis not present

## 2014-06-26 LAB — GLUCOSE, CAPILLARY
Glucose-Capillary: 123 mg/dL — ABNORMAL HIGH (ref 70–99)
Glucose-Capillary: 156 mg/dL — ABNORMAL HIGH (ref 70–99)
Glucose-Capillary: 140 mg/dL — ABNORMAL HIGH (ref 70–99)
Glucose-Capillary: 121 mg/dL — ABNORMAL HIGH (ref 70–99)

## 2014-06-26 SURGERY — POSTERIOR LUMBAR FUSION 2 LEVEL
Anesthesia: General | Site: Back

## 2014-06-26 MED ORDER — INSULIN ASPART 100 UNIT/ML ~~LOC~~ SOLN: 0.0000 [IU] | Freq: Every day | SUBCUTANEOUS | Status: DC

## 2014-06-26 MED ORDER — HYDROMORPHONE HCL 1 MG/ML IJ SOLN
1.0000 mg | INTRAMUSCULAR | Status: DC | PRN
  Administered 2014-06-26: 1 mg via INTRAMUSCULAR
  Filled 2014-06-26: qty 1

## 2014-06-26 MED ORDER — SODIUM CHLORIDE 0.9 % IV SOLN: 250.0000 mL | INTRAVENOUS | Status: DC

## 2014-06-26 MED ORDER — HYDROMORPHONE HCL 1 MG/ML IJ SOLN
0.2500 mg | INTRAMUSCULAR | Status: DC | PRN
  Administered 2014-06-26: 0.5 mg via INTRAVENOUS
  Administered 2014-06-26 (×3): 0.25 mg via INTRAVENOUS
  Administered 2014-06-26: 0.5 mg via INTRAVENOUS
  Administered 2014-06-26: 0.25 mg via INTRAVENOUS

## 2014-06-26 MED ORDER — DOCUSATE SODIUM 100 MG PO CAPS
100.0000 mg | ORAL_CAPSULE | Freq: Two times a day (BID) | ORAL | Status: DC
  Administered 2014-06-26 – 2014-06-28 (×4): 100 mg via ORAL
  Filled 2014-06-26 (×4): qty 1

## 2014-06-26 MED ORDER — TRAVOPROST (BAK FREE) 0.004 % OP SOLN
1.0000 [drp] | Freq: Every day | OPHTHALMIC | Status: DC
  Administered 2014-06-26 – 2014-06-27 (×2): 1 [drp] via OPHTHALMIC
  Filled 2014-06-26: qty 2.5

## 2014-06-26 MED ORDER — ONDANSETRON HCL 4 MG/2ML IJ SOLN
INTRAMUSCULAR | Status: AC
  Filled 2014-06-26: qty 2

## 2014-06-26 MED ORDER — WHITE PETROLATUM GEL
Status: AC
  Administered 2014-06-26: 0.2
  Filled 2014-06-26: qty 1

## 2014-06-26 MED ORDER — POTASSIUM CHLORIDE IN NACL 20-0.45 MEQ/L-% IV SOLN
INTRAVENOUS | Status: DC
  Administered 2014-06-27 (×2): via INTRAVENOUS
  Filled 2014-06-26 (×6): qty 1000

## 2014-06-26 MED ORDER — FENTANYL CITRATE 0.05 MG/ML IJ SOLN
INTRAMUSCULAR | Status: DC | PRN
  Administered 2014-06-26 (×2): 25 ug via INTRAVENOUS
  Administered 2014-06-26: 50 ug via INTRAVENOUS
  Administered 2014-06-26: 100 ug via INTRAVENOUS
  Administered 2014-06-26: 50 ug via INTRAVENOUS
  Administered 2014-06-26: 25 ug via INTRAVENOUS

## 2014-06-26 MED ORDER — ESMOLOL HCL 10 MG/ML IV SOLN
INTRAVENOUS | Status: DC | PRN
  Administered 2014-06-26: 10 mg via INTRAVENOUS
  Administered 2014-06-26: 20 mg via INTRAVENOUS

## 2014-06-26 MED ORDER — ACETAMINOPHEN 325 MG PO TABS
650.0000 mg | ORAL_TABLET | ORAL | Status: DC | PRN
  Administered 2014-06-27: 650 mg via ORAL
  Filled 2014-06-26: qty 2

## 2014-06-26 MED ORDER — SODIUM CHLORIDE 0.9 % IJ SOLN
3.0000 mL | Freq: Two times a day (BID) | INTRAMUSCULAR | Status: DC
  Administered 2014-06-27: 3 mL via INTRAVENOUS

## 2014-06-26 MED ORDER — FENTANYL CITRATE 0.05 MG/ML IJ SOLN
INTRAMUSCULAR | Status: AC
  Filled 2014-06-26: qty 5

## 2014-06-26 MED ORDER — MENTHOL 3 MG MT LOZG: 1.0000 | LOZENGE | OROMUCOSAL | Status: DC | PRN

## 2014-06-26 MED ORDER — METHOCARBAMOL 1000 MG/10ML IJ SOLN
500.0000 mg | Freq: Four times a day (QID) | INTRAVENOUS | Status: DC | PRN
  Administered 2014-06-26: 500 mg via INTRAVENOUS
  Filled 2014-06-26 (×2): qty 5

## 2014-06-26 MED ORDER — OXYCODONE HCL 5 MG/5ML PO SOLN: 5.0000 mg | Freq: Once | ORAL | Status: DC | PRN

## 2014-06-26 MED ORDER — VECURONIUM BROMIDE 10 MG IV SOLR
INTRAVENOUS | Status: DC | PRN
  Administered 2014-06-26 (×3): 2 mg via INTRAVENOUS
  Administered 2014-06-26: 4 mg via INTRAVENOUS

## 2014-06-26 MED ORDER — SODIUM CHLORIDE 0.9 % IJ SOLN: 3.0000 mL | INTRAMUSCULAR | Status: DC | PRN

## 2014-06-26 MED ORDER — GLIPIZIDE ER 5 MG PO TB24
5.0000 mg | ORAL_TABLET | Freq: Every day | ORAL | Status: DC
  Administered 2014-06-27 – 2014-06-28 (×2): 5 mg via ORAL
  Filled 2014-06-26 (×3): qty 1

## 2014-06-26 MED ORDER — GLYCOPYRROLATE 0.2 MG/ML IJ SOLN
INTRAMUSCULAR | Status: DC | PRN
  Administered 2014-06-26: .4 mg via INTRAVENOUS
  Administered 2014-06-26: 1 mg via INTRAVENOUS

## 2014-06-26 MED ORDER — LIDOCAINE HCL (CARDIAC) 20 MG/ML IV SOLN
INTRAVENOUS | Status: AC
  Filled 2014-06-26: qty 5

## 2014-06-26 MED ORDER — ACETAMINOPHEN 650 MG RE SUPP: 650.0000 mg | RECTAL | Status: DC | PRN

## 2014-06-26 MED ORDER — LACTATED RINGERS IV SOLN
INTRAVENOUS | Status: DC | PRN
  Administered 2014-06-26 (×3): via INTRAVENOUS

## 2014-06-26 MED ORDER — ROCURONIUM BROMIDE 50 MG/5ML IV SOLN
INTRAVENOUS | Status: AC
  Filled 2014-06-26: qty 1

## 2014-06-26 MED ORDER — HYDROMORPHONE HCL 1 MG/ML IJ SOLN: 0.2500 mg | INTRAMUSCULAR | Status: DC | PRN

## 2014-06-26 MED ORDER — PANTOPRAZOLE SODIUM 40 MG IV SOLR
40.0000 mg | Freq: Every day | INTRAVENOUS | Status: DC
  Administered 2014-06-26: 40 mg via INTRAVENOUS
  Filled 2014-06-26: qty 40

## 2014-06-26 MED ORDER — NEOSTIGMINE METHYLSULFATE 10 MG/10ML IV SOLN
INTRAVENOUS | Status: DC | PRN
  Administered 2014-06-26: 5 mg via INTRAVENOUS

## 2014-06-26 MED ORDER — OXYCODONE HCL 5 MG PO TABS: 5.0000 mg | ORAL_TABLET | Freq: Once | ORAL | Status: DC | PRN

## 2014-06-26 MED ORDER — SODIUM CHLORIDE 0.9 % IV SOLN
10.0000 mg | INTRAVENOUS | Status: DC | PRN
  Administered 2014-06-26: 25 ug/min via INTRAVENOUS

## 2014-06-26 MED ORDER — CEFAZOLIN SODIUM-DEXTROSE 2-3 GM-% IV SOLR
2.0000 g | Freq: Three times a day (TID) | INTRAVENOUS | Status: AC
  Administered 2014-06-26 – 2014-06-27 (×2): 2 g via INTRAVENOUS
  Filled 2014-06-26 (×2): qty 50

## 2014-06-26 MED ORDER — THROMBIN 20000 UNITS EX SOLR
CUTANEOUS | Status: DC | PRN
  Administered 2014-06-26 (×2): via TOPICAL

## 2014-06-26 MED ORDER — VECURONIUM BROMIDE 10 MG IV SOLR
INTRAVENOUS | Status: AC
  Filled 2014-06-26: qty 10

## 2014-06-26 MED ORDER — LIDOCAINE HCL (CARDIAC) 20 MG/ML IV SOLN
INTRAVENOUS | Status: DC | PRN
  Administered 2014-06-26: 60 mg via INTRAVENOUS

## 2014-06-26 MED ORDER — ARTIFICIAL TEARS OP OINT
TOPICAL_OINTMENT | OPHTHALMIC | Status: DC | PRN
  Administered 2014-06-26: 1 via OPHTHALMIC

## 2014-06-26 MED ORDER — OXYCODONE-ACETAMINOPHEN 5-325 MG PO TABS
1.0000 | ORAL_TABLET | ORAL | Status: DC | PRN
  Administered 2014-06-26 (×2): 2 via ORAL
  Administered 2014-06-27: 1 via ORAL
  Administered 2014-06-27 – 2014-06-28 (×4): 2 via ORAL
  Filled 2014-06-26 (×2): qty 2
  Filled 2014-06-26: qty 1
  Filled 2014-06-26 (×5): qty 2

## 2014-06-26 MED ORDER — EPHEDRINE SULFATE 50 MG/ML IJ SOLN
INTRAMUSCULAR | Status: AC
  Filled 2014-06-26: qty 1

## 2014-06-26 MED ORDER — 0.9 % SODIUM CHLORIDE (POUR BTL) OPTIME
TOPICAL | Status: DC | PRN
  Administered 2014-06-26: 1000 mL

## 2014-06-26 MED ORDER — PROMETHAZINE HCL 25 MG/ML IJ SOLN
6.2500 mg | INTRAMUSCULAR | Status: DC | PRN
Start: 2014-06-26 — End: 2014-06-26

## 2014-06-26 MED ORDER — STERILE WATER FOR INJECTION IJ SOLN
INTRAMUSCULAR | Status: AC
  Filled 2014-06-26: qty 10

## 2014-06-26 MED ORDER — SODIUM CHLORIDE 0.9 % IJ SOLN
INTRAMUSCULAR | Status: AC
  Filled 2014-06-26: qty 10

## 2014-06-26 MED ORDER — ONDANSETRON HCL 4 MG/2ML IJ SOLN
INTRAMUSCULAR | Status: DC | PRN
  Administered 2014-06-26: 4 mg via INTRAVENOUS

## 2014-06-26 MED ORDER — ARTIFICIAL TEARS OP OINT
TOPICAL_OINTMENT | OPHTHALMIC | Status: AC
  Filled 2014-06-26: qty 3.5

## 2014-06-26 MED ORDER — LOSARTAN POTASSIUM 50 MG PO TABS
50.0000 mg | ORAL_TABLET | Freq: Every day | ORAL | Status: DC
  Administered 2014-06-26: 50 mg via ORAL
  Filled 2014-06-26 (×2): qty 1

## 2014-06-26 MED ORDER — HYDROMORPHONE HCL 1 MG/ML IJ SOLN
INTRAMUSCULAR | Status: AC
Start: 2014-06-26 — End: 2014-06-27
  Filled 2014-06-26: qty 1

## 2014-06-26 MED ORDER — ALBUMIN HUMAN 5 % IV SOLN
INTRAVENOUS | Status: DC | PRN
  Administered 2014-06-26: 10:00:00 via INTRAVENOUS

## 2014-06-26 MED ORDER — TRAVOPROST (BAK FREE) 0.004 % OP SOLN
1.0000 [drp] | Freq: Every day | OPHTHALMIC | Status: DC
  Filled 2014-06-26: qty 2.5

## 2014-06-26 MED ORDER — EPHEDRINE SULFATE 50 MG/ML IJ SOLN
INTRAMUSCULAR | Status: DC | PRN
  Administered 2014-06-26: 80 mg via INTRAVENOUS
  Administered 2014-06-26: 10 mg via INTRAVENOUS

## 2014-06-26 MED ORDER — HYDROMORPHONE HCL 1 MG/ML IJ SOLN
INTRAMUSCULAR | Status: AC
  Filled 2014-06-26: qty 1

## 2014-06-26 MED ORDER — PHENOL 1.4 % MT LIQD: 1.0000 | OROMUCOSAL | Status: DC | PRN

## 2014-06-26 MED ORDER — INSULIN ASPART 100 UNIT/ML ~~LOC~~ SOLN
4.0000 [IU] | Freq: Three times a day (TID) | SUBCUTANEOUS | Status: DC
  Administered 2014-06-26 – 2014-06-27 (×4): 4 [IU] via SUBCUTANEOUS

## 2014-06-26 MED ORDER — NEOSTIGMINE METHYLSULFATE 10 MG/10ML IV SOLN
INTRAVENOUS | Status: AC
  Filled 2014-06-26: qty 1

## 2014-06-26 MED ORDER — TAMSULOSIN HCL 0.4 MG PO CAPS
0.4000 mg | ORAL_CAPSULE | Freq: Every day | ORAL | Status: DC
  Administered 2014-06-26 – 2014-06-27 (×2): 0.4 mg via ORAL
  Filled 2014-06-26 (×2): qty 1

## 2014-06-26 MED ORDER — ONDANSETRON HCL 4 MG/2ML IJ SOLN
4.0000 mg | INTRAMUSCULAR | Status: DC | PRN
Start: 2014-06-26 — End: 2014-06-28

## 2014-06-26 MED ORDER — BUPIVACAINE HCL (PF) 0.5 % IJ SOLN
INTRAMUSCULAR | Status: DC | PRN
  Administered 2014-06-26: 10 mL

## 2014-06-26 MED ORDER — GLYCOPYRROLATE 0.2 MG/ML IJ SOLN
INTRAMUSCULAR | Status: AC
  Filled 2014-06-26: qty 6

## 2014-06-26 MED ORDER — SODIUM CHLORIDE 0.9 % IR SOLN
Status: DC | PRN
  Administered 2014-06-26: 07:00:00

## 2014-06-26 MED ORDER — ROCURONIUM BROMIDE 100 MG/10ML IV SOLN
INTRAVENOUS | Status: DC | PRN
  Administered 2014-06-26 (×2): 10 mg via INTRAVENOUS
  Administered 2014-06-26: 20 mg via INTRAVENOUS
  Administered 2014-06-26: 50 mg via INTRAVENOUS

## 2014-06-26 MED ORDER — ALLOPURINOL 100 MG PO TABS
300.0000 mg | ORAL_TABLET | Freq: Every day | ORAL | Status: DC
  Administered 2014-06-26 – 2014-06-28 (×3): 300 mg via ORAL
  Filled 2014-06-26 (×4): qty 3

## 2014-06-26 MED ORDER — SUCCINYLCHOLINE CHLORIDE 20 MG/ML IJ SOLN
INTRAMUSCULAR | Status: AC
  Filled 2014-06-26: qty 1

## 2014-06-26 MED ORDER — ALUM & MAG HYDROXIDE-SIMETH 200-200-20 MG/5ML PO SUSP: 30.0000 mL | Freq: Four times a day (QID) | ORAL | Status: DC | PRN

## 2014-06-26 MED ORDER — INSULIN ASPART 100 UNIT/ML ~~LOC~~ SOLN
0.0000 [IU] | Freq: Three times a day (TID) | SUBCUTANEOUS | Status: DC
  Administered 2014-06-26 – 2014-06-28 (×4): 2 [IU] via SUBCUTANEOUS

## 2014-06-26 MED ORDER — PROPOFOL 10 MG/ML IV BOLUS
INTRAVENOUS | Status: AC
  Filled 2014-06-26: qty 20

## 2014-06-26 MED ORDER — TRAVOPROST 0.004 % OP SOLN: 1.0000 [drp] | Freq: Every day | OPHTHALMIC | Status: DC

## 2014-06-26 MED ORDER — METHOCARBAMOL 500 MG PO TABS
500.0000 mg | ORAL_TABLET | Freq: Four times a day (QID) | ORAL | Status: DC | PRN
  Administered 2014-06-27 – 2014-06-28 (×2): 500 mg via ORAL
  Filled 2014-06-26 (×3): qty 1

## 2014-06-26 MED ORDER — PROPOFOL 10 MG/ML IV BOLUS
INTRAVENOUS | Status: DC | PRN
  Administered 2014-06-26: 150 mg via INTRAVENOUS

## 2014-06-26 SURGICAL SUPPLY — 70 items
APL SKNCLS STERI-STRIP NONHPOA (GAUZE/BANDAGES/DRESSINGS) ×2
BAG DECANTER FOR FLEXI CONT (MISCELLANEOUS) ×3 IMPLANT
BENZOIN TINCTURE PRP APPL 2/3 (GAUZE/BANDAGES/DRESSINGS) ×6 IMPLANT
BLADE CLIPPER SURG (BLADE) IMPLANT
BONE EQUIVA 10CC (Bone Implant) ×3 IMPLANT
BONE EQUIVA 5CC (Bone Implant) ×3 IMPLANT
BRUSH SCRUB EZ PLAIN DRY (MISCELLANEOUS) ×3 IMPLANT
BUR CUTTER 7.0 ROUND (BURR) ×9 IMPLANT
BUR MATCHSTICK NEURO 3.0 LAGG (BURR) ×3 IMPLANT
CANISTER SUCT 3000ML PPV (MISCELLANEOUS) ×3 IMPLANT
CEMENT CAL PHOSPHATE 5CC (Cement) ×3 IMPLANT
CLOSURE WOUND 1/2 X4 (GAUZE/BANDAGES/DRESSINGS) ×1
CONT SPEC 4OZ CLIKSEAL STRL BL (MISCELLANEOUS) ×6 IMPLANT
COVER BACK TABLE 60X90IN (DRAPES) ×3 IMPLANT
DRAPE C-ARM 42X72 X-RAY (DRAPES) ×3 IMPLANT
DRAPE C-ARMOR (DRAPES) ×3 IMPLANT
DRAPE LAPAROTOMY 100X72X124 (DRAPES) ×3 IMPLANT
DRAPE SURG 17X23 STRL (DRAPES) ×6 IMPLANT
DRSG OPSITE 4X5.5 SM (GAUZE/BANDAGES/DRESSINGS) ×3 IMPLANT
DRSG OPSITE POSTOP 4X6 (GAUZE/BANDAGES/DRESSINGS) ×3 IMPLANT
DRSG TELFA 3X8 NADH (GAUZE/BANDAGES/DRESSINGS) ×3 IMPLANT
DURAPREP 26ML APPLICATOR (WOUND CARE) ×3 IMPLANT
ELECT REM PT RETURN 9FT ADLT (ELECTROSURGICAL) ×3
ELECTRODE REM PT RTRN 9FT ADLT (ELECTROSURGICAL) ×1 IMPLANT
EVACUATOR 1/8 PVC DRAIN (DRAIN) ×3 IMPLANT
GAUZE SPONGE 4X4 12PLY STRL (GAUZE/BANDAGES/DRESSINGS) ×3 IMPLANT
GAUZE SPONGE 4X4 16PLY XRAY LF (GAUZE/BANDAGES/DRESSINGS) IMPLANT
GLOVE BIOGEL PI IND STRL 7.0 (GLOVE) ×3 IMPLANT
GLOVE BIOGEL PI INDICATOR 7.0 (GLOVE) ×6
GLOVE ECLIPSE 6.5 STRL STRAW (GLOVE) ×9 IMPLANT
GLOVE ECLIPSE 7.5 STRL STRAW (GLOVE) ×6 IMPLANT
GLOVE ECLIPSE 8.0 STRL XLNG CF (GLOVE) ×6 IMPLANT
GLOVE INDICATOR 7.5 STRL GRN (GLOVE) ×3 IMPLANT
GLOVE SURG SS PI 7.0 STRL IVOR (GLOVE) ×6 IMPLANT
GOWN STRL REUS W/ TWL LRG LVL3 (GOWN DISPOSABLE) ×4 IMPLANT
GOWN STRL REUS W/ TWL XL LVL3 (GOWN DISPOSABLE) ×1 IMPLANT
GOWN STRL REUS W/TWL 2XL LVL3 (GOWN DISPOSABLE) IMPLANT
GOWN STRL REUS W/TWL LRG LVL3 (GOWN DISPOSABLE) ×12
GOWN STRL REUS W/TWL XL LVL3 (GOWN DISPOSABLE) ×3
IMPLANT ARDIS PEEK 10X9X26 (Orthopedic Implant) ×6 IMPLANT
KIT BASIN OR (CUSTOM PROCEDURE TRAY) ×3 IMPLANT
KIT ROOM TURNOVER OR (KITS) ×3 IMPLANT
LIQUID BAND (GAUZE/BANDAGES/DRESSINGS) ×3 IMPLANT
NEEDLE HYPO 22GX1.5 SAFETY (NEEDLE) ×3 IMPLANT
NS IRRIG 1000ML POUR BTL (IV SOLUTION) ×3 IMPLANT
PACK LAMINECTOMY NEURO (CUSTOM PROCEDURE TRAY) ×3 IMPLANT
PAD ARMBOARD 7.5X6 YLW CONV (MISCELLANEOUS) ×9 IMPLANT
PATTIES SURGICAL .75X.75 (GAUZE/BANDAGES/DRESSINGS) ×3 IMPLANT
PEDIGUARD CURV (INSTRUMENTS) ×3 IMPLANT
PEEK OPTIMA 9X9X26MM (Peek) ×6 IMPLANT
ROD PERBENT 80MM (Rod) ×3 IMPLANT
ROD PREBENT 75MM (Rod) ×3 IMPLANT
SCREW MIN INVASIVE 6.5X45 (Screw) ×12 IMPLANT
SCREW PATHFINDER 7.5X50 (Screw) ×3 IMPLANT
SPONGE LAP 4X18 X RAY DECT (DISPOSABLE) IMPLANT
SPONGE SURGIFOAM ABS GEL 100 (HEMOSTASIS) ×6 IMPLANT
STRIP CLOSURE SKIN 1/2X4 (GAUZE/BANDAGES/DRESSINGS) ×2 IMPLANT
SUT PROLENE 0 CT 1 30 (SUTURE) IMPLANT
SUT VIC AB 0 CT1 18XCR BRD8 (SUTURE) ×1 IMPLANT
SUT VIC AB 0 CT1 8-18 (SUTURE) ×3
SUT VIC AB 2-0 OS6 18 (SUTURE) ×15 IMPLANT
SUT VIC AB 3-0 CP2 18 (SUTURE) ×6 IMPLANT
SYR 20ML ECCENTRIC (SYRINGE) ×3 IMPLANT
TOP CLSR SEQUOIA (Orthopedic Implant) ×18 IMPLANT
TOWEL OR 17X24 6PK STRL BLUE (TOWEL DISPOSABLE) ×3 IMPLANT
TOWEL OR 17X26 10 PK STRL BLUE (TOWEL DISPOSABLE) ×3 IMPLANT
TRAP SPECIMEN MUCOUS 40CC (MISCELLANEOUS) ×3 IMPLANT
TRAY FOLEY CATH 14FRSI W/METER (CATHETERS) IMPLANT
TRAY FOLEY CATH 16FRSI W/METER (SET/KITS/TRAYS/PACK) ×3 IMPLANT
WATER STERILE IRR 1000ML POUR (IV SOLUTION) ×3 IMPLANT

## 2014-06-26 NOTE — Anesthesia Preprocedure Evaluation (Addendum)
Anesthesia Evaluation  Patient identified by MRN, date of birth, ID band Patient awake    Reviewed: Allergy & Precautions, H&P , NPO status , Patient's Chart, lab work & pertinent test results  History of Anesthesia Complications Negative for: history of anesthetic complications  Airway Mallampati: I  TM Distance: >3 FB Neck ROM: full    Dental  (+) Teeth Intact, Caps, Dental Advidsory Given   Pulmonary former smoker,  breath sounds clear to auscultation        Cardiovascular hypertension, Rhythm:Regular Rate:Normal     Neuro/Psych    GI/Hepatic negative GI ROS, Neg liver ROS,   Endo/Other  diabetes, Well Controlled  Renal/GU      Musculoskeletal  (+) Arthritis -,   Abdominal (+) + obese,   Peds  Hematology   Anesthesia Other Findings   Reproductive/Obstetrics                           Anesthesia Physical Anesthesia Plan  ASA: II  Anesthesia Plan: General   Post-op Pain Management:    Induction: Intravenous  Airway Management Planned: Oral ETT  Additional Equipment:   Intra-op Plan:   Post-operative Plan: Extubation in OR  Informed Consent: I have reviewed the patients History and Physical, chart, labs and discussed the procedure including the risks, benefits and alternatives for the proposed anesthesia with the patient or authorized representative who has indicated his/her understanding and acceptance.   Dental Advisory Given  Plan Discussed with: CRNA, Anesthesiologist and Surgeon  Anesthesia Plan Comments:        Anesthesia Quick Evaluation

## 2014-06-26 NOTE — Anesthesia Postprocedure Evaluation (Signed)
  Anesthesia Post-op Note  Patient: Kevin Rogers  Procedure(s) Performed: Procedure(s): POSTERIOR LUMBAR INTERBODY FUSION LUMBAR ONE-TWO ,LUMBAR TWO -THREE  (N/A)  Patient Location: PACU  Anesthesia Type:General  Level of Consciousness: awake and alert   Airway and Oxygen Therapy: Patient Spontanous Breathing  Post-op Pain: mild  Post-op Assessment: Post-op Vital signs reviewed  Post-op Vital Signs: stable  Last Vitals:  Filed Vitals:   06/26/14 1415  BP:   Pulse: 76  Temp:   Resp: 14    Complications: No apparent anesthesia complications

## 2014-06-26 NOTE — Transfer of Care (Signed)
Immediate Anesthesia Transfer of Care Note  Patient: Kevin Rogers  Procedure(s) Performed: Procedure(s): POSTERIOR LUMBAR INTERBODY FUSION LUMBAR ONE-TWO ,LUMBAR TWO -THREE  (N/A)  Patient Location: PACU  Anesthesia Type:General  Level of Consciousness: sedated  Airway & Oxygen Therapy: Patient Spontanous Breathing and Patient connected to nasal cannula oxygen  Post-op Assessment: Report given to RN, Post -op Vital signs reviewed and stable and Patient moving all extremities X 4  Post vital signs: Reviewed and stable  Last Vitals:  Filed Vitals:   06/26/14 0548  BP: 123/85  Pulse: 68  Temp: 36.3 C  Resp: 20    Complications: No apparent anesthesia complications

## 2014-06-26 NOTE — Anesthesia Procedure Notes (Signed)
Procedure Name: Intubation Date/Time: 06/26/2014 7:35 AM Performed by: Neldon Newport Pre-anesthesia Checklist: Patient being monitored, Suction available, Emergency Drugs available, Patient identified and Timeout performed Patient Re-evaluated:Patient Re-evaluated prior to inductionOxygen Delivery Method: Circle system utilized Preoxygenation: Pre-oxygenation with 100% oxygen Intubation Type: IV induction Ventilation: Mask ventilation without difficulty Laryngoscope Size: Mac and 4 Grade View: Grade II Tube type: Oral Tube size: 7.5 mm Number of attempts: 1 Placement Confirmation: positive ETCO2,  ETT inserted through vocal cords under direct vision and breath sounds checked- equal and bilateral Secured at: 23 cm Tube secured with: Tape Dental Injury: Teeth and Oropharynx as per pre-operative assessment

## 2014-06-26 NOTE — H&P (Signed)
Kevin Rogers is an 81 y.o. male.   Chief Complaint: Back pain into the legs HPI: The patient is an 81 year old gentleman who was originally seen a few years ago and underwent a lateral lumbar fusion at L3-4. Did extremely well but over the last year so he has developed recurrent pain going into his legs which has been getting progressively worse. 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 After discussing the options and because of the severe difficulty with ambulation the patient requested surgery now comes for extension of his fusion L1-2 and L2-3. I've had a long discussion with him regarding the risks and benefits of surgical intervention. The risks discussed include but are not limited to bleeding infection weakness numbness paralysis spinal fluid leak trouble with instrumentation nonunion coma and death. We have discussed alternative methods of therapy offered risks and benefits of nonintervention. He's had the opportunity to ask numerous questions and appears to understand. With this information in hand he has requested we proceed with surgery.  Past Medical History  Diagnosis Date  . Diabetes mellitus   . Hyperlipidemia   . Hypertension   . Leukemia, chronic 2001    does not take treatment just has blood levels checked once a year  . Glaucoma     left eye  . Gout   . Acute bronchitis 09/12/2007    Qualifier: Diagnosis of  By: [name] MD, [name]     Past Surgical History  Procedure Laterality Date  . Lumbar laminectomy  1997  . Hernia repair  1989  . Tonsillectomy    . Colonoscopy w/ polypectomy    . Dental surgery      implanted teeth x 2  . Anterior lat lumbar fusion Left 01/03/2013    Procedure: ANTERIOR LATERAL LUMBAR FUSION lumbar three-four;  Surgeon: [name], MD;  Location: [facility];  Service: Neurosurgery;  Laterality: Left;  .  Lumbar percutaneous pedicle screw 1 level  01/03/2013    Procedure: LUMBAR PERCUTANEOUS PEDICLE SCREWS LUMBAR THREE-FOUR;  Surgeon: [name], MD;  Location: [facility];  Service: Neurosurgery;;    Family History  Problem Relation Age of Onset  . Heart disease Mother     41, second hand  . Heart disease Father     58, former smoker   Social History:  reports that he quit smoking about 18 years ago. His smoking use included Cigarettes. He has a 5 pack-year smoking history. He has never used smokeless tobacco. He reports that he drinks about 0.6 oz of alcohol per week. He reports that he does not use illicit drugs.  Allergies: No Known Allergies  Medications Prior to Admission  Medication Sig Dispense Refill  . allopurinol (ZYLOPRIM) 300 MG tablet Take 1 tablet (300 mg total) by mouth daily. (Patient taking differently: Take 300 mg by mouth at bedtime. ) 90 tablet 0  . glipiZIDE (GLUCOTROL XL) 5 MG 24 hr tablet TAKE 1 TABLET EVERY DAY 90 tablet 1  . losartan (COZAAR) 100 MG tablet Take 1 tablet (100 mg total) by mouth daily. (Patient taking differently: Take 50 mg by mouth daily. ) 90 tablet 0  . meloxicam (MOBIC) 15 MG tablet Take 1 tablet (15 mg total) by mouth daily. 90 tablet 0  . Misc Natural Products (GLUCOS-CHONDROIT-MSM COMPLEX) TABS Take 1 tablet by mouth 2 (two) times daily.    . tamsulosin (FLOMAX)  0.4 MG CAPS capsule Take 1 capsule (0.4 mg total) by mouth daily. (Patient taking differently: Take 0.4 mg by mouth at bedtime. ) 90 capsule 0  . travoprost, benzalkonium, (TRAVATAN) 0.004 % ophthalmic solution Place 1 drop into the left eye at bedtime.     . vitamin B-12 (CYANOCOBALAMIN) 1000 MCG tablet Take 1,000 mcg by mouth daily.    [name] acetaminophen (TYLENOL) 500 MG tablet Take 500-1,000 mg by mouth every 6 (six) hours as needed (pain).      Results for orders placed or performed during the hospital encounter of 06/26/14 (from the past 48 hour(s))  Glucose, capillary      Status: Abnormal   Collection Time: 06/26/14  5:57 AM  Result Value Ref Range   Glucose-Capillary 123 (H) 70 - 99 mg/dL   No results found.  Positive for arthritis and the previously mentioned back issues  Blood pressure 123/85, pulse 68, temperature 97.3 F (36.3 C), temperature source Oral, resp. rate 20, height 6' (1.829 m), weight 114.306 kg (252 lb), SpO2 98 %.  The patient is awake or and oriented. His gait is slow and rather antalgic. His reflexes are decreased but equal. 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 The plan is for an L1-L2 3 decompression with interbody fusion and screw fixation  [name], MD 06/26/2014, 7:20 AM

## 2014-06-26 NOTE — Progress Notes (Signed)
Patient arrived from PACU accompanied by family. Safety precautions and orders reviewed with patient. Patient denied pain at this time. Report to oncoming nurse.   Ave Filter, RN

## 2014-06-26 NOTE — Progress Notes (Signed)
Utilization review completed.  

## 2014-06-27 LAB — GLUCOSE, CAPILLARY
Glucose-Capillary: 136 mg/dL — ABNORMAL HIGH (ref 70–99)
Glucose-Capillary: 137 mg/dL — ABNORMAL HIGH (ref 70–99)
Glucose-Capillary: 70 mg/dL (ref 70–99)
Glucose-Capillary: 130 mg/dL — ABNORMAL HIGH (ref 70–99)

## 2014-06-27 MED ORDER — PANTOPRAZOLE SODIUM 40 MG PO TBEC
40.0000 mg | DELAYED_RELEASE_TABLET | Freq: Every day | ORAL | Status: DC
  Administered 2014-06-27 – 2014-06-28 (×2): 40 mg via ORAL
  Filled 2014-06-27 (×2): qty 1

## 2014-06-27 MED ORDER — SENNA 8.6 MG PO TABS
1.0000 | ORAL_TABLET | Freq: Every day | ORAL | Status: DC
  Administered 2014-06-27: 8.6 mg via ORAL
  Filled 2014-06-27: qty 1

## 2014-06-27 NOTE — Progress Notes (Signed)
PAtient is DTV at 1230. HE denied the urge to void and requested to wait until 1400. Rn bladder scanned pt at 1400 of greater than 250cc in bladder. In&Out cath performed and removed 400cc urine. Patient is due to void again at 2000. IVF infusing. Encourage oral fluids. Will continue to monitor.   Ave Filter, RN

## 2014-06-27 NOTE — Evaluation (Signed)
Occupational Therapy Evaluation Patient Details Name: Kevin Rogers MRN: [id number] DOB: 05-Dec-1931 Today's Date: 06/27/2014    History of Present Illness Patient is an 81 y.o. male with PMH of L3-4 lateral fusion 2014, HTN, HLD, DM and chronic leukemia now s/o L1-2, L2-3 PLIF due to stenosis.   Clinical Impression   Pt admitted with above. He demonstrates the below listed deficits and will benefit from continued OT to maximize safety and independence with BADLs.  Pt requires min A for BADLs.  Family is very supportive, and able to provide assist as needed at discharge.  Will follow acutely       Follow Up Recommendations  No OT follow up;Supervision/Assistance - 24 hour    Equipment Recommendations  3 in 1 bedside comode    Recommendations for Other Services       Precautions / Restrictions Precautions Precautions: Back Precaution Booklet Issued: No Precaution Comments: reviewed precautions verbally Required Braces or Orthoses: Spinal Brace Spinal Brace: Lumbar corset;Applied in sitting position      Mobility Bed Mobility Overal bed mobility: Needs Assistance Bed Mobility: Rolling;Sidelying to Sit Rolling: Supervision Sidelying to sit: Supervision       General bed mobility comments: Pt required min verbal cues for log rolling   Transfers Overall transfer level: Needs assistance Equipment used: Rolling walker (2 wheeled) Transfers: Stand Pivot Transfers;Sit to/from Stand Sit to Stand: Min guard Stand pivot transfers: Min guard       General transfer comment: cues for walker safety     Balance Overall balance assessment: Needs assistance Sitting-balance support: Feet supported Sitting balance-Leahy Scale: Good     Standing balance support: Bilateral upper extremity supported Standing balance-Leahy Scale: Poor                              ADL Overall ADL's : Needs assistance/impaired Eating/Feeding: Independent   Grooming: Wash/dry  hands;Wash/dry face;Oral care;Brushing hair;Supervision/safety;Standing   Upper Body Bathing: Set up;Sitting   Lower Body Bathing: Minimal assistance;Sit to/from stand   Upper Body Dressing : Set up;Sitting   Lower Body Dressing: Minimal assistance;Sit to/from stand   Toilet Transfer: Ambulation;Min guard   Toileting- Water quality scientist and Hygiene: Min guard;Sit to/from stand       Functional mobility during ADLs: Min guard;Rolling walker General ADL Comments: Pt able to cross ankles over knees.  requires min A to don socks, supervision to [employer]     Praxis      Pertinent Vitals/Pain Pain Assessment: 0-10 Pain Score: 5  Pain Location: back  Pain Descriptors / Indicators: Aching Pain Intervention(s): Monitored during session     Hand Dominance     Extremity/Trunk Assessment Upper Extremity Assessment Upper Extremity Assessment: Overall WFL for tasks assessed   Lower Extremity Assessment Lower Extremity Assessment: Defer to PT evaluation   Cervical / Trunk Assessment Cervical / Trunk Assessment: Normal   Communication Communication Communication: No difficulties   Cognition Arousal/Alertness: Awake/alert Behavior During Therapy: WFL for tasks assessed/performed Overall Cognitive Status: Within Functional Limits for tasks assessed                     General Comments       Exercises       Shoulder Instructions      Home Living Family/patient expects to be discharged to:: Private residence Living Arrangements: Spouse/significant other Available Help at Discharge: Friend(s);Available 24 hours/day Type  of Home: House Home Access: Stairs to enter [employer] of Steps: 2 Entrance Stairs-Rails: Left Home Layout: Two level;Able to live on main level with bedroom/bathroom     Bathroom Shower/Tub: Occupational psychologist: Standard     Home Equipment: Environmental consultant - 2 wheels;Cane - single point           Prior Functioning/Environment Level of Independence: Independent with assistive device(s)             OT Diagnosis: Generalized weakness;Acute pain   OT Problem List: Decreased strength;Decreased activity tolerance;Decreased safety awareness;Decreased knowledge of use of DME or AE;Decreased knowledge of precautions;Pain   OT Treatment/Interventions: Self-care/ADL training;DME and/or AE instruction;Therapeutic activities;Patient/family education;Balance training    OT Goals(Current goals can be found in the care plan section) Acute Rehab OT Goals Patient Stated Goal: To return home OT Goal Formulation: With patient Time For Goal Achievement: 07/04/14 Potential to Achieve Goals: Good ADL Goals Pt Will Perform Grooming: with supervision;standing Pt Will Perform Lower Body Bathing: with supervision;with adaptive equipment;sit to/from stand Pt Will Perform Lower Body Dressing: with supervision;with adaptive equipment;sit to/from stand Pt Will Transfer to Toilet: with supervision;ambulating;bedside commode;grab bars Pt Will Perform Toileting - Clothing Manipulation and hygiene: with supervision;sit to/from stand Pt Will Perform Tub/Shower Transfer: Shower transfer;with supervision;ambulating;rolling walker  OT Frequency: Min 2X/week   Barriers to D/C:            Co-evaluation              End of Session Equipment Utilized During Treatment: Back brace;Rolling walker Nurse Communication: Mobility status  Activity Tolerance: Patient tolerated treatment well Patient left: in chair;with call bell/phone within reach;with family/visitor present   Time: 0347-4259 OT Time Calculation (min): 23 min Charges:  OT General Charges $OT Visit: 1 Procedure OT Evaluation $Initial OT Evaluation Tier I: 1 Procedure OT Treatments $Self Care/Home Management : 8-22 mins G-Codes:    Valente Fosberg M 07-07-14, 4:49 PM

## 2014-06-27 NOTE — Op Note (Signed)
Preop diagnosis: Spinal stenosis L1-L2 L2-L3 with complete myelographic block status post L3-4 fusion Postop diagnosis: Same Procedure: Exploration of fusion L3-4 L1-2 L2-L3 decompressive laminectomy for relief of central and lateral recess stenosis Bilateral L1-L2 3 microdiscectomy L1-L2, L2-L3 posterior lumbar interbody fusion with peek interbody spacer L1-2 L2-L3 posterior lateral fusion L1-L2 3 segmental pedicle screw instrumentation with Pathfinder pedicle screw system Surgeon: Miro Balderson Asst.: [name]  After being placed the prone position the patient's back was prepped and draped in the usual sterile fashion. Previous lumbar incision was opened up and extended superiorly. It was then carried down to the spinous processes of what we expected was L1 and L2. We then did a subperiosteal dissection bilaterally on the spinous processes lamina into the far lateral region to identify the transverse processes of L1 and L2. We then dissected into the soft tissue and identified the previous instrumentation at L3-4 bilaterally. We removed the top loading nuts and removed the rods without difficulty. We then removed the spinous processes of L1 and L2. We then did generous laminotomies bilaterally at L1-2 and L2-3 by removing the inferior 80% of the L1 lamina and L2 lamina and the medial three quarters of the facet joint at both levels. Residual bone was saved for use later in the case. Hypertrophic ligamentum flavum was then removed and then the residual midline structures were removed to complete the bilateral decompression at L1-2 and L2-3. Entered the disc space bilaterally and thoroughly cleaned the disc out with pituitary rongeurs and curettes. Thorough displaced cleanout was carried out while the same time great care was taken to avoid injury to the neural elements. We then prepared the disc for interbody fusion. At L1-2 we distracted the disc space opened to an 9 mm size at L2-3 we did 10 mm size. We then  filled cages filled with a mixture of autologous bone morselized allograft and impacted them bilaterally both levels without difficulty. Prior to placing the second cages at both levels we placed a mixture of autologous bone and morselized allograft deep within the interspace to help with the interbody fusion. We then placed pedicle screws at L1 and L2 bilaterally in standard fashion. We used a drill hole entry point and then tapped with a 6 mm tap. We placed 6.5 mm x 45 mm screws at L1 and L2 bilaterally. On the left side at L3 we checked the screw and removed it and replaced with a larger 7.5 x 50 mm screw. We then decorticated the far lateral region placed a mixture of autologous bone and morselized allograft for posterolateral fusion. We then chose appropriate length rods and secured them to the top of the screws without difficulty. We placed top loading nuts and did tightening and final tightening with torque and counter torque. Final fluoroscopy in AP lateral direction looked excellent. Irrigation was carried out and any bleeding control proper coagulation Gelfoam. An epidural drain was left in the epidural space and brought out through a separate stab wound incision. The was then closed in multiple layers of Vicryl on the muscle fascia subcutaneous and subcuticular tissues. A running locking Prolene was placed on the skin. Shortness was then applied and the patient was extubated and taken to recovery room in stable condition.

## 2014-06-27 NOTE — Evaluation (Signed)
Physical Therapy Evaluation Patient Details Name: Kevin Rogers MRN: [id number] DOB: 07-Feb-1932 Today's Date: 06/27/2014   History of Present Illness  Patient is an 81 y.o. male with PMH of L3-4 lateral fusion 2014, HTN, HLD, DM and chronic leukemia now s/o L1-2, L2-3 PLIF due to stenosis.  Clinical Impression  Patient presents s/p 2 level lumbar fusion with decreased independence with mobility due to deficits listed in PT problem list.  He will benefit from skilled PT in the acute setting to allow return home with girlfriend to assist and HHPT.  Will benefit from 3:1 for home use.    Follow Up Recommendations Home health PT;Supervision/Assistance - 24 hour    Equipment Recommendations  3in1 (PT)    Recommendations for Other Services       Precautions / Restrictions Precautions Precautions: Back Precaution Booklet Issued: No Precaution Comments: reviewed precautions verbally Required Braces or Orthoses: Spinal Brace Spinal Brace: Lumbar corset;Applied in sitting position      Mobility  Bed Mobility               General bed mobility comments: NT as pt already in recliner, demonstrated log roll technique and sii<>sidelying   Transfers Overall transfer level: Needs assistance Equipment used: Rolling walker (2 wheeled) Transfers: Sit to/from Stand Sit to Stand: Min assist         General transfer comment: cues for scooting and hand placement  Ambulation/Gait Ambulation/Gait assistance: Min assist Ambulation Distance (Feet): 100 Feet Assistive device: Rolling walker (2 wheeled) Gait Pattern/deviations: Step-through pattern;Decreased stride length;Decreased dorsiflexion - left;Shuffle;Trunk flexed     General Gait Details: cues for posture, proximity to walker and left foot clearance  Stairs            Wheelchair Mobility    Modified Rankin (Stroke Patients Only)       Balance Overall balance assessment: Needs assistance         Standing  balance support: Bilateral upper extremity supported Standing balance-Leahy Scale: Poor Standing balance comment: needs UE support for balance                             Pertinent Vitals/Pain Pain Assessment: 0-10 Pain Score: 5  Pain Location: midline low back Pain Descriptors / Indicators: Sore Pain Intervention(s): Monitored during session;Premedicated before session    Home Living Family/patient expects to be discharged to:: Private residence Living Arrangements: Spouse/significant other Available Help at Discharge: Friend(s);Available 24 hours/day Type of Home: House Home Access: Stairs to enter Entrance Stairs-Rails: Left Entrance Stairs-Number of Steps: 2 Home Layout: Two level;Able to live on main level with bedroom/bathroom Home Equipment: Gilford Rile - 2 wheels;Cane - single point      Prior Function Level of Independence: Independent with assistive device(s)               Hand Dominance        Extremity/Trunk Assessment               Lower Extremity Assessment: RLE deficits/detail;LLE deficits/detail RLE Deficits / Details: AROM grossly WFL, strength hip flexion 4/5, knee extension 4+/5, ankle DF 4-/5 LLE Deficits / Details: AROM grossly WFL, strength hip flexion 4-/5, knee extension 4/5, ankle DF 3-/5     Communication   Communication: No difficulties  Cognition Arousal/Alertness: Awake/alert Behavior During Therapy: WFL for tasks assessed/performed Overall Cognitive Status: Within Functional Limits for tasks assessed  General Comments General comments (skin integrity, edema, etc.): Patient educated in back precautions with bed mobility and car tranfers as well as for sit<>stand.    Exercises        Assessment/Plan    PT Assessment Patient needs continued PT services  PT Diagnosis Abnormality of gait;Acute pain;Generalized weakness   PT Problem List Decreased strength;Decreased mobility;Decreased  safety awareness;Pain;Decreased knowledge of use of DME;Decreased balance;Decreased activity tolerance  PT Treatment Interventions DME instruction;Therapeutic exercise;Gait training;Balance training;Stair training;Functional mobility training;Therapeutic activities;Patient/family education   PT Goals (Current goals can be found in the Care Plan section) Acute Rehab PT Goals Patient Stated Goal: To return home PT Goal Formulation: With patient/family Time For Goal Achievement: 07/02/14 Potential to Achieve Goals: Good    Frequency Min 6X/week   Barriers to discharge        Co-evaluation               End of Session Equipment Utilized During Treatment: Gait belt;Back brace Activity Tolerance: Patient tolerated treatment well Patient left: in chair;with call bell/phone within reach;with family/visitor present           Time: 0905-0933 PT Time Calculation (min) (ACUTE ONLY): 28 min   Charges:   PT Evaluation $Initial PT Evaluation Tier I: 1 Procedure PT Treatments $Gait Training: 8-22 mins   PT G Codes:        WYNN,CYNDI 07/13/14, 10:20 AM  Magda Kiel, [place] 2014/07/13

## 2014-06-28 LAB — GLUCOSE, CAPILLARY
Glucose-Capillary: 105 mg/dL — ABNORMAL HIGH (ref 70–99)
Glucose-Capillary: 124 mg/dL — ABNORMAL HIGH (ref 70–99)
Glucose-Capillary: 101 mg/dL — ABNORMAL HIGH (ref 70–99)

## 2014-06-28 MED ORDER — OXYCODONE-ACETAMINOPHEN 5-325 MG PO TABS: 1.0000 | ORAL_TABLET | ORAL | Status: DC | PRN

## 2014-06-28 NOTE — Progress Notes (Signed)
Occupational Therapy Treatment Patient Details Name: Kevin Rogers MRN: [id number] DOB: 1932-04-29 Today's Date: 06/28/2014    History of present illness Patient is an 81 y.o. male with PMH of L3-4 lateral fusion 2014, HTN, HLD, DM and chronic leukemia now s/o L1-2, L2-3 PLIF due to stenosis.   OT comments  Pt. Progressing well with acute OT goals and is clear for d/c.  Reports strong family support and will have assist with all LB ADLS.  Able to return demo of shower stall transfer and maintained back precautions while completing functional mobility and donning/doffing brace.  Follow Up Recommendations  No OT follow up;Supervision/Assistance - 24 hour    Equipment Recommendations  3 in 1 bedside comode    Recommendations for Other Services      Precautions / Restrictions Precautions Precautions: Back Precaution Comments: reviewed precautions verbally Required Braces or Orthoses: Spinal Brace Spinal Brace: Lumbar corset;Applied in sitting position       Mobility Bed Mobility Overal bed mobility: Needs Assistance Bed Mobility: Rolling;Sidelying to Sit;Sit to Sidelying Rolling: Supervision Sidelying to sit: Supervision     Sit to sidelying: Min assist General bed mobility comments: hob flat no rails in/out of left side for practice for home, requies assist to bring b les into bed  Transfers Overall transfer level: Needs assistance Equipment used: Rolling walker (2 wheeled) Transfers: Sit to/from Stand Sit to Stand: Min guard         General transfer comment: cues to drop shoulders and relax while in standing and push through b ues when powering up    Balance                                   ADL Overall ADL's : Needs assistance/impaired               Lower Body Bathing Details (indicate cue type and reason): declines review and use of A/E states family available to assist at d/c Upper Body Dressing : Set up;Sitting Upper Body Dressing Details  (indicate cue type and reason): able to don brace with set up   Lower Body Dressing Details (indicate cue type and reason): declines review and use of A/E states family available to assist at d/c         Tub/ Shower Transfer: Walk-in shower;Min guard;Ambulation;Anterior/posterior;Rolling walker;Grab bars   Functional mobility during ADLs: Min guard;Rolling walker General ADL Comments: safe return demo of shower stall transfer, declines A/E will have family assist      Vision                     Perception     Praxis      Cognition   Behavior During Therapy: WFL for tasks assessed/performed Overall Cognitive Status: Within Functional Limits for tasks assessed                       Extremity/Trunk Assessment               Exercises     Shoulder Instructions       General Comments      Pertinent Vitals/ Pain       Pain Assessment: No/denies pain  Home Living  Prior Functioning/Environment              Frequency Min 2X/week     Progress Toward Goals  OT Goals(current goals can now be found in the care plan section)  Progress towards OT goals: Goals met/education completed, patient discharged from [facility] Discharge plan remains appropriate    Co-evaluation                 End of Session Equipment Utilized During Treatment: Rolling walker;Back brace   Activity Tolerance Patient tolerated treatment well   Patient Left in bed;with call bell/phone within reach;with bed alarm set   Nurse Communication          Time: 0944-1000 OT Time Calculation (min): 16 min  Charges: OT General Charges $OT Visit: 1 Procedure OT Treatments $Self Care/Home Management : 8-22 mins  Janice Coffin, COTA/L 06/28/2014, 10:05 AM

## 2014-06-28 NOTE — Progress Notes (Signed)
Physical Therapy Treatment Patient Details Name: Kevin Rogers MRN: [id number] DOB: 1932/01/10 Today's Date: 06/28/2014    History of Present Illness Patient is an 81 y.o. male with PMH of L3-4 lateral fusion 2014, HTN, HLD, DM and chronic leukemia now s/o L1-2, L2-3 PLIF due to stenosis.    PT Comments    Patient with limited mobility, needs rest break to manage pain and fatigue although he is demonstrating progression since evaluation.  Patient reports he will be fine "once I get in the house: i am not going to move."  Although patient can benefit from further practice with stairs and general mobility, it appears that patient has adequate support for discharge today.  Follow Up Recommendations  Home health PT;Supervision/Assistance - 24 hour     Equipment Recommendations       Recommendations for Other Services       Precautions / Restrictions Precautions Precautions: Back Precaution Booklet Issued: No Precaution Comments: reviewed precautions verbally Required Braces or Orthoses: Spinal Brace Spinal Brace: Lumbar corset (was on upon entering room) Restrictions Weight Bearing Restrictions: No    Mobility  Bed Mobility Overal bed mobility: Needs Assistance Bed Mobility: Supine to Sit         Sit to sidelying: Supervision (min cues for technique) General bed mobility comments: entered from right side of bed without rail  Transfers Overall transfer level: Needs assistance Equipment used: Rolling walker (2 wheeled) Transfers: Sit to/from Stand Sit to Stand: Min guard;From elevated surface (incr time)         General transfer comment: min cues for hand placement  Ambulation/Gait Ambulation/Gait assistance: Supervision Ambulation Distance (Feet): 75 Feet (x 2) Assistive device: Rolling walker (2 wheeled) Gait Pattern/deviations: Shuffle;Trunk flexed     General Gait Details: cues for posture and to maintain walker alignment during turns   Stairs Stairs:  Yes   Stair Management: Two rails;Step to pattern;Forwards Number of Stairs: 3 General stair comments: min cues for sequencing  Wheelchair Mobility    Modified Rankin (Stroke Patients Only)       Balance Overall balance assessment: Needs assistance Sitting-balance support: No upper extremity supported;Feet supported Sitting balance-Leahy Scale: Good     Standing balance support: Bilateral upper extremity supported Standing balance-Leahy Scale: Poor Standing balance comment: heavy reliance on external support                    Cognition Arousal/Alertness: Awake/alert Behavior During Therapy: WFL for tasks assessed/performed Overall Cognitive Status: Within Functional Limits for tasks assessed                      Exercises      General Comments        Pertinent Vitals/Pain Pain Assessment: 0-10 Pain Score: 6  Pain Location: back, hips Pain Descriptors / Indicators: Aching;Sore Pain Intervention(s): Limited activity within patient's tolerance;Monitored during session    Home Living                      Prior Function            PT Goals (current goals can now be found in the care plan section) Acute Rehab PT Goals Patient Stated Goal: I'd like to go home today if possible PT Goal Formulation: With patient/family Time For Goal Achievement: 07/02/14 Potential to Achieve Goals: Good Progress towards PT goals: Progressing toward goals    Frequency  Min 6X/week    PT Plan Current plan remains appropriate  Co-evaluation             End of Session Equipment Utilized During Treatment: Back brace Activity Tolerance: Patient limited by pain;Patient tolerated treatment well Patient left: in bed;with call bell/phone within reach;with family/visitor present     Time: 3086-5784 PT Time Calculation (min) (ACUTE ONLY): 27 min  Charges:  $Gait Training: 23-37 mins                    G CodesMalka So,  [place] [phone] [place] 06/28/2014, 5:40 PM

## 2014-06-28 NOTE — Discharge Summary (Signed)
  Physician Discharge Summary  Patient ID: Kevin Rogers MRN: [id number] DOB/AGE: 79-Dec-1933 79 y.o.  Admit date: 06/26/2014 Discharge date: 06/28/2014  Admission Diagnoses: Lumbar spinal stenosis  Discharge Diagnoses: Same Active Problems:   Lumbar spinal stenosis   Discharged Condition: Stable  Hospital Course:  Mrs. Kevin Rogers is a 82 y.o. male electively admitted after uncomplicated Z6-0, F0-9 PLIF. He was at his baseline postoperatively, with mild right foot weakness. He was ambulating with a walker, voiding normally, with pain under control.  Treatments: Surgery - L1-2, L2-3 PLIF  Discharge Exam: Blood pressure 93/47, pulse 84, temperature 99.7 F (37.6 C), temperature source Oral, resp. rate 20, height 6' (1.829 m), weight 114.306 kg (252 lb), SpO2 100 %. Awake, alert, oriented Speech fluent, appropriate CN grossly intact 5/5 BUE/BLE x 4/5 Right DF Wound c/d/i  Follow-up: Follow-up in Dr. Sande Rives office [facility] [phone]) in 2-3 weeks  Disposition: 01-Home or Self Care     Medication List    TAKE these medications        acetaminophen 500 MG tablet  Commonly known as:  TYLENOL  Take 500-1,000 mg by mouth every 6 (six) hours as needed (pain).     allopurinol 300 MG tablet  Commonly known as:  ZYLOPRIM  Take 1 tablet (300 mg total) by mouth daily.     glipiZIDE 5 MG 24 hr tablet  Commonly known as:  GLUCOTROL XL  TAKE 1 TABLET EVERY DAY     GLUCOS-CHONDROIT-MSM COMPLEX Tabs  Take 1 tablet by mouth 2 (two) times daily.     losartan 100 MG tablet  Commonly known as:  COZAAR  Take 1 tablet (100 mg total) by mouth daily.     meloxicam 15 MG tablet  Commonly known as:  MOBIC  Take 1 tablet (15 mg total) by mouth daily.     oxyCODONE-acetaminophen 5-325 MG per tablet  Commonly known as:  PERCOCET/ROXICET  Take 1-2 tablets by mouth every 4 (four) hours as needed for moderate pain.     tamsulosin 0.4 MG Caps capsule   Commonly known as:  FLOMAX  Take 1 capsule (0.4 mg total) by mouth daily.     travoprost (benzalkonium) 0.004 % ophthalmic solution  Commonly known as:  TRAVATAN  Place 1 drop into the left eye at bedtime.     vitamin B-12 1000 MCG tablet  Commonly known as:  CYANOCOBALAMIN  Take 1,000 mcg by mouth daily.           Follow-up Information    Follow up with [facility].   Why:  3n1 (commode) and home health physical therapy   Contact information:   [place] [address] [phone]       Signed: Consuella Lose, Loletha Grayer 06/28/2014, 11:20 AM

## 2014-06-28 NOTE — Progress Notes (Signed)
Pt d/c to home by car with family. Assessment stable. Prescriptions given. 

## 2014-06-28 NOTE — Discharge Instructions (Signed)
Spinal Fusion Care After  These instructions give you information on caring for yourself after your procedure. Your doctor may also give you more specific instructions. Call your doctor if you have any problems or questions after your procedure. HOME CARE  Only take medicines as told by your doctor.  Do not drive if you are taking certain pain medicines (narcotics).  Change your bandage (dressing) as told by your doctor.  Do not get the wound wet. Do not take baths or swim until your doctor says it is okay.  Check the wound often for redness, puffiness (swelling), or leaking fluids.  Ask your doctor what activities you should avoid and for how long.  Walk as much as you can.  Do not sit for long periods of time. Change positions every hour.  Do not lift anything heavier than 5 to 10 pounds (2.25 to 4.5 kilograms) until your doctor says it is safe.  Do not twist or bend for 6 weeks. Try not to pull on things. Do not hold things away from your body.  Ask your doctor what exercises you should do. Exercises can help make the back stronger. GET HELP RIGHT AWAY IF:  Your pain suddenly gets worse.  The wound is red, puffy, bleeding, or leaking fluid.  Your legs or feet are painful, puffy, weak, or lose feeling (numb).  You cannot control when you pee (urinate) or poop (bowel movement).  You have trouble breathing.  You have chest pain.  You have a temperature by mouth above 102 F (38.9 C), not controlled by medicine. MAKE SURE YOU:  Understand these instructions.  Will watch your condition.  Will get help right away if you are not doing well or get worse. Document Released: 08/12/2010 Document Revised: 07/11/2011 Document Reviewed: 08/12/2010 [facility] Patient Information 2015 [place], [place]. This information is not intended to replace advice given to you by your health care provider. Make sure you discuss any questions you have with your health care provider. Spinal  Fusion Spinal fusion is a procedure to make 2 or more of the bones in your spinal column (vertebrae) grow together (fuse). This procedure stops movement between the vertebrae and can relieve pain and prevent deformity.  Spinal fusion is used to treat the following conditions:  Fractures of the spine.  Herniated disk (the spongy material [cartilage] between the vertebrae).  Abnormal curvatures of the spine, such as scoliosis or kyphosis.  A weak or an unstable spine, caused by infections or tumor. RISKS AND COMPLICATIONS Complications associated with spinal fusion are rare, but they can occur. Possible complications include:  Bleeding.  Infection near the incision.  Nerve damage. Signs of nerve damage are back pain, pain in one or both legs, weakness, or numbness.  Spinal fluid leakage.  Blood clot in your leg, which can move to your lungs.  Difficulty controlling urination or bowel movements. BEFORE THE PROCEDURE  A medical evaluation will be done. This will include a physical exam, blood tests, and imaging exams.  You will talk with an anesthesiologist. This is the person who will be in charge of the anesthesia during the procedure. Spinal fusion usually requires that you are asleep during the procedure (general anesthesia).  You will need to stop taking certain medicines, particularly those associated with an increased risk of bleeding. Ask your caregiver about changing or stopping your regular medicines.  If you smoke, you will need to stop at least 2 weeks before the procedure. Smoking can slow down the healing process,  especially fusion of the vertebrae, and increase the risk of complications.  Do not eat or drink anything for at least 8 hours before the procedure. PROCEDURE  A cut (incision) is made over the vertebrae that will be fused. The back muscles are separated from the vertebrae. If you are having this procedure to treat a herniated disk, the disc material pressing  on the nerve root is removed (decompression). The area where the disk is removed is then filled with extra bone. Bone from another part of your body (autogenous bone) or bone from a bone donor (allograft bone) may be used. The extra bone promotes fusion between the vertebrae. Sometimes, specific medicines are added to the fusion area to promote bone healing. In most cases, screws and rods or metal plates will be used to attach the vertebrae to stabilize them while they fuse.  AFTER THE PROCEDURE   You will stay in a recovery area until the anesthesia has worn off. Your blood pressure and pulse will be checked frequently.  You will be given antibiotics to prevent infection.  You may continue to receive fluids through an intravenous (IV) tube while you are still in the hospital.  Pain after surgery is normal. You will be given pain medicine.  You will be taught how to move correctly and how to stand and walk. While in bed, you will be instructed to turn frequently, using a "log rolling" technique, in which the entire body is moved without twisting the back. Document Released: 01/15/2003 Document Revised: 07/11/2011 Document Reviewed: 07/01/2010 [facility] Patient Information 2015 [place], [place]. This information is not intended to replace advice given to you by your health care provider. Make sure you discuss any questions you have with your health care provider.

## 2014-06-28 NOTE — Care Management Note (Signed)
    Page 1 of 2   06/28/2014     3:24:55 PM CARE MANAGEMENT NOTE 06/28/2014  Patient:  ORLANDO, DEVEREUX   Account Number:  [id number]  Date Initiated:  06/28/2014  Documentation initiated by:  [facility]  Subjective/Objective Assessment:   adm: Lumbar spinal stenosis     Action/Plan:   discharge planning   Anticipated DC Date:  06/28/2014   Anticipated DC Plan:  [place]  CM consult      [facility] Choice  HOME HEALTH   Choice offered to / List presented to:  C-1 Patient   DME arranged  3-N-1      DME agency  [place] arranged  [place].   Status of service:  Completed, signed off Medicare Important Message given?   (If response is "NO", the following Medicare IM given date fields will be blank) Date Medicare IM given:   Medicare IM given by:   Date Additional Medicare IM given:   Additional Medicare IM given by:    Discharge Disposition:  [place]  Per UR Regulation:    If discussed at Long Length of Stay Meetings, dates discussed:    Comments:  06/28/14 11:03 CM met with pt in room to offer choice of home health agency. Pt chooses [facility] to render HHPT.  Address and contact information verified by pt.  CM called [facility] DME rep, Jeneen Rinks to please deliver the 3n1 to room prior to discharge.  Pt has rolling walker at home. Referral called to [facility] rep, Lecretia for HHPT.  No other CM needs were comunicated.  Mariane Masters, BSN, [place].

## 2014-06-30 DIAGNOSIS — E785 Hyperlipidemia, unspecified: Secondary | ICD-10-CM | POA: Diagnosis not present

## 2014-06-30 DIAGNOSIS — I10 Essential (primary) hypertension: Secondary | ICD-10-CM | POA: Diagnosis not present

## 2014-06-30 DIAGNOSIS — Z4789 Encounter for other orthopedic aftercare: Secondary | ICD-10-CM | POA: Diagnosis not present

## 2014-06-30 DIAGNOSIS — E119 Type 2 diabetes mellitus without complications: Secondary | ICD-10-CM | POA: Diagnosis not present

## 2014-06-30 DIAGNOSIS — C9510 Chronic leukemia of unspecified cell type not having achieved remission: Secondary | ICD-10-CM | POA: Diagnosis not present

## 2014-07-02 DIAGNOSIS — E119 Type 2 diabetes mellitus without complications: Secondary | ICD-10-CM | POA: Diagnosis not present

## 2014-07-02 DIAGNOSIS — I10 Essential (primary) hypertension: Secondary | ICD-10-CM | POA: Diagnosis not present

## 2014-07-02 DIAGNOSIS — Z4789 Encounter for other orthopedic aftercare: Secondary | ICD-10-CM | POA: Diagnosis not present

## 2014-07-02 DIAGNOSIS — C9510 Chronic leukemia of unspecified cell type not having achieved remission: Secondary | ICD-10-CM | POA: Diagnosis not present

## 2014-07-02 DIAGNOSIS — E785 Hyperlipidemia, unspecified: Secondary | ICD-10-CM | POA: Diagnosis not present

## 2014-07-03 DIAGNOSIS — C9510 Chronic leukemia of unspecified cell type not having achieved remission: Secondary | ICD-10-CM | POA: Diagnosis not present

## 2014-07-03 DIAGNOSIS — Z4789 Encounter for other orthopedic aftercare: Secondary | ICD-10-CM | POA: Diagnosis not present

## 2014-07-03 DIAGNOSIS — E119 Type 2 diabetes mellitus without complications: Secondary | ICD-10-CM | POA: Diagnosis not present

## 2014-07-03 DIAGNOSIS — I10 Essential (primary) hypertension: Secondary | ICD-10-CM | POA: Diagnosis not present

## 2014-07-03 DIAGNOSIS — E785 Hyperlipidemia, unspecified: Secondary | ICD-10-CM | POA: Diagnosis not present

## 2014-07-04 DIAGNOSIS — I10 Essential (primary) hypertension: Secondary | ICD-10-CM | POA: Diagnosis not present

## 2014-07-04 DIAGNOSIS — E119 Type 2 diabetes mellitus without complications: Secondary | ICD-10-CM | POA: Diagnosis not present

## 2014-07-04 DIAGNOSIS — C9510 Chronic leukemia of unspecified cell type not having achieved remission: Secondary | ICD-10-CM | POA: Diagnosis not present

## 2014-07-04 DIAGNOSIS — E785 Hyperlipidemia, unspecified: Secondary | ICD-10-CM | POA: Diagnosis not present

## 2014-07-04 DIAGNOSIS — Z4789 Encounter for other orthopedic aftercare: Secondary | ICD-10-CM | POA: Diagnosis not present

## 2014-07-07 DIAGNOSIS — C9510 Chronic leukemia of unspecified cell type not having achieved remission: Secondary | ICD-10-CM | POA: Diagnosis not present

## 2014-07-07 DIAGNOSIS — Z4789 Encounter for other orthopedic aftercare: Secondary | ICD-10-CM | POA: Diagnosis not present

## 2014-07-07 DIAGNOSIS — E119 Type 2 diabetes mellitus without complications: Secondary | ICD-10-CM | POA: Diagnosis not present

## 2014-07-07 DIAGNOSIS — I10 Essential (primary) hypertension: Secondary | ICD-10-CM | POA: Diagnosis not present

## 2014-07-07 DIAGNOSIS — E785 Hyperlipidemia, unspecified: Secondary | ICD-10-CM | POA: Diagnosis not present

## 2014-07-09 DIAGNOSIS — E785 Hyperlipidemia, unspecified: Secondary | ICD-10-CM | POA: Diagnosis not present

## 2014-07-09 DIAGNOSIS — Z4789 Encounter for other orthopedic aftercare: Secondary | ICD-10-CM | POA: Diagnosis not present

## 2014-07-09 DIAGNOSIS — C9510 Chronic leukemia of unspecified cell type not having achieved remission: Secondary | ICD-10-CM | POA: Diagnosis not present

## 2014-07-09 DIAGNOSIS — E119 Type 2 diabetes mellitus without complications: Secondary | ICD-10-CM | POA: Diagnosis not present

## 2014-07-09 DIAGNOSIS — I10 Essential (primary) hypertension: Secondary | ICD-10-CM | POA: Diagnosis not present

## 2014-07-11 DIAGNOSIS — E785 Hyperlipidemia, unspecified: Secondary | ICD-10-CM | POA: Diagnosis not present

## 2014-07-11 DIAGNOSIS — C9510 Chronic leukemia of unspecified cell type not having achieved remission: Secondary | ICD-10-CM | POA: Diagnosis not present

## 2014-07-11 DIAGNOSIS — Z4789 Encounter for other orthopedic aftercare: Secondary | ICD-10-CM | POA: Diagnosis not present

## 2014-07-11 DIAGNOSIS — E119 Type 2 diabetes mellitus without complications: Secondary | ICD-10-CM | POA: Diagnosis not present

## 2014-07-11 DIAGNOSIS — I10 Essential (primary) hypertension: Secondary | ICD-10-CM | POA: Diagnosis not present

## 2014-07-14 DIAGNOSIS — C9510 Chronic leukemia of unspecified cell type not having achieved remission: Secondary | ICD-10-CM | POA: Diagnosis not present

## 2014-07-14 DIAGNOSIS — E785 Hyperlipidemia, unspecified: Secondary | ICD-10-CM | POA: Diagnosis not present

## 2014-07-14 DIAGNOSIS — E119 Type 2 diabetes mellitus without complications: Secondary | ICD-10-CM | POA: Diagnosis not present

## 2014-07-14 DIAGNOSIS — Z4789 Encounter for other orthopedic aftercare: Secondary | ICD-10-CM | POA: Diagnosis not present

## 2014-07-14 DIAGNOSIS — I10 Essential (primary) hypertension: Secondary | ICD-10-CM | POA: Diagnosis not present

## 2014-07-16 DIAGNOSIS — E785 Hyperlipidemia, unspecified: Secondary | ICD-10-CM | POA: Diagnosis not present

## 2014-07-16 DIAGNOSIS — C9510 Chronic leukemia of unspecified cell type not having achieved remission: Secondary | ICD-10-CM | POA: Diagnosis not present

## 2014-07-16 DIAGNOSIS — E119 Type 2 diabetes mellitus without complications: Secondary | ICD-10-CM | POA: Diagnosis not present

## 2014-07-16 DIAGNOSIS — Z4789 Encounter for other orthopedic aftercare: Secondary | ICD-10-CM | POA: Diagnosis not present

## 2014-07-16 DIAGNOSIS — I10 Essential (primary) hypertension: Secondary | ICD-10-CM | POA: Diagnosis not present

## 2014-07-18 DIAGNOSIS — E119 Type 2 diabetes mellitus without complications: Secondary | ICD-10-CM | POA: Diagnosis not present

## 2014-07-18 DIAGNOSIS — I10 Essential (primary) hypertension: Secondary | ICD-10-CM | POA: Diagnosis not present

## 2014-07-18 DIAGNOSIS — E785 Hyperlipidemia, unspecified: Secondary | ICD-10-CM | POA: Diagnosis not present

## 2014-07-18 DIAGNOSIS — Z4789 Encounter for other orthopedic aftercare: Secondary | ICD-10-CM | POA: Diagnosis not present

## 2014-07-18 DIAGNOSIS — C9510 Chronic leukemia of unspecified cell type not having achieved remission: Secondary | ICD-10-CM | POA: Diagnosis not present

## 2014-07-22 ENCOUNTER — Encounter (HOSPITAL_COMMUNITY): Payer: Self-pay | Admitting: *Deleted

## 2014-07-23 ENCOUNTER — Inpatient Hospital Stay (HOSPITAL_COMMUNITY): Payer: Medicare Other | Admitting: Anesthesiology

## 2014-07-23 ENCOUNTER — Encounter (HOSPITAL_COMMUNITY): Payer: Self-pay | Admitting: *Deleted

## 2014-07-23 ENCOUNTER — Encounter (HOSPITAL_COMMUNITY)
Admission: AD | Disposition: A | Discharge status: Home Health | Payer: Self-pay | Source: Ambulatory Visit | Attending: Neurosurgery

## 2014-07-23 ENCOUNTER — Inpatient Hospital Stay (HOSPITAL_COMMUNITY)
Admission: AD | Admit: 2014-07-23 | Discharge: 2014-07-25 | DRG: 908 | Disposition: A | Discharge status: Home Health | Payer: Medicare Other | Source: Ambulatory Visit | Attending: Neurosurgery | Admitting: Neurosurgery

## 2014-07-23 DIAGNOSIS — T8131XA Disruption of external operation (surgical) wound, not elsewhere classified, initial encounter: Secondary | ICD-10-CM | POA: Diagnosis not present

## 2014-07-23 DIAGNOSIS — Z452 Encounter for adjustment and management of vascular access device: Secondary | ICD-10-CM | POA: Diagnosis not present

## 2014-07-23 DIAGNOSIS — H409 Unspecified glaucoma: Secondary | ICD-10-CM | POA: Diagnosis not present

## 2014-07-23 DIAGNOSIS — Z981 Arthrodesis status: Secondary | ICD-10-CM

## 2014-07-23 DIAGNOSIS — Z79899 Other long term (current) drug therapy: Secondary | ICD-10-CM

## 2014-07-23 DIAGNOSIS — E119 Type 2 diabetes mellitus without complications: Secondary | ICD-10-CM | POA: Diagnosis not present

## 2014-07-23 DIAGNOSIS — E785 Hyperlipidemia, unspecified: Secondary | ICD-10-CM | POA: Diagnosis not present

## 2014-07-23 DIAGNOSIS — C9510 Chronic leukemia of unspecified cell type not having achieved remission: Secondary | ICD-10-CM | POA: Diagnosis present

## 2014-07-23 DIAGNOSIS — M109 Gout, unspecified: Secondary | ICD-10-CM | POA: Diagnosis present

## 2014-07-23 DIAGNOSIS — I10 Essential (primary) hypertension: Secondary | ICD-10-CM | POA: Diagnosis present

## 2014-07-23 DIAGNOSIS — Z87891 Personal history of nicotine dependence: Secondary | ICD-10-CM

## 2014-07-23 DIAGNOSIS — T888XXA Other specified complications of surgical and medical care, not elsewhere classified, initial encounter: Secondary | ICD-10-CM | POA: Diagnosis not present

## 2014-07-23 DIAGNOSIS — Y838 Other surgical procedures as the cause of abnormal reaction of the patient, or of later complication, without mention of misadventure at the time of the procedure: Secondary | ICD-10-CM | POA: Diagnosis present

## 2014-07-23 HISTORY — PX: LUMBAR WOUND DEBRIDEMENT: SHX1988

## 2014-07-23 LAB — GRAM STAIN: Gram Stain: NONE SEEN

## 2014-07-23 LAB — GLUCOSE, CAPILLARY
Glucose-Capillary: 133 mg/dL — ABNORMAL HIGH (ref 70–99)
Glucose-Capillary: 109 mg/dL — ABNORMAL HIGH (ref 70–99)
Glucose-Capillary: 96 mg/dL (ref 70–99)
Glucose-Capillary: 107 mg/dL — ABNORMAL HIGH (ref 70–99)

## 2014-07-23 LAB — SURGICAL PCR SCREEN
MRSA, PCR: NEGATIVE
Staphylococcus aureus: NEGATIVE

## 2014-07-23 LAB — BASIC METABOLIC PANEL
Anion gap: 6 (ref 5–15)
BUN: 19 mg/dL (ref 6–23)
CO2: 25 mmol/L (ref 19–32)
Calcium: 9.4 mg/dL (ref 8.4–10.5)
Chloride: 110 mmol/L (ref 96–112)
Creatinine, Ser: 1.45 mg/dL — ABNORMAL HIGH (ref 0.50–1.35)
GFR calc Af Amer: 51 mL/min — ABNORMAL LOW (ref >90)
GFR calc non Af Amer: 44 mL/min — ABNORMAL LOW (ref >90)
Glucose, Bld: 125 mg/dL — ABNORMAL HIGH (ref 70–99)
Potassium: 4.5 mmol/L (ref 3.5–5.1)
Sodium: 141 mmol/L (ref 135–145)

## 2014-07-23 LAB — CBC
HCT: 31 % — ABNORMAL LOW (ref 39.0–52.0)
Hemoglobin: 10.1 g/dL — ABNORMAL LOW (ref 13.0–17.0)
MCH: 29.2 pg (ref 26.0–34.0)
MCHC: 32.6 g/dL (ref 30.0–36.0)
MCV: 89.6 fL (ref 78.0–100.0)
Platelets: 184 10*3/uL (ref 150–400)
RBC: 3.46 MIL/uL — ABNORMAL LOW (ref 4.22–5.81)
RDW: 13.3 % (ref 11.5–15.5)
WBC: 14.5 10*3/uL — ABNORMAL HIGH (ref 4.0–10.5)

## 2014-07-23 SURGERY — LUMBAR WOUND DEBRIDEMENT
Anesthesia: General | Site: Spine Lumbar

## 2014-07-23 MED ORDER — INSULIN ASPART 100 UNIT/ML ~~LOC~~ SOLN: 0.0000 [IU] | Freq: Every day | SUBCUTANEOUS | Status: DC

## 2014-07-23 MED ORDER — PROPOFOL 10 MG/ML IV BOLUS
INTRAVENOUS | Status: AC
  Filled 2014-07-23: qty 20

## 2014-07-23 MED ORDER — LATANOPROST 0.005 % OP SOLN
1.0000 [drp] | Freq: Every day | OPHTHALMIC | Status: DC
  Administered 2014-07-23 – 2014-07-24 (×2): 1 [drp] via OPHTHALMIC
  Filled 2014-07-23: qty 2.5

## 2014-07-23 MED ORDER — NEOSTIGMINE METHYLSULFATE 10 MG/10ML IV SOLN
INTRAVENOUS | Status: AC
  Filled 2014-07-23: qty 1

## 2014-07-23 MED ORDER — GLYCOPYRROLATE 0.2 MG/ML IJ SOLN
INTRAMUSCULAR | Status: DC | PRN
  Administered 2014-07-23: 0.2 mg via INTRAVENOUS
  Administered 2014-07-23: 0.6 mg via INTRAVENOUS

## 2014-07-23 MED ORDER — POTASSIUM CHLORIDE IN NACL 20-0.45 MEQ/L-% IV SOLN
INTRAVENOUS | Status: DC
  Administered 2014-07-23: 21:00:00 via INTRAVENOUS
  Filled 2014-07-23 (×5): qty 1000

## 2014-07-23 MED ORDER — LOSARTAN POTASSIUM 50 MG PO TABS
50.0000 mg | ORAL_TABLET | Freq: Every day | ORAL | Status: DC
  Administered 2014-07-24 – 2014-07-25 (×2): 50 mg via ORAL
  Filled 2014-07-23 (×2): qty 1

## 2014-07-23 MED ORDER — INSULIN ASPART 100 UNIT/ML ~~LOC~~ SOLN
0.0000 [IU] | Freq: Three times a day (TID) | SUBCUTANEOUS | Status: DC
  Administered 2014-07-24 – 2014-07-25 (×3): 2 [IU] via SUBCUTANEOUS

## 2014-07-23 MED ORDER — HYDROCODONE-ACETAMINOPHEN 5-325 MG PO TABS
1.0000 | ORAL_TABLET | ORAL | Status: DC | PRN
  Administered 2014-07-23 – 2014-07-24 (×3): 2 via ORAL
  Filled 2014-07-23 (×3): qty 2

## 2014-07-23 MED ORDER — VANCOMYCIN HCL IN DEXTROSE 750-5 MG/150ML-% IV SOLN
750.0000 mg | Freq: Two times a day (BID) | INTRAVENOUS | Status: DC
  Administered 2014-07-23 – 2014-07-25 (×4): 750 mg via INTRAVENOUS
  Filled 2014-07-23 (×5): qty 150

## 2014-07-23 MED ORDER — PHENYLEPHRINE HCL 10 MG/ML IJ SOLN
INTRAMUSCULAR | Status: AC
  Filled 2014-07-23: qty 1

## 2014-07-23 MED ORDER — HYDROMORPHONE HCL 1 MG/ML IJ SOLN
1.0000 mg | INTRAMUSCULAR | Status: DC | PRN
  Administered 2014-07-23: 1.5 mg via INTRAMUSCULAR
  Filled 2014-07-23: qty 2

## 2014-07-23 MED ORDER — TAMSULOSIN HCL 0.4 MG PO CAPS
0.4000 mg | ORAL_CAPSULE | Freq: Every day | ORAL | Status: DC
  Administered 2014-07-23 – 2014-07-24 (×2): 0.4 mg via ORAL
  Filled 2014-07-23 (×2): qty 1

## 2014-07-23 MED ORDER — GLYCOPYRROLATE 0.2 MG/ML IJ SOLN
INTRAMUSCULAR | Status: AC
  Filled 2014-07-23: qty 3

## 2014-07-23 MED ORDER — ACETAMINOPHEN 650 MG RE SUPP: 650.0000 mg | RECTAL | Status: DC | PRN

## 2014-07-23 MED ORDER — EPHEDRINE SULFATE 50 MG/ML IJ SOLN
INTRAMUSCULAR | Status: DC | PRN
  Administered 2014-07-23: 10 mg via INTRAVENOUS

## 2014-07-23 MED ORDER — LIDOCAINE HCL (CARDIAC) 20 MG/ML IV SOLN
INTRAVENOUS | Status: DC | PRN
Start: 2014-07-23 — End: 2014-07-23
  Administered 2014-07-23: 60 mg via INTRAVENOUS

## 2014-07-23 MED ORDER — SODIUM CHLORIDE 0.9 % IV SOLN
INTRAVENOUS | Status: DC | PRN
  Administered 2014-07-23: 10:00:00 via INTRAVENOUS

## 2014-07-23 MED ORDER — INSULIN ASPART 100 UNIT/ML ~~LOC~~ SOLN
4.0000 [IU] | Freq: Three times a day (TID) | SUBCUTANEOUS | Status: DC
  Administered 2014-07-24 – 2014-07-25 (×5): 4 [IU] via SUBCUTANEOUS

## 2014-07-23 MED ORDER — FENTANYL CITRATE 0.05 MG/ML IJ SOLN
25.0000 ug | INTRAMUSCULAR | Status: DC | PRN
  Administered 2014-07-23 (×2): 50 ug via INTRAVENOUS
  Administered 2014-07-23 (×2): 25 ug via INTRAVENOUS

## 2014-07-23 MED ORDER — BACITRACIN ZINC 500 UNIT/GM EX OINT
TOPICAL_OINTMENT | CUTANEOUS | Status: DC | PRN
  Administered 2014-07-23: 1 via TOPICAL

## 2014-07-23 MED ORDER — GLIPIZIDE ER 5 MG PO TB24
5.0000 mg | ORAL_TABLET | Freq: Every day | ORAL | Status: DC
  Administered 2014-07-24 – 2014-07-25 (×2): 5 mg via ORAL
  Filled 2014-07-23 (×3): qty 1

## 2014-07-23 MED ORDER — ONDANSETRON HCL 4 MG/2ML IJ SOLN
INTRAMUSCULAR | Status: AC
  Filled 2014-07-23: qty 2

## 2014-07-23 MED ORDER — NEOSTIGMINE METHYLSULFATE 10 MG/10ML IV SOLN
INTRAVENOUS | Status: DC | PRN
  Administered 2014-07-23: 4 mg via INTRAVENOUS

## 2014-07-23 MED ORDER — SODIUM CHLORIDE 0.9 % IR SOLN
Status: DC | PRN
  Administered 2014-07-23 (×3): 500 mL

## 2014-07-23 MED ORDER — ARTIFICIAL TEARS OP OINT
TOPICAL_OINTMENT | OPHTHALMIC | Status: AC
  Filled 2014-07-23: qty 3.5

## 2014-07-23 MED ORDER — TRAVOPROST 0.004 % OP SOLN: 1.0000 [drp] | Freq: Every day | OPHTHALMIC | Status: DC

## 2014-07-23 MED ORDER — MIDAZOLAM HCL 2 MG/2ML IJ SOLN
INTRAMUSCULAR | Status: AC
  Filled 2014-07-23: qty 2

## 2014-07-23 MED ORDER — 0.9 % SODIUM CHLORIDE (POUR BTL) OPTIME
TOPICAL | Status: DC | PRN
  Administered 2014-07-23: 1000 mL

## 2014-07-23 MED ORDER — FENTANYL CITRATE 0.05 MG/ML IJ SOLN
INTRAMUSCULAR | Status: DC | PRN
  Administered 2014-07-23 (×5): 50 ug via INTRAVENOUS

## 2014-07-23 MED ORDER — SODIUM CHLORIDE 0.9 % IJ SOLN
3.0000 mL | Freq: Two times a day (BID) | INTRAMUSCULAR | Status: DC
  Administered 2014-07-23 – 2014-07-24 (×2): 3 mL via INTRAVENOUS

## 2014-07-23 MED ORDER — PHENOL 1.4 % MT LIQD: 1.0000 | OROMUCOSAL | Status: DC | PRN

## 2014-07-23 MED ORDER — LIDOCAINE HCL (CARDIAC) 20 MG/ML IV SOLN
INTRAVENOUS | Status: AC
  Filled 2014-07-23: qty 5

## 2014-07-23 MED ORDER — MENTHOL 3 MG MT LOZG: 1.0000 | LOZENGE | OROMUCOSAL | Status: DC | PRN

## 2014-07-23 MED ORDER — MUPIROCIN 2 % EX OINT
1.0000 "application " | TOPICAL_OINTMENT | Freq: Once | CUTANEOUS | Status: AC
  Administered 2014-07-23: 1 via TOPICAL

## 2014-07-23 MED ORDER — VANCOMYCIN HCL IN DEXTROSE 1-5 GM/200ML-% IV SOLN
INTRAVENOUS | Status: AC
  Administered 2014-07-23: 1000 mg via INTRAVENOUS
  Filled 2014-07-23: qty 200

## 2014-07-23 MED ORDER — FENTANYL CITRATE 0.05 MG/ML IJ SOLN
INTRAMUSCULAR | Status: AC
  Filled 2014-07-23: qty 5

## 2014-07-23 MED ORDER — ACETAMINOPHEN 325 MG PO TABS: 650.0000 mg | ORAL_TABLET | ORAL | Status: DC | PRN

## 2014-07-23 MED ORDER — MUPIROCIN 2 % EX OINT
TOPICAL_OINTMENT | CUTANEOUS | Status: AC
  Administered 2014-07-23: 1 via TOPICAL
  Filled 2014-07-23: qty 22

## 2014-07-23 MED ORDER — HEMOSTATIC AGENTS (NO CHARGE) OPTIME
TOPICAL | Status: DC | PRN
  Administered 2014-07-23: 1 via TOPICAL

## 2014-07-23 MED ORDER — THROMBIN 5000 UNITS EX SOLR
CUTANEOUS | Status: DC | PRN
  Administered 2014-07-23 (×2): 5000 [IU] via TOPICAL

## 2014-07-23 MED ORDER — PROPOFOL 10 MG/ML IV BOLUS
INTRAVENOUS | Status: DC | PRN
  Administered 2014-07-23: 200 mg via INTRAVENOUS

## 2014-07-23 MED ORDER — ARTIFICIAL TEARS OP OINT
TOPICAL_OINTMENT | OPHTHALMIC | Status: DC | PRN
  Administered 2014-07-23: 1 via OPHTHALMIC

## 2014-07-23 MED ORDER — PHENYLEPHRINE HCL 10 MG/ML IJ SOLN
INTRAMUSCULAR | Status: DC | PRN
  Administered 2014-07-23: 80 ug via INTRAVENOUS
  Administered 2014-07-23: 120 ug via INTRAVENOUS
  Administered 2014-07-23: 80 ug via INTRAVENOUS
  Administered 2014-07-23: 120 ug via INTRAVENOUS

## 2014-07-23 MED ORDER — SODIUM CHLORIDE 0.9 % IJ SOLN: 3.0000 mL | INTRAMUSCULAR | Status: DC | PRN

## 2014-07-23 MED ORDER — FENTANYL CITRATE 0.05 MG/ML IJ SOLN
INTRAMUSCULAR | Status: AC
  Filled 2014-07-23: qty 2

## 2014-07-23 MED ORDER — ONDANSETRON HCL 4 MG/2ML IJ SOLN
INTRAMUSCULAR | Status: DC | PRN
  Administered 2014-07-23: 4 mg via INTRAVENOUS

## 2014-07-23 MED ORDER — PHENYLEPHRINE 40 MCG/ML (10ML) SYRINGE FOR IV PUSH (FOR BLOOD PRESSURE SUPPORT)
PREFILLED_SYRINGE | INTRAVENOUS | Status: AC
  Filled 2014-07-23: qty 10

## 2014-07-23 MED ORDER — ROCURONIUM BROMIDE 50 MG/5ML IV SOLN
INTRAVENOUS | Status: AC
  Filled 2014-07-23: qty 1

## 2014-07-23 MED ORDER — SODIUM CHLORIDE 0.9 % IV SOLN
250.0000 mL | INTRAVENOUS | Status: DC
  Administered 2014-07-23: 250 mL via INTRAVENOUS

## 2014-07-23 MED ORDER — MIDAZOLAM HCL 5 MG/5ML IJ SOLN
INTRAMUSCULAR | Status: DC | PRN
  Administered 2014-07-23: 2 mg via INTRAVENOUS

## 2014-07-23 MED ORDER — LACTATED RINGERS IV SOLN
INTRAVENOUS | Status: DC | PRN
  Administered 2014-07-23: 08:00:00 via INTRAVENOUS

## 2014-07-23 MED ORDER — ONDANSETRON HCL 4 MG/2ML IJ SOLN: 4.0000 mg | INTRAMUSCULAR | Status: DC | PRN

## 2014-07-23 MED ORDER — ROCURONIUM BROMIDE 100 MG/10ML IV SOLN
INTRAVENOUS | Status: DC | PRN
  Administered 2014-07-23: 35 mg via INTRAVENOUS

## 2014-07-23 SURGICAL SUPPLY — 49 items
APL SKNCLS STERI-STRIP NONHPOA (GAUZE/BANDAGES/DRESSINGS) ×1
BAG DECANTER FOR FLEXI CONT (MISCELLANEOUS) ×9 IMPLANT
BENZOIN TINCTURE PRP APPL 2/3 (GAUZE/BANDAGES/DRESSINGS) ×3 IMPLANT
BLADE CLIPPER SURG (BLADE) IMPLANT
BRUSH SCRUB EZ PLAIN DRY (MISCELLANEOUS) ×3 IMPLANT
CANISTER SUCT 3000ML PPV (MISCELLANEOUS) ×3 IMPLANT
CLEANER TIP ELECTROSURG 2X2 (MISCELLANEOUS) IMPLANT
CLOSURE WOUND 1/2 X4 (GAUZE/BANDAGES/DRESSINGS) ×5
CONT SPEC 4OZ CLIKSEAL STRL BL (MISCELLANEOUS) ×3 IMPLANT
DRAPE LAPAROTOMY 100X72X124 (DRAPES) ×3 IMPLANT
DRSG OPSITE POSTOP 4X8 (GAUZE/BANDAGES/DRESSINGS) IMPLANT
DRSG TELFA 3X8 NADH (GAUZE/BANDAGES/DRESSINGS) IMPLANT
ELECT REM PT RETURN 9FT ADLT (ELECTROSURGICAL) ×3
ELECTRODE REM PT RTRN 9FT ADLT (ELECTROSURGICAL) ×1 IMPLANT
EVACUATOR 1/8 PVC DRAIN (DRAIN) ×3 IMPLANT
GAUZE SPONGE 4X4 12PLY STRL (GAUZE/BANDAGES/DRESSINGS) ×3 IMPLANT
GAUZE SPONGE 4X4 16PLY XRAY LF (GAUZE/BANDAGES/DRESSINGS) IMPLANT
GLOVE BIOGEL PI IND STRL 7.5 (GLOVE) ×1 IMPLANT
GLOVE BIOGEL PI INDICATOR 7.5 (GLOVE) ×2
GLOVE ECLIPSE 8.0 STRL XLNG CF (GLOVE) ×3 IMPLANT
GLOVE EXAM NITRILE LRG STRL (GLOVE) IMPLANT
GLOVE EXAM NITRILE XL STR (GLOVE) IMPLANT
GLOVE EXAM NITRILE XS STR PU (GLOVE) IMPLANT
GLOVE SURG SS PI 7.0 STRL IVOR (GLOVE) ×6 IMPLANT
GOWN STRL REUS W/ TWL LRG LVL3 (GOWN DISPOSABLE) ×1 IMPLANT
GOWN STRL REUS W/ TWL XL LVL3 (GOWN DISPOSABLE) ×1 IMPLANT
GOWN STRL REUS W/TWL 2XL LVL3 (GOWN DISPOSABLE) IMPLANT
GOWN STRL REUS W/TWL LRG LVL3 (GOWN DISPOSABLE) ×3
GOWN STRL REUS W/TWL XL LVL3 (GOWN DISPOSABLE) ×3
KIT BASIN OR (CUSTOM PROCEDURE TRAY) ×3 IMPLANT
KIT ROOM TURNOVER OR (KITS) ×3 IMPLANT
LIQUID BAND (GAUZE/BANDAGES/DRESSINGS) ×9 IMPLANT
NS IRRIG 1000ML POUR BTL (IV SOLUTION) ×3 IMPLANT
PACK LAMINECTOMY NEURO (CUSTOM PROCEDURE TRAY) ×3 IMPLANT
PAD ARMBOARD 7.5X6 YLW CONV (MISCELLANEOUS) ×15 IMPLANT
SPONGE SURGIFOAM ABS GEL SZ50 (HEMOSTASIS) ×3 IMPLANT
STAPLER SKIN PROX WIDE 3.9 (STAPLE) IMPLANT
STRIP CLOSURE SKIN 1/2X4 (GAUZE/BANDAGES/DRESSINGS) ×10 IMPLANT
SUT PROLENE 0 CT 1 30 (SUTURE) ×3 IMPLANT
SUT VIC AB 0 CT1 18XCR BRD8 (SUTURE) ×2 IMPLANT
SUT VIC AB 0 CT1 8-18 (SUTURE) ×6
SUT VIC AB 2-0 OS6 18 (SUTURE) ×9 IMPLANT
SUT VIC AB 3-0 CP2 18 (SUTURE) ×6 IMPLANT
SWAB COLLECTION DEVICE MRSA (MISCELLANEOUS) ×3 IMPLANT
SYR 20ML ECCENTRIC (SYRINGE) ×3 IMPLANT
TOWEL OR 17X24 6PK STRL BLUE (TOWEL DISPOSABLE) ×3 IMPLANT
TOWEL OR 17X26 10 PK STRL BLUE (TOWEL DISPOSABLE) ×3 IMPLANT
TUBE ANAEROBIC SPECIMEN COL (MISCELLANEOUS) ×3 IMPLANT
WATER STERILE IRR 1000ML POUR (IV SOLUTION) ×3 IMPLANT

## 2014-07-23 NOTE — Progress Notes (Signed)
Pt arrived to 4N02 at 1442.  Pt A&O x 4, c/o 6/10 lower back surgical pain, site covered with CDI bulky gauze dressing, Hemovac charged.   Pt V/S taken, pt still on O2 from PACU, fluids running at 75 cc/hr.  Pt has voided. Pt without distress. Diet ordered, will monitor.

## 2014-07-23 NOTE — Anesthesia Postprocedure Evaluation (Signed)
  Anesthesia Post-op Note  Patient: Kevin Rogers  Procedure(s) Performed: Procedure(s): LUMBAR WOUND DEBRIDEMENT (N/A)  Patient Location: PACU  Anesthesia Type:General  Level of Consciousness: awake  Airway and Oxygen Therapy: Patient Spontanous Breathing  Post-op Pain: mild  Post-op Assessment: Post-op Vital signs reviewed  Post-op Vital Signs: Reviewed  Last Vitals:  Filed Vitals:   07/23/14 1108  BP:   Pulse: 55  Temp:   Resp: 12    Complications: No apparent anesthesia complications

## 2014-07-23 NOTE — H&P (Signed)
Kevin Rogers is an 81 y.o. male.   Chief Complaint: Partial wound dehiscence HPI: The patient is an 81 year old gentleman who had extension of a back fusion about a month ago. He was doing well his wound healed without difficulty but over the last few days he noted some separation of the wound with some serous drainage. He was seen at the office yesterday found to have a wound dehiscence of about 2 inches down to the subcutaneous tissue and possibly to the fascia. There was serous drainage but no obvious infection. It was elected at this time to bring the patient back for reopening irrigation and closure of the wound. We'll culture the wound seems infected. The patient understands the plan is with a [place] to proceed.  Past Medical History  Diagnosis Date  . Diabetes mellitus   . Hyperlipidemia   . Hypertension   . Leukemia, chronic 2001    does not take treatment just has blood levels checked once a year  . Glaucoma     left eye  . Gout   . Acute bronchitis 09/12/2007    Qualifier: Diagnosis of  By: [name] MD, [name]     Past Surgical History  Procedure Laterality Date  . Lumbar laminectomy  1997  . Hernia repair  1989  . Tonsillectomy    . Colonoscopy w/ polypectomy    . Dental surgery      implanted teeth x 2  . Anterior lat lumbar fusion Left 01/03/2013    Procedure: ANTERIOR LATERAL LUMBAR FUSION lumbar three-four;  Surgeon: [name], MD;  Location: [facility];  Service: Neurosurgery;  Laterality: Left;  . Lumbar percutaneous pedicle screw 1 level  01/03/2013    Procedure: LUMBAR PERCUTANEOUS PEDICLE SCREWS LUMBAR THREE-FOUR;  Surgeon: [name], MD;  Location: [facility];  Service: Neurosurgery;;  . Back surgery      lumbar fusion - 2016, feb    Family History  Problem Relation Age of Onset  . Heart disease Mother     92, second hand  . Heart disease Father     90, former smoker   Social History:  reports that he quit smoking about 18 years ago. His  smoking use included Cigarettes. He has a 5 pack-year smoking history. He has never used smokeless tobacco. He reports that he drinks about 0.6 oz of alcohol per week. He reports that he does not use illicit drugs.  Allergies: No Known Allergies  Medications Prior to Admission  Medication Sig Dispense Refill  . acetaminophen (TYLENOL) 500 MG tablet Take 500-1,000 mg by mouth every 6 (six) hours as needed (pain).    [name] allopurinol (ZYLOPRIM) 300 MG tablet Take 1 tablet (300 mg total) by mouth daily. (Patient taking differently: Take 300 mg by mouth at bedtime. ) 90 tablet 0  . glipiZIDE (GLUCOTROL XL) 5 MG 24 hr tablet TAKE 1 TABLET EVERY DAY 90 tablet 1  . losartan (COZAAR) 100 MG tablet Take 1 tablet (100 mg total) by mouth daily. (Patient taking differently: Take 50 mg by mouth daily. ) 90 tablet 0  . meloxicam (MOBIC) 15 MG tablet Take 1 tablet (15 mg total) by mouth daily. 90 tablet 0  . Misc Natural Products (GLUCOS-CHONDROIT-MSM COMPLEX) TABS Take 1 tablet by mouth 2 (two) times daily.    . tamsulosin (FLOMAX) 0.4 MG CAPS capsule Take 1 capsule (0.4 mg total) by mouth daily. (Patient taking differently: Take 0.4 mg by mouth at bedtime. ) 90 capsule  0  . travoprost, benzalkonium, (TRAVATAN) 0.004 % ophthalmic solution Place 1 drop into the left eye at bedtime.     . vitamin B-12 (CYANOCOBALAMIN) 1000 MCG tablet Take 1,000 mcg by mouth daily.    [name] oxyCODONE-acetaminophen (PERCOCET/ROXICET) 5-325 MG per tablet Take 1-2 tablets by mouth every 4 (four) hours as needed for moderate pain. (Patient not taking: Reported on 07/23/2014) 60 tablet 0    Results for orders placed or performed during the hospital encounter of 07/23/14 (from the past 48 hour(s))  Glucose, capillary     Status: Abnormal   Collection Time: 07/23/14  7:18 AM  Result Value Ref Range   Glucose-Capillary 133 (H) 70 - 99 mg/dL  CBC     Status: Abnormal   Collection Time: 07/23/14  7:31 AM  Result Value Ref Range   WBC 14.5  (H) 4.0 - 10.5 K/uL   RBC 3.46 (L) 4.22 - 5.81 MIL/uL   Hemoglobin 10.1 (L) 13.0 - 17.0 g/dL   HCT 31.0 (L) 39.0 - 52.0 %   MCV 89.6 78.0 - 100.0 fL   MCH 29.2 26.0 - 34.0 pg   MCHC 32.6 30.0 - 36.0 g/dL   RDW 13.3 11.5 - 15.5 %   Platelets 184 150 - 400 K/uL   No results found.  Per last admission  Blood pressure 134/82, pulse 65, temperature 98.6 F (37 C), temperature source Oral, resp. rate 20, height 6' (1.829 m), weight 108.863 kg (240 lb), SpO2 100 %.  Incision/Wound: the patient's wound has a partial dehiscence about 2 inches in length which is moderately deep with granulation tissue deep within it. Assessment/Plan Impression is that of a partial wound dehiscence. The plan is for reopening irrigation we closed of his lumbar wound with leaving drains.  [name], MD 07/23/2014, 8:19 AM

## 2014-07-23 NOTE — Op Note (Signed)
Preop diagnosis: Partial lumbar wound dehiscence Postop diagnosis: Same with lumbar seroma Procedure: Exploration and reclosure of lumbar wound Surgeon: Jaeleah Smyser After being placed the prone position the patient's back was prepped and draped in the usual sterile fashion. Previous lumbar incision was opened up both superior and inferior to the partial dehiscence. Large amounts of serous fluid were found in the superficial tissues there is also some dissection deeper. One had an open up the dorsal lumbar fashion the rest of the way evacuated all the serous fluid. We did not see any evidence of what appeared to be purulent material but we did send cultures nonetheless. We then irrigated the wound copiously with saline and antibiotic irrigation. We left a drain in the epidural space then closed the lumbar fascia with interrupted Vicryl on the left a second drain in the tissue superior to the dorsal lumbar fascia. We then closed the subcutaneous subcuticular tissues multiple layers of Vicryl and then did Dermabond and Steri-Strips on the skin. Shortness was then applied and the patient was extubated and taken to recovery in stable condition.

## 2014-07-23 NOTE — Progress Notes (Signed)
ANTIBIOTIC CONSULT NOTE - INITIAL  Pharmacy Consult for vancomycin Indication: Surgical post-operative treatment for lumbar wound w/ drain  No Known Allergies  Patient Measurements: Height: 6' (182.9 cm) Weight: 240 lb (108.863 kg) IBW/kg (Calculated) : 77.6 Vital Signs: Temp: 97.8 F (36.6 C) (03/23 1442) Temp Source: Oral (03/23 0722) BP: 115/61 mmHg (03/23 1345) Pulse Rate: 60 (03/23 1442) Intake/Output from this shift: Total I/O In: 1100 [I.V.:1100] Out: 75 [Blood:75]  Labs:  Recent Labs  07/23/14 0731  WBC 14.5*  HGB 10.1*  PLT 184  CREATININE 1.45*   Estimated Creatinine Clearance: 51.8 mL/min (by C-G formula based on Cr of 1.45). No results for input(s): VANCOTROUGH, VANCOPEAK, VANCORANDOM, GENTTROUGH, GENTPEAK, GENTRANDOM, TOBRATROUGH, TOBRAPEAK, TOBRARND, AMIKACINPEAK, AMIKACINTROU, AMIKACIN in the last 72 hours.   Microbiology: Recent Results (from the past 720 hour(s))  Surgical pcr screen     Status: None   Collection Time: 07/23/14  7:21 AM  Result Value Ref Range Status   MRSA, PCR NEGATIVE NEGATIVE Final   Staphylococcus aureus NEGATIVE NEGATIVE Final    Comment:        The Xpert SA Assay (FDA approved for NASAL specimens in patients over 22 years of age), is one component of a comprehensive surveillance program.  Test performance has been validated by [facility] for patients greater than or equal to 38 year old. It is not intended to diagnose infection nor to guide or monitor treatment.   Gram stain     Status: None   Collection Time: 07/23/14  9:06 AM  Result Value Ref Range Status   Specimen Description WOUND BACK  Final   Special Requests LUMBAR WOUND  Final   Gram Stain NO WBC SEEN NO ORGANISMS SEEN   Final   Report Status 07/23/2014 FINAL  Final    Medical History: Past Medical History  Diagnosis Date  . Diabetes mellitus   . Hyperlipidemia   . Hypertension   . Leukemia, chronic 2001    does not take treatment just has  blood levels checked once a year  . Glaucoma     left eye  . Gout   . Acute bronchitis 09/12/2007    Qualifier: Diagnosis of  By: [name] MD, [name]     Medications:  Anti-infectives    Start     Dose/Rate Route Frequency Ordered Stop   07/23/14 0938  bacitracin 50,000 Units in sodium chloride irrigation 0.9 % 500 mL irrigation  Status:  Discontinued       As needed 07/23/14 0938 07/23/14 1012   07/23/14 0832  vancomycin (VANCOCIN) 1 GM/200ML IVPB    Comments:  Gershon Crane   : cabinet override      07/23/14 7353 07/23/14 0906     Assessment: 81 year old male s/p lumbar wound debridement with a drain left in the epidural space to continue vancomycin as post-operative surgical prophylaxis.  ID: Wbc 14.5, SCr 1.45 (this is up from Feb SCr of 1.3)/ estimated CrCl ~50-2mL/min. Afebrile. Last dose of vancomycin was given at 0906 AM (after cultures were drawn).   Goal of Therapy:  Vancomycin trough level 15-20 mcg/ml  Plan:  Vancomycin 750 mg IV every 12 hours.  First dose tonight at 2100 PM.  Monitor culture results, renal function, and clinical status.   Sloan Leiter, PharmD, BCPS Clinical Pharmacist [phone]  07/23/2014,3:23 PM

## 2014-07-23 NOTE — Transfer of Care (Signed)
Immediate Anesthesia Transfer of Care Note  Patient: Kevin Rogers  Procedure(s) Performed: Procedure(s): LUMBAR WOUND DEBRIDEMENT (N/A)  Patient Location: PACU  Anesthesia Type:General  Level of Consciousness: awake, alert , oriented and patient cooperative  Airway & Oxygen Therapy: Patient Spontanous Breathing and Patient connected to nasal cannula oxygen  Post-op Assessment: Report given to RN and Post -op Vital signs reviewed and stable  Post vital signs: Reviewed and stable  Last Vitals:  Filed Vitals:   07/23/14 0722  BP: 134/82  Pulse: 65  Temp: 37 C  Resp: 20    Complications: No apparent anesthesia complications

## 2014-07-23 NOTE — Anesthesia Preprocedure Evaluation (Signed)
Anesthesia Evaluation  Patient identified by MRN, date of birth, ID band Patient awake    Airway Mallampati: II  TM Distance: >3 FB Neck ROM: Full    Dental   Pulmonary former smoker,  breath sounds clear to auscultation        Cardiovascular hypertension, Rhythm:Regular Rate:Normal     Neuro/Psych    GI/Hepatic negative GI ROS, Neg liver ROS,   Endo/Other  diabetes  Renal/GU negative Renal ROS     Musculoskeletal   Abdominal   Peds  Hematology   Anesthesia Other Findings   Reproductive/Obstetrics                             Anesthesia Physical Anesthesia Plan  ASA: III  Anesthesia Plan: General   Post-op Pain Management:    Induction: Intravenous  Airway Management Planned: Oral ETT  Additional Equipment:   Intra-op Plan:   Post-operative Plan: Extubation in OR  Informed Consent: I have reviewed the patients History and Physical, chart, labs and discussed the procedure including the risks, benefits and alternatives for the proposed anesthesia with the patient or authorized representative who has indicated his/her understanding and acceptance.   Dental advisory given  Plan Discussed with: CRNA, Anesthesiologist and Surgeon  Anesthesia Plan Comments:         Anesthesia Quick Evaluation

## 2014-07-24 LAB — GLUCOSE, CAPILLARY
Glucose-Capillary: 123 mg/dL — ABNORMAL HIGH (ref 70–99)
Glucose-Capillary: 129 mg/dL — ABNORMAL HIGH (ref 70–99)
Glucose-Capillary: 109 mg/dL — ABNORMAL HIGH (ref 70–99)
Glucose-Capillary: 126 mg/dL — ABNORMAL HIGH (ref 70–99)
Glucose-Capillary: 102 mg/dL — ABNORMAL HIGH (ref 70–99)

## 2014-07-24 LAB — POCT I-STAT 4, (NA,K, GLUC, HGB,HCT)
Glucose, Bld: 115 mg/dL — ABNORMAL HIGH (ref 70–99)
HCT: 30 % — ABNORMAL LOW (ref 39.0–52.0)
Hemoglobin: 10.2 g/dL — ABNORMAL LOW (ref 13.0–17.0)
Potassium: 4.7 mmol/L (ref 3.5–5.1)
Sodium: 143 mmol/L (ref 135–145)

## 2014-07-24 MED ORDER — SODIUM CHLORIDE 0.9 % IJ SOLN
10.0000 mL | INTRAMUSCULAR | Status: DC | PRN
  Administered 2014-07-25 (×2): 10 mL
  Filled 2014-07-24 (×2): qty 40

## 2014-07-24 NOTE — Progress Notes (Signed)
Patient ID: Kevin Rogers, male   DOB: 07/08/1931, 81 y.o.   MRN: [id number] Afeb, vss No new neuro issues Drain working well. No growth as of yet. Will get PICC today, and then plan d/c tomorrow. If positive growth, will do 8 weeks of antibiotics. If no growth, will do 2 weeks. Will get PICC placed today.

## 2014-07-24 NOTE — Progress Notes (Signed)
UR complete.  Ignace Mandigo RN, MSN 

## 2014-07-24 NOTE — Care Management Note (Unsigned)
Page 1 of 1   07/24/2014     3:10:34 PM CARE MANAGEMENT NOTE 07/24/2014  Patient:  JORDON, KRISTIANSEN   Account Number:  [id number]  Date Initiated:  07/24/2014  Documentation initiated by:  Carles Collet  Subjective/Objective Assessment:   pt was admitted with lumbar wound dehiscence, underwent wound debridement, lives at home alone,     Action/Plan:   will follow for dc needs pending pt/ot evals and md orders   Anticipated DC Date:  07/28/2014   Anticipated DC Plan:  [place]  CM consult      Choice offered to / List presented to:  C-1 Patient        [place] arranged  IV Antibiotics      [place].   Status of service:  In process, will continue to follow Medicare Important Message given?   (If response is "NO", the following Medicare IM given date fields will be blank) Date Medicare IM given:   Medicare IM given by:   Date Additional Medicare IM given:   Additional Medicare IM given by:    Discharge Disposition:    Per UR Regulation:  Reviewed for med. necessity/level of care/duration of stay  If discussed at [place] of Stay Meetings, dates discussed:    Comments:  07/24/14 11:40 [place] RN BSN CM met with pt and friend Wilford Sports at bedside to discuss [facility] IV antibx therapy, pt has chosen advanced home care, pt's friend willa [phone] will be providing support and available for teaching. pt will be discharging home to [place], [place], [place] [address]. Pam with [facility], was notified and accepted referral for discharge home tomorrow.

## 2014-07-24 NOTE — Progress Notes (Signed)
Peripherally Inserted Central Catheter/Midline Placement  The IV Nurse has discussed with the patient and/or persons authorized to consent for the patient, the purpose of this procedure and the potential benefits and risks involved with this procedure.  The benefits include less needle sticks, lab draws from the catheter and patient may be discharged home with the catheter.  Risks include, but not limited to, infection, bleeding, blood clot (thrombus formation), and puncture of an artery; nerve damage and irregular heat beat.  Alternatives to this procedure were also discussed.  PICC/Midline Placement Documentation        Kevin Rogers 07/24/2014, 5:53 PM

## 2014-07-25 ENCOUNTER — Encounter (HOSPITAL_COMMUNITY): Payer: Self-pay | Admitting: General Practice

## 2014-07-25 DIAGNOSIS — T8131XA Disruption of external operation (surgical) wound, not elsewhere classified, initial encounter: Secondary | ICD-10-CM | POA: Diagnosis not present

## 2014-07-25 DIAGNOSIS — Z452 Encounter for adjustment and management of vascular access device: Secondary | ICD-10-CM | POA: Diagnosis not present

## 2014-07-25 LAB — WOUND CULTURE
Culture: NO GROWTH
Gram Stain: NONE SEEN

## 2014-07-25 LAB — GLUCOSE, CAPILLARY
Glucose-Capillary: 123 mg/dL — ABNORMAL HIGH (ref 70–99)
Glucose-Capillary: 106 mg/dL — ABNORMAL HIGH (ref 70–99)

## 2014-07-25 MED ORDER — HEPARIN SOD (PORK) LOCK FLUSH 100 UNIT/ML IV SOLN
250.0000 [IU] | INTRAVENOUS | Status: AC | PRN
  Administered 2014-07-25: 250 [IU]

## 2014-07-25 MED ORDER — OXYCODONE-ACETAMINOPHEN 5-325 MG PO TABS: 1.0000 | ORAL_TABLET | ORAL | Status: DC | PRN

## 2014-07-25 NOTE — Progress Notes (Signed)
Pt is being discharged home. Discharge instructions were given to patient and family 

## 2014-07-25 NOTE — Progress Notes (Signed)
Patient ID: Kevin Rogers, male   DOB: 09-28-1931, 81 y.o.   MRN: [id number] Vital signs are stable. Patient is ambulatory. Would like to be discharged home. Cultures to date are negative however they will be finally read until Monday. At this point patient is on vancomycin and plan is if cultures are negative to continue vancomycin for 2 weeks.  I have remove the dressing removed the drains and placed a honeycomb dressing on his back after removing and painting the drain sites. Betadine was used.  Patient will be discharged home. He will be maintained on vancomycin. 750 mg IV twice a day will be given.

## 2014-07-25 NOTE — Discharge Summary (Signed)
Physician Discharge Summary  Patient ID: Kevin Rogers MRN: [id number] DOB/AGE: 1931-07-09 81 y.o.  Admit date: 07/23/2014 Discharge date: 07/25/2014  Admission Diagnoses: Lumbar wound dehiscence status post laminectomy 06/24/2014  Discharge Diagnoses: Lumbar wound dehiscence status post laminectomy 06/24/2014, diabetes mellitus Active Problems:   Wound dehiscence, surgical   Discharged Condition: fair  Hospital Course: Patient was admitted to undergo surgical debridement of lumbar wound that had dehisced spontaneously prior to admission. The patient underwent surgery for a decompressive laminectomy of L1-L2 and L3 on 06/24/2014. He had been doing reasonably well up until the time the wound had started draining spontaneously. The patient is to continue IV antibiotics in the form of vancomycin for at least 2 weeks time. Final wound culture result will be available in 3 more days. This can be checked as an outpatient.  Consults: None  Significant Diagnostic Studies: Lumbar wound culture negative after 48 hours.  Treatments: surgery: Debridement and closure of lumbar wound  Discharge Exam: Blood pressure 114/62, pulse 64, temperature 98.2 F (36.8 C), temperature source Oral, resp. rate 18, height 6' (1.829 m), weight 108.863 kg (240 lb), SpO2 99 %. Patient's incision appears clean and dry it is dressed with Steri-Strips. Lumbar drains have been removed. His motor function appears intact.  Disposition: 01-Home or Self Care  Discharge Instructions    Call MD for:  redness, tenderness, or signs of infection (pain, swelling, redness, odor or green/yellow discharge around incision site)    Complete by:  As directed      Call MD for:  severe uncontrolled pain    Complete by:  As directed      Call MD for:  temperature >100.4    Complete by:  As directed      Diet - low sodium heart healthy    Complete by:  As directed      Increase activity slowly    Complete by:  As directed             Medication List    TAKE these medications        acetaminophen 500 MG tablet  Commonly known as:  TYLENOL  Take 500-1,000 mg by mouth every 6 (six) hours as needed (pain).     allopurinol 300 MG tablet  Commonly known as:  ZYLOPRIM  Take 1 tablet (300 mg total) by mouth daily.     glipiZIDE 5 MG 24 hr tablet  Commonly known as:  GLUCOTROL XL  TAKE 1 TABLET EVERY DAY     GLUCOS-CHONDROIT-MSM COMPLEX Tabs  Take 1 tablet by mouth 2 (two) times daily.     losartan 100 MG tablet  Commonly known as:  COZAAR  Take 1 tablet (100 mg total) by mouth daily.     meloxicam 15 MG tablet  Commonly known as:  MOBIC  Take 1 tablet (15 mg total) by mouth daily.     oxyCODONE-acetaminophen 5-325 MG per tablet  Commonly known as:  PERCOCET/ROXICET  Take 1-2 tablets by mouth every 4 (four) hours as needed for moderate pain.     oxyCODONE-acetaminophen 5-325 MG per tablet  Commonly known as:  ROXICET  Take 1-2 tablets by mouth every 4 (four) hours as needed for severe pain.     tamsulosin 0.4 MG Caps capsule  Commonly known as:  FLOMAX  Take 1 capsule (0.4 mg total) by mouth daily.     travoprost (benzalkonium) 0.004 % ophthalmic solution  Commonly known as:  TRAVATAN  Place 1 drop into the left  eye at bedtime.     vitamin B-12 1000 MCG tablet  Commonly known as:  CYANOCOBALAMIN  Take 1,000 mcg by mouth daily.         SignedEarleen Newport 07/25/2014, 1:57 PM

## 2014-07-26 DIAGNOSIS — Z452 Encounter for adjustment and management of vascular access device: Secondary | ICD-10-CM | POA: Diagnosis not present

## 2014-07-28 ENCOUNTER — Encounter (HOSPITAL_COMMUNITY): Payer: Self-pay | Admitting: Neurosurgery

## 2014-07-28 DIAGNOSIS — Z452 Encounter for adjustment and management of vascular access device: Secondary | ICD-10-CM | POA: Diagnosis not present

## 2014-07-28 DIAGNOSIS — Z5181 Encounter for therapeutic drug level monitoring: Secondary | ICD-10-CM | POA: Diagnosis not present

## 2014-07-28 LAB — ANAEROBIC CULTURE

## 2014-07-31 ENCOUNTER — Other Ambulatory Visit: Payer: Self-pay

## 2014-07-31 DIAGNOSIS — Z452 Encounter for adjustment and management of vascular access device: Secondary | ICD-10-CM | POA: Diagnosis not present

## 2014-07-31 MED ORDER — GLIPIZIDE ER 5 MG PO TB24: 5.0000 mg | ORAL_TABLET | Freq: Every day | ORAL | Status: DC

## 2014-07-31 NOTE — Telephone Encounter (Signed)
Rx request for glipizide ER 5 mg tablet-TAke 1 tablet every day #90  Pharm:  [facility].

## 2014-08-04 DIAGNOSIS — Z5181 Encounter for therapeutic drug level monitoring: Secondary | ICD-10-CM | POA: Diagnosis not present

## 2014-08-04 DIAGNOSIS — Z452 Encounter for adjustment and management of vascular access device: Secondary | ICD-10-CM | POA: Diagnosis not present

## 2014-08-07 ENCOUNTER — Telehealth: Payer: Self-pay | Admitting: Family Medicine

## 2014-08-07 MED ORDER — GLUCOSE BLOOD VI STRP: ORAL_STRIP | Status: DC

## 2014-08-07 NOTE — Telephone Encounter (Signed)
Strips sent to [facility] [address]

## 2014-08-07 NOTE — Telephone Encounter (Signed)
Pt states he had not been checking his sugar the way he should and his test scripts have run out/expired. Pt needs new rx for the test strips.  (I do not see on list) Pt has a "free style light" meter.  Pt needs to test 1-2 x day.  [facility] [address] [place], [place]

## 2014-08-08 DIAGNOSIS — Z452 Encounter for adjustment and management of vascular access device: Secondary | ICD-10-CM | POA: Diagnosis not present

## 2014-08-09 DIAGNOSIS — T8131XA Disruption of external operation (surgical) wound, not elsewhere classified, initial encounter: Secondary | ICD-10-CM | POA: Diagnosis not present

## 2014-08-12 DIAGNOSIS — Z452 Encounter for adjustment and management of vascular access device: Secondary | ICD-10-CM | POA: Diagnosis not present

## 2014-08-13 ENCOUNTER — Other Ambulatory Visit: Payer: Self-pay | Admitting: Oncology

## 2014-08-13 ENCOUNTER — Telehealth: Payer: Self-pay | Admitting: Family Medicine

## 2014-08-13 DIAGNOSIS — C9110 Chronic lymphocytic leukemia of B-cell type not having achieved remission: Secondary | ICD-10-CM

## 2014-08-13 NOTE — Telephone Encounter (Signed)
Pt meter is on the blank and pt would like a sample meter one touch

## 2014-08-13 NOTE — Telephone Encounter (Signed)
Pt will pick up meter tomorrow

## 2014-08-13 NOTE — Telephone Encounter (Signed)
Pt was given One Touch Verio Meter.  Pt would like to test meter first then will call back if he wants strips and lancets.

## 2014-08-14 ENCOUNTER — Other Ambulatory Visit (HOSPITAL_BASED_OUTPATIENT_CLINIC_OR_DEPARTMENT_OTHER): Payer: Medicare Other

## 2014-08-14 ENCOUNTER — Ambulatory Visit: Payer: Medicare Other

## 2014-08-14 ENCOUNTER — Ambulatory Visit (HOSPITAL_BASED_OUTPATIENT_CLINIC_OR_DEPARTMENT_OTHER): Payer: Medicare Other | Admitting: Oncology

## 2014-08-14 ENCOUNTER — Other Ambulatory Visit: Payer: Medicare Other

## 2014-08-14 ENCOUNTER — Telehealth: Payer: Self-pay | Admitting: Oncology

## 2014-08-14 VITALS — BP 138/65 | HR 79 | Temp 98.0°F | Resp 19 | Ht 72.0 in | Wt 244.2 lb

## 2014-08-14 DIAGNOSIS — C9110 Chronic lymphocytic leukemia of B-cell type not having achieved remission: Secondary | ICD-10-CM

## 2014-08-14 DIAGNOSIS — D72820 Lymphocytosis (symptomatic): Secondary | ICD-10-CM

## 2014-08-14 LAB — CBC WITH DIFFERENTIAL/PLATELET
BASO%: 0.2 % (ref 0.0–2.0)
Basophils Absolute: 0 10*3/uL (ref 0.0–0.1)
EOS%: 0 % (ref 0.0–7.0)
Eosinophils Absolute: 0 10*3/uL (ref 0.0–0.5)
HCT: 36.3 % — ABNORMAL LOW (ref 38.4–49.9)
HGB: 11.4 g/dL — ABNORMAL LOW (ref 13.0–17.1)
LYMPH%: 73.5 % — ABNORMAL HIGH (ref 14.0–49.0)
MCH: 28 pg (ref 27.2–33.4)
MCHC: 31.4 g/dL — ABNORMAL LOW (ref 32.0–36.0)
MCV: 89.3 fL (ref 79.3–98.0)
MONO#: 0.3 10*3/uL (ref 0.1–0.9)
MONO%: 1.7 % (ref 0.0–14.0)
NEUT#: 3.7 10*3/uL (ref 1.5–6.5)
NEUT%: 24.6 % — ABNORMAL LOW (ref 39.0–75.0)
Platelets: 265 10*3/uL (ref 140–400)
RBC: 4.06 10*6/uL — ABNORMAL LOW (ref 4.20–5.82)
RDW: 14.1 % (ref 11.0–14.6)
WBC: 15.1 10*3/uL — ABNORMAL HIGH (ref 4.0–10.3)
lymph#: 11.1 10*3/uL — ABNORMAL HIGH (ref 0.9–3.3)

## 2014-08-14 LAB — TECHNOLOGIST REVIEW

## 2014-08-14 NOTE — Progress Notes (Signed)
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  CURRENT THERAPY: Observation and surveillance.  INTERVAL HISTORY:  Kevin Rogers 81 y.o. male  presents today for a follow-up visit. Since his last visit, he underwent a back operation for spinal stenosis in February 2016. He had a repeat operation for a a seroma and have recovered well. He has been ambulating without any major difficulty. Has not reported any falls or syncope. Has not reported any fevers or chills or sweats. His appetite is good his weight is stable. He denies any unusual bleeding/bruising, fever, chills, night sweats, dyspnea/cough, nausea, vomiting, constipation, diarrhea, abdominal pain, dysuria, urinary frequency or hematuria. The remainder review of systems unremarkable.  MEDICAL HISTORY: Past Medical History  Diagnosis Date  . Diabetes mellitus   . Hyperlipidemia   . Hypertension   . Leukemia, chronic 2001    does not take treatment just has blood levels checked once a year  . Glaucoma     left eye  . Gout   . Acute bronchitis 09/12/2007    Qualifier: Diagnosis of  By: [name] MD, [name]     IALLERGIES:  has No Known Allergies.  MEDICATIONS: has a current medication list which includes the following prescription(s): acetaminophen, allopurinol, glipizide, losartan, meloxicam, glucos-chondroit-msm complex, oxycodone-acetaminophen, oxycodone-acetaminophen, tamsulosin, travoprost (benzalkonium), and vitamin b-12.   PHYSICAL EXAMINATION: ECOG PERFORMANCE STATUS: 1  Blood pressure 138/65, pulse 79, temperature 98 F (36.7 C), temperature source Oral, resp. rate 19, height 6' (1.829 m), weight 244 lb 3.2 oz (110.768 kg).  GENERAL:alert, no distress. is wearing  back brace . SKIN: no rashes or significant lesions EYES: normal, Conjunctiva are pink and non-injected, sclera clear NECK: supple,  thyroid normal size, non-tender, without nodularity LYMPH:  no palpable lymphadenopathy in the cervical, axillary or supraclavicular LUNGS: clear to auscultation and percussion with normal breathing effort HEART: regular rate & rhythm and no murmurs and no lower extremity edema ABDOMEN:abdomen soft, non-tender and normal bowel sounds Musculoskeletal:no cyanosis of digits and no clubbing  NEURO: alert & oriented x 3 with fluent speech, no focal motor/sensory deficits  LABORATORY DATA: Results for orders placed or performed in visit on 08/14/14 (from the past 48 hour(s))  CBC with Differential/Platelet     Status: Abnormal   Collection Time: 08/14/14  1:09 PM  Result Value Ref Range   WBC 15.1 (H) 4.0 - 10.3 10e3/uL   NEUT# 3.7 1.5 - 6.5 10e3/uL   HGB 11.4 (L) 13.0 - 17.1 g/dL   HCT 36.3 (L) 38.4 - 49.9 %   Platelets 265 140 - 400 10e3/uL   MCV 89.3 79.3 - 98.0 fL   MCH 28.0 27.2 - 33.4 pg   MCHC 31.4 (L) 32.0 - 36.0 g/dL   RBC 4.06 (L) 4.20 - 5.82 10e6/uL   RDW 14.1 11.0 - 14.6 %   lymph# 11.1 (H) 0.9 - 3.3 10e3/uL   MONO# 0.3 0.1 - 0.9 10e3/uL   Eosinophils Absolute 0.0 0.0 - 0.5 10e3/uL   Basophils Absolute 0.0 0.0 - 0.1 10e3/uL   NEUT% 24.6 (L) 39.0 - 75.0 %   LYMPH% 73.5 (H) 14.0 - 49.0 %   MONO% 1.7 0.0 - 14.0 %   EOS% 0.0 0.0 - 7.0 %   BASO% 0.2 0.0 - 2.0 %       ASSESSMENT: Kevin Rogers 81 y.o. male with:   1. 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 This was initially diagnosed in 2014 although his lymphocytosis dates back to 2007. His laboratory testing today and clinical evaluation do not suggest any progression of his disease. I plan to continue active surveillance and repeat laboratory testing in 6 months. If he develops signs or symptoms of symptomatic CLL we will consider therapy. This is unlikely to happen anytime soon.  2. Recent  lumbar spine operation: Is recovering well follows by neurosurgery.  3. Follow-up: Will be in 6  months.  [facility] MD 08/14/2014

## 2014-08-14 NOTE — Telephone Encounter (Signed)
gave adn printed appt sched and avs for pt for OCT>

## 2014-08-18 ENCOUNTER — Telehealth: Payer: Self-pay | Admitting: Family Medicine

## 2014-08-18 MED ORDER — GLUCOSE BLOOD VI STRP: ORAL_STRIP | Status: DC

## 2014-08-18 MED ORDER — ONETOUCH LANCETS MISC: Status: DC

## 2014-08-18 NOTE — Telephone Encounter (Signed)
Pt received a One Touch Verio and now he need an rx for the strips and [name]

## 2014-08-18 NOTE — Telephone Encounter (Signed)
Rx sent in

## 2014-09-02 ENCOUNTER — Other Ambulatory Visit: Payer: Self-pay | Admitting: Family Medicine

## 2014-09-05 ENCOUNTER — Ambulatory Visit (INDEPENDENT_AMBULATORY_CARE_PROVIDER_SITE_OTHER): Payer: Medicare Other

## 2014-09-05 ENCOUNTER — Encounter: Payer: Self-pay | Admitting: Podiatry

## 2014-09-05 ENCOUNTER — Ambulatory Visit (INDEPENDENT_AMBULATORY_CARE_PROVIDER_SITE_OTHER): Payer: Medicare Other | Admitting: Podiatry

## 2014-09-05 VITALS — BP 122/77 | HR 77 | Resp 12

## 2014-09-05 DIAGNOSIS — B351 Tinea unguium: Secondary | ICD-10-CM | POA: Diagnosis not present

## 2014-09-05 DIAGNOSIS — M25475 Effusion, left foot: Secondary | ICD-10-CM | POA: Diagnosis not present

## 2014-09-05 DIAGNOSIS — M79676 Pain in unspecified toe(s): Secondary | ICD-10-CM

## 2014-09-05 DIAGNOSIS — E1149 Type 2 diabetes mellitus with other diabetic neurological complication: Secondary | ICD-10-CM

## 2014-09-05 DIAGNOSIS — M79672 Pain in left foot: Secondary | ICD-10-CM | POA: Diagnosis not present

## 2014-09-05 DIAGNOSIS — E1140 Type 2 diabetes mellitus with diabetic neuropathy, unspecified: Secondary | ICD-10-CM | POA: Diagnosis not present

## 2014-09-05 NOTE — Progress Notes (Signed)
   Subjective:    Patient ID: Kevin Rogers, male    DOB: [birth year], 81 y.o.   MRN: [id number]  HPI  81 year old male presents the office they with complaints of painful, thick, elongated toenails which she is admitted to trim himself. He denies any redness or drainage around the nail sites. He states that he is diabetic and his blood sugar has been "good".  He denies any history of ulceration. Denies any claudication symptoms.  He doesn't his left foot is been swollen somewhat over the last couple of months since he has had back surgery. Denies any history of injury or trauma denies any pain associated with the swelling. Denies any increase in warmth or redness.No other complaints at this time.  Review of Systems  Cardiovascular: Positive for leg swelling.  Musculoskeletal: Positive for back pain and gait problem.  Neurological: Positive for numbness.  All other systems reviewed and are negative.      Objective:   Physical Exam AAO 3, NAD DP/PT pulses palpable, CRT less than 3 seconds Protective sensation decreased with Simms Weinstein monofilament, Achilles tendon reflex intact. Nails are hypertrophic, dystrophic, elongated, brittle, discolored 10. There is no surrounding erythema or drainage on the nail sites. There is subjective tenderness on nails modified bilaterally. There is mild edema overlying the left foot and ankle without any associated erythema or increase in warmth. There is no areas of tenderness to bilateral lower extremities. There is no pain with calf compression, swelling, warmth, erythema. No open lesions or pre-ulcerative lesions are identified bilaterally.     Assessment & Plan:   81 year old male with symptomatic onychomycosis, left foot swelling. -X-rays were obtained and reviewed of the left foot. -Treatment options were discussed the patient clawing alternatives, risks, complications. -Nails sharply debrided 10 without complication/bleeding. -Recommended  patient follow-up with his primary care physician regards to swelling. -Discussed the importance of daily foot inspection.  -Follow-up with me in 3 months or sooner if any problems are to arise. In the meantime call the office with any questions, concerns, change in symptoms.

## 2014-09-06 ENCOUNTER — Encounter: Payer: Self-pay | Admitting: Podiatry

## 2014-09-09 DIAGNOSIS — M4806 Spinal stenosis, lumbar region: Secondary | ICD-10-CM | POA: Diagnosis not present

## 2014-09-09 DIAGNOSIS — Z6833 Body mass index (BMI) 33.0-33.9, adult: Secondary | ICD-10-CM | POA: Diagnosis not present

## 2014-09-16 DIAGNOSIS — H4011X1 Primary open-angle glaucoma, mild stage: Secondary | ICD-10-CM | POA: Diagnosis not present

## 2014-09-16 DIAGNOSIS — E119 Type 2 diabetes mellitus without complications: Secondary | ICD-10-CM | POA: Diagnosis not present

## 2014-09-16 LAB — HM DIABETES EYE EXAM

## 2014-10-08 DIAGNOSIS — Z6833 Body mass index (BMI) 33.0-33.9, adult: Secondary | ICD-10-CM | POA: Diagnosis not present

## 2014-10-08 DIAGNOSIS — M4806 Spinal stenosis, lumbar region: Secondary | ICD-10-CM | POA: Diagnosis not present

## 2014-11-25 DIAGNOSIS — Z6832 Body mass index (BMI) 32.0-32.9, adult: Secondary | ICD-10-CM | POA: Diagnosis not present

## 2014-11-25 DIAGNOSIS — M4806 Spinal stenosis, lumbar region: Secondary | ICD-10-CM | POA: Diagnosis not present

## 2014-12-03 ENCOUNTER — Ambulatory Visit (INDEPENDENT_AMBULATORY_CARE_PROVIDER_SITE_OTHER): Payer: Medicare Other | Admitting: Podiatry

## 2014-12-03 ENCOUNTER — Encounter: Payer: Self-pay | Admitting: Podiatry

## 2014-12-03 VITALS — BP 144/87 | HR 75 | Resp 18

## 2014-12-03 DIAGNOSIS — B351 Tinea unguium: Secondary | ICD-10-CM

## 2014-12-03 DIAGNOSIS — E1149 Type 2 diabetes mellitus with other diabetic neurological complication: Secondary | ICD-10-CM

## 2014-12-03 DIAGNOSIS — M79676 Pain in unspecified toe(s): Secondary | ICD-10-CM

## 2014-12-03 DIAGNOSIS — E1140 Type 2 diabetes mellitus with diabetic neuropathy, unspecified: Secondary | ICD-10-CM | POA: Diagnosis not present

## 2014-12-03 NOTE — Progress Notes (Signed)
Patient ID: Kevin Rogers, male   DOB: 06/29/1931, 81 y.o.   MRN: [id number] Complaint:  Visit Type: Patient returns to my office for continued preventative foot care services. Complaint: Patient states" my nails have grown long and thick and become painful to walk and wear shoes" Patient has been diagnosed with DM with neuropathy.. The patient presents for preventative foot care services. No changes to ROS  Podiatric Exam: Vascular: dorsalis pedis and posterior tibial pulses are palpable bilateral. Capillary return is immediate. Temperature gradient is WNL. Skin turgor WNL  Sensorium: Diminished Semmes Weinstein monofilament test. Normal tactile sensation bilaterally. Nail Exam: Pt has thick disfigured discolored nails with subungual debris noted bilateral entire nail hallux through fifth toenails Ulcer Exam: There is no evidence of ulcer or pre-ulcerative changes or infection. Orthopedic Exam: Muscle tone and strength are WNL. No limitations in general ROM. No crepitus or effusions noted. Foot type and digits show no abnormalities. Bony prominences are unremarkable. Skin: No Porokeratosis. No infection or ulcers  Diagnosis:  Onychomycosis, , Pain in right toe, pain in left toes  Treatment & Plan Procedures and Treatment: Consent by patient was obtained for treatment procedures. The patient understood the discussion of treatment and procedures well. All questions were answered thoroughly reviewed. Debridement of mycotic and hypertrophic toenails, 1 through 5 bilateral and clearing of subungual debris. No ulceration, no infection noted.  Return Visit-Office Procedure: Patient instructed to return to the office for a follow up visit 3 months for continued evaluation and treatment.

## 2014-12-04 ENCOUNTER — Ambulatory Visit: Payer: Medicare Other | Admitting: Podiatry

## 2014-12-30 ENCOUNTER — Other Ambulatory Visit: Payer: Self-pay | Admitting: Family Medicine

## 2015-01-13 ENCOUNTER — Ambulatory Visit (INDEPENDENT_AMBULATORY_CARE_PROVIDER_SITE_OTHER): Payer: Medicare Other | Admitting: Family Medicine

## 2015-01-13 ENCOUNTER — Encounter: Payer: Self-pay | Admitting: Family Medicine

## 2015-01-13 VITALS — BP 126/70 | HR 73 | Temp 98.0°F | Wt 241.0 lb

## 2015-01-13 DIAGNOSIS — N183 Chronic kidney disease, stage 3 unspecified: Secondary | ICD-10-CM

## 2015-01-13 DIAGNOSIS — I10 Essential (primary) hypertension: Secondary | ICD-10-CM | POA: Diagnosis not present

## 2015-01-13 DIAGNOSIS — Z23 Encounter for immunization: Secondary | ICD-10-CM

## 2015-01-13 DIAGNOSIS — E119 Type 2 diabetes mellitus without complications: Secondary | ICD-10-CM

## 2015-01-13 LAB — HEMOGLOBIN A1C: Hgb A1c MFr Bld: 5.6 % (ref 4.6–6.5)

## 2015-01-13 LAB — BASIC METABOLIC PANEL
BUN: 12 mg/dL (ref 6–23)
CO2: 29 mEq/L (ref 19–32)
Calcium: 9.6 mg/dL (ref 8.4–10.5)
Chloride: 108 mEq/L (ref 96–112)
Creatinine, Ser: 1.27 mg/dL (ref 0.40–1.50)
GFR: 70.02 mL/min (ref >60.00)
Glucose, Bld: 121 mg/dL — ABNORMAL HIGH (ref 70–99)
Potassium: 4.4 mEq/L (ref 3.5–5.1)
Sodium: 144 mEq/L (ref 135–145)

## 2015-01-13 NOTE — Progress Notes (Signed)
Garret Reddish, MD  Subjective:  Kevin Rogers is a 81 y.o. year old very pleasant male patient who presents for/with See problem oriented charting ROS- no chest pain or shortness of breath. No hypoglycemia. No headache. No worsening fatigue  Past Medical History-  Patient Active Problem List   Diagnosis Date Noted  . Chronic lymphocytic leukemia 03/08/2007    Priority: High  . Diabetes mellitus type II, controlled 03/08/2007    Priority: High  . CKD (chronic kidney disease), stage III 01/13/2015    Priority: Medium  . BPH (benign prostatic hyperplasia) 06/24/2014    Priority: Medium  . Gout 03/08/2007    Priority: Medium  . Essential hypertension 03/08/2007    Priority: Medium  . Lumbar spinal stenosis 06/26/2014    Priority: Low  . B12 deficiency 12/12/2008    Priority: Low  . Diabetic polyneuropathy 12/12/2008    Priority: Low  . ERECTILE DYSFUNCTION 03/18/2008    Priority: Low  . Glaucoma 03/08/2007    Priority: Low  . Osteoarthritis 03/08/2007    Priority: Low    Medications- reviewed and updated Current Outpatient Prescriptions  Medication Sig Dispense Refill  . acetaminophen (TYLENOL) 500 MG tablet Take 500-1,000 mg by mouth every 6 (six) hours as needed (pain).    [name] allopurinol (ZYLOPRIM) 300 MG tablet Take 1 tablet by mouth  daily 90 tablet 2  . glipiZIDE (GLUCOTROL XL) 5 MG 24 hr tablet Take 1 tablet (5 mg total) by mouth daily. 90 tablet 2  . glucose blood (ONETOUCH VERIO) test strip Use to test blood sugars daily. Dx: E11.9 100 each 12  . losartan (COZAAR) 100 MG tablet Take 1 tablet by mouth  daily 90 tablet 1  . meloxicam (MOBIC) 15 MG tablet Take 1 tablet by mouth  daily 90 tablet 1  . Misc Natural Products (GLUCOS-CHONDROIT-MSM COMPLEX) TABS Take 1 tablet by mouth 2 (two) times daily.    . ONE TOUCH LANCETS MISC Use to test blood sugars. Dx:E11.9 100 each 11  . tamsulosin (FLOMAX) 0.4 MG CAPS capsule Take 1 capsule by mouth  daily 90 capsule 1  . TRAVATAN  Z 0.004 % SOLN ophthalmic solution     . travoprost, benzalkonium, (TRAVATAN) 0.004 % ophthalmic solution Place 1 drop into the left eye at bedtime.     . vitamin B-12 (CYANOCOBALAMIN) 1000 MCG tablet Take 1,000 mcg by mouth daily.    [name] oxyCODONE-acetaminophen (PERCOCET/ROXICET) 5-325 MG per tablet Take 1-2 tablets by mouth every 4 (four) hours as needed for moderate pain. (Patient not taking: Reported on 01/13/2015) 60 tablet 0   No current facility-administered medications for this visit.    Objective: BP 126/70 mmHg  Pulse 73  Temp(Src) 98 F (36.7 C)  Wt 241 lb (109.317 kg) Gen: NAD, resting comfortably CV: RRR no murmurs rubs or gallops Lungs: CTAB no crackles, wheeze, rhonchi Abdomen: soft/nontender/nondistended/normal bowel sounds. No rebound or guarding.  Ext: Trace edema L>R. Left leg actually smaller than R at 41 L and 40 cm R leg. No calf tenderness.  Skin: warm, dry Neuro: grossly normal, moves all extremities. Walks with 2 canes  Assessment/Plan:  Diabetes mellitus type II, controlled S: controlled. On glipizide 5 mg. No hypoglycemia. Lab Results  Component Value Date   HGBA1C 5.9 06/24/2014  A/P:Continue current meds as long as A1c less than 7 and no hypoglycemia   Essential hypertension S: controlled. On Losartan 100mg  (1/2 tab) BP Readings from Last 3 Encounters:  01/13/15 126/70  12/03/14 144/87  09/05/14 122/77  A/P:Continue current meds:   CKD (chronic kidney disease), stage III S: Stable with GFR 50-60 range. He is already on losartan if there were proteinuric component A/P: Check bmet today. His meloxicam really helps him. His meloxicam really helps him. We discussed if GFR is below 45 we would certainly stop it.  6 months  Orders Placed This Encounter  Procedures  . Flu Vaccine QUAD 36+ mos IM  . Hemoglobin A1c    [place]  . Basic metabolic panel    [place]

## 2015-01-13 NOTE — Assessment & Plan Note (Signed)
S: controlled. On glipizide 5 mg. No hypoglycemia. Lab Results  Component Value Date   HGBA1C 5.9 06/24/2014  A/P:Continue current meds as long as A1c less than 7 and no hypoglycemia

## 2015-01-13 NOTE — Assessment & Plan Note (Signed)
S: Stable with GFR 50-60 range. He is already on losartan if there were proteinuric component A/P: Check bmet today. His meloxicam really helps him. His meloxicam really helps him. We discussed if GFR is below 45 we would certainly stop it.

## 2015-01-13 NOTE — Assessment & Plan Note (Signed)
S: controlled. On Losartan 100mg  (1/2 tab) BP Readings from Last 3 Encounters:  01/13/15 126/70  12/03/14 144/87  09/05/14 122/77  A/P:Continue current meds:

## 2015-01-13 NOTE — Patient Instructions (Signed)
Medication Instructions:  No changes unless labs lead [place] to change  Other Instructions:  Blood pressure looks great Suspect diabetes is doing well- if you ever have low blood sugars please let us know  Labwork: a1c and kidney function before you leave  Testing/Procedures/Immunizations: Advise a flu shot before end of November Health Maintenance Due  Topic Date Due  . OPHTHALMOLOGY EXAM - Sign release of information at the front desk for records from eye doctor to be sent to [place] 09/18/2014  . INFLUENZA VACCINE - before you leave 12/01/2014  . HEMOGLOBIN A1C - today 12/23/2014   Follow-Up (all visit scheduling, rescheduling, cancellations including labs should be scheduled at front desk): Schedule 6 month physical with me up front.   Sooner if you need [place] or if you have new or worsening symptoms

## 2015-01-14 DIAGNOSIS — Z6832 Body mass index (BMI) 32.0-32.9, adult: Secondary | ICD-10-CM | POA: Diagnosis not present

## 2015-01-14 DIAGNOSIS — M4806 Spinal stenosis, lumbar region: Secondary | ICD-10-CM | POA: Diagnosis not present

## 2015-01-14 DIAGNOSIS — I10 Essential (primary) hypertension: Secondary | ICD-10-CM | POA: Diagnosis not present

## 2015-01-26 ENCOUNTER — Emergency Department (HOSPITAL_COMMUNITY): Payer: Medicare Other

## 2015-01-26 ENCOUNTER — Encounter (HOSPITAL_COMMUNITY): Payer: Self-pay | Admitting: Emergency Medicine

## 2015-01-26 ENCOUNTER — Inpatient Hospital Stay (HOSPITAL_COMMUNITY)
Admission: EM | Admit: 2015-01-26 | Discharge: 2015-02-04 | DRG: 872 | Disposition: A | Discharge status: Skilled Nursing Facility | Payer: Medicare Other | Attending: Internal Medicine | Admitting: Internal Medicine

## 2015-01-26 DIAGNOSIS — Z981 Arthrodesis status: Secondary | ICD-10-CM | POA: Diagnosis not present

## 2015-01-26 DIAGNOSIS — Z791 Long term (current) use of non-steroidal anti-inflammatories (NSAID): Secondary | ICD-10-CM

## 2015-01-26 DIAGNOSIS — M7989 Other specified soft tissue disorders: Secondary | ICD-10-CM | POA: Diagnosis present

## 2015-01-26 DIAGNOSIS — Z7984 Long term (current) use of oral hypoglycemic drugs: Secondary | ICD-10-CM | POA: Diagnosis not present

## 2015-01-26 DIAGNOSIS — M4646 Discitis, unspecified, lumbar region: Secondary | ICD-10-CM | POA: Diagnosis not present

## 2015-01-26 DIAGNOSIS — M109 Gout, unspecified: Secondary | ICD-10-CM | POA: Diagnosis present

## 2015-01-26 DIAGNOSIS — N183 Chronic kidney disease, stage 3 unspecified: Secondary | ICD-10-CM | POA: Diagnosis present

## 2015-01-26 DIAGNOSIS — M4806 Spinal stenosis, lumbar region: Secondary | ICD-10-CM | POA: Diagnosis not present

## 2015-01-26 DIAGNOSIS — M5489 Other dorsalgia: Secondary | ICD-10-CM | POA: Diagnosis not present

## 2015-01-26 DIAGNOSIS — I10 Essential (primary) hypertension: Secondary | ICD-10-CM | POA: Diagnosis present

## 2015-01-26 DIAGNOSIS — N182 Chronic kidney disease, stage 2 (mild): Secondary | ICD-10-CM

## 2015-01-26 DIAGNOSIS — E1142 Type 2 diabetes mellitus with diabetic polyneuropathy: Secondary | ICD-10-CM | POA: Diagnosis not present

## 2015-01-26 DIAGNOSIS — M48061 Spinal stenosis, lumbar region without neurogenic claudication: Secondary | ICD-10-CM | POA: Diagnosis present

## 2015-01-26 DIAGNOSIS — R6510 Systemic inflammatory response syndrome (SIRS) of non-infectious origin without acute organ dysfunction: Secondary | ICD-10-CM | POA: Diagnosis not present

## 2015-01-26 DIAGNOSIS — Z8249 Family history of ischemic heart disease and other diseases of the circulatory system: Secondary | ICD-10-CM | POA: Diagnosis not present

## 2015-01-26 DIAGNOSIS — M549 Dorsalgia, unspecified: Secondary | ICD-10-CM | POA: Diagnosis not present

## 2015-01-26 DIAGNOSIS — A419 Sepsis, unspecified organism: Secondary | ICD-10-CM | POA: Diagnosis not present

## 2015-01-26 DIAGNOSIS — E1122 Type 2 diabetes mellitus with diabetic chronic kidney disease: Secondary | ICD-10-CM

## 2015-01-26 DIAGNOSIS — R509 Fever, unspecified: Secondary | ICD-10-CM

## 2015-01-26 DIAGNOSIS — E119 Type 2 diabetes mellitus without complications: Secondary | ICD-10-CM

## 2015-01-26 DIAGNOSIS — B9689 Other specified bacterial agents as the cause of diseases classified elsewhere: Secondary | ICD-10-CM | POA: Diagnosis not present

## 2015-01-26 DIAGNOSIS — Z87891 Personal history of nicotine dependence: Secondary | ICD-10-CM | POA: Diagnosis not present

## 2015-01-26 DIAGNOSIS — M545 Low back pain: Secondary | ICD-10-CM | POA: Diagnosis not present

## 2015-01-26 DIAGNOSIS — H409 Unspecified glaucoma: Secondary | ICD-10-CM | POA: Diagnosis present

## 2015-01-26 DIAGNOSIS — M4626 Osteomyelitis of vertebra, lumbar region: Secondary | ICD-10-CM | POA: Diagnosis present

## 2015-01-26 DIAGNOSIS — C9110 Chronic lymphocytic leukemia of B-cell type not having achieved remission: Secondary | ICD-10-CM | POA: Diagnosis present

## 2015-01-26 DIAGNOSIS — N400 Enlarged prostate without lower urinary tract symptoms: Secondary | ICD-10-CM | POA: Diagnosis present

## 2015-01-26 DIAGNOSIS — E538 Deficiency of other specified B group vitamins: Secondary | ICD-10-CM | POA: Diagnosis not present

## 2015-01-26 DIAGNOSIS — E785 Hyperlipidemia, unspecified: Secondary | ICD-10-CM | POA: Diagnosis not present

## 2015-01-26 DIAGNOSIS — S32028A Other fracture of second lumbar vertebra, initial encounter for closed fracture: Secondary | ICD-10-CM | POA: Diagnosis not present

## 2015-01-26 DIAGNOSIS — S32018A Other fracture of first lumbar vertebra, initial encounter for closed fracture: Secondary | ICD-10-CM | POA: Diagnosis not present

## 2015-01-26 DIAGNOSIS — Z8739 Personal history of other diseases of the musculoskeletal system and connective tissue: Secondary | ICD-10-CM | POA: Insufficient documentation

## 2015-01-26 HISTORY — DX: Type 2 diabetes mellitus with unspecified complications: E11.8

## 2015-01-26 LAB — COMPREHENSIVE METABOLIC PANEL
ALT: 15 U/L — ABNORMAL LOW (ref 17–63)
AST: 24 U/L (ref 15–41)
Albumin: 3.6 g/dL (ref 3.5–5.0)
Alkaline Phosphatase: 89 U/L (ref 38–126)
Anion gap: 11 (ref 5–15)
BUN: 13 mg/dL (ref 6–20)
CO2: 25 mmol/L (ref 22–32)
Calcium: 9.8 mg/dL (ref 8.9–10.3)
Chloride: 101 mmol/L (ref 101–111)
Creatinine, Ser: 1.41 mg/dL — ABNORMAL HIGH (ref 0.61–1.24)
GFR calc Af Amer: 53 mL/min — ABNORMAL LOW (ref >60)
GFR calc non Af Amer: 45 mL/min — ABNORMAL LOW (ref >60)
Glucose, Bld: 184 mg/dL — ABNORMAL HIGH (ref 65–99)
Potassium: 4.4 mmol/L (ref 3.5–5.1)
Sodium: 137 mmol/L (ref 135–145)
Total Bilirubin: 0.8 mg/dL (ref 0.3–1.2)
Total Protein: 7.5 g/dL (ref 6.5–8.1)

## 2015-01-26 LAB — URINALYSIS, ROUTINE W REFLEX MICROSCOPIC
Bilirubin Urine: NEGATIVE
Glucose, UA: NEGATIVE mg/dL
Hgb urine dipstick: NEGATIVE
Ketones, ur: NEGATIVE mg/dL
Leukocytes, UA: NEGATIVE
Nitrite: NEGATIVE
Protein, ur: NEGATIVE mg/dL
Specific Gravity, Urine: 1.014 (ref 1.005–1.030)
Urobilinogen, UA: 1 mg/dL (ref 0.0–1.0)
pH: 6 (ref 5.0–8.0)

## 2015-01-26 LAB — CBC WITH DIFFERENTIAL/PLATELET
Basophils Absolute: 0 10*3/uL (ref 0.0–0.1)
Basophils Relative: 0 %
Eosinophils Absolute: 0 10*3/uL (ref 0.0–0.7)
Eosinophils Relative: 0 %
HCT: 38.3 % — ABNORMAL LOW (ref 39.0–52.0)
Hemoglobin: 12.8 g/dL — ABNORMAL LOW (ref 13.0–17.0)
Lymphocytes Relative: 55 %
Lymphs Abs: 8 10*3/uL — ABNORMAL HIGH (ref 0.7–4.0)
MCH: 28.3 pg (ref 26.0–34.0)
MCHC: 33.4 g/dL (ref 30.0–36.0)
MCV: 84.7 fL (ref 78.0–100.0)
Monocytes Absolute: 0.3 10*3/uL (ref 0.1–1.0)
Monocytes Relative: 2 %
Neutro Abs: 6.3 10*3/uL (ref 1.7–7.7)
Neutrophils Relative %: 43 %
Platelets: 298 10*3/uL (ref 150–400)
RBC: 4.52 MIL/uL (ref 4.22–5.81)
RDW: 15.3 % (ref 11.5–15.5)
WBC: 14.6 10*3/uL — ABNORMAL HIGH (ref 4.0–10.5)

## 2015-01-26 LAB — I-STAT CG4 LACTIC ACID, ED: Lactic Acid, Venous: 2.21 mmol/L (ref 0.5–2.0)

## 2015-01-26 MED ORDER — SODIUM CHLORIDE 0.9 % IV BOLUS (SEPSIS)
1000.0000 mL | Freq: Once | INTRAVENOUS | Status: AC
  Administered 2015-01-26: 1000 mL via INTRAVENOUS

## 2015-01-26 MED ORDER — ACETAMINOPHEN 325 MG PO TABS
650.0000 mg | ORAL_TABLET | Freq: Once | ORAL | Status: AC
  Administered 2015-01-26: 650 mg via ORAL

## 2015-01-26 MED ORDER — KETOROLAC TROMETHAMINE 30 MG/ML IJ SOLN
30.0000 mg | Freq: Once | INTRAMUSCULAR | Status: AC
  Administered 2015-01-26: 30 mg via INTRAVENOUS
  Filled 2015-01-26: qty 1

## 2015-01-26 MED ORDER — FENTANYL CITRATE (PF) 100 MCG/2ML IJ SOLN
50.0000 ug | Freq: Once | INTRAMUSCULAR | Status: AC
  Administered 2015-01-26: 50 ug via INTRAVENOUS
  Filled 2015-01-26: qty 2

## 2015-01-26 NOTE — ED Notes (Signed)
Made aware of patient's VS by NP, d/t hx, HR, oral temp, pt increased to acuity 2 and called code sepsis at recommendation of PA, see new orders.

## 2015-01-26 NOTE — ED Notes (Signed)
Watch given to family member

## 2015-01-26 NOTE — ED Notes (Signed)
NS fluid bolus started at this time.

## 2015-01-26 NOTE — ED Notes (Signed)
Patient transported to X-ray 

## 2015-01-26 NOTE — ED Provider Notes (Signed)
CSN: [id number]     Arrival date & time 01/26/15  1945 History  By signing my name below, I, Kevin Rogers, attest that this documentation has been prepared under the direction and in the presence of [name], MD. Electronically Signed: [name], ED Scribe. 01/26/2015. 11:03 PM.     Chief Complaint  Patient presents with  . Back Pain    The patient said he has chronic back pain and he started having severe back pain.  He is supposed to have a procedure tomorrow and he could not stand the pain anymore.     Patient is a 81 y.o. male presenting with fever. The history is provided by the patient. No language interpreter was used.  Fever Temp source:  Oral Severity:  Moderate Onset quality:  Sudden Timing:  Constant Progression:  Unchanged Chronicity:  New Relieved by:  Nothing Worsened by:  Nothing tried Ineffective treatments:  None tried Associated symptoms: chills   Associated symptoms: no cough, no diarrhea and no sore throat   Risk factors: hx of cancer    HPI Comments: Kevin Rogers is a 81 y.o. male brought in by ambulance, who presents to the Emergency Department complaining of acute on chronic back pain onset 2 days prior. Pt reports having back surgery in February 2016. Pt states that the pain is worse when moving. Pt states that he is suppose to get injections in his back tomorrow. Pt states that he has had chills and fever today as well. Pt doesn't report any alleviating factors. Denies cough, sore throat, abdominal pain, diarrhea , arthralgias or other related symptoms.    Past Medical History  Diagnosis Date  . Diabetes mellitus   . Hyperlipidemia   . Hypertension   . Leukemia, chronic 2001    does not take treatment just has blood levels checked once a year  . Glaucoma     left eye  . Gout   . Acute bronchitis 09/12/2007    Qualifier: Diagnosis of  By: [name] MD, [name]    Past Surgical History  Procedure Laterality Date  . Lumbar laminectomy  1997   . Hernia repair  1989  . Tonsillectomy    . Colonoscopy w/ polypectomy    . Dental surgery      implanted teeth x 2  . Anterior lat lumbar fusion Left 01/03/2013    Procedure: ANTERIOR LATERAL LUMBAR FUSION lumbar three-four;  Surgeon: [name], MD;  Location: [place] [facility];  Service: Neurosurgery;  Laterality: Left;  . Lumbar percutaneous pedicle screw 1 level  01/03/2013    Procedure: LUMBAR PERCUTANEOUS PEDICLE SCREWS LUMBAR THREE-FOUR;  Surgeon: [name], MD;  Location: [facility];  Service: Neurosurgery;;  . Back surgery      lumbar fusion - 2016, feb  . Lumbar wound debridement N/A 07/23/2014    Procedure: LUMBAR WOUND DEBRIDEMENT;  Surgeon: Kevin Chimera, MD;  Location: [place] [facility];  Service: Neurosurgery;  Laterality: N/A;   Family History  Problem Relation Age of Onset  . Heart disease Mother     36, second hand  . Heart disease Father     81, former smoker   Social History  Substance Use Topics  . Smoking status: Former Smoker -- 0.25 packs/day for 20 years    Types: Cigarettes    Quit date: 09/28/1995  . Smokeless tobacco: Never Used  . Alcohol Use: 0.6 oz/week    1 Glasses of wine per week     Comment:  monthy    Review of Systems  Constitutional: Positive for fever and chills.  HENT: Negative for sore throat.   Respiratory: Negative for cough.   Gastrointestinal: Negative for abdominal pain and diarrhea.  Musculoskeletal: Positive for back pain. Negative for arthralgias.  All other systems reviewed and are negative.    Allergies  Review of patient's allergies indicates no known allergies.  Home Medications   Prior to Admission medications   Medication Sig Start Date End Date Taking? Authorizing Provider  acetaminophen (TYLENOL) 500 MG tablet Take 500-1,000 mg by mouth every 6 (six) hours as needed (pain).   Yes Historical Provider, MD  allopurinol (ZYLOPRIM) 300 MG tablet Take 1 tablet by mouth  daily 12/30/14  Yes [name], MD   glipiZIDE (GLUCOTROL XL) 5 MG 24 hr tablet Take 1 tablet (5 mg total) by mouth daily. 07/31/14  Yes [name], MD  losartan (COZAAR) 100 MG tablet Take 1 tablet by mouth  daily 09/02/14  Yes [name], MD  meloxicam [facility]) 15 MG tablet Take 1 tablet by mouth  daily 09/02/14  Yes [name], MD  nabumetone (RELAFEN) 500 MG tablet Take 500 mg by mouth 2 (two) times daily as needed. Stopped for procedure 01/13/15  Yes Historical Provider, MD  tamsulosin (FLOMAX) 0.4 MG CAPS capsule Take 1 capsule by mouth  daily 09/02/14  Yes [name], MD  TRAVATAN Z 0.004 % SOLN ophthalmic solution Place 1 drop into both eyes at bedtime.  09/03/14  Yes Historical Provider, MD  vitamin B-12 (CYANOCOBALAMIN) 1000 MCG tablet Take 1,000 mcg by mouth daily.   Yes Historical Provider, MD  glucose blood (ONETOUCH VERIO) test strip Use to test blood sugars daily. Dx: E11.9 08/18/14   [name], MD  ONE TOUCH LANCETS MISC Use to test blood sugars. Dx:E11.9 08/18/14   [name], MD  oxyCODONE-acetaminophen (PERCOCET/ROXICET) 5-325 MG per tablet Take 1-2 tablets by mouth every 4 (four) hours as needed for moderate pain. Patient not taking: Reported on 01/13/2015 06/28/14   Consuella Lose, MD   BP 146/79 mmHg  Pulse 93  Temp(Src) 102.5 F (39.2 C) (Oral)  Resp 20  Ht 6' (1.829 m)  Wt 232 lb 9 oz (105.49 kg)  BMI 31.53 kg/m2  SpO2 97%   Physical Exam  Constitutional: He is oriented to person, place, and time. He appears well-developed and well-nourished. No distress.  HENT:  Head: Normocephalic and atraumatic.  Mouth/Throat: Oropharynx is clear and moist.  Eyes: Conjunctivae and EOM are normal. Pupils are equal, round, and reactive to light.  Neck: Normal range of motion. Neck supple. No tracheal deviation present.  Cardiovascular: Normal rate, regular rhythm and normal heart sounds.   Pulmonary/Chest: Effort normal. No respiratory distress. He has no wheezes. He has no rales.   Abdominal: Soft. Bowel sounds are normal. There is no tenderness. There is no rebound and no guarding.  Musculoskeletal: Normal range of motion.  Low back incision clean dry and intact no warmth erythema or fluctuance.   Neurological: He is alert and oriented to person, place, and time.  Reflex Scores:      Patellar reflexes are 2+ on the right side and 2+ on the left side. Skin: Skin is warm and dry.  Psychiatric: He has a normal mood and affect. His behavior is normal.  Nursing note and vitals reviewed.   ED Course  Procedures (including critical care time) DIAGNOSTIC STUDIES: Oxygen Saturation is 97% on RA, normal  by my interpretation.    COORDINATION OF CARE: 11:13 PM-Discussed treatment plan with pt at bedside and pt agreed to plan.     Labs Review Labs Reviewed  COMPREHENSIVE METABOLIC PANEL - Abnormal; Notable for the following:    Glucose, Bld 184 (*)    Creatinine, Ser 1.41 (*)    ALT 15 (*)    GFR calc non Af Amer 45 (*)    GFR calc Af Amer 53 (*)    All other components within normal limits  CBC WITH DIFFERENTIAL/PLATELET - Abnormal; Notable for the following:    WBC 14.6 (*)    Hemoglobin 12.8 (*)    HCT 38.3 (*)    Lymphs Abs 8.0 (*)    All other components within normal limits  I-STAT CG4 LACTIC ACID, ED - Abnormal; Notable for the following:    Lactic Acid, Venous 2.21 (*)    All other components within normal limits  CULTURE, BLOOD (ROUTINE X 2)  CULTURE, BLOOD (ROUTINE X 2)  URINE CULTURE  URINALYSIS, ROUTINE W REFLEX MICROSCOPIC (NOT AT [facility])    Imaging Review Dg Chest 2 View  01/26/2015   CLINICAL DATA:  Back pain tonight with fever.  Recent back surgery  EXAM: CHEST  2 VIEW  COMPARISON:  January 01, 2013  FINDINGS: The heart size and mediastinal contours are within normal limits. The aorta is tortuous. There is no focal infiltrate, pulmonary edema, or pleural effusion. The visualized skeletal structures are unremarkable.  IMPRESSION: No active  cardiopulmonary disease.   Electronically Signed   By: Abelardo Diesel M.D.   On: 01/26/2015 22:35   I have personally reviewed and evaluated these images and lab results as part of my medical decision-making.   EKG Interpretation None      MDM   Final diagnoses:  None   Results for orders placed or performed during the hospital encounter of 01/26/15  Comprehensive metabolic panel  Result Value Ref Range   Sodium 137 135 - 145 mmol/L   Potassium 4.4 3.5 - 5.1 mmol/L   Chloride 101 101 - 111 mmol/L   CO2 25 22 - 32 mmol/L   Glucose, Bld 184 (H) 65 - 99 mg/dL   BUN 13 6 - 20 mg/dL   Creatinine, Ser 1.41 (H) 0.61 - 1.24 mg/dL   Calcium 9.8 8.9 - 10.3 mg/dL   Total Protein 7.5 6.5 - 8.1 g/dL   Albumin 3.6 3.5 - 5.0 g/dL   AST 24 15 - 41 U/L   ALT 15 (L) 17 - 63 U/L   Alkaline Phosphatase 89 38 - 126 U/L   Total Bilirubin 0.8 0.3 - 1.2 mg/dL   GFR calc non Af Amer 45 (L) >60 mL/min   GFR calc Af Amer 53 (L) >60 mL/min   Anion gap 11 5 - 15  CBC with Differential  Result Value Ref Range   WBC 14.6 (H) 4.0 - 10.5 K/uL   RBC 4.52 4.22 - 5.81 MIL/uL   Hemoglobin 12.8 (L) 13.0 - 17.0 g/dL   HCT 38.3 (L) 39.0 - 52.0 %   MCV 84.7 78.0 - 100.0 fL   MCH 28.3 26.0 - 34.0 pg   MCHC 33.4 30.0 - 36.0 g/dL   RDW 15.3 11.5 - 15.5 %   Platelets 298 150 - 400 K/uL   Neutrophils Relative % 43 %   Lymphocytes Relative 55 %   Monocytes Relative 2 %   Eosinophils Relative 0 %   Basophils Relative 0 %  Neutro Abs 6.3 1.7 - 7.7 K/uL   Lymphs Abs 8.0 (H) 0.7 - 4.0 K/uL   Monocytes Absolute 0.3 0.1 - 1.0 K/uL   Eosinophils Absolute 0.0 0.0 - 0.7 K/uL   Basophils Absolute 0.0 0.0 - 0.1 K/uL   RBC Morphology TARGET CELLS   Urinalysis, Routine w reflex microscopic (not at [facility])  Result Value Ref Range   Color, Urine YELLOW YELLOW   APPearance CLEAR CLEAR   Specific Gravity, Urine 1.014 1.005 - 1.030   pH 6.0 5.0 - 8.0   Glucose, UA NEGATIVE NEGATIVE mg/dL   Hgb urine dipstick NEGATIVE  NEGATIVE   Bilirubin Urine NEGATIVE NEGATIVE   Ketones, ur NEGATIVE NEGATIVE mg/dL   Protein, ur NEGATIVE NEGATIVE mg/dL   Urobilinogen, UA 1.0 0.0 - 1.0 mg/dL   Nitrite NEGATIVE NEGATIVE   Leukocytes, UA NEGATIVE NEGATIVE  I-Stat CG4 Lactic Acid, ED (Not at [facility])  Result Value Ref Range   Lactic Acid, Venous 2.21 (HH) 0.5 - 2.0 mmol/L   Comment NOTIFIED PHYSICIAN   I-Stat CG4 Lactic Acid, ED (Not at [facility])  Result Value Ref Range   Lactic Acid, Venous 0.82 0.5 - 2.0 mmol/L   Dg Chest 2 View  01/26/2015   CLINICAL DATA:  Back pain tonight with fever.  Recent back surgery  EXAM: CHEST  2 VIEW  COMPARISON:  January 01, 2013  FINDINGS: The heart size and mediastinal contours are within normal limits. The aorta is tortuous. There is no focal infiltrate, pulmonary edema, or pleural effusion. The visualized skeletal structures are unremarkable.  IMPRESSION: No active cardiopulmonary disease.   Electronically Signed   By: Abelardo Diesel M.D.   On: 01/26/2015 22:35   Dg Lumbar Spine Complete  01/27/2015   CLINICAL DATA:  Lower back pain.  Recent back surgery.  EXAM: LUMBAR SPINE - COMPLETE 4+ VIEW  COMPARISON:  09/09/2014  FINDINGS: Recent L1-2 and L2-3 posterior lumbar interbody fusion with rod and pedicle screw fixation. Since comparison radiograph, there has been superior endplate height loss with narrowing between the endplates and pedicle screws. No hardware fracture or displacement. Intervertebral cages are located, including pre-existing cage at the L3-4 level.  Disc and facet degeneration is severe and diffuse, including at T12-L1 where there is chronic endplate erosion.  IMPRESSION: 1. L1 and L2 superior endplate fractures which have developed since 09/09/2014. 2. L1-2 thru L3-4 PLIF with posterior fixation hardware from L1-L3. No hardware displacement.   Electronically Signed   By: Monte Fantasia M.D.   On: 01/27/2015 00:07    Medications  acetaminophen (TYLENOL) tablet 650 mg (not  administered)  piperacillin-tazobactam (ZOSYN) IVPB 3.375 g (not administered)  allopurinol (ZYLOPRIM) tablet 300 mg (not administered)  latanoprost (XALATAN) 0.005 % ophthalmic solution 1 drop (not administered)  tamsulosin (FLOMAX) capsule 0.4 mg (not administered)  vitamin B-12 (CYANOCOBALAMIN) tablet 1,000 mcg (not administered)  oxyCODONE-acetaminophen (PERCOCET/ROXICET) 5-325 MG per tablet 1-2 tablet (not administered)  hydrALAZINE (APRESOLINE) injection 5 mg (not administered)  morphine 2 MG/ML injection 2 mg (not administered)  insulin aspart (novoLOG) injection 0-9 Units (not administered)  0.9 %  sodium chloride infusion (not administered)  heparin injection 5,000 Units (not administered)  sodium chloride 0.9 % injection 3 mL (not administered)  ondansetron (ZOFRAN) tablet 4 mg (not administered)    Or  ondansetron (ZOFRAN) injection 4 mg (not administered)  acetaminophen (TYLENOL) tablet 650 mg (650 mg Oral Given 01/26/15 2138)  fentaNYL (SUBLIMAZE) injection 50 mcg (50 mcg Intravenous Given 01/26/15 2204)  sodium  chloride 0.9 % bolus 1,000 mL (0 mLs Intravenous Stopped 01/26/15 2250)  sodium chloride 0.9 % bolus 1,000 mL (0 mLs Intravenous Stopped 01/26/15 2203)  ketorolac (TORADOL) 30 MG/ML injection 30 mg (30 mg Intravenous Given 01/26/15 2326)  vancomycin (VANCOCIN) IVPB 1000 mg/200 mL premix (1,000 mg Intravenous New Bag/Given 01/27/15 0042)  piperacillin-tazobactam (ZOSYN) IVPB 3.375 g (3.375 g Intravenous New Bag/Given 01/27/15 0042)    inaptient tele per Dr. Blaine Hamper  I personally performed the services described in this documentation, which was scribed in my presence. The recorded information has been reviewed and is accurate.      Veatrice Kells, MD 01/27/15 [phone]

## 2015-01-26 NOTE — ED Notes (Signed)
Made aware of patient's hx and s/s at this time by NP, increased acuity and plan to move to acute side.

## 2015-01-26 NOTE — ED Notes (Signed)
Pt. reports worsening low back pain onset Saturday , denies injury , no dysuria or hematuria , pt. stated history of multiple  back surgeries , received Fentanyl 100 mcg by EMS with no relief.

## 2015-01-26 NOTE — ED Notes (Signed)
The patient said he has chronic back pain and he started having severe back pain.  He is supposed to have a procedure tomorrow and he could not stand the pain anymore.   EMS advised the patient was in a lot of pain with any movement.  EMS placed an IV and was given two doses of 33mcg of fentanyl for a total of 177mcg.

## 2015-01-26 NOTE — ED Provider Notes (Signed)
Pt presenting via EMS with acute on chronic back pain. Pain is severe, worsening over the past few days. He is supposed to have a procedure tomorrow but stated he could not stand the pain anymore. EMS gave a total of 100 g fentanyl with no relief. Patient was placed in fast track, however noted to be febrile and tachycardic. Appears in no acute distress at this time. Patient will be moved to the acute side of the emergency department for further care. Sepsis protocol initiated.  Carman Ching, PA-C 01/26/15 2121  Nat Christen, MD 01/27/15 [phone]

## 2015-01-27 ENCOUNTER — Inpatient Hospital Stay (HOSPITAL_COMMUNITY): Payer: Medicare Other

## 2015-01-27 ENCOUNTER — Encounter (HOSPITAL_COMMUNITY): Payer: Self-pay | Admitting: Emergency Medicine

## 2015-01-27 DIAGNOSIS — Z791 Long term (current) use of non-steroidal anti-inflammatories (NSAID): Secondary | ICD-10-CM | POA: Diagnosis not present

## 2015-01-27 DIAGNOSIS — Z4889 Encounter for other specified surgical aftercare: Secondary | ICD-10-CM | POA: Diagnosis not present

## 2015-01-27 DIAGNOSIS — M545 Low back pain: Secondary | ICD-10-CM | POA: Diagnosis not present

## 2015-01-27 DIAGNOSIS — Z8249 Family history of ischemic heart disease and other diseases of the circulatory system: Secondary | ICD-10-CM | POA: Diagnosis not present

## 2015-01-27 DIAGNOSIS — Z48817 Encounter for surgical aftercare following surgery on the skin and subcutaneous tissue: Secondary | ICD-10-CM | POA: Diagnosis not present

## 2015-01-27 DIAGNOSIS — E119 Type 2 diabetes mellitus without complications: Secondary | ICD-10-CM

## 2015-01-27 DIAGNOSIS — E538 Deficiency of other specified B group vitamins: Secondary | ICD-10-CM | POA: Diagnosis not present

## 2015-01-27 DIAGNOSIS — B9689 Other specified bacterial agents as the cause of diseases classified elsewhere: Secondary | ICD-10-CM | POA: Diagnosis not present

## 2015-01-27 DIAGNOSIS — N183 Chronic kidney disease, stage 3 (moderate): Secondary | ICD-10-CM | POA: Diagnosis not present

## 2015-01-27 DIAGNOSIS — M549 Dorsalgia, unspecified: Secondary | ICD-10-CM | POA: Diagnosis not present

## 2015-01-27 DIAGNOSIS — E1142 Type 2 diabetes mellitus with diabetic polyneuropathy: Secondary | ICD-10-CM | POA: Diagnosis not present

## 2015-01-27 DIAGNOSIS — I10 Essential (primary) hypertension: Secondary | ICD-10-CM | POA: Diagnosis not present

## 2015-01-27 DIAGNOSIS — M7989 Other specified soft tissue disorders: Secondary | ICD-10-CM | POA: Diagnosis not present

## 2015-01-27 DIAGNOSIS — S32028A Other fracture of second lumbar vertebra, initial encounter for closed fracture: Secondary | ICD-10-CM | POA: Diagnosis not present

## 2015-01-27 DIAGNOSIS — I129 Hypertensive chronic kidney disease with stage 1 through stage 4 chronic kidney disease, or unspecified chronic kidney disease: Secondary | ICD-10-CM | POA: Diagnosis not present

## 2015-01-27 DIAGNOSIS — M4646 Discitis, unspecified, lumbar region: Secondary | ICD-10-CM | POA: Diagnosis not present

## 2015-01-27 DIAGNOSIS — M109 Gout, unspecified: Secondary | ICD-10-CM | POA: Diagnosis not present

## 2015-01-27 DIAGNOSIS — A419 Sepsis, unspecified organism: Secondary | ICD-10-CM | POA: Diagnosis not present

## 2015-01-27 DIAGNOSIS — N400 Enlarged prostate without lower urinary tract symptoms: Secondary | ICD-10-CM

## 2015-01-27 DIAGNOSIS — E785 Hyperlipidemia, unspecified: Secondary | ICD-10-CM | POA: Diagnosis present

## 2015-01-27 DIAGNOSIS — E1122 Type 2 diabetes mellitus with diabetic chronic kidney disease: Secondary | ICD-10-CM | POA: Diagnosis not present

## 2015-01-27 DIAGNOSIS — M4626 Osteomyelitis of vertebra, lumbar region: Secondary | ICD-10-CM | POA: Diagnosis not present

## 2015-01-27 DIAGNOSIS — H409 Unspecified glaucoma: Secondary | ICD-10-CM | POA: Diagnosis not present

## 2015-01-27 DIAGNOSIS — M5489 Other dorsalgia: Secondary | ICD-10-CM | POA: Diagnosis present

## 2015-01-27 DIAGNOSIS — Z7984 Long term (current) use of oral hypoglycemic drugs: Secondary | ICD-10-CM | POA: Diagnosis not present

## 2015-01-27 DIAGNOSIS — Z981 Arthrodesis status: Secondary | ICD-10-CM | POA: Diagnosis not present

## 2015-01-27 DIAGNOSIS — A047 Enterocolitis due to Clostridium difficile: Secondary | ICD-10-CM | POA: Diagnosis not present

## 2015-01-27 DIAGNOSIS — M5186 Other intervertebral disc disorders, lumbar region: Secondary | ICD-10-CM | POA: Diagnosis not present

## 2015-01-27 DIAGNOSIS — C9110 Chronic lymphocytic leukemia of B-cell type not having achieved remission: Secondary | ICD-10-CM | POA: Diagnosis not present

## 2015-01-27 DIAGNOSIS — M4806 Spinal stenosis, lumbar region: Secondary | ICD-10-CM | POA: Diagnosis present

## 2015-01-27 DIAGNOSIS — R509 Fever, unspecified: Secondary | ICD-10-CM | POA: Diagnosis present

## 2015-01-27 DIAGNOSIS — Z87891 Personal history of nicotine dependence: Secondary | ICD-10-CM | POA: Diagnosis not present

## 2015-01-27 DIAGNOSIS — C9510 Chronic leukemia of unspecified cell type not having achieved remission: Secondary | ICD-10-CM | POA: Diagnosis not present

## 2015-01-27 LAB — CBC
HCT: 32.3 % — ABNORMAL LOW (ref 39.0–52.0)
Hemoglobin: 10.6 g/dL — ABNORMAL LOW (ref 13.0–17.0)
MCH: 28.1 pg (ref 26.0–34.0)
MCHC: 32.8 g/dL (ref 30.0–36.0)
MCV: 85.7 fL (ref 78.0–100.0)
Platelets: 248 10*3/uL (ref 150–400)
RBC: 3.77 MIL/uL — ABNORMAL LOW (ref 4.22–5.81)
RDW: 15.4 % (ref 11.5–15.5)
WBC: 12.6 10*3/uL — ABNORMAL HIGH (ref 4.0–10.5)

## 2015-01-27 LAB — GLUCOSE, CAPILLARY
Glucose-Capillary: 119 mg/dL — ABNORMAL HIGH (ref 65–99)
Glucose-Capillary: 105 mg/dL — ABNORMAL HIGH (ref 65–99)
Glucose-Capillary: 101 mg/dL — ABNORMAL HIGH (ref 65–99)
Glucose-Capillary: 154 mg/dL — ABNORMAL HIGH (ref 65–99)

## 2015-01-27 LAB — PROCALCITONIN: Procalcitonin: 0.19 ng/mL

## 2015-01-27 LAB — SEDIMENTATION RATE: Sed Rate: 61 mm/hr — ABNORMAL HIGH (ref 0–16)

## 2015-01-27 LAB — BASIC METABOLIC PANEL
Anion gap: 8 (ref 5–15)
BUN: 14 mg/dL (ref 6–20)
CO2: 25 mmol/L (ref 22–32)
Calcium: 8.8 mg/dL — ABNORMAL LOW (ref 8.9–10.3)
Chloride: 105 mmol/L (ref 101–111)
Creatinine, Ser: 1.32 mg/dL — ABNORMAL HIGH (ref 0.61–1.24)
GFR calc Af Amer: 57 mL/min — ABNORMAL LOW (ref >60)
GFR calc non Af Amer: 49 mL/min — ABNORMAL LOW (ref >60)
Glucose, Bld: 122 mg/dL — ABNORMAL HIGH (ref 65–99)
Potassium: 4 mmol/L (ref 3.5–5.1)
Sodium: 138 mmol/L (ref 135–145)

## 2015-01-27 LAB — APTT: aPTT: 33 seconds (ref 24–37)

## 2015-01-27 LAB — PROTIME-INR
INR: 1.19 (ref 0.00–1.49)
Prothrombin Time: 15.3 seconds — ABNORMAL HIGH (ref 11.6–15.2)

## 2015-01-27 LAB — LACTIC ACID, PLASMA
Lactic Acid, Venous: 1.2 mmol/L (ref 0.5–2.0)
Lactic Acid, Venous: 0.8 mmol/L (ref 0.5–2.0)

## 2015-01-27 LAB — C-REACTIVE PROTEIN: CRP: 19.5 mg/dL — ABNORMAL HIGH (ref <1.0)

## 2015-01-27 LAB — TYPE AND SCREEN
ABO/RH(D): O POS
Antibody Screen: NEGATIVE

## 2015-01-27 LAB — I-STAT CG4 LACTIC ACID, ED: Lactic Acid, Venous: 0.82 mmol/L (ref 0.5–2.0)

## 2015-01-27 MED ORDER — LATANOPROST 0.005 % OP SOLN
1.0000 [drp] | Freq: Every day | OPHTHALMIC | Status: DC
Start: 2015-01-27 — End: 2015-02-04
  Administered 2015-01-27 – 2015-02-03 (×8): 1 [drp] via OPHTHALMIC
  Filled 2015-01-27 (×2): qty 2.5

## 2015-01-27 MED ORDER — OXYCODONE-ACETAMINOPHEN 5-325 MG PO TABS
1.0000 | ORAL_TABLET | ORAL | Status: DC | PRN
  Administered 2015-01-27 – 2015-02-04 (×28): 2 via ORAL
  Filled 2015-01-27 (×3): qty 2
  Filled 2015-01-27: qty 1
  Filled 2015-01-27 (×8): qty 2
  Filled 2015-01-27: qty 1
  Filled 2015-01-27 (×17): qty 2

## 2015-01-27 MED ORDER — PIPERACILLIN-TAZOBACTAM 3.375 G IVPB 30 MIN: 3.3750 g | Freq: Three times a day (TID) | INTRAVENOUS | Status: DC

## 2015-01-27 MED ORDER — ONDANSETRON HCL 4 MG/2ML IJ SOLN: 4.0000 mg | Freq: Four times a day (QID) | INTRAMUSCULAR | Status: DC | PRN

## 2015-01-27 MED ORDER — INSULIN ASPART 100 UNIT/ML ~~LOC~~ SOLN
0.0000 [IU] | Freq: Three times a day (TID) | SUBCUTANEOUS | Status: DC
  Administered 2015-01-28 – 2015-02-04 (×9): 1 [IU] via SUBCUTANEOUS

## 2015-01-27 MED ORDER — MORPHINE SULFATE (PF) 2 MG/ML IV SOLN
2.0000 mg | INTRAVENOUS | Status: DC | PRN
  Administered 2015-01-27 (×3): 2 mg via INTRAVENOUS
  Filled 2015-01-27 (×4): qty 1

## 2015-01-27 MED ORDER — ONDANSETRON HCL 4 MG PO TABS: 4.0000 mg | ORAL_TABLET | Freq: Four times a day (QID) | ORAL | Status: DC | PRN

## 2015-01-27 MED ORDER — VITAMIN B-12 1000 MCG PO TABS
1000.0000 ug | ORAL_TABLET | Freq: Every day | ORAL | Status: DC
  Administered 2015-01-27 – 2015-02-04 (×9): 1000 ug via ORAL
  Filled 2015-01-27 (×9): qty 1

## 2015-01-27 MED ORDER — ALLOPURINOL 300 MG PO TABS
300.0000 mg | ORAL_TABLET | Freq: Every day | ORAL | Status: DC
  Administered 2015-01-27 – 2015-02-04 (×9): 300 mg via ORAL
  Filled 2015-01-27 (×9): qty 1

## 2015-01-27 MED ORDER — PIPERACILLIN-TAZOBACTAM 3.375 G IVPB
3.3750 g | Freq: Three times a day (TID) | INTRAVENOUS | Status: DC
  Administered 2015-01-27 – 2015-01-29 (×7): 3.375 g via INTRAVENOUS
  Filled 2015-01-27 (×9): qty 50

## 2015-01-27 MED ORDER — GADOBENATE DIMEGLUMINE 529 MG/ML IV SOLN
20.0000 mL | Freq: Once | INTRAVENOUS | Status: AC | PRN
  Administered 2015-01-27: 20 mL via INTRAVENOUS

## 2015-01-27 MED ORDER — ACETAMINOPHEN 325 MG PO TABS: 650.0000 mg | ORAL_TABLET | Freq: Four times a day (QID) | ORAL | Status: DC | PRN

## 2015-01-27 MED ORDER — SODIUM CHLORIDE 0.9 % IJ SOLN
3.0000 mL | Freq: Two times a day (BID) | INTRAMUSCULAR | Status: DC
  Administered 2015-01-27 – 2015-02-04 (×9): 3 mL via INTRAVENOUS

## 2015-01-27 MED ORDER — SODIUM CHLORIDE 0.9 % IV SOLN
1250.0000 mg | INTRAVENOUS | Status: DC
  Administered 2015-01-27 – 2015-01-30 (×4): 1250 mg via INTRAVENOUS
  Filled 2015-01-27 (×5): qty 1250

## 2015-01-27 MED ORDER — HYDRALAZINE HCL 20 MG/ML IJ SOLN: 5.0000 mg | INTRAMUSCULAR | Status: DC | PRN

## 2015-01-27 MED ORDER — PIPERACILLIN-TAZOBACTAM 3.375 G IVPB 30 MIN
3.3750 g | Freq: Once | INTRAVENOUS | Status: AC
  Administered 2015-01-27: 3.375 g via INTRAVENOUS
  Filled 2015-01-27: qty 50

## 2015-01-27 MED ORDER — SODIUM CHLORIDE 0.9 % IV SOLN
INTRAVENOUS | Status: DC
  Administered 2015-01-27 – 2015-01-31 (×5): via INTRAVENOUS

## 2015-01-27 MED ORDER — TAMSULOSIN HCL 0.4 MG PO CAPS
0.4000 mg | ORAL_CAPSULE | Freq: Every day | ORAL | Status: DC
  Administered 2015-01-27 – 2015-02-04 (×9): 0.4 mg via ORAL
  Filled 2015-01-27 (×9): qty 1

## 2015-01-27 MED ORDER — HEPARIN SODIUM (PORCINE) 5000 UNIT/ML IJ SOLN
5000.0000 [IU] | Freq: Three times a day (TID) | INTRAMUSCULAR | Status: DC
  Administered 2015-01-27 – 2015-01-29 (×7): 5000 [IU] via SUBCUTANEOUS
  Filled 2015-01-27 (×7): qty 1

## 2015-01-27 MED ORDER — VANCOMYCIN HCL IN DEXTROSE 1-5 GM/200ML-% IV SOLN
1000.0000 mg | Freq: Once | INTRAVENOUS | Status: AC
  Administered 2015-01-27: 1000 mg via INTRAVENOUS
  Filled 2015-01-27: qty 200

## 2015-01-27 NOTE — Progress Notes (Addendum)
Patient admitted earlier today. H&P reviewed. Patient seen and examined.  S: Patient continues to have lower back pain especially with movement. He had fever at home. Denies any nausea, vomiting. Denies dysuria or diarrhea.  O: Afebrile this morning. Other vital signs stable.  Lungs are clear to auscultation bilaterally. S1, S2 is normal, regular. No S3, S4, no rubs, murmurs, or bruit. No pedal edema Abdomen soft, nontender, nondistended. Bowel sounds are present. No masses or organomegaly Examination of the back reveals scar from previous surgery in the lower back. Tender to palpation. Warm to touch. No obvious discharge or drainage. Able to lift both legs off the bed. No focal neurological deficits noted  A/P: This is a 81 year old African-American male who underwent lumbar fusion surgery in March. Comes in with fever and lower back pain. CT scan was done which shows evidence for discitis. There is loosening of the L1 and L3 pedicle screws. All these findings are concerning for infection of hardware.  I have called Dr. Sande Rives office. He treated on this patient in March. Dr. Hal Neer is in the OR. His office staff took patient's information and will transmit to him. In the meantime, continue with antibiotics.  Management of other issues as outlined in H&P. Lower extremity venous Doppler is pending for asymmetric swelling  We will continue to monitor.  ADDENDUM Discussed with Dr. Hal Neer. He recommends MRI of the lumbar spine. Recommends continuing antibiotics. No indication for surgical intervention currently. He will evaluate patient tomorrow.  Hopie Pellegrin 11:59 AM 01/27/2015

## 2015-01-27 NOTE — Progress Notes (Signed)
PATIENT ARRIVED TO UNIT 2W FROM E.D. VIA STRETCHER. SLID OVER INTO BED DUE TO BACK PAIN AND EFFECTED GAIT. TELE APPLIED. VITALS OBTAINED. ASSESSMENT PERFORMED.   SPOUSE AT BEDSIDE. PATIENT ORIENTED TO UNIT AND USE OF EQUIPMENT. INSTRUCTED TO CALL FOR ASSISTANCE WHEN NEEDED.

## 2015-01-27 NOTE — H&P (Signed)
[facility] History and Physical  Kevin Rogers VJK:[id number] DOB: [birth year] DOA: 01/26/2015  Referring physician: ED physician PCP: Garret Reddish, MD  Specialists:   Chief Complaint: fever and worsening back pain  HPI: Kevin Rogers is a 81 y.o. male with PMH of lumbar spinal stenosis, s/p of lumbar laminectomy, lumbar fusion procedure, lumbar wound debridement on 07/23/14 by Dr. Dillard Essex, hypertension, diabetes mellitus, gout, chronic leukemia, BPH, who presents with a fever and a worsening back pain.  Patient reports that he has been having fever and worsening back pain in the past 2 days. He did not have injury to his back. He does not have weakness, numbness or tingling sensations in his lower extremities, no urinary incontinence, or loss controlled of bowel movement. He states that he always feels cold in right leg, and left leg is more swollen than the right, which is chronic issue, has not changed recently. Patient does not have cough, shortness breath, chest pain, any nose, sore throat, symptoms of a UTI, abdominal pain, nausea, vomiting, diarrhea, rashes.  In ED, patient was found to have WBC14.6 which was 15.1 on 08/14/14, temperature 102.5, tachycardia, stable renal function, negative urinalysis, elevated lactate 2.21 which improved to 0.82 after 2L normal saline bolus in emergency room. Chest x-ray is negative for acute abnormalities. X-ray of lumbar spin showed L1 and L2 superior endplate fractures which have developed since 09/09/2014; and L1-2 thru L3-4 PLIF with posterior fixation hardware from L1-L3; no hardware displacement.  Where does patient live?   At home  Can patient participate in ADLs?  Some   Review of Systems:   General: has fevers, chills, no changes in body weight, has fatigue HEENT: no blurry vision, hearing changes or sore throat Pulm: no dyspnea, coughing, wheezing CV: no chest pain, palpitations Abd: no nausea, vomiting, abdominal pain, diarrhea,  constipation GU: no dysuria, burning on urination, increased urinary frequency, hematuria  Ext: has leg edema Neuro: no unilateral weakness, numbness, or tingling, no vision change or hearing loss Skin: no rash MSK: has lower back pain with limitation of range of movement in spin due to pain. Heme: No easy bruising.  Travel history: No recent long distant travel.  Allergy: No Known Allergies  Past Medical History  Diagnosis Date  . Diabetes mellitus   . Hyperlipidemia   . Hypertension   . Leukemia, chronic 2001    does not take treatment just has blood levels checked once a year  . Glaucoma     left eye  . Gout   . Acute bronchitis 09/12/2007    Qualifier: Diagnosis of  By: [name] MD, [name]     Past Surgical History  Procedure Laterality Date  . Lumbar laminectomy  1997  . Hernia repair  1989  . Tonsillectomy    . Colonoscopy w/ polypectomy    . Dental surgery      implanted teeth x 2  . Anterior lat lumbar fusion Left 01/03/2013    Procedure: ANTERIOR LATERAL LUMBAR FUSION lumbar three-four;  Surgeon: [name], MD;  Location: [place] [facility];  Service: Neurosurgery;  Laterality: Left;  . Lumbar percutaneous pedicle screw 1 level  01/03/2013    Procedure: LUMBAR PERCUTANEOUS PEDICLE SCREWS LUMBAR THREE-FOUR;  Surgeon: [name], MD;  Location: [facility];  Service: Neurosurgery;;  . Back surgery      lumbar fusion - 2016, feb  . Lumbar wound debridement N/A 07/23/2014    Procedure: LUMBAR WOUND DEBRIDEMENT;  Surgeon: [name], MD;  Location: [place] [facility];  Service: Neurosurgery;  Laterality: N/A;    Social History:  reports that he quit smoking about 19 years ago. His smoking use included Cigarettes. He has a 5 pack-year smoking history. He has never used smokeless tobacco. He reports that he drinks about 0.6 oz of alcohol per week. He reports that he does not use illicit drugs.  Family History:  Family History  Problem Relation Age of Onset  . Heart  disease Mother     104, second hand  . Heart disease Father     72, former smoker     Prior to Admission medications   Medication Sig Start Date End Date Taking? Authorizing Provider  acetaminophen (TYLENOL) 500 MG tablet Take 500-1,000 mg by mouth every 6 (six) hours as needed (pain).   Yes Historical Provider, MD  allopurinol (ZYLOPRIM) 300 MG tablet Take 1 tablet by mouth  daily 12/30/14  Yes [name], MD  glipiZIDE (GLUCOTROL XL) 5 MG 24 hr tablet Take 1 tablet (5 mg total) by mouth daily. 07/31/14  Yes [name], MD  losartan (COZAAR) 100 MG tablet Take 1 tablet by mouth  daily 09/02/14  Yes [name], MD  meloxicam [facility]) 15 MG tablet Take 1 tablet by mouth  daily 09/02/14  Yes [name], MD  nabumetone (RELAFEN) 500 MG tablet Take 500 mg by mouth 2 (two) times daily as needed. Stopped for procedure 01/13/15  Yes Historical Provider, MD  tamsulosin (FLOMAX) 0.4 MG CAPS capsule Take 1 capsule by mouth  daily 09/02/14  Yes [name], MD  TRAVATAN Z 0.004 % SOLN ophthalmic solution Place 1 drop into both eyes at bedtime.  09/03/14  Yes Historical Provider, MD  vitamin B-12 (CYANOCOBALAMIN) 1000 MCG tablet Take 1,000 mcg by mouth daily.   Yes Historical Provider, MD  glucose blood (ONETOUCH VERIO) test strip Use to test blood sugars daily. Dx: E11.9 08/18/14   [name], MD  ONE TOUCH LANCETS MISC Use to test blood sugars. Dx:E11.9 08/18/14   [name], MD  oxyCODONE-acetaminophen (PERCOCET/ROXICET) 5-325 MG per tablet Take 1-2 tablets by mouth every 4 (four) hours as needed for moderate pain. Patient not taking: Reported on 01/13/2015 06/28/14   Consuella Lose, MD    Physical Exam: Filed Vitals:   01/27/15 0100 01/27/15 0115 01/27/15 0130 01/27/15 0145  BP: 124/70 131/73 126/73 140/92  Pulse: 77 76 74 118  Temp:      TempSrc:      Resp:      Height:      Weight:      SpO2: 96% 95% 96% 95%   General: Not in acute distress HEENT:       Eyes:  PERRL, EOMI, no scleral icterus.       ENT: No discharge from the ears and nose, no pharynx injection, no tonsillar enlargement.        Neck: No JVD, no bruit, no mass felt. Heme: No neck lymph node enlargement. Cardiac: S1/S2, RRR, No murmurs, No gallops or rubs. Pulm:  No rales, wheezing, rhonchi or rubs. Abd: Soft, nondistended, nontender, no rebound pain, no organomegaly, BS present. Ext: trace leg leg edema, no R leg edema. 2+DP/PT pulse bilaterally. Musculoskeletal: No joint deformities, No joint redness or warmth. Has tenderness over the midline of lower back. Previous surgical wound healed well.  Skin: No rashes.  Neuro: Alert, oriented X3, cranial nerves II-XII grossly intact, muscle strength 5/5 in all extremities,  sensation to light touch intact. Brachial reflex 1+ bilaterally. Knee reflex 1+ bilaterally. Negative Babinski's sign. Normal finger to nose test. Psych: Patient is not psychotic, no suicidal or hemocidal ideation.  Labs on Admission:  Basic Metabolic Panel:  Recent Labs Lab 01/26/15 2128  NA 137  K 4.4  CL 101  CO2 25  GLUCOSE 184*  BUN 13  CREATININE 1.41*  CALCIUM 9.8   Liver Function Tests:  Recent Labs Lab 01/26/15 2128  AST 24  ALT 15*  ALKPHOS 89  BILITOT 0.8  PROT 7.5  ALBUMIN 3.6   No results for input(s): LIPASE, AMYLASE in the last 168 hours. No results for input(s): AMMONIA in the last 168 hours. CBC:  Recent Labs Lab 01/26/15 2128  WBC 14.6*  NEUTROABS 6.3  HGB 12.8*  HCT 38.3*  MCV 84.7  PLT 298   Cardiac Enzymes: No results for input(s): CKTOTAL, CKMB, CKMBINDEX, TROPONINI in the last 168 hours.  BNP (last 3 results) No results for input(s): BNP in the last 8760 hours.  ProBNP (last 3 results) No results for input(s): PROBNP in the last 8760 hours.  CBG: No results for input(s): GLUCAP in the last 168 hours.  Radiological Exams on Admission: Dg Chest 2 View  01/26/2015   CLINICAL DATA:  Back pain tonight with  fever.  Recent back surgery  EXAM: CHEST  2 VIEW  COMPARISON:  January 01, 2013  FINDINGS: The heart size and mediastinal contours are within normal limits. The aorta is tortuous. There is no focal infiltrate, pulmonary edema, or pleural effusion. The visualized skeletal structures are unremarkable.  IMPRESSION: No active cardiopulmonary disease.   Electronically Signed   By: Abelardo Diesel M.D.   On: 01/26/2015 22:35   Dg Lumbar Spine Complete  01/27/2015   CLINICAL DATA:  Lower back pain.  Recent back surgery.  EXAM: LUMBAR SPINE - COMPLETE 4+ VIEW  COMPARISON:  09/09/2014  FINDINGS: Recent L1-2 and L2-3 posterior lumbar interbody fusion with rod and pedicle screw fixation. Since comparison radiograph, there has been superior endplate height loss with narrowing between the endplates and pedicle screws. No hardware fracture or displacement. Intervertebral cages are located, including pre-existing cage at the L3-4 level.  Disc and facet degeneration is severe and diffuse, including at T12-L1 where there is chronic endplate erosion.  IMPRESSION: 1. L1 and L2 superior endplate fractures which have developed since 09/09/2014. 2. L1-2 thru L3-4 PLIF with posterior fixation hardware from L1-L3. No hardware displacement.   Electronically Signed   By: Monte Fantasia M.D.   On: 01/27/2015 00:07    EKG: Not done in ED, will get one.   Assessment/Plan Principal Problem:   Sepsis Active Problems:   Chronic lymphocytic leukemia   Diabetes mellitus type II, controlled   B12 deficiency   Gout   Diabetic polyneuropathy   Glaucoma   Essential hypertension   BPH (benign prostatic hyperplasia)   Lumbar spinal stenosis   CKD (chronic kidney disease), stage III   Fever   Back pain  Fever and epsis: Patient is septic on admission with fever, tachycardia, leukocytosis and tachypnea. Hemodynamically stable. The source of infection is not clear. Urinalysis negative, chest x-ray is negative. Patient does not have  symptoms of UTI or respiratory infection. No GI symptoms. Potential possibilities is infection over lumbar hardware given his worsening back pain. X-ray of lumbar spin showed L1 and L2 superior endplate fractures which have developed since 09/09/2014. Pt can not do MRI due to presence of lumbar  hardware.  -will admit to tele bed -IV vancomycine and zosyn -will get Procalcitonin and trend lactic acid levels per sepsis protocol. -IVF: 2L of NS bolus in ED, followed by 75 cc/h  -CT-lumbar spin w/o CM -check ESR and CRP -Follow up Bx and Ux - type and cross, INR/PTT - Please call ortho or Neurosurgeon  in AM  Back pain: X-ray of lumbar spin showed L1 and L2 superior endplate fractures which have developed since 09/09/2014. Also possible for infection in lower back. - see above - Pain control: morphine prn and percocet  Chronic lymphocytic leukemia: Baseline WBCs between 12-17, his WBC 14.6, with 55% lymphocytes. Stable. Patient has been followed up by oncologist, last seen was 5 month ago. He could not remember doctor's name. He has appointment in next month -f/u with his oncologist.  DM-II: Last A1c 5.6 on 01/13/15, well controled. Patient is taking metformin at home -SSI  Gout: stable. -Continue allopurinol  Essential hypertension: -hold Cozaar since patient is at risk of developing hypotension -IV hydralazine when necessary  BPH: stable - Continue Flomax  CKD (chronic kidney disease), stage III: Stable. Baseline creatinine 1.27-1.45. His creatinine is 1.41, which is at baseline. -Follow-up renal function by BMP  Asymmetric leg edema: pt reports asymmetrically edema, L>R. On my exam, there is trace leg leg edema, no R leg edema. He has good pulses bilaterally.  -check LE doppler for left leg to r/o DVT.    DVT ppx: SQ Heparin   Code Status: Full code Family Communication:  Yes, patient's fianc at bed side Disposition Plan: Admit to inpatient   Date of Service 01/27/2015     Ivor Costa [facility] Pager [phone]  If 7PM-7AM, please contact night-coverage [email or web address] Password [email or web address] 01/27/2015, 2:16 AM

## 2015-01-27 NOTE — Plan of Care (Signed)
Problem: Phase I Progression Outcomes Goal: Pain controlled with appropriate interventions Outcome: Progressing Back pain has been controlled with prn morphine. Pt received 2 doses this shift. He was able to get out of bed and ambulate to bathroom this evening with use of walker and RN as stand by assist.

## 2015-01-27 NOTE — Progress Notes (Signed)
PT Cancellation Note  Patient Details Name: Kevin Rogers MRN: [id number] DOB: [birth year]   Cancelled Treatment:    Reason Eval/Treat Not Completed: Patient not medically ready. Discussed pt case with RN who states that pt is pending a LE doppler to R/O DVT. Will hold PT eval at this time. Will check back and complete evaluation as able per results of LE doppler.    Rolinda Roan 01/27/2015, 8:00 AM   Rolinda Roan, PT, DPT Acute Rehabilitation Services Pager: [phone]

## 2015-01-27 NOTE — Care Management Note (Signed)
Case Management Note Marvetta Gibbons RN, BSN Unit 2W-Case Manager [phone]  Patient Details  Name: Kevin Rogers MRN: [id number] Date of Birth: 1932-04-08  Subjective/Objective:    Pt admitted with sepsis, unknown source                Action/Plan: PTA pt lived at home - PT/OT evals pending- NCM to follow for recommendations  Expected Discharge Date:                Expected Discharge Plan:  [place]  In-House Referral:     Discharge planning Services  CM Consult  Post Acute Care Choice:    Choice offered to:     DME Arranged:    DME Agency:     HH Arranged:    HH Agency:     Status of Service:  In process, will continue to follow  Medicare Important Message Given:    Date Medicare IM Given:    Medicare IM give by:    Date Additional Medicare IM Given:    Additional Medicare Important Message give by:     If discussed at [place] of Stay Meetings, dates discussed:    Additional Comments:  Dawayne Patricia, RN 01/27/2015, 11:59 AM

## 2015-01-27 NOTE — Progress Notes (Signed)
Utilization review completed.  

## 2015-01-27 NOTE — Progress Notes (Signed)
ANTIBIOTIC CONSULT NOTE - INITIAL  Pharmacy Consult for vancomycin Indication: rule out sepsis  No Known Allergies  Patient Measurements: Height: 6' (182.9 cm) Weight: 232 lb 9 oz (105.49 kg) IBW/kg (Calculated) : 77.6  Vital Signs: Temp: 102.5 F (39.2 C) (09/26 2017) Temp Source: Oral (09/26 2017) BP: 126/73 mmHg (09/27 0130) Pulse Rate: 74 (09/27 0130)  Labs:  Recent Labs  01/26/15 2128  WBC 14.6*  HGB 12.8*  PLT 298  CREATININE 1.41*   Estimated Creatinine Clearance: 52.5 mL/min (by C-G formula based on Cr of 1.41).   Medical History: Past Medical History  Diagnosis Date  . Diabetes mellitus   . Hyperlipidemia   . Hypertension   . Leukemia, chronic 2001    does not take treatment just has blood levels checked once a year  . Glaucoma     left eye  . Gout   . Acute bronchitis 09/12/2007    Qualifier: Diagnosis of  By: [name] MD, [name]      Assessment: 81yo male c/o worsening back pain, sepsis code called after VS revealed fever and tachycardia, unclear etiology, to begin IV ABX.  Goal of Therapy:  Vancomycin trough level 15-20 mcg/ml  Plan:  Rec'd vanc 1g in ED; will continue with vancomycin 1250mg  IV Q24H and monitor CBC, Cx, levels prn.  Wynona Neat, PharmD, BCPS  01/27/2015,2:10 AM

## 2015-01-28 ENCOUNTER — Encounter (HOSPITAL_COMMUNITY): Payer: Medicare Other

## 2015-01-28 ENCOUNTER — Encounter (HOSPITAL_COMMUNITY): Payer: Self-pay | Admitting: Infectious Diseases

## 2015-01-28 DIAGNOSIS — E1122 Type 2 diabetes mellitus with diabetic chronic kidney disease: Secondary | ICD-10-CM

## 2015-01-28 DIAGNOSIS — E785 Hyperlipidemia, unspecified: Secondary | ICD-10-CM | POA: Diagnosis present

## 2015-01-28 DIAGNOSIS — B9689 Other specified bacterial agents as the cause of diseases classified elsewhere: Secondary | ICD-10-CM

## 2015-01-28 DIAGNOSIS — M4626 Osteomyelitis of vertebra, lumbar region: Secondary | ICD-10-CM

## 2015-01-28 DIAGNOSIS — M4646 Discitis, unspecified, lumbar region: Secondary | ICD-10-CM

## 2015-01-28 DIAGNOSIS — Z981 Arthrodesis status: Secondary | ICD-10-CM

## 2015-01-28 DIAGNOSIS — M7989 Other specified soft tissue disorders: Secondary | ICD-10-CM

## 2015-01-28 DIAGNOSIS — N182 Chronic kidney disease, stage 2 (mild): Secondary | ICD-10-CM

## 2015-01-28 LAB — BASIC METABOLIC PANEL
Anion gap: 8 (ref 5–15)
BUN: 12 mg/dL (ref 6–20)
CO2: 24 mmol/L (ref 22–32)
Calcium: 9.1 mg/dL (ref 8.9–10.3)
Chloride: 107 mmol/L (ref 101–111)
Creatinine, Ser: 1.36 mg/dL — ABNORMAL HIGH (ref 0.61–1.24)
GFR calc Af Amer: 55 mL/min — ABNORMAL LOW (ref >60)
GFR calc non Af Amer: 48 mL/min — ABNORMAL LOW (ref >60)
Glucose, Bld: 126 mg/dL — ABNORMAL HIGH (ref 65–99)
Potassium: 4.2 mmol/L (ref 3.5–5.1)
Sodium: 139 mmol/L (ref 135–145)

## 2015-01-28 LAB — GLUCOSE, CAPILLARY
Glucose-Capillary: 117 mg/dL — ABNORMAL HIGH (ref 65–99)
Glucose-Capillary: 116 mg/dL — ABNORMAL HIGH (ref 65–99)
Glucose-Capillary: 128 mg/dL — ABNORMAL HIGH (ref 65–99)
Glucose-Capillary: 107 mg/dL — ABNORMAL HIGH (ref 65–99)

## 2015-01-28 LAB — CBC
HCT: 32.5 % — ABNORMAL LOW (ref 39.0–52.0)
Hemoglobin: 10.6 g/dL — ABNORMAL LOW (ref 13.0–17.0)
MCH: 27.9 pg (ref 26.0–34.0)
MCHC: 32.6 g/dL (ref 30.0–36.0)
MCV: 85.5 fL (ref 78.0–100.0)
Platelets: 263 10*3/uL (ref 150–400)
RBC: 3.8 MIL/uL — ABNORMAL LOW (ref 4.22–5.81)
RDW: 15.3 % (ref 11.5–15.5)
WBC: 12.3 10*3/uL — ABNORMAL HIGH (ref 4.0–10.5)

## 2015-01-28 LAB — URINE CULTURE

## 2015-01-28 MED ORDER — RIFAMPIN 300 MG PO CAPS
300.0000 mg | ORAL_CAPSULE | Freq: Every day | ORAL | Status: DC
  Administered 2015-01-28 – 2015-02-04 (×8): 300 mg via ORAL
  Filled 2015-01-28 (×8): qty 1

## 2015-01-28 NOTE — Evaluation (Signed)
Occupational Therapy Evaluation Patient Details Name: Kevin Rogers MRN: [id number] DOB: 04/14/1932 Today's Date: 01/28/2015    History of Present Illness Pt is an 81 y/o male with a PMH of lumbar spinal stenosis s/p lumbar laminectomy, lumbar fusion, lumbar wound debridement on 07/23/14, HTN, DM, gout, leukemia, BPH. Pt presents with worsening fever and back pain. CT scan was done which shows evidence for discitis. There is loosening of the L1 and L3 pedicle screws. All these findings are concerning for infection of hardware.   Clinical Impression   Pt currently demonstrates moderate limitations with selfcare at this time secondary to back pain.  Pt needs min assist for sit to stand from bedside chair as well as mod to max assist for LB selfcare tasks.  Recommend continued OT while in acute stay with HHOT recommended if pt discharges home soon as well as 24 hour supervision.  Pt reports that he can stay with a friend when he leaves the hospital.  At this point feel pt's follow-up may also depend on recommendations from MD regarding treatment of loose hardware.  Will continue to update as we follow.       Follow Up Recommendations  Home health OT;Supervision/Assistance - 24 hour    Equipment Recommendations  None recommended by OT (Pt has 3:1 and will get friend to purchase tub/shower seat)       Precautions / Restrictions Precautions Precautions: Fall;Back Precaution Booklet Issued: No Restrictions Weight Bearing Restrictions: No      Mobility Bed Mobility                  Transfers Overall transfer level: Needs assistance Equipment used: Rolling walker (2 wheeled)   Sit to Stand: Min assist         General transfer comment: Increased time and min assist needed for sit to stand from bedside recliner.    Balance Overall balance assessment: Needs assistance   Sitting balance-Leahy Scale: Fair       Standing balance-Leahy Scale: Poor Standing balance comment: Pt  needing UE support on the sink when standing to complete grooming tasks.                             ADL Overall ADL's : Needs assistance/impaired Eating/Feeding: Independent;Sitting   Grooming: Wash/dry hands;Wash/dry face;Oral care;Standing;Min guard   Upper Body Bathing: Set up;Sitting   Lower Body Bathing: Sit to/from stand;Moderate assistance   Upper Body Dressing : Supervision/safety;Sitting   Lower Body Dressing: Maximal assistance;Sit to/from stand   Toilet Transfer: BSC;RW;Ambulation;Minimal assistance   Toileting- Clothing Manipulation and Hygiene: Minimal assistance;Sit to/from stand       Functional mobility during ADLs: Minimal assistance;Rolling walker General ADL Comments: Pt with decreased ability for sit to stand from recliner, required min assist to complete.  Decreased ability to reach either LE when attempting to bring up and cross over.  Pt reports having a reacher at home but no other AE, and is not familiar with its use during dressing tasks.  Educated pt to make plans to stay with his friend at discharge, depending on MD recommendations for follow-up procedure.       Vision Vision Assessment?: No apparent visual deficits   Perception Perception Perception Tested?: No   Praxis Praxis Praxis tested?: Within functional limits    Pertinent Vitals/Pain Pain Assessment: 0-10 Pain Score: 9  Pain Location: Low back in standing Pain Intervention(s): Limited activity within patient's tolerance;Repositioned  Hand Dominance Right   Extremity/Trunk Assessment Upper Extremity Assessment Upper Extremity Assessment: Generalized weakness (Pt with AROM WFLs for all joints, strength grossly 3+/5 throughout, did not formally assess secondary to increased back pain.)   Lower Extremity Assessment Lower Extremity Assessment: Defer to PT evaluation   Cervical / Trunk Assessment Cervical / Trunk Assessment: Kyphotic   Communication  Communication Communication: No difficulties   Cognition Arousal/Alertness: Awake/alert Behavior During Therapy: WFL for tasks assessed/performed Overall Cognitive Status: Within Functional Limits for tasks assessed                                Home Living Family/patient expects to be discharged to:: Private residence Living Arrangements: Alone Available Help at Discharge: Friend(s);Available 24 hours/day Type of Home: House Home Access: Stairs to enter [employer] of Steps: 4 Entrance Stairs-Rails: Right Home Layout: One level     Bathroom Shower/Tub: Occupational psychologist: Standard     Home Equipment: Environmental consultant - 2 wheels;Cane - single point   Additional Comments: Pt states he cannot use the RW in the house due to space, and typically uses 2 SPC's instead.       Prior Functioning/Environment Level of Independence: Independent with assistive device(s)             OT Diagnosis: Generalized weakness;Acute pain   OT Problem List: Decreased strength;Decreased activity tolerance;Impaired balance (sitting and/or standing);Pain;Decreased knowledge of use of DME or AE   OT Treatment/Interventions: Self-care/ADL training;Patient/family education;Balance training;Therapeutic activities;DME and/or AE instruction    OT Goals(Current goals can be found in the care plan section) Acute Rehab OT Goals Patient Stated Goal: Get his pain better. OT Goal Formulation: With patient Time For Goal Achievement: 02/11/15 Potential to Achieve Goals: Good  OT Frequency: Min 2X/week   Barriers to D/C: Decreased caregiver support             End of Session Equipment Utilized During Treatment: Rolling walker Nurse Communication: Mobility status  Activity Tolerance: Patient limited by pain Patient left: in chair;with call bell/phone within reach   Time: 1145-1218 OT Time Calculation (min): 33 min Charges:  OT General Charges $OT Visit: 1  Procedure OT Evaluation $Initial OT Evaluation Tier I: 1 Procedure OT Treatments $Self Care/Home Management : 8-22 mins  Jiana Lemaire OTR/L 01/28/2015, 12:56 PM

## 2015-01-28 NOTE — Progress Notes (Signed)
CRITICAL VALUE ALERT  Critical value received:  POSITIVE BLOOD CULTURE(S); AEROBIC BOTTLE SHOWING GRAM POSITIVE COCCI IN CLUSTERS  Date of notification:  01/28/15  Time of notification:  0015  Critical value read back:Yes.    Nurse who received alert:  Billee Cashing, RN  MD notified (1st page):  ON-CALL  Time of first page:  0015  MD notified (2nd page):  Time of second page:  Responding MD:  ON-CALL  Time MD responded:  [phone]

## 2015-01-28 NOTE — Evaluation (Signed)
Physical Therapy Evaluation Patient Details Name: Kevin Rogers MRN: [id number] DOB: 27-Jul-1931 Today's Date: 01/28/2015   History of Present Illness  Pt is an 81 y/o male with a PMH of lumbar spinal stenosis s/p lumbar laminectomy, lumbar fusion, lumbar wound debridement on 07/23/14, HTN, DM, gout, leukemia, BPH. Pt presents with worsening fever and back pain. CT scan was done which shows evidence for discitis. There is loosening of the L1 and L3 pedicle screws. All these findings are concerning for infection of hardware.  Clinical Impression  Pt admitted with above diagnosis. Pt currently with functional limitations due to the deficits listed below (see PT Problem List). At the time of PT eval pt was able to perform transfers and ambulation with RW and min guard assist. Pt was educated on back precautions for comfort, and encouraged to sit up in bedside chair today. Pt will benefit from skilled PT to increase their independence and safety with mobility to allow discharge to the venue listed below.       Follow Up Recommendations Home health PT;Supervision for mobility/OOB (Vs. No follow up depending on progress with PT)    Equipment Recommendations  None recommended by PT    Recommendations for Other Services       Precautions / Restrictions Precautions Precautions: Fall;Back Precaution Booklet Issued: No Precaution Comments: Reviewed back precautions for comfort. Pt is familiar from previous surgeries. Encouraged log roll and sitting upright with feet on floor to limit strain on low back.  Restrictions Weight Bearing Restrictions: No      Mobility  Bed Mobility Overal bed mobility: Needs Assistance Bed Mobility: Supine to Sit     Supine to sit: Supervision     General bed mobility comments: No physical assist required. HOB was elevated and pt scooted around to sitting EOB. Encouraged log roll in the future when laying flatter.   Transfers Overall transfer level: Needs  assistance Equipment used: Rolling walker (2 wheeled) Transfers: Sit to/from Stand Sit to Stand: Min guard         General transfer comment: Increased time required to achieve full standing. Hands-on guarding provided as pt appeared to have difficulty powering up.   Ambulation/Gait Ambulation/Gait assistance: Min guard Ambulation Distance (Feet): 200 Feet Assistive device: Rolling walker (2 wheeled) Gait Pattern/deviations: Decreased stride length;Shuffle;Trunk flexed Gait velocity: Decreased Gait velocity interpretation: Below normal speed for age/gender General Gait Details: VC's for improved posture, increased floor clearance, and walker placement closer to pt's body. Overall steady, however it appears that pt is labored in ambulating.   Stairs            Wheelchair Mobility    Modified Rankin (Stroke Patients Only)       Balance Overall balance assessment: Needs assistance Sitting-balance support: Feet supported;No upper extremity supported Sitting balance-Leahy Scale: Fair     Standing balance support: No upper extremity supported;During functional activity Standing balance-Leahy Scale: Fair Standing balance comment: Pt able to stand statically without UE support to use urinal.                              Pertinent Vitals/Pain Pain Assessment: Faces Faces Pain Scale: Hurts even more Pain Location: Low back. Pt states at rest (lying or sitting) he has no pain, but pain begins when he gets up and starts moving around.  Pain Descriptors / Indicators: Grimacing;Guarding Pain Intervention(s): Limited activity within patient's tolerance;Monitored during session;Repositioned    Home Living Family/patient expects  to be discharged to:: Private residence Living Arrangements: Alone Available Help at Discharge: Friend(s);Available 24 hours/day Type of Home: House Home Access: Stairs to enter Entrance Stairs-Rails: Right Entrance Stairs-Number of Steps:  4 Home Layout: One level Home Equipment: Walker - 2 wheels;Cane - single point Additional Comments: Pt states he cannot use the RW in the house due to space, and typically uses 2 SPC's instead.     Prior Function Level of Independence: Independent with assistive device(s)               Hand Dominance   Dominant Hand: Right    Extremity/Trunk Assessment   Upper Extremity Assessment: Overall WFL for tasks assessed           Lower Extremity Assessment: Generalized weakness (Especially in hip flexors)      Cervical / Trunk Assessment: Kyphotic (vs. forward head/rounded shoulders)  Communication   Communication: No difficulties  Cognition Arousal/Alertness: Awake/alert Behavior During Therapy: WFL for tasks assessed/performed Overall Cognitive Status: Within Functional Limits for tasks assessed                      General Comments      Exercises        Assessment/Plan    PT Assessment Patient needs continued PT services  PT Diagnosis Difficulty walking;Acute pain   PT Problem List Decreased strength;Decreased range of motion;Decreased activity tolerance;Decreased balance;Decreased mobility;Decreased knowledge of use of DME;Decreased safety awareness;Decreased knowledge of precautions;Pain  PT Treatment Interventions DME instruction;Gait training;Stair training;Functional mobility training;Therapeutic activities;Therapeutic exercise;Neuromuscular re-education;Patient/family education   PT Goals (Current goals can be found in the Care Plan section) Acute Rehab PT Goals Patient Stated Goal: Return home PT Goal Formulation: With patient Time For Goal Achievement: 02/04/15 Potential to Achieve Goals: Good    Frequency Min 3X/week   Barriers to discharge        Co-evaluation               End of Session Equipment Utilized During Treatment: Gait belt Activity Tolerance: Patient tolerated treatment well Patient left: in chair;with call  bell/phone within reach Nurse Communication: Mobility status         Time: 2549-8264 PT Time Calculation (min) (ACUTE ONLY): 26 min   Charges:   PT Evaluation $Initial PT Evaluation Tier I: 1 Procedure PT Treatments $Gait Training: 8-22 mins   PT G Codes:        Rolinda Roan 30-Jan-2015, 8:46 AM  Rolinda Roan, PT, DPT Acute Rehabilitation Services Pager: [phone]

## 2015-01-28 NOTE — Progress Notes (Signed)
[facility] PROGRESS NOTE  Kevin Rogers HWE:[id number] DOB: 1931-11-13 DOA: 01/26/2015 PCP: Garret Reddish, MD  Assessment/Plan: Principal Problem:   Lumbar discitis - Awaiting for input from Dr. Hal Neer - ID consulted - antibiotic regimen per ID recommendations - MRI suspicious for discitis or osteomyelitis  Active Problems:   Chronic lymphocytic leukemia   Diabetes mellitus type II, controlled - Pt on carb modified diet - continue SSI   B12 deficiency   Gout   Diabetic polyneuropathy   Glaucoma   Essential hypertension   BPH (benign prostatic hyperplasia)   Lumbar spinal stenosis   CKD (chronic kidney disease), stage III   Hyperlipidemia  Code Status: full Family Communication: no family at bedside Disposition Plan: Pending improvement in condition   Consultants:  Infectious Disease  Dr. Hal Neer   Procedures:  None  Antibiotics:  Vancomycin  Rifampin  Zosyn  HPI/Subjective: Pt has no new complaints. No acute issues overnight.  Objective: Filed Vitals:   01/28/15 1408  BP: 120/55  Pulse: 78  Temp: 98.6 F (37 C)  Resp: 18    Intake/Output Summary (Last 24 hours) at 01/28/15 1803 Last data filed at 01/28/15 1230  Gross per 24 hour  Intake    360 ml  Output   2250 ml  Net  -1890 ml   Filed Weights   01/26/15 2017 01/26/15 2245  Weight: 108.41 kg (239 lb) 105.49 kg (232 lb 9 oz)    Exam:   General:  Pt in nad, alert and awake  Cardiovascular: rrr, no mrg  Respiratory: cta bl, no wheezes  Abdomen: soft, nd, nt  Musculoskeletal: no cyanosis or clubbing   Data Reviewed: Basic Metabolic Panel:  Recent Labs Lab 01/26/15 2128 01/27/15 0556 01/28/15 0542  NA 137 138 139  K 4.4 4.0 4.2  CL 101 105 107  CO2 25 25 24   GLUCOSE 184* 122* 126*  BUN 13 14 12   CREATININE 1.41* 1.32* 1.36*  CALCIUM 9.8 8.8* 9.1   Liver Function Tests:  Recent Labs Lab 01/26/15 2128  AST 24  ALT 15*  ALKPHOS 89  BILITOT 0.8  PROT  7.5  ALBUMIN 3.6   No results for input(s): LIPASE, AMYLASE in the last 168 hours. No results for input(s): AMMONIA in the last 168 hours. CBC:  Recent Labs Lab 01/26/15 2128 01/27/15 0556 01/28/15 0542  WBC 14.6* 12.6* 12.3*  NEUTROABS 6.3  --   --   HGB 12.8* 10.6* 10.6*  HCT 38.3* 32.3* 32.5*  MCV 84.7 85.7 85.5  PLT 298 248 263   Cardiac Enzymes: No results for input(s): CKTOTAL, CKMB, CKMBINDEX, TROPONINI in the last 168 hours. BNP (last 3 results) No results for input(s): BNP in the last 8760 hours.  ProBNP (last 3 results) No results for input(s): PROBNP in the last 8760 hours.  CBG:  Recent Labs Lab 01/27/15 1635 01/27/15 2058 01/28/15 0625 01/28/15 1108 01/28/15 1620  GLUCAP 101* 154* 117* 116* 128*    Recent Results (from the past 240 hour(s))  Culture, blood (routine x 2)     Status: None (Preliminary result)   Collection Time: 01/26/15  9:28 PM  Result Value Ref Range Status   Specimen Description BLOOD LEFT ANTECUBITAL  Final   Special Requests BOTTLES DRAWN AEROBIC AND ANAEROBIC 5CC   Final   Culture  Setup Time   Final    GRAM POSITIVE COCCI IN CLUSTERS BOTTLES DRAWN AEROBIC ONLY CRITICAL RESULT CALLED TO, READ BACK BY AND VERIFIED WITH: STASCHA @0012  01/28/15 MKELLY  Culture GRAM POSITIVE COCCI  Final   Report Status PENDING  Incomplete  Culture, blood (routine x 2)     Status: None (Preliminary result)   Collection Time: 01/26/15  9:39 PM  Result Value Ref Range Status   Specimen Description BLOOD LEFT HAND  Final   Special Requests BOTTLES DRAWN AEROBIC AND ANAEROBIC 5CC EA  Final   Culture NO GROWTH 2 DAYS  Final   Report Status PENDING  Incomplete  Urine culture     Status: None   Collection Time: 01/26/15 10:17 PM  Result Value Ref Range Status   Specimen Description URINE, CLEAN CATCH  Final   Special Requests NONE  Final   Culture MULTIPLE SPECIES PRESENT, SUGGEST RECOLLECTION  Final   Report Status 01/28/2015 FINAL  Final   Culture, blood (routine x 2)     Status: None (Preliminary result)   Collection Time: 01/28/15  4:23 PM  Result Value Ref Range Status   Specimen Description BLOOD LEFT HAND  Final   Special Requests BOTTLES DRAWN AEROBIC AND ANAEROBIC 5CC  Final   Culture PENDING  Incomplete   Report Status PENDING  Incomplete     Studies: Dg Chest 2 View  01/26/2015   CLINICAL DATA:  Back pain tonight with fever.  Recent back surgery  EXAM: CHEST  2 VIEW  COMPARISON:  January 01, 2013  FINDINGS: The heart size and mediastinal contours are within normal limits. The aorta is tortuous. There is no focal infiltrate, pulmonary edema, or pleural effusion. The visualized skeletal structures are unremarkable.  IMPRESSION: No active cardiopulmonary disease.   Electronically Signed   By: Abelardo Diesel M.D.   On: 01/26/2015 22:35   Dg Lumbar Spine Complete  01/27/2015   CLINICAL DATA:  Lower back pain.  Recent back surgery.  EXAM: LUMBAR SPINE - COMPLETE 4+ VIEW  COMPARISON:  09/09/2014  FINDINGS: Recent L1-2 and L2-3 posterior lumbar interbody fusion with rod and pedicle screw fixation. Since comparison radiograph, there has been superior endplate height loss with narrowing between the endplates and pedicle screws. No hardware fracture or displacement. Intervertebral cages are located, including pre-existing cage at the L3-4 level.  Disc and facet degeneration is severe and diffuse, including at T12-L1 where there is chronic endplate erosion.  IMPRESSION: 1. L1 and L2 superior endplate fractures which have developed since 09/09/2014. 2. L1-2 thru L3-4 PLIF with posterior fixation hardware from L1-L3. No hardware displacement.   Electronically Signed   By: Monte Fantasia M.D.   On: 01/27/2015 00:07   Ct Lumbar Spine Wo Contrast  01/27/2015   CLINICAL DATA:  Severe back pain. Fever and sepsis of unclear etiology.  EXAM: CT LUMBAR SPINE WITHOUT CONTRAST  TECHNIQUE: Multidetector CT imaging of the lumbar spine was performed  without intravenous contrast administration. Multiplanar CT image reconstructions were also generated.  COMPARISON:  CT myelogram 05/13/2014  FINDINGS: Spinal numbering as previously established.  L1-2 and L2-3 posterior lumbar fusion with rod and pedicle screw fixation. Pre-existing L3-4 PLIF with associated posterior fusion hardware removed since the comparison study.  Wide discectomy space at L1-2 with L2 superior endplate fracture (most convincing on coronal reformats) and excessive subsidence that appears chronic, but has occurred since 09/09/2014 radiography. There has also been progressive subsidence into the L1 inferior endplate. The associated endplates are eroded, with underlying sclerosis, even greater than expected based on extensive preoperative subchondral irregularity. Wide lucency around the L1 and L3 pedicle screws, consistent with loosening. No graft displacement.  No L1  superior endplate fracture as suspected on prior radiography. T12-L1 disc narrowing and subchondral erosive changes similar to preoperative myelogram.  Perispinal edematous change, best visualized at the level of T12-L1, but likely also at the L1-L2 level - increasing concern for postoperative infection.  No gross canal or incisional fluid collection.  No progressive degenerative stenosis compared to CT myelogram comparison.  IMPRESSION: 1. Progressive widening of the L1-2 discectomy site with chronic appearing L2 superior endplate fracture and excessive subsidence. Appearance and history concerning for postoperative discitis, MRI with contrast recommended. 2. Rapid loosening of L1 and L3 pedicle screws. 3. No L1 superior endplate fracture as questioned on previous radiography. 4. No acute finding at the L2-3 and L3-4 discectomy sites. No solid bony fusion.   Electronically Signed   By: Monte Fantasia M.D.   On: 01/27/2015 02:45   Mr Lumbar Spine W Wo Contrast  01/27/2015   CLINICAL DATA:  Back surgery in February 2016 for  adjacent segment disease at L1-L2. Subsequent re-exploration 07/23/2014 for seroma. Now with severe back pain and fever. Elevated white count. History of diabetes mellitus.  EXAM: MRI LUMBAR SPINE WITHOUT AND WITH CONTRAST  TECHNIQUE: Multiplanar and multiecho pulse sequences of the lumbar spine were obtained without and with intravenous contrast.  CONTRAST:  60mL MULTIHANCE GADOBENATE DIMEGLUMINE 529 MG/ML IV SOLN  COMPARISON:  CT myelogram from 05/13/2014 is correlated. Also prior MRI from 01/01/2014. CT lumbar spine without contrast 01/27/2015.  FINDINGS: Segmentation: 5 lumbar vertebrae.  Alignment:  Trace anterolisthesis L3-4.  Vertebrae: Recent (2/16) surgery for L1 through L3 decompression, with posterior and interbody fusion using interbody cages and pedicle screw and rod construct. Abnormal vertebral body edema and enhancement above and below the L1-2 interbody cage. In conjunction with the CT appearance of widening of the interspace and endplate erosion, concern for L1-2 diskitis and regional osteomyelitis.  Conus medullaris: Normal in size, signal, and location.  Paraspinal tissues: No evidence for hydronephrosis or paravertebral mass. There is is edema of the RIGHT greater than LEFT psoas muscle, adjacent to the L1-2 interspace, although no abscess is observed.Well-healed posterior midline incision.  Disc levels:  L1-L2: Interbody cages float in the interspace, surrounded by T2 hyperintense and enhancing material consistent with diskitis. No cage migration, but shallow ventral epidural mass effect is present, not resulting in significant stenosis. Regional edema of the L1 and L2 vertebrae. Unclear if L2 nerve root displacement is present. BILATERAL foraminal narrowing could affect either L1 nerve root.  L2-L3: Developing arthrodesis. No concerning features. No impingement.  L3-L4: Prior XLIF. No definite solid arthrodesis, but no concerning features for infection. Wide posterior decompression. No  residual impingement.  L4-L5: Advanced disc space narrowing. Central disc osteophyte complex. Adequate posterior decompression. No impingement.  L5-S1: Central disc osteophyte complex. Facet arthropathy. No impingement.  Compared with the preoperative MR, the stenosis at L1-2 is improved.  IMPRESSION: When correlated with the recent CT, features are concerning for L1-2 diskitis and osteomyelitis. If there is no organism identified from blood cultures, L1-2 disc space aspiration could be performed.  Shallow ventral soft tissue abnormality at L1-2, without significant stenosis or concern for frank epidural abscess. Regional RIGHT psoas edema is observed.   Electronically Signed   By: Staci Righter M.D.   On: 01/27/2015 19:24    Scheduled Meds: . allopurinol  300 mg Oral Daily  . heparin  5,000 Units Subcutaneous 3 times per day  . insulin aspart  0-9 Units Subcutaneous TID WC  . latanoprost  1 drop  Both Eyes QHS  . piperacillin-tazobactam (ZOSYN)  IV  3.375 g Intravenous Q8H  . rifampin  300 mg Oral Daily  . sodium chloride  3 mL Intravenous Q12H  . tamsulosin  0.4 mg Oral Daily  . vancomycin  1,250 mg Intravenous Q24H  . vitamin B-12  1,000 mcg Oral Daily   Continuous Infusions: . sodium chloride 75 mL/hr at 01/28/15 0537     Time spent: > 35 minutes    Velvet Bathe  [facility] Pager [phone]. If 7PM-7AM, please contact night-coverage at [email or web address], password [facility] 01/28/2015, 6:03 PM  LOS: 1 day

## 2015-01-28 NOTE — Consult Note (Addendum)
[facility]  Date of Admission:  01/26/2015  Date of Consult:  01/28/2015  Reason for Consult: Discitis Referring Physician: Wendee Beavers  Impression/Recommendation Discitis, osteomyelitis with hardware  Lumbar fusion 12-2012  Laminectomy 06-2014  Dehisc, debride 07-23-14   Would- start rifampin in addition to his vanco  Pt counseled about rifampin side effects (color of urine, sputum, tears)  Stop zosyn  IR to eval, neurosurgery has eval.   GPC in bcx  Will repeat his BCx  Could consider TEE  CRI stage 2 DM2  F/u A1C per primary  Fairly well controlled (154-101)  LLE swelling  Check LLE doppler  Therapeutic drug monitoring  Will watch his Cr  Watch his vanco levels with pharmacy  Thank you so much for this interesting consult,   Bobby Rumpf (pager) [phone] [email or web address]  Kevin Rogers is an 81 y.o. male.  HPI: 81 yo M with hx of DM2 (since 1997), previous lumbar fusion 12-2012, laminectomy 06-24-14 and then debridement of wound (after dehisc) 07-23-2014. His wound Cx was (-) and he received vanco for 2 weeks.  He now returns with worsening back pain for 5 days and fever for 2 days. His back pain had progressed to the point he was unable to get out of bed without assist.  He was found in ED to have temp 102.5, WBC 14.6. He underwent MRI of back which showed:  When correlated with the recent CT, features are concerning for L1-2 diskitis and osteomyelitis. If there is no organism identified from blood cultures, L1-2 disc space aspiration could be performed. Shallow ventral soft tissue abnormality at L1-2, without significant stenosis or concern for frank epidural abscess. Regional RIGHT psoas edema is observed.  On adm he was started on vanco/zosyn.  1/2 BCx+ for [facility]  Past Medical History  Diagnosis Date  . Diabetes mellitus   . Hyperlipidemia   . Hypertension   . Leukemia, chronic 2001    does not take treatment just has  blood levels checked once a year  . Glaucoma     left eye  . Gout   . Acute bronchitis 09/12/2007    Qualifier: Diagnosis of  By: [name] MD, [name]     Past Surgical History  Procedure Laterality Date  . Lumbar laminectomy  1997  . Hernia repair  1989  . Tonsillectomy    . Colonoscopy w/ polypectomy    . Dental surgery      implanted teeth x 2  . Anterior lat lumbar fusion Left 01/03/2013    Procedure: ANTERIOR LATERAL LUMBAR FUSION lumbar three-four;  Surgeon: [name], MD;  Location: [facility];  Service: Neurosurgery;  Laterality: Left;  . Lumbar percutaneous pedicle screw 1 level  01/03/2013    Procedure: LUMBAR PERCUTANEOUS PEDICLE SCREWS LUMBAR THREE-FOUR;  Surgeon: [name], MD;  Location: [facility];  Service: Neurosurgery;;  . Back surgery      lumbar fusion - 2016, feb  . Lumbar wound debridement N/A 07/23/2014    Procedure: LUMBAR WOUND DEBRIDEMENT;  Surgeon: [name], MD;  Location: [facility];  Service: Neurosurgery;  Laterality: N/A;     No Known Allergies  Medications:  Scheduled: . allopurinol  300 mg Oral Daily  . heparin  5,000 Units Subcutaneous 3 times per day  . insulin aspart  0-9 Units Subcutaneous TID WC  . latanoprost  1 drop Both Eyes QHS  . piperacillin-tazobactam (ZOSYN)  IV  3.375 g Intravenous Q8H  .  sodium chloride  3 mL Intravenous Q12H  . tamsulosin  0.4 mg Oral Daily  . vancomycin  1,250 mg Intravenous Q24H  . vitamin B-12  1,000 mcg Oral Daily    Abtx:  Anti-infectives    Start     Dose/Rate Route Frequency Ordered Stop   01/27/15 1200  vancomycin (VANCOCIN) 1,250 mg in sodium chloride 0.9 % 250 mL IVPB     1,250 mg 166.7 mL/hr over 90 Minutes Intravenous Every 24 hours 01/27/15 0210     01/27/15 0600  piperacillin-tazobactam (ZOSYN) IVPB 3.375 g     3.375 g 12.5 mL/hr over 240 Minutes Intravenous Every 8 hours 01/27/15 0210     01/27/15 0145  piperacillin-tazobactam (ZOSYN) IVPB 3.375 g  Status:  Discontinued       3.375 g 100 mL/hr over 30 Minutes Intravenous 3 times per day 01/27/15 0138 01/27/15 0206   01/27/15 0015  vancomycin (VANCOCIN) IVPB 1000 mg/200 mL premix     1,000 mg 200 mL/hr over 60 Minutes Intravenous  Once 01/27/15 0005 01/27/15 0159   01/27/15 0015  piperacillin-tazobactam (ZOSYN) IVPB 3.375 g     3.375 g 100 mL/hr over 30 Minutes Intravenous  Once 01/27/15 0005 01/27/15 0150      Total days of antibiotics: 1 vanco/zosyn          Social History:  reports that he quit smoking about 19 years ago. His smoking use included Cigarettes. He has a 5 pack-year smoking history. He has never used smokeless tobacco. He reports that he drinks about 0.6 oz of alcohol per week. He reports that he does not use illicit drugs.  Family History  Problem Relation Age of Onset  . Heart disease Mother     79, second hand  . Heart disease Father     33, former smoker    General ROS: has had ophtho this year, no vision change, no dysphagia, no sob, normal BM, normal urine, no LE numbness, no LE weakness.  Please see HPI. 12 point ROS o/w (-)  Blood pressure 120/55, pulse 78, temperature 98.6 F (37 C), temperature source Oral, resp. rate 18, height 6' (1.829 m), weight 105.49 kg (232 lb 9 oz), SpO2 98 %. General appearance: alert, cooperative and no distress Eyes: negative findings: conjunctivae and sclerae normal and pupils equal, round, reactive to light and accomodation, B arcus senilus Throat: normal findings: oropharynx pink & moist without lesions or evidence of thrush Neck: no adenopathy and supple, symmetrical, trachea midline Lungs: clear to auscultation bilaterally Heart: regular rate and rhythm Abdomen: normal findings: bowel sounds normal and soft, non-tender Extremities: edema slight in LLE. slight warmth in LLE Neurologic: Sensory: normal to light touch in BLE  No diabetic foot lesions Lumbar wound is well healed but warm. Non-fluctuant.    Results for orders placed or  performed during the hospital encounter of 01/26/15 (from the past 48 hour(s))  Comprehensive metabolic panel     Status: Abnormal   Collection Time: 01/26/15  9:28 PM  Result Value Ref Range   Sodium 137 135 - 145 mmol/L   Potassium 4.4 3.5 - 5.1 mmol/L   Chloride 101 101 - 111 mmol/L   CO2 25 22 - 32 mmol/L   Glucose, Bld 184 (H) 65 - 99 mg/dL   BUN 13 6 - 20 mg/dL   Creatinine, Ser 1.41 (H) 0.61 - 1.24 mg/dL   Calcium 9.8 8.9 - 10.3 mg/dL   Total Protein 7.5 6.5 - 8.1 g/dL  Albumin 3.6 3.5 - 5.0 g/dL   AST 24 15 - 41 U/L   ALT 15 (L) 17 - 63 U/L   Alkaline Phosphatase 89 38 - 126 U/L   Total Bilirubin 0.8 0.3 - 1.2 mg/dL   GFR calc non Af Amer 45 (L) >60 mL/min   GFR calc Af Amer 53 (L) >60 mL/min    Comment: (NOTE) The eGFR has been calculated using the CKD EPI equation. This calculation has not been validated in all clinical situations. eGFR's persistently <60 mL/min signify possible Chronic Kidney Disease.    Anion gap 11 5 - 15  CBC with Differential     Status: Abnormal   Collection Time: 01/26/15  9:28 PM  Result Value Ref Range   WBC 14.6 (H) 4.0 - 10.5 K/uL   RBC 4.52 4.22 - 5.81 MIL/uL   Hemoglobin 12.8 (L) 13.0 - 17.0 g/dL   HCT 38.3 (L) 39.0 - 52.0 %   MCV 84.7 78.0 - 100.0 fL   MCH 28.3 26.0 - 34.0 pg   MCHC 33.4 30.0 - 36.0 g/dL   RDW 15.3 11.5 - 15.5 %   Platelets 298 150 - 400 K/uL   Neutrophils Relative % 43 %   Lymphocytes Relative 55 %   Monocytes Relative 2 %   Eosinophils Relative 0 %   Basophils Relative 0 %   Neutro Abs 6.3 1.7 - 7.7 K/uL   Lymphs Abs 8.0 (H) 0.7 - 4.0 K/uL   Monocytes Absolute 0.3 0.1 - 1.0 K/uL   Eosinophils Absolute 0.0 0.0 - 0.7 K/uL   Basophils Absolute 0.0 0.0 - 0.1 K/uL   RBC Morphology TARGET CELLS     Comment: ELLIPTOCYTES  Culture, blood (routine x 2)     Status: None (Preliminary result)   Collection Time: 01/26/15  9:28 PM  Result Value Ref Range   Specimen Description BLOOD LEFT ANTECUBITAL    Special  Requests BOTTLES DRAWN AEROBIC AND ANAEROBIC 5CC     Culture  Setup Time      GRAM POSITIVE COCCI IN CLUSTERS BOTTLES DRAWN AEROBIC ONLY CRITICAL RESULT CALLED TO, READ BACK BY AND VERIFIED WITH: STASCHA _0  01/28/15 MKELLY    Culture GRAM POSITIVE COCCI    Report Status PENDING   Culture, blood (routine x 2)     Status: None (Preliminary result)   Collection Time: 01/26/15  9:39 PM  Result Value Ref Range   Specimen Description BLOOD LEFT HAND    Special Requests BOTTLES DRAWN AEROBIC AND ANAEROBIC 5CC EA    Culture NO GROWTH 2 DAYS    Report Status PENDING   I-Stat CG4 Lactic Acid, ED (Not at [facility])     Status: Abnormal   Collection Time: 01/26/15  9:45 PM  Result Value Ref Range   Lactic Acid, Venous 2.21 (HH) 0.5 - 2.0 mmol/L   Comment NOTIFIED PHYSICIAN   Urinalysis, Routine w reflex microscopic (not at [facility])     Status: None   Collection Time: 01/26/15 10:17 PM  Result Value Ref Range   Color, Urine YELLOW YELLOW   APPearance CLEAR CLEAR   Specific Gravity, Urine 1.014 1.005 - 1.030   pH 6.0 5.0 - 8.0   Glucose, UA NEGATIVE NEGATIVE mg/dL   Hgb urine dipstick NEGATIVE NEGATIVE   Bilirubin Urine NEGATIVE NEGATIVE   Ketones, ur NEGATIVE NEGATIVE mg/dL   Protein, ur NEGATIVE NEGATIVE mg/dL   Urobilinogen, UA 1.0 0.0 - 1.0 mg/dL   Nitrite NEGATIVE NEGATIVE   Leukocytes, UA  NEGATIVE NEGATIVE    Comment: MICROSCOPIC NOT DONE ON URINES WITH NEGATIVE PROTEIN, BLOOD, LEUKOCYTES, NITRITE, OR GLUCOSE <1000 mg/dL.  Urine culture     Status: None   Collection Time: 01/26/15 10:17 PM  Result Value Ref Range   Specimen Description URINE, CLEAN CATCH    Special Requests NONE    Culture MULTIPLE SPECIES PRESENT, SUGGEST RECOLLECTION    Report Status 01/28/2015 FINAL   I-Stat CG4 Lactic Acid, ED (Not at [facility])     Status: None   Collection Time: 01/27/15 12:14 AM  Result Value Ref Range   Lactic Acid, Venous 0.82 0.5 - 2.0 mmol/L  Sedimentation rate     Status: Abnormal    Collection Time: 01/27/15  3:16 AM  Result Value Ref Range   Sed Rate 61 (H) 0 - 16 mm/hr  C-reactive protein     Status: Abnormal   Collection Time: 01/27/15  3:16 AM  Result Value Ref Range   CRP 19.5 (H) <1.0 mg/dL  Procalcitonin     Status: None   Collection Time: 01/27/15  3:16 AM  Result Value Ref Range   Procalcitonin 0.19 ng/mL    Comment:        Interpretation: PCT (Procalcitonin) <= 0.5 ng/mL: Systemic infection (sepsis) is not likely. Local bacterial infection is possible. (NOTE)         ICU PCT Algorithm               Non ICU PCT Algorithm    ----------------------------     ------------------------------         PCT < 0.25 ng/mL                 PCT < 0.1 ng/mL     Stopping of antibiotics            Stopping of antibiotics       strongly encouraged.               strongly encouraged.    ----------------------------     ------------------------------       PCT level decrease by               PCT < 0.25 ng/mL       >= 80% from peak PCT       OR PCT 0.25 - 0.5 ng/mL          Stopping of antibiotics                                             encouraged.     Stopping of antibiotics           encouraged.    ----------------------------     ------------------------------       PCT level decrease by              PCT >= 0.25 ng/mL       < 80% from peak PCT        AND PCT >= 0.5 ng/mL            Continuin g antibiotics                                              encouraged.  Continuing antibiotics            encouraged.    ----------------------------     ------------------------------     PCT level increase compared          PCT > 0.5 ng/mL         with peak PCT AND          PCT >= 0.5 ng/mL             Escalation of antibiotics                                          strongly encouraged.      Escalation of antibiotics        strongly encouraged.   Protime-INR     Status: Abnormal   Collection Time: 01/27/15  3:16 AM  Result Value Ref Range   Prothrombin  Time 15.3 (H) 11.6 - 15.2 seconds   INR 1.19 0.00 - 1.49  APTT     Status: None   Collection Time: 01/27/15  3:16 AM  Result Value Ref Range   aPTT 33 24 - 37 seconds  Lactic acid, plasma     Status: None   Collection Time: 01/27/15  3:18 AM  Result Value Ref Range   Lactic Acid, Venous 1.2 0.5 - 2.0 mmol/L  Type and screen     Status: None   Collection Time: 01/27/15  3:30 AM  Result Value Ref Range   ABO/RH(D) O POS    Antibody Screen NEG    Sample Expiration 01/30/2015   Lactic acid, plasma     Status: None   Collection Time: 01/27/15  5:56 AM  Result Value Ref Range   Lactic Acid, Venous 0.8 0.5 - 2.0 mmol/L  Basic metabolic panel     Status: Abnormal   Collection Time: 01/27/15  5:56 AM  Result Value Ref Range   Sodium 138 135 - 145 mmol/L   Potassium 4.0 3.5 - 5.1 mmol/L   Chloride 105 101 - 111 mmol/L   CO2 25 22 - 32 mmol/L   Glucose, Bld 122 (H) 65 - 99 mg/dL   BUN 14 6 - 20 mg/dL   Creatinine, Ser 1.32 (H) 0.61 - 1.24 mg/dL   Calcium 8.8 (L) 8.9 - 10.3 mg/dL   GFR calc non Af Amer 49 (L) >60 mL/min   GFR calc Af Amer 57 (L) >60 mL/min    Comment: (NOTE) The eGFR has been calculated using the CKD EPI equation. This calculation has not been validated in all clinical situations. eGFR's persistently <60 mL/min signify possible Chronic Kidney Disease.    Anion gap 8 5 - 15  CBC     Status: Abnormal   Collection Time: 01/27/15  5:56 AM  Result Value Ref Range   WBC 12.6 (H) 4.0 - 10.5 K/uL   RBC 3.77 (L) 4.22 - 5.81 MIL/uL   Hemoglobin 10.6 (L) 13.0 - 17.0 g/dL   HCT 32.3 (L) 39.0 - 52.0 %   MCV 85.7 78.0 - 100.0 fL   MCH 28.1 26.0 - 34.0 pg   MCHC 32.8 30.0 - 36.0 g/dL   RDW 15.4 11.5 - 15.5 %   Platelets 248 150 - 400 K/uL  Glucose, capillary     Status: Abnormal   Collection Time: 01/27/15  6:16 AM  Result Value Ref Range   Glucose-Capillary 119 (H)  65 - 99 mg/dL  Glucose, capillary     Status: Abnormal   Collection Time: 01/27/15 11:14 AM  Result  Value Ref Range   Glucose-Capillary 105 (H) 65 - 99 mg/dL  Glucose, capillary     Status: Abnormal   Collection Time: 01/27/15  4:35 PM  Result Value Ref Range   Glucose-Capillary 101 (H) 65 - 99 mg/dL  Glucose, capillary     Status: Abnormal   Collection Time: 01/27/15  8:58 PM  Result Value Ref Range   Glucose-Capillary 154 (H) 65 - 99 mg/dL  CBC     Status: Abnormal   Collection Time: 01/28/15  5:42 AM  Result Value Ref Range   WBC 12.3 (H) 4.0 - 10.5 K/uL   RBC 3.80 (L) 4.22 - 5.81 MIL/uL   Hemoglobin 10.6 (L) 13.0 - 17.0 g/dL   HCT 32.5 (L) 39.0 - 52.0 %   MCV 85.5 78.0 - 100.0 fL   MCH 27.9 26.0 - 34.0 pg   MCHC 32.6 30.0 - 36.0 g/dL   RDW 15.3 11.5 - 15.5 %   Platelets 263 150 - 400 K/uL  Basic metabolic panel     Status: Abnormal   Collection Time: 01/28/15  5:42 AM  Result Value Ref Range   Sodium 139 135 - 145 mmol/L   Potassium 4.2 3.5 - 5.1 mmol/L   Chloride 107 101 - 111 mmol/L   CO2 24 22 - 32 mmol/L   Glucose, Bld 126 (H) 65 - 99 mg/dL   BUN 12 6 - 20 mg/dL   Creatinine, Ser 1.36 (H) 0.61 - 1.24 mg/dL   Calcium 9.1 8.9 - 10.3 mg/dL   GFR calc non Af Amer 48 (L) >60 mL/min   GFR calc Af Amer 55 (L) >60 mL/min    Comment: (NOTE) The eGFR has been calculated using the CKD EPI equation. This calculation has not been validated in all clinical situations. eGFR's persistently <60 mL/min signify possible Chronic Kidney Disease.    Anion gap 8 5 - 15  Glucose, capillary     Status: Abnormal   Collection Time: 01/28/15  6:25 AM  Result Value Ref Range   Glucose-Capillary 117 (H) 65 - 99 mg/dL  Glucose, capillary     Status: Abnormal   Collection Time: 01/28/15 11:08 AM  Result Value Ref Range   Glucose-Capillary 116 (H) 65 - 99 mg/dL   Comment 1 Notify RN    Comment 2 Document in Chart       Component Value Date/Time   SDES URINE, CLEAN CATCH 01/26/2015 2217   SPECREQUEST NONE 01/26/2015 2217   CULT MULTIPLE SPECIES PRESENT, SUGGEST RECOLLECTION  01/26/2015 2217   REPTSTATUS 01/28/2015 FINAL 01/26/2015 2217   Dg Chest 2 View  01/26/2015   CLINICAL DATA:  Back pain tonight with fever.  Recent back surgery  EXAM: CHEST  2 VIEW  COMPARISON:  January 01, 2013  FINDINGS: The heart size and mediastinal contours are within normal limits. The aorta is tortuous. There is no focal infiltrate, pulmonary edema, or pleural effusion. The visualized skeletal structures are unremarkable.  IMPRESSION: No active cardiopulmonary disease.   Electronically Signed   By: Abelardo Diesel M.D.   On: 01/26/2015 22:35   Dg Lumbar Spine Complete  01/27/2015   CLINICAL DATA:  Lower back pain.  Recent back surgery.  EXAM: LUMBAR SPINE - COMPLETE 4+ VIEW  COMPARISON:  09/09/2014  FINDINGS: Recent L1-2 and L2-3 posterior lumbar interbody fusion with rod and pedicle screw  fixation. Since comparison radiograph, there has been superior endplate height loss with narrowing between the endplates and pedicle screws. No hardware fracture or displacement. Intervertebral cages are located, including pre-existing cage at the L3-4 level.  Disc and facet degeneration is severe and diffuse, including at T12-L1 where there is chronic endplate erosion.  IMPRESSION: 1. L1 and L2 superior endplate fractures which have developed since 09/09/2014. 2. L1-2 thru L3-4 PLIF with posterior fixation hardware from L1-L3. No hardware displacement.   Electronically Signed   By: Monte Fantasia M.D.   On: 01/27/2015 00:07   Ct Lumbar Spine Wo Contrast  01/27/2015   CLINICAL DATA:  Severe back pain. Fever and sepsis of unclear etiology.  EXAM: CT LUMBAR SPINE WITHOUT CONTRAST  TECHNIQUE: Multidetector CT imaging of the lumbar spine was performed without intravenous contrast administration. Multiplanar CT image reconstructions were also generated.  COMPARISON:  CT myelogram 05/13/2014  FINDINGS: Spinal numbering as previously established.  L1-2 and L2-3 posterior lumbar fusion with rod and pedicle screw  fixation. Pre-existing L3-4 PLIF with associated posterior fusion hardware removed since the comparison study.  Wide discectomy space at L1-2 with L2 superior endplate fracture (most convincing on coronal reformats) and excessive subsidence that appears chronic, but has occurred since 09/09/2014 radiography. There has also been progressive subsidence into the L1 inferior endplate. The associated endplates are eroded, with underlying sclerosis, even greater than expected based on extensive preoperative subchondral irregularity. Wide lucency around the L1 and L3 pedicle screws, consistent with loosening. No graft displacement.  No L1 superior endplate fracture as suspected on prior radiography. T12-L1 disc narrowing and subchondral erosive changes similar to preoperative myelogram.  Perispinal edematous change, best visualized at the level of T12-L1, but likely also at the L1-L2 level - increasing concern for postoperative infection.  No gross canal or incisional fluid collection.  No progressive degenerative stenosis compared to CT myelogram comparison.  IMPRESSION: 1. Progressive widening of the L1-2 discectomy site with chronic appearing L2 superior endplate fracture and excessive subsidence. Appearance and history concerning for postoperative discitis, MRI with contrast recommended. 2. Rapid loosening of L1 and L3 pedicle screws. 3. No L1 superior endplate fracture as questioned on previous radiography. 4. No acute finding at the L2-3 and L3-4 discectomy sites. No solid bony fusion.   Electronically Signed   By: Monte Fantasia M.D.   On: 01/27/2015 02:45   Mr Lumbar Spine W Wo Contrast  01/27/2015   CLINICAL DATA:  Back surgery in February 2016 for adjacent segment disease at L1-L2. Subsequent re-exploration 07/23/2014 for seroma. Now with severe back pain and fever. Elevated white count. History of diabetes mellitus.  EXAM: MRI LUMBAR SPINE WITHOUT AND WITH CONTRAST  TECHNIQUE: Multiplanar and multiecho pulse  sequences of the lumbar spine were obtained without and with intravenous contrast.  CONTRAST:  61m MULTIHANCE GADOBENATE DIMEGLUMINE 529 MG/ML IV SOLN  COMPARISON:  CT myelogram from 05/13/2014 is correlated. Also prior MRI from 01/01/2014. CT lumbar spine without contrast 01/27/2015.  FINDINGS: Segmentation: 5 lumbar vertebrae.  Alignment:  Trace anterolisthesis L3-4.  Vertebrae: Recent (2/16) surgery for L1 through L3 decompression, with posterior and interbody fusion using interbody cages and pedicle screw and rod construct. Abnormal vertebral body edema and enhancement above and below the L1-2 interbody cage. In conjunction with the CT appearance of widening of the interspace and endplate erosion, concern for L1-2 diskitis and regional osteomyelitis.  Conus medullaris: Normal in size, signal, and location.  Paraspinal tissues: No evidence for hydronephrosis or paravertebral mass.  There is is edema of the RIGHT greater than LEFT psoas muscle, adjacent to the L1-2 interspace, although no abscess is observed.Well-healed posterior midline incision.  Disc levels:  L1-L2: Interbody cages float in the interspace, surrounded by T2 hyperintense and enhancing material consistent with diskitis. No cage migration, but shallow ventral epidural mass effect is present, not resulting in significant stenosis. Regional edema of the L1 and L2 vertebrae. Unclear if L2 nerve root displacement is present. BILATERAL foraminal narrowing could affect either L1 nerve root.  L2-L3: Developing arthrodesis. No concerning features. No impingement.  L3-L4: Prior XLIF. No definite solid arthrodesis, but no concerning features for infection. Wide posterior decompression. No residual impingement.  L4-L5: Advanced disc space narrowing. Central disc osteophyte complex. Adequate posterior decompression. No impingement.  L5-S1: Central disc osteophyte complex. Facet arthropathy. No impingement.  Compared with the preoperative MR, the stenosis at  L1-2 is improved.  IMPRESSION: When correlated with the recent CT, features are concerning for L1-2 diskitis and osteomyelitis. If there is no organism identified from blood cultures, L1-2 disc space aspiration could be performed.  Shallow ventral soft tissue abnormality at L1-2, without significant stenosis or concern for frank epidural abscess. Regional RIGHT psoas edema is observed.   Electronically Signed   By: Staci Righter M.D.   On: 01/27/2015 19:24   Recent Results (from the past 240 hour(s))  Culture, blood (routine x 2)     Status: None (Preliminary result)   Collection Time: 01/26/15  9:28 PM  Result Value Ref Range Status   Specimen Description BLOOD LEFT ANTECUBITAL  Final   Special Requests BOTTLES DRAWN AEROBIC AND ANAEROBIC 5CC   Final   Culture  Setup Time   Final    GRAM POSITIVE COCCI IN CLUSTERS BOTTLES DRAWN AEROBIC ONLY CRITICAL RESULT CALLED TO, READ BACK BY AND VERIFIED WITH: STASCHA _0  01/28/15 MKELLY    Culture GRAM POSITIVE COCCI  Final   Report Status PENDING  Incomplete  Culture, blood (routine x 2)     Status: None (Preliminary result)   Collection Time: 01/26/15  9:39 PM  Result Value Ref Range Status   Specimen Description BLOOD LEFT HAND  Final   Special Requests BOTTLES DRAWN AEROBIC AND ANAEROBIC 5CC EA  Final   Culture NO GROWTH 2 DAYS  Final   Report Status PENDING  Incomplete  Urine culture     Status: None   Collection Time: 01/26/15 10:17 PM  Result Value Ref Range Status   Specimen Description URINE, CLEAN CATCH  Final   Special Requests NONE  Final   Culture MULTIPLE SPECIES PRESENT, SUGGEST RECOLLECTION  Final   Report Status 01/28/2015 FINAL  Final      01/28/2015, 2:27 PM     LOS: 1 day    Records and images were personally reviewed where available.

## 2015-01-29 ENCOUNTER — Inpatient Hospital Stay (HOSPITAL_COMMUNITY): Payer: Medicare Other

## 2015-01-29 DIAGNOSIS — M7989 Other specified soft tissue disorders: Secondary | ICD-10-CM

## 2015-01-29 LAB — GLUCOSE, CAPILLARY
Glucose-Capillary: 127 mg/dL — ABNORMAL HIGH (ref 65–99)
Glucose-Capillary: 126 mg/dL — ABNORMAL HIGH (ref 65–99)
Glucose-Capillary: 91 mg/dL (ref 65–99)
Glucose-Capillary: 162 mg/dL — ABNORMAL HIGH (ref 65–99)

## 2015-01-29 LAB — CULTURE, BLOOD (ROUTINE X 2)

## 2015-01-29 MED ORDER — FENTANYL CITRATE (PF) 100 MCG/2ML IJ SOLN
INTRAMUSCULAR | Status: AC | PRN
  Administered 2015-01-29: 50 ug via INTRAVENOUS

## 2015-01-29 MED ORDER — MIDAZOLAM HCL 2 MG/2ML IJ SOLN
INTRAMUSCULAR | Status: AC | PRN
  Administered 2015-01-29: 1 mg via INTRAVENOUS

## 2015-01-29 MED ORDER — FENTANYL CITRATE (PF) 100 MCG/2ML IJ SOLN
INTRAMUSCULAR | Status: AC
  Filled 2015-01-29: qty 2

## 2015-01-29 MED ORDER — MIDAZOLAM HCL 2 MG/2ML IJ SOLN
INTRAMUSCULAR | Status: AC
  Filled 2015-01-29: qty 2

## 2015-01-29 MED ORDER — HEPARIN SODIUM (PORCINE) 5000 UNIT/ML IJ SOLN
5000.0000 [IU] | Freq: Three times a day (TID) | INTRAMUSCULAR | Status: DC
  Administered 2015-01-30 – 2015-02-04 (×16): 5000 [IU] via SUBCUTANEOUS
  Filled 2015-01-29 (×17): qty 1

## 2015-01-29 NOTE — Sedation Documentation (Signed)
Patient is resting comfortably. 

## 2015-01-29 NOTE — Consult Note (Signed)
Chief Complaint: Patient was seen in consultation today for back pain; diskitis Chief Complaint  Patient presents with  . Back Pain    The patient said he has chronic back pain and he started having severe back pain.  He is supposed to have a procedure tomorrow and he could not stand the pain anymore.     at the request of Dr Hatcher/Dr Wendee Beavers  Referring Physician(s): Dr Johnnye Sima  History of Present Illness: Kevin Rogers is a 81 y.o. male   Back pain worsening x 1 week Hx back surgeries: lumbar fusion 2014; laminectomy--07/2014 Now with lumbar discitis IMPRESSION: When correlated with the recent CT, features are concerning for L1-2 diskitis and osteomyelitis. If there is no organism identified from blood cultures, L1-2 disc space aspiration could be performed.  Shallow ventral soft tissue abnormality at L1-2, without significant stenosis or concern for frank epidural abscess. Regional RIGHT psoas edema is observed.  Request for L1-2 disc aspiration in IR Dr Anselm Pancoast has reviewed imaging and approves procedure I have seen and examined pt  Past Medical History  Diagnosis Date  . Diabetes mellitus type 2 with complications 7001  . Hyperlipidemia   . Hypertension   . Leukemia, chronic 2001    does not take treatment just has blood levels checked once a year  . Glaucoma     left eye  . Gout   . Acute bronchitis 09/12/2007    Qualifier: Diagnosis of  By: [name] MD, [name]     Past Surgical History  Procedure Laterality Date  . Lumbar laminectomy  1997  . Hernia repair  1989  . Tonsillectomy    . Colonoscopy w/ polypectomy    . Dental surgery      implanted teeth x 2  . Anterior lat lumbar fusion Left 01/03/2013    Procedure: ANTERIOR LATERAL LUMBAR FUSION lumbar three-four;  Surgeon: [name], MD;  Location: [facility];  Service: Neurosurgery;  Laterality: Left;  . Lumbar percutaneous pedicle screw 1 level  01/03/2013    Procedure: LUMBAR PERCUTANEOUS PEDICLE  SCREWS LUMBAR THREE-FOUR;  Surgeon: [name], MD;  Location: [facility];  Service: Neurosurgery;;  . Back surgery      lumbar fusion - 2016, feb  . Lumbar wound debridement N/A 07/23/2014    Procedure: LUMBAR WOUND DEBRIDEMENT;  Surgeon: [name], MD;  Location: [place] [facility];  Service: Neurosurgery;  Laterality: N/A;    Allergies: Review of patient's allergies indicates no known allergies.  Medications: Prior to Admission medications   Medication Sig Start Date End Date Taking? Authorizing Provider  acetaminophen (TYLENOL) 500 MG tablet Take 500-1,000 mg by mouth every 6 (six) hours as needed (pain).   Yes Historical Provider, MD  allopurinol (ZYLOPRIM) 300 MG tablet Take 1 tablet by mouth  daily 12/30/14  Yes [name], MD  glipiZIDE (GLUCOTROL XL) 5 MG 24 hr tablet Take 1 tablet (5 mg total) by mouth daily. 07/31/14  Yes [name], MD  losartan (COZAAR) 100 MG tablet Take 1 tablet by mouth  daily 09/02/14  Yes [name], MD  meloxicam [facility]) 15 MG tablet Take 1 tablet by mouth  daily 09/02/14  Yes [name], MD  nabumetone (RELAFEN) 500 MG tablet Take 500 mg by mouth 2 (two) times daily as needed. Stopped for procedure 01/13/15  Yes Historical Provider, MD  tamsulosin (FLOMAX) 0.4 MG CAPS capsule Take 1 capsule by mouth  daily 09/02/14  Yes Annie Main  Kristian Covey, MD  TRAVATAN Z 0.004 % SOLN ophthalmic solution Place 1 drop into both eyes at bedtime.  09/03/14  Yes Historical Provider, MD  vitamin B-12 (CYANOCOBALAMIN) 1000 MCG tablet Take 1,000 mcg by mouth daily.   Yes Historical Provider, MD  glucose blood (ONETOUCH VERIO) test strip Use to test blood sugars daily. Dx: E11.9 08/18/14   [name], MD  ONE TOUCH LANCETS MISC Use to test blood sugars. Dx:E11.9 08/18/14   [name], MD  oxyCODONE-acetaminophen (PERCOCET/ROXICET) 5-325 MG per tablet Take 1-2 tablets by mouth every 4 (four) hours as needed for moderate pain. Patient not taking: Reported on  01/13/2015 06/28/14   Consuella Lose, MD     Family History  Problem Relation Age of Onset  . Heart disease Mother     77, second hand  . Heart disease Father     33, former smoker    Social History   Social History  . Marital Status: Widowed    Spouse Name: N/A  . Number of Children: N/A  . Years of Education: N/A   Social History Main Topics  . Smoking status: Former Smoker -- 0.25 packs/day for 20 years    Types: Cigarettes    Quit date: 09/28/1995  . Smokeless tobacco: Never Used  . Alcohol Use: 0.6 oz/week    1 Glasses of wine per week     Comment: monthy  . Drug Use: No  . Sexual Activity: Yes   Other Topics Concern  . None   Social History Narrative   Widowed 2009. 2 sons. 2 grandkids-boy/girl. No greatgrandkids.    Single but dating-same person for a while, may get married again   Lives alone. Does everything for himself except cutting grass if back is hurting.       Retired from [employer]: golf (hard with back), watch tv (pain with moving around)    Review of Systems: A 12 point ROS discussed and pertinent positives are indicated in the HPI above.  All other systems are negative.  Review of Systems  Constitutional: Positive for fever and activity change. Negative for appetite change.  Gastrointestinal: Negative for abdominal pain.  Musculoskeletal: Positive for back pain.  Neurological: Positive for weakness.  Psychiatric/Behavioral: Negative for behavioral problems and confusion.    Vital Signs: BP 122/68 mmHg  Pulse 70  Temp(Src) 98.4 F (36.9 C) (Oral)  Resp 18  Ht 6' (1.829 m)  Wt 232 lb 9 oz (105.49 kg)  BMI 31.53 kg/m2  SpO2 95%  Physical Exam  Constitutional: He is oriented to person, place, and time.  Cardiovascular: Normal rate, regular rhythm and normal heart sounds.   Pulmonary/Chest: Effort normal and breath sounds normal. He has no wheezes.  Abdominal: Soft. Bowel sounds are normal. There is no tenderness.    Musculoskeletal: Normal range of motion.  Neurological: He is alert and oriented to person, place, and time.  Skin: Skin is warm and dry.  Psychiatric: He has a normal mood and affect. His behavior is normal. Judgment and thought content normal.  Nursing note and vitals reviewed.   Mallampati Score:  MD Evaluation Airway: WNL Heart: WNL Abdomen: WNL Chest/ Lungs: WNL ASA  Classification: 2 Mallampati/Airway Score: One  Imaging: Dg Chest 2 View  01/26/2015   CLINICAL DATA:  Back pain tonight with fever.  Recent back surgery  EXAM: CHEST  2 VIEW  COMPARISON:  January 01, 2013  FINDINGS: The heart size  and mediastinal contours are within normal limits. The aorta is tortuous. There is no focal infiltrate, pulmonary edema, or pleural effusion. The visualized skeletal structures are unremarkable.  IMPRESSION: No active cardiopulmonary disease.   Electronically Signed   By: Abelardo Diesel M.D.   On: 01/26/2015 22:35   Dg Lumbar Spine Complete  01/27/2015   CLINICAL DATA:  Lower back pain.  Recent back surgery.  EXAM: LUMBAR SPINE - COMPLETE 4+ VIEW  COMPARISON:  09/09/2014  FINDINGS: Recent L1-2 and L2-3 posterior lumbar interbody fusion with rod and pedicle screw fixation. Since comparison radiograph, there has been superior endplate height loss with narrowing between the endplates and pedicle screws. No hardware fracture or displacement. Intervertebral cages are located, including pre-existing cage at the L3-4 level.  Disc and facet degeneration is severe and diffuse, including at T12-L1 where there is chronic endplate erosion.  IMPRESSION: 1. L1 and L2 superior endplate fractures which have developed since 09/09/2014. 2. L1-2 thru L3-4 PLIF with posterior fixation hardware from L1-L3. No hardware displacement.   Electronically Signed   By: Monte Fantasia M.D.   On: 01/27/2015 00:07   Ct Lumbar Spine Wo Contrast  01/27/2015   CLINICAL DATA:  Severe back pain. Fever and sepsis of unclear  etiology.  EXAM: CT LUMBAR SPINE WITHOUT CONTRAST  TECHNIQUE: Multidetector CT imaging of the lumbar spine was performed without intravenous contrast administration. Multiplanar CT image reconstructions were also generated.  COMPARISON:  CT myelogram 05/13/2014  FINDINGS: Spinal numbering as previously established.  L1-2 and L2-3 posterior lumbar fusion with rod and pedicle screw fixation. Pre-existing L3-4 PLIF with associated posterior fusion hardware removed since the comparison study.  Wide discectomy space at L1-2 with L2 superior endplate fracture (most convincing on coronal reformats) and excessive subsidence that appears chronic, but has occurred since 09/09/2014 radiography. There has also been progressive subsidence into the L1 inferior endplate. The associated endplates are eroded, with underlying sclerosis, even greater than expected based on extensive preoperative subchondral irregularity. Wide lucency around the L1 and L3 pedicle screws, consistent with loosening. No graft displacement.  No L1 superior endplate fracture as suspected on prior radiography. T12-L1 disc narrowing and subchondral erosive changes similar to preoperative myelogram.  Perispinal edematous change, best visualized at the level of T12-L1, but likely also at the L1-L2 level - increasing concern for postoperative infection.  No gross canal or incisional fluid collection.  No progressive degenerative stenosis compared to CT myelogram comparison.  IMPRESSION: 1. Progressive widening of the L1-2 discectomy site with chronic appearing L2 superior endplate fracture and excessive subsidence. Appearance and history concerning for postoperative discitis, MRI with contrast recommended. 2. Rapid loosening of L1 and L3 pedicle screws. 3. No L1 superior endplate fracture as questioned on previous radiography. 4. No acute finding at the L2-3 and L3-4 discectomy sites. No solid bony fusion.   Electronically Signed   By: Monte Fantasia M.D.   On:  01/27/2015 02:45   Mr Lumbar Spine W Wo Contrast  01/27/2015   CLINICAL DATA:  Back surgery in February 2016 for adjacent segment disease at L1-L2. Subsequent re-exploration 07/23/2014 for seroma. Now with severe back pain and fever. Elevated white count. History of diabetes mellitus.  EXAM: MRI LUMBAR SPINE WITHOUT AND WITH CONTRAST  TECHNIQUE: Multiplanar and multiecho pulse sequences of the lumbar spine were obtained without and with intravenous contrast.  CONTRAST:  54mL MULTIHANCE GADOBENATE DIMEGLUMINE 529 MG/ML IV SOLN  COMPARISON:  CT myelogram from 05/13/2014 is correlated. Also prior MRI from  01/01/2014. CT lumbar spine without contrast 01/27/2015.  FINDINGS: Segmentation: 5 lumbar vertebrae.  Alignment:  Trace anterolisthesis L3-4.  Vertebrae: Recent (2/16) surgery for L1 through L3 decompression, with posterior and interbody fusion using interbody cages and pedicle screw and rod construct. Abnormal vertebral body edema and enhancement above and below the L1-2 interbody cage. In conjunction with the CT appearance of widening of the interspace and endplate erosion, concern for L1-2 diskitis and regional osteomyelitis.  Conus medullaris: Normal in size, signal, and location.  Paraspinal tissues: No evidence for hydronephrosis or paravertebral mass. There is is edema of the RIGHT greater than LEFT psoas muscle, adjacent to the L1-2 interspace, although no abscess is observed.Well-healed posterior midline incision.  Disc levels:  L1-L2: Interbody cages float in the interspace, surrounded by T2 hyperintense and enhancing material consistent with diskitis. No cage migration, but shallow ventral epidural mass effect is present, not resulting in significant stenosis. Regional edema of the L1 and L2 vertebrae. Unclear if L2 nerve root displacement is present. BILATERAL foraminal narrowing could affect either L1 nerve root.  L2-L3: Developing arthrodesis. No concerning features. No impingement.  L3-L4: Prior  XLIF. No definite solid arthrodesis, but no concerning features for infection. Wide posterior decompression. No residual impingement.  L4-L5: Advanced disc space narrowing. Central disc osteophyte complex. Adequate posterior decompression. No impingement.  L5-S1: Central disc osteophyte complex. Facet arthropathy. No impingement.  Compared with the preoperative MR, the stenosis at L1-2 is improved.  IMPRESSION: When correlated with the recent CT, features are concerning for L1-2 diskitis and osteomyelitis. If there is no organism identified from blood cultures, L1-2 disc space aspiration could be performed.  Shallow ventral soft tissue abnormality at L1-2, without significant stenosis or concern for frank epidural abscess. Regional RIGHT psoas edema is observed.   Electronically Signed   By: Staci Righter M.D.   On: 01/27/2015 19:24    Labs:  CBC:  Recent Labs  08/14/14 1309 01/26/15 2128 01/27/15 0556 01/28/15 0542  WBC 15.1* 14.6* 12.6* 12.3*  HGB 11.4* 12.8* 10.6* 10.6*  HCT 36.3* 38.3* 32.3* 32.5*  PLT 265 298 248 263    COAGS:  Recent Labs  01/27/15 0316  INR 1.19  APTT 33    BMP:  Recent Labs  07/23/14 0731  01/13/15 1058 01/26/15 2128 01/27/15 0556 01/28/15 0542  NA 141  < > 144 137 138 139  K 4.5  < > 4.4 4.4 4.0 4.2  CL 110  --  108 101 105 107  CO2 25  --  29 25 25 24   GLUCOSE 125*  < > 121* 184* 122* 126*  BUN 19  --  12 13 14 12   CALCIUM 9.4  --  9.6 9.8 8.8* 9.1  CREATININE 1.45*  --  1.27 1.41* 1.32* 1.36*  GFRNONAA 44*  --   --  45* 49* 48*  GFRAA 51*  --   --  53* 57* 55*  < > = values in this interval not displayed.  LIVER FUNCTION TESTS:  Recent Labs  02/12/14 0844 06/24/14 1119 01/26/15 2128  BILITOT 0.99 1.1 0.8  AST 20 17 24   ALT 20 14 15*  ALKPHOS 82 78 89  PROT 6.8 6.7 7.5  ALBUMIN 3.8 4.2 3.6    TUMOR MARKERS: No results for input(s): AFPTM, CEA, CA199, CHROMGRNA in the last 8760 hours.  Assessment and Plan:  Lumbar  diskitis Scheduled now for L1-2 disc aspiration Risks and Benefits discussed with the patient including, but not limited to  bleeding, infection, damage to adjacent structures or low yield requiring additional tests. All of the patient's questions were answered, patient is agreeable to proceed. Consent signed and in chart.   Thank you for this interesting consult.  I greatly enjoyed meeting Reo Portela and look forward to participating in their care.  A copy of this report was sent to the requesting provider on this date.  Signed: Shawnelle Spoerl A 01/29/2015, 8:04 AM   I spent a total of  40 minutes   in face to face in clinical consultation, greater than 50% of which was counseling/coordinating care for L1-2 disc aspiration

## 2015-01-29 NOTE — Progress Notes (Signed)
VASCULAR LAB PRELIMINARY  PRELIMINARY  PRELIMINARY  PRELIMINARY  Left lower extremity venous duplex completed.    Preliminary report:  There is no DVT or SVT noted in the left lower extremity.   Sylar Voong, RVT 01/29/2015, 10:31 AM

## 2015-01-29 NOTE — Progress Notes (Signed)
INFECTIOUS DISEASE PROGRESS NOTE  ID: Kevin Rogers is a 81 y.o. male with  Principal Problem:   Lumbar discitis Active Problems:   Chronic lymphocytic leukemia   Diabetes mellitus type II, controlled   B12 deficiency   Gout   Diabetic polyneuropathy   Glaucoma   Essential hypertension   BPH (benign prostatic hyperplasia)   Lumbar spinal stenosis   CKD (chronic kidney disease), stage III   Sepsis   Fever   Back pain   Hyperlipidemia  Subjective: Continued back pain  Abtx:  Anti-infectives    Start     Dose/Rate Route Frequency Ordered Stop   01/28/15 1530  rifampin (RIFADIN) capsule 300 mg     300 mg Oral Daily 01/28/15 1502     01/27/15 1200  vancomycin (VANCOCIN) 1,250 mg in sodium chloride 0.9 % 250 mL IVPB     1,250 mg 166.7 mL/hr over 90 Minutes Intravenous Every 24 hours 01/27/15 0210     01/27/15 0600  piperacillin-tazobactam (ZOSYN) IVPB 3.375 g     3.375 g 12.5 mL/hr over 240 Minutes Intravenous Every 8 hours 01/27/15 0210     01/27/15 0145  piperacillin-tazobactam (ZOSYN) IVPB 3.375 g  Status:  Discontinued     3.375 g 100 mL/hr over 30 Minutes Intravenous 3 times per day 01/27/15 0138 01/27/15 0206   01/27/15 0015  vancomycin (VANCOCIN) IVPB 1000 mg/200 mL premix     1,000 mg 200 mL/hr over 60 Minutes Intravenous  Once 01/27/15 0005 01/27/15 0159   01/27/15 0015  piperacillin-tazobactam (ZOSYN) IVPB 3.375 g     3.375 g 100 mL/hr over 30 Minutes Intravenous  Once 01/27/15 0005 01/27/15 0150      Medications:  Scheduled: . allopurinol  300 mg Oral Daily  . [START ON 01/30/2015] heparin  5,000 Units Subcutaneous 3 times per day  . insulin aspart  0-9 Units Subcutaneous TID WC  . latanoprost  1 drop Both Eyes QHS  . piperacillin-tazobactam (ZOSYN)  IV  3.375 g Intravenous Q8H  . rifampin  300 mg Oral Daily  . sodium chloride  3 mL Intravenous Q12H  . tamsulosin  0.4 mg Oral Daily  . vancomycin  1,250 mg Intravenous Q24H  . vitamin B-12  1,000 mcg  Oral Daily    Objective: Vital signs in last 24 hours: Temp:  [98.4 F (36.9 C)-98.6 F (37 C)] 98.4 F (36.9 C) (09/29 0459) Pulse Rate:  [70-82] 70 (09/29 0459) Resp:  [18] 18 (09/29 0459) BP: (120-126)/(55-72) 122/68 mmHg (09/29 0459) SpO2:  [95 %-98 %] 95 % (09/29 0459)   General appearance: alert, cooperative and no distress Resp: clear to auscultation bilaterally Cardio: regular rate and rhythm GI: normal findings: bowel sounds normal and soft, non-tender Extremities: edema no LE  Lab Results  Recent Labs  01/27/15 0556 01/28/15 0542  WBC 12.6* 12.3*  HGB 10.6* 10.6*  HCT 32.3* 32.5*  NA 138 139  K 4.0 4.2  CL 105 107  CO2 25 24  BUN 14 12  CREATININE 1.32* 1.36*   Liver Panel  Recent Labs  01/26/15 2128  PROT 7.5  ALBUMIN 3.6  AST 24  ALT 15*  ALKPHOS 89  BILITOT 0.8   Sedimentation Rate  Recent Labs  01/27/15 0316  ESRSEDRATE 61*   C-Reactive Protein  Recent Labs  01/27/15 0316  CRP 19.5*    Microbiology: Recent Results (from the past 240 hour(s))  Culture, blood (routine x 2)     Status: None (Preliminary result)  Collection Time: 01/26/15  9:28 PM  Result Value Ref Range Status   Specimen Description BLOOD LEFT ANTECUBITAL  Final   Special Requests BOTTLES DRAWN AEROBIC AND ANAEROBIC 5CC   Final   Culture  Setup Time   Final    GRAM POSITIVE COCCI IN CLUSTERS BOTTLES DRAWN AEROBIC ONLY CRITICAL RESULT CALLED TO, READ BACK BY AND VERIFIED WITH: STASCHA @0012  01/28/15 MKELLY    Culture GRAM POSITIVE COCCI  Final   Report Status PENDING  Incomplete  Culture, blood (routine x 2)     Status: None (Preliminary result)   Collection Time: 01/26/15  9:39 PM  Result Value Ref Range Status   Specimen Description BLOOD LEFT HAND  Final   Special Requests BOTTLES DRAWN AEROBIC AND ANAEROBIC 5CC EA  Final   Culture NO GROWTH 2 DAYS  Final   Report Status PENDING  Incomplete  Urine culture     Status: None   Collection Time: 01/26/15  10:17 PM  Result Value Ref Range Status   Specimen Description URINE, CLEAN CATCH  Final   Special Requests NONE  Final   Culture MULTIPLE SPECIES PRESENT, SUGGEST RECOLLECTION  Final   Report Status 01/28/2015 FINAL  Final  Culture, blood (routine x 2)     Status: None (Preliminary result)   Collection Time: 01/28/15  4:23 PM  Result Value Ref Range Status   Specimen Description BLOOD LEFT HAND  Final   Special Requests BOTTLES DRAWN AEROBIC AND ANAEROBIC 5CC  Final   Culture PENDING  Incomplete   Report Status PENDING  Incomplete    Studies/Results: Mr Lumbar Spine W Wo Contrast  01/27/2015   CLINICAL DATA:  Back surgery in February 2016 for adjacent segment disease at L1-L2. Subsequent re-exploration 07/23/2014 for seroma. Now with severe back pain and fever. Elevated white count. History of diabetes mellitus.  EXAM: MRI LUMBAR SPINE WITHOUT AND WITH CONTRAST  TECHNIQUE: Multiplanar and multiecho pulse sequences of the lumbar spine were obtained without and with intravenous contrast.  CONTRAST:  27mL MULTIHANCE GADOBENATE DIMEGLUMINE 529 MG/ML IV SOLN  COMPARISON:  CT myelogram from 05/13/2014 is correlated. Also prior MRI from 01/01/2014. CT lumbar spine without contrast 01/27/2015.  FINDINGS: Segmentation: 5 lumbar vertebrae.  Alignment:  Trace anterolisthesis L3-4.  Vertebrae: Recent (2/16) surgery for L1 through L3 decompression, with posterior and interbody fusion using interbody cages and pedicle screw and rod construct. Abnormal vertebral body edema and enhancement above and below the L1-2 interbody cage. In conjunction with the CT appearance of widening of the interspace and endplate erosion, concern for L1-2 diskitis and regional osteomyelitis.  Conus medullaris: Normal in size, signal, and location.  Paraspinal tissues: No evidence for hydronephrosis or paravertebral mass. There is is edema of the RIGHT greater than LEFT psoas muscle, adjacent to the L1-2 interspace, although no abscess  is observed.Well-healed posterior midline incision.  Disc levels:  L1-L2: Interbody cages float in the interspace, surrounded by T2 hyperintense and enhancing material consistent with diskitis. No cage migration, but shallow ventral epidural mass effect is present, not resulting in significant stenosis. Regional edema of the L1 and L2 vertebrae. Unclear if L2 nerve root displacement is present. BILATERAL foraminal narrowing could affect either L1 nerve root.  L2-L3: Developing arthrodesis. No concerning features. No impingement.  L3-L4: Prior XLIF. No definite solid arthrodesis, but no concerning features for infection. Wide posterior decompression. No residual impingement.  L4-L5: Advanced disc space narrowing. Central disc osteophyte complex. Adequate posterior decompression. No impingement.  L5-S1: Central disc  osteophyte complex. Facet arthropathy. No impingement.  Compared with the preoperative MR, the stenosis at L1-2 is improved.  IMPRESSION: When correlated with the recent CT, features are concerning for L1-2 diskitis and osteomyelitis. If there is no organism identified from blood cultures, L1-2 disc space aspiration could be performed.  Shallow ventral soft tissue abnormality at L1-2, without significant stenosis or concern for frank epidural abscess. Regional RIGHT psoas edema is observed.   Electronically Signed   By: Staci Righter M.D.   On: 01/27/2015 19:24     Assessment/Plan: Discitis, osteomyelitis with hardware Lumbar fusion 12-2012 Laminectomy 06-2014 Dehisc, debride 07-23-14  continue vanco/rifampin. Stop zosyn IR to eval, neurosurgery has eval. Aspirate today.   GPC in bcx await ID of organism  Will repeat his BCx Could consider TEE  CRI stage 2 DM2 F/u A1C per primary Fairly well controlled (154-101)  LLE swelling Check LLE doppler  Therapeutic drug  monitoring Will watch his Cr Watch his vanco levels with pharmacy   Total days of antibiotics: 2 zosyn/rifampin/vanco         Bobby Rumpf Infectious Diseases (pager) [phone] [email or web address] 01/29/2015, 9:57 AM  LOS: 2 days

## 2015-01-29 NOTE — Progress Notes (Signed)
[facility] PROGRESS NOTE  Kevin Rogers AJO:[id number] DOB: March 02, 1932 DOA: 01/26/2015 PCP: Garret Reddish, MD  Assessment/Plan: Principal Problem:   Lumbar discitis - Awaiting for input from Dr. Hal Neer - ID consulted - antibiotic regimen per ID recommendations - MRI suspicious for discitis or osteomyelitis - awaiting culture results.  Active Problems:   Chronic lymphocytic leukemia   Diabetes mellitus type II, controlled - Pt on carb modified diet - continue SSI   B12 deficiency   Gout   Diabetic polyneuropathy   Glaucoma   Essential hypertension   BPH (benign prostatic hyperplasia)   Lumbar spinal stenosis   CKD (chronic kidney disease), stage III   Hyperlipidemia  Code Status: full Family Communication: no family at bedside Disposition Plan: Pending further recommendations from specialist involved   Consultants:  Infectious Disease  Dr. Hal Neer   Procedures:  None  Antibiotics:  Vancomycin  Rifampin  Zosyn  HPI/Subjective: Pt has no new complaints. No acute issues overnight.  Objective: Filed Vitals:   01/29/15 1546  BP: 132/73  Pulse: 78  Temp:   Resp: 16    Intake/Output Summary (Last 24 hours) at 01/29/15 1840 Last data filed at 01/29/15 1415  Gross per 24 hour  Intake    480 ml  Output    390 ml  Net     90 ml   Filed Weights   01/26/15 2017 01/26/15 2245  Weight: 108.41 kg (239 lb) 105.49 kg (232 lb 9 oz)    Exam:   General:  Pt in nad, alert and awake  Cardiovascular: rrr, no mrg  Respiratory: cta bl, no wheezes  Abdomen: soft, nd, nt  Musculoskeletal: no cyanosis or clubbing   Data Reviewed: Basic Metabolic Panel:  Recent Labs Lab 01/26/15 2128 01/27/15 0556 01/28/15 0542  NA 137 138 139  K 4.4 4.0 4.2  CL 101 105 107  CO2 25 25 24   GLUCOSE 184* 122* 126*  BUN 13 14 12   CREATININE 1.41* 1.32* 1.36*  CALCIUM 9.8 8.8* 9.1   Liver Function Tests:  Recent Labs Lab 01/26/15 2128  AST 24  ALT  15*  ALKPHOS 89  BILITOT 0.8  PROT 7.5  ALBUMIN 3.6   No results for input(s): LIPASE, AMYLASE in the last 168 hours. No results for input(s): AMMONIA in the last 168 hours. CBC:  Recent Labs Lab 01/26/15 2128 01/27/15 0556 01/28/15 0542  WBC 14.6* 12.6* 12.3*  NEUTROABS 6.3  --   --   HGB 12.8* 10.6* 10.6*  HCT 38.3* 32.3* 32.5*  MCV 84.7 85.7 85.5  PLT 298 248 263   Cardiac Enzymes: No results for input(s): CKTOTAL, CKMB, CKMBINDEX, TROPONINI in the last 168 hours. BNP (last 3 results) No results for input(s): BNP in the last 8760 hours.  ProBNP (last 3 results) No results for input(s): PROBNP in the last 8760 hours.  CBG:  Recent Labs Lab 01/28/15 1620 01/28/15 2119 01/29/15 0626 01/29/15 1115 01/29/15 1612  GLUCAP 128* 107* 127* 126* 91    Recent Results (from the past 240 hour(s))  Culture, blood (routine x 2)     Status: None   Collection Time: 01/26/15  9:28 PM  Result Value Ref Range Status   Specimen Description BLOOD LEFT ANTECUBITAL  Final   Special Requests BOTTLES DRAWN AEROBIC AND ANAEROBIC 5CC   Final   Culture  Setup Time   Final    GRAM POSITIVE COCCI IN CLUSTERS BOTTLES DRAWN AEROBIC ONLY CRITICAL RESULT CALLED TO, READ BACK BY AND VERIFIED  WITH: STASCHA @0012  01/28/15 MKELLY    Culture   Final    STAPHYLOCOCCUS SPECIES (COAGULASE NEGATIVE) THE SIGNIFICANCE OF ISOLATING THIS ORGANISM FROM A SINGLE SET OF BLOOD CULTURES WHEN MULTIPLE SETS ARE DRAWN IS UNCERTAIN. PLEASE NOTIFY THE MICROBIOLOGY DEPARTMENT WITHIN ONE WEEK IF SPECIATION AND SENSITIVITIES ARE REQUIRED.    Report Status 01/29/2015 FINAL  Final  Culture, blood (routine x 2)     Status: None (Preliminary result)   Collection Time: 01/26/15  9:39 PM  Result Value Ref Range Status   Specimen Description BLOOD LEFT HAND  Final   Special Requests BOTTLES DRAWN AEROBIC AND ANAEROBIC 5CC   Final   Culture NO GROWTH 3 DAYS  Final   Report Status PENDING  Incomplete  Urine culture      Status: None   Collection Time: 01/26/15 10:17 PM  Result Value Ref Range Status   Specimen Description URINE, CLEAN CATCH  Final   Special Requests NONE  Final   Culture MULTIPLE SPECIES PRESENT, SUGGEST RECOLLECTION  Final   Report Status 01/28/2015 FINAL  Final  Culture, blood (routine x 2)     Status: None (Preliminary result)   Collection Time: 01/28/15  4:23 PM  Result Value Ref Range Status   Specimen Description BLOOD LEFT HAND  Final   Special Requests BOTTLES DRAWN AEROBIC AND ANAEROBIC 5CC  Final   Culture NO GROWTH < 24 HOURS  Final   Report Status PENDING  Incomplete     Studies: Mr Lumbar Spine W Wo Contrast  01/27/2015   CLINICAL DATA:  Back surgery in February 2016 for adjacent segment disease at L1-L2. Subsequent re-exploration 07/23/2014 for seroma. Now with severe back pain and fever. Elevated white count. History of diabetes mellitus.  EXAM: MRI LUMBAR SPINE WITHOUT AND WITH CONTRAST  TECHNIQUE: Multiplanar and multiecho pulse sequences of the lumbar spine were obtained without and with intravenous contrast.  CONTRAST:  6mL MULTIHANCE GADOBENATE DIMEGLUMINE 529 MG/ML IV SOLN  COMPARISON:  CT myelogram from 05/13/2014 is correlated. Also prior MRI from 01/01/2014. CT lumbar spine without contrast 01/27/2015.  FINDINGS: Segmentation: 5 lumbar vertebrae.  Alignment:  Trace anterolisthesis L3-4.  Vertebrae: Recent (2/16) surgery for L1 through L3 decompression, with posterior and interbody fusion using interbody cages and pedicle screw and rod construct. Abnormal vertebral body edema and enhancement above and below the L1-2 interbody cage. In conjunction with the CT appearance of widening of the interspace and endplate erosion, concern for L1-2 diskitis and regional osteomyelitis.  Conus medullaris: Normal in size, signal, and location.  Paraspinal tissues: No evidence for hydronephrosis or paravertebral mass. There is is edema of the RIGHT greater than LEFT psoas muscle, adjacent  to the L1-2 interspace, although no abscess is observed.Well-healed posterior midline incision.  Disc levels:  L1-L2: Interbody cages float in the interspace, surrounded by T2 hyperintense and enhancing material consistent with diskitis. No cage migration, but shallow ventral epidural mass effect is present, not resulting in significant stenosis. Regional edema of the L1 and L2 vertebrae. Unclear if L2 nerve root displacement is present. BILATERAL foraminal narrowing could affect either L1 nerve root.  L2-L3: Developing arthrodesis. No concerning features. No impingement.  L3-L4: Prior XLIF. No definite solid arthrodesis, but no concerning features for infection. Wide posterior decompression. No residual impingement.  L4-L5: Advanced disc space narrowing. Central disc osteophyte complex. Adequate posterior decompression. No impingement.  L5-S1: Central disc osteophyte complex. Facet arthropathy. No impingement.  Compared with the preoperative MR, the stenosis at L1-2 is improved.  IMPRESSION: When correlated with the recent CT, features are concerning for L1-2 diskitis and osteomyelitis. If there is no organism identified from blood cultures, L1-2 disc space aspiration could be performed.  Shallow ventral soft tissue abnormality at L1-2, without significant stenosis or concern for frank epidural abscess. Regional RIGHT psoas edema is observed.   Electronically Signed   By: Staci Righter M.D.   On: 01/27/2015 19:24   Ct Image Guided Drainage By Percutaneous Catheter  01/29/2015   CLINICAL DATA:  81 year old with back pain and concern for diskitis at L1-L2.  EXAM: CT GUIDED ASPIRATION OF L1-L2 DISC SPACE  ANESTHESIA/SEDATION: 1 mg Versed, 50 mcg fentanyl. A radiology nurse monitored the patient for moderate sedation.  PROCEDURE: Informed consent was obtained for a CT-guided aspiration. The patient was placed prone. CT images through the lumbar spine were obtained. The right flank was prepped with Betadine and a  sterile field was created. [place] needle was directed to the lateral aspect of the L1-L2 disc space. Approximately 1 cc of bloody fluid was aspirated after multiple needle manipulations. Specimens sent for culture. Bandage placed over the puncture site.  COMPLICATIONS: No immediate complication  FINDINGS: Needle directed into the right lateral aspect of the L1-L2 disc space. Small amount of bloody fluid was aspirated.  Estimated blood loss: Minimal  IMPRESSION: CT-guided aspiration of L1-L2 disc space.   Electronically Signed   By: Markus Daft M.D.   On: 01/29/2015 16:33    Scheduled Meds: . allopurinol  300 mg Oral Daily  . fentaNYL      . [START ON 01/30/2015] heparin  5,000 Units Subcutaneous 3 times per day  . insulin aspart  0-9 Units Subcutaneous TID WC  . latanoprost  1 drop Both Eyes QHS  . midazolam      . rifampin  300 mg Oral Daily  . sodium chloride  3 mL Intravenous Q12H  . tamsulosin  0.4 mg Oral Daily  . vancomycin  1,250 mg Intravenous Q24H  . vitamin B-12  1,000 mcg Oral Daily   Continuous Infusions: . sodium chloride 75 mL/hr at 01/28/15 0537     Time spent: > 35 minutes    Velvet Bathe  [facility] Pager [phone]. If 7PM-7AM, please contact night-coverage at [email or web address], password [facility] 01/29/2015, 6:40 PM  LOS: 2 days

## 2015-01-29 NOTE — Progress Notes (Signed)
Patient ID: Kevin Rogers, male   DOB: 13-Jun-1931, 81 y.o.   MRN: [id number] Patient well known to me. He had a 2 level back fusion about 8 months ago. We took him back to the OR about 3 weeks post op for a seroma. He was treated with IV antibiotics at that time thru a PICC line and did well from that point forward. Recently he has developed incresing back pain. He was admitted recently by the medical service for increasing pain. He underwent MRI that showed apparent discitis and probable osteo at L1 without epidural extension. He was started on IV antibiotic, and is now for needle aspiration to see if an organism can be identified.  He says that he is feeling slightly better than upon admission. He is intact neurologically. His wound looks good with no redness or drainage. At this time, there is no indication for surgery. He will need many weeks of IV antibiotics and we will see if his fusion takes. If not, he may need extension but not at any time soon. Will follow for now.

## 2015-01-29 NOTE — Procedures (Signed)
CT guided aspiration of L1-L2 disc space. Small amount of bloody fluid aspirated.  Minimal blood loss.  No immediate complication.

## 2015-01-30 LAB — GLUCOSE, CAPILLARY
Glucose-Capillary: 120 mg/dL — ABNORMAL HIGH (ref 65–99)
Glucose-Capillary: 116 mg/dL — ABNORMAL HIGH (ref 65–99)
Glucose-Capillary: 128 mg/dL — ABNORMAL HIGH (ref 65–99)
Glucose-Capillary: 120 mg/dL — ABNORMAL HIGH (ref 65–99)

## 2015-01-30 LAB — BASIC METABOLIC PANEL
Anion gap: 7 (ref 5–15)
BUN: 10 mg/dL (ref 6–20)
CO2: 25 mmol/L (ref 22–32)
Calcium: 9.2 mg/dL (ref 8.9–10.3)
Chloride: 107 mmol/L (ref 101–111)
Creatinine, Ser: 1.31 mg/dL — ABNORMAL HIGH (ref 0.61–1.24)
GFR calc Af Amer: 58 mL/min — ABNORMAL LOW (ref >60)
GFR calc non Af Amer: 50 mL/min — ABNORMAL LOW (ref >60)
Glucose, Bld: 120 mg/dL — ABNORMAL HIGH (ref 65–99)
Potassium: 4 mmol/L (ref 3.5–5.1)
Sodium: 139 mmol/L (ref 135–145)

## 2015-01-30 MED ORDER — FENTANYL CITRATE (PF) 100 MCG/2ML IJ SOLN
25.0000 ug | INTRAMUSCULAR | Status: DC | PRN
  Administered 2015-01-30 – 2015-01-31 (×2): 25 ug via INTRAVENOUS
  Filled 2015-01-30 (×2): qty 2

## 2015-01-30 NOTE — Progress Notes (Signed)
[facility] PROGRESS NOTE  Kevin Rogers UVO:[id number] DOB: 04/16/1932 DOA: 01/26/2015 PCP: Garret Reddish, MD  Assessment/Plan: Principal Problem:   Lumbar discitis - Awaiting for input from Dr. Hal Neer - ID consulted. Pt is currently on rifampin and Vancomycin - antibiotic regimen per ID recommendations - MRI suspicious for discitis or osteomyelitis - awaiting culture results currently reporting no growth  Active Problems:   Chronic lymphocytic leukemia   Diabetes mellitus type II, controlled - Pt on carb modified diet - continue SSI   B12 deficiency   Gout - stable continue allopurinol   Diabetic polyneuropathy   Glaucoma   Essential hypertension   BPH (benign prostatic hyperplasia) - stable patient is on flomax.   Lumbar spinal stenosis   CKD (chronic kidney disease), stage III   Hyperlipidemia - stable no chest pain reported.  Code Status: full Family Communication: no family at bedside Disposition Plan: Pending further recommendations from specialist involved   Consultants:  Infectious Disease  Dr. Hal Neer   Procedures:  None  Antibiotics:  Vancomycin  Rifampin  Zosyn  HPI/Subjective: Pt has no new complaints.  Objective: Filed Vitals:   01/30/15 0450  BP: 109/58  Pulse: 81  Temp: 98.5 F (36.9 C)  Resp: 18    Intake/Output Summary (Last 24 hours) at 01/30/15 1357 Last data filed at 01/30/15 1100  Gross per 24 hour  Intake    600 ml  Output   1650 ml  Net  -1050 ml   Filed Weights   01/26/15 2017 01/26/15 2245  Weight: 108.41 kg (239 lb) 105.49 kg (232 lb 9 oz)    Exam:   General:  Pt in nad, alert and awake  Cardiovascular: rrr, no mrg  Respiratory: cta bl, no wheezes  Abdomen: soft, nd, nt  Musculoskeletal: no cyanosis or clubbing   Data Reviewed: Basic Metabolic Panel:  Recent Labs Lab 01/26/15 2128 01/27/15 0556 01/28/15 0542 01/30/15 0550  NA 137 138 139 139  K 4.4 4.0 4.2 4.0  CL 101 105 107 107   CO2 25 25 24 25   GLUCOSE 184* 122* 126* 120*  BUN 13 14 12 10   CREATININE 1.41* 1.32* 1.36* 1.31*  CALCIUM 9.8 8.8* 9.1 9.2   Liver Function Tests:  Recent Labs Lab 01/26/15 2128  AST 24  ALT 15*  ALKPHOS 89  BILITOT 0.8  PROT 7.5  ALBUMIN 3.6   No results for input(s): LIPASE, AMYLASE in the last 168 hours. No results for input(s): AMMONIA in the last 168 hours. CBC:  Recent Labs Lab 01/26/15 2128 01/27/15 0556 01/28/15 0542  WBC 14.6* 12.6* 12.3*  NEUTROABS 6.3  --   --   HGB 12.8* 10.6* 10.6*  HCT 38.3* 32.3* 32.5*  MCV 84.7 85.7 85.5  PLT 298 248 263   Cardiac Enzymes: No results for input(s): CKTOTAL, CKMB, CKMBINDEX, TROPONINI in the last 168 hours. BNP (last 3 results) No results for input(s): BNP in the last 8760 hours.  ProBNP (last 3 results) No results for input(s): PROBNP in the last 8760 hours.  CBG:  Recent Labs Lab 01/29/15 1115 01/29/15 1612 01/29/15 2037 01/30/15 0600 01/30/15 1127  GLUCAP 126* 91 162* 120* 116*    Recent Results (from the past 240 hour(s))  Culture, blood (routine x 2)     Status: None   Collection Time: 01/26/15  9:28 PM  Result Value Ref Range Status   Specimen Description BLOOD LEFT ANTECUBITAL  Final   Special Requests BOTTLES DRAWN AEROBIC AND ANAEROBIC 5CC  Final   Culture  Setup Time   Final    GRAM POSITIVE COCCI IN CLUSTERS BOTTLES DRAWN AEROBIC ONLY CRITICAL RESULT CALLED TO, READ BACK BY AND VERIFIED WITH: STASCHA @0012  01/28/15 MKELLY    Culture   Final    STAPHYLOCOCCUS SPECIES (COAGULASE NEGATIVE) THE SIGNIFICANCE OF ISOLATING THIS ORGANISM FROM A SINGLE SET OF BLOOD CULTURES WHEN MULTIPLE SETS ARE DRAWN IS UNCERTAIN. PLEASE NOTIFY THE MICROBIOLOGY DEPARTMENT WITHIN ONE WEEK IF SPECIATION AND SENSITIVITIES ARE REQUIRED.    Report Status 01/29/2015 FINAL  Final  Culture, blood (routine x 2)     Status: None (Preliminary result)   Collection Time: 01/26/15  9:39 PM  Result Value Ref Range Status    Specimen Description BLOOD LEFT HAND  Final   Special Requests BOTTLES DRAWN AEROBIC AND ANAEROBIC 5CC   Final   Culture NO GROWTH 4 DAYS  Final   Report Status PENDING  Incomplete  Urine culture     Status: None   Collection Time: 01/26/15 10:17 PM  Result Value Ref Range Status   Specimen Description URINE, CLEAN CATCH  Final   Special Requests NONE  Final   Culture MULTIPLE SPECIES PRESENT, SUGGEST RECOLLECTION  Final   Report Status 01/28/2015 FINAL  Final  Culture, blood (routine x 2)     Status: None (Preliminary result)   Collection Time: 01/28/15  4:23 PM  Result Value Ref Range Status   Specimen Description BLOOD LEFT HAND  Final   Special Requests BOTTLES DRAWN AEROBIC AND ANAEROBIC 5CC  Final   Culture NO GROWTH 2 DAYS  Final   Report Status PENDING  Incomplete  Wound culture     Status: None (Preliminary result)   Collection Time: 01/29/15  4:30 PM  Result Value Ref Range Status   Specimen Description WOUND BACK  Final   Special Requests ASPIRATION AT L1 L2  Final   Gram Stain   Final    RAM WBC PRESENT,BOTH PMN AND MONONUCLEAR NO SQUAMOUS EPITHELIAL CELLS SEEN NO ORGANISMS SEEN Performed at [facility]    Culture NO GROWTH Performed at [facility]   Final   Report Status PENDING  Incomplete     Studies: Ct Image Guided Drainage By Percutaneous Catheter  01/29/2015   CLINICAL DATA:  81 year old with back pain and concern for diskitis at L1-L2.  EXAM: CT GUIDED ASPIRATION OF L1-L2 DISC SPACE  ANESTHESIA/SEDATION: 1 mg Versed, 50 mcg fentanyl. A radiology nurse monitored the patient for moderate sedation.  PROCEDURE: Informed consent was obtained for a CT-guided aspiration. The patient was placed prone. CT images through the lumbar spine were obtained. The right flank was prepped with Betadine and a sterile field was created. [place] needle was directed to the lateral aspect of the L1-L2 disc space. Approximately 1 cc of bloody fluid was  aspirated after multiple needle manipulations. Specimens sent for culture. Bandage placed over the puncture site.  COMPLICATIONS: No immediate complication  FINDINGS: Needle directed into the right lateral aspect of the L1-L2 disc space. Small amount of bloody fluid was aspirated.  Estimated blood loss: Minimal  IMPRESSION: CT-guided aspiration of L1-L2 disc space.   Electronically Signed   By: Markus Daft M.D.   On: 01/29/2015 16:33    Scheduled Meds: . allopurinol  300 mg Oral Daily  . heparin  5,000 Units Subcutaneous 3 times per day  . insulin aspart  0-9 Units Subcutaneous TID WC  . latanoprost  1 drop Both Eyes QHS  .  rifampin  300 mg Oral Daily  . sodium chloride  3 mL Intravenous Q12H  . tamsulosin  0.4 mg Oral Daily  . vancomycin  1,250 mg Intravenous Q24H  . vitamin B-12  1,000 mcg Oral Daily   Continuous Infusions: . sodium chloride 75 mL/hr at 01/30/15 1130     Time spent: > 35 minutes    Velvet Bathe  [facility] Pager [phone]. If 7PM-7AM, please contact night-coverage at [email or web address], password [facility] 01/30/2015, 1:57 PM  LOS: 3 days

## 2015-01-30 NOTE — Progress Notes (Signed)
Utilization review completed.  

## 2015-01-30 NOTE — Progress Notes (Signed)
Occupational Therapy Treatment Patient Details Name: Kevin Rogers MRN: [id number] DOB: 1931-11-21 Today's Date: 01/30/2015    History of present illness Pt is an 81 y/o male with a PMH of lumbar spinal stenosis s/p lumbar laminectomy, lumbar fusion, lumbar wound debridement on 07/23/14, HTN, DM, gout, leukemia, BPH. Pt presents with worsening fever and back pain. CT scan was done which shows evidence for discitis. There is loosening of the L1 and L3 pedicle screws. All these findings are concerning for infection of hardware.   OT comments  Pt demonstrates improving activity tolerance.  Requires cues for back precautions.  He requires mod - max A for LB ADLs and min A for functional mobility.  He would benefit from CIR to allow him to return home mod I - intermittent supervision level.   Follow Up Recommendations  Supervision/Assistance - 24 hour;CIR    Equipment Recommendations  None recommended by OT (Pt has 3:1 and will get friend to purchase tub/shower seat)    Recommendations for Other Services      Precautions / Restrictions Precautions Precautions: Fall;Back Precaution Booklet Issued: No Precaution Comments: Pt able to recall 1/3 back precautions.  Reviewed precautions  Restrictions Weight Bearing Restrictions: No       Mobility Bed Mobility               General bed mobility comments: up in recliner   Transfers Overall transfer level: Needs assistance Equipment used: Rolling walker (2 wheeled) Transfers: Sit to/from [facility] Sit to Stand: Min assist Stand pivot transfers: Min assist       General transfer comment: min A to power up into standing.  Min A for balance     Balance Overall balance assessment: Needs assistance Sitting-balance support: Feet supported Sitting balance-Leahy Scale: Fair     Standing balance support: During functional activity;Single extremity supported Standing balance-Leahy Scale: Poor Standing balance  comment: requires UE support                    ADL Overall ADL's : Needs assistance/impaired     Grooming: Wash/dry hands;Minimal assistance;Standing       Lower Body Bathing: Sit to/from stand;Moderate assistance       Lower Body Dressing: Maximal assistance;Sit to/from stand Lower Body Dressing Details (indicate cue type and reason): Pt still unable to cross ankles over knees due to pain.  He reports he was able to do this PTA  Toilet Transfer: Minimal assistance;Ambulation;Comfort height toilet;BSC;RW;Grab bars   Toileting- Clothing Manipulation and Hygiene: Sit to/from stand;Minimal assistance       Functional mobility during ADLs: Minimal assistance;Rolling walker General ADL Comments: reviewed back precautions and safety with ADLs       Vision                     Perception     Praxis      Cognition   Behavior During Therapy: WFL for tasks assessed/performed Overall Cognitive Status: Within Functional Limits for tasks assessed                       Extremity/Trunk Assessment               Exercises     Shoulder Instructions       General Comments      Pertinent Vitals/ Pain       Pain Assessment: 0-10 Pain Score: 7  Pain Location: back  Pain Descriptors / Indicators: Aching;Constant;Grimacing  Pain Intervention(s): Monitored during session  Home Living                                          Prior Functioning/Environment              Frequency Min 2X/week     Progress Toward Goals  OT Goals(current goals can now be found in the care plan section)  Progress towards OT goals: Progressing toward goals  Acute Rehab OT Goals Patient Stated Goal: Return home ADL Goals Pt Will Perform Grooming: with modified independence;standing Pt Will Perform Lower Body Bathing: with supervision;sit to/from stand;with adaptive equipment Pt Will Perform Lower Body Dressing: with supervision;sit to/from  stand;with adaptive equipment Pt Will Transfer to Toilet: with modified independence;bedside commode;ambulating Pt Will Perform Toileting - Clothing Manipulation and hygiene: with modified independence;sit to/from stand Pt Will Perform Tub/Shower Transfer: Shower transfer;shower seat;rolling walker;ambulating;with supervision  Plan Discharge plan needs to be updated    Co-evaluation                 End of Session Equipment Utilized During Treatment: Rolling walker   Activity Tolerance Patient tolerated treatment well   Patient Left in chair;with call bell/phone within reach;with family/visitor present   Nurse Communication Mobility status        Time: 2440-1027 OT Time Calculation (min): 36 min  Charges: OT General Charges $OT Visit: 1 Procedure OT Treatments $Self Care/Home Management : 8-22 mins $Therapeutic Activity: 8-22 mins  Lynnette Pote M 01/30/2015, 5:10 PM

## 2015-01-30 NOTE — Progress Notes (Signed)
Patient ID: Kevin Rogers, male   DOB: 19-Jul-1931, 81 y.o.   MRN: [id number] o new issues. Had aspiration yesterday, results pending. Needs antibiotics for now. I am away for next few days. Dr Annette Stable will be aware of patients situation. Contact if needed.

## 2015-01-30 NOTE — Progress Notes (Signed)
ANTIBIOTIC CONSULT NOTE - FOLLOW UP  Pharmacy Consult for Vancomycin Indication: discitis and osteomyelitis with hardware  No Known Allergies  Patient Measurements: Height: 6' (182.9 cm) Weight: 232 lb 9 oz (105.49 kg) IBW/kg (Calculated) : 77.6  Vital Signs: Temp: 98.5 F (36.9 C) (09/30 0450) Temp Source: Oral (09/30 0450) BP: 109/58 mmHg (09/30 0450) Pulse Rate: 81 (09/30 0450) Intake/Output from previous day: 09/29 0701 - 09/30 0700 In: 600 [P.O.:600] Out: 950 [Urine:950] Intake/Output from this shift: Total I/O In: 240 [P.O.:240] Out: 700 [Urine:700]  Labs:  Recent Labs  01/28/15 0542 01/30/15 0550  WBC 12.3*  --   HGB 10.6*  --   PLT 263  --   CREATININE 1.36* 1.31*   Estimated Creatinine Clearance: 56.5 mL/min (by C-G formula based on Cr of 1.31). No results for input(s): VANCOTROUGH, VANCOPEAK, VANCORANDOM, GENTTROUGH, GENTPEAK, GENTRANDOM, TOBRATROUGH, TOBRAPEAK, TOBRARND, AMIKACINPEAK, AMIKACINTROU, AMIKACIN in the last 72 hours.   Microbiology: Recent Results (from the past 720 hour(s))  Culture, blood (routine x 2)     Status: None   Collection Time: 01/26/15  9:28 PM  Result Value Ref Range Status   Specimen Description BLOOD LEFT ANTECUBITAL  Final   Special Requests BOTTLES DRAWN AEROBIC AND ANAEROBIC 5CC   Final   Culture  Setup Time   Final    GRAM POSITIVE COCCI IN CLUSTERS BOTTLES DRAWN AEROBIC ONLY CRITICAL RESULT CALLED TO, READ BACK BY AND VERIFIED WITH: STASCHA @0012  01/28/15 MKELLY    Culture   Final    STAPHYLOCOCCUS SPECIES (COAGULASE NEGATIVE) THE SIGNIFICANCE OF ISOLATING THIS ORGANISM FROM A SINGLE SET OF BLOOD CULTURES WHEN MULTIPLE SETS ARE DRAWN IS UNCERTAIN. PLEASE NOTIFY THE MICROBIOLOGY DEPARTMENT WITHIN ONE WEEK IF SPECIATION AND SENSITIVITIES ARE REQUIRED.    Report Status 01/29/2015 FINAL  Final  Culture, blood (routine x 2)     Status: None (Preliminary result)   Collection Time: 01/26/15  9:39 PM  Result Value Ref  Range Status   Specimen Description BLOOD LEFT HAND  Final   Special Requests BOTTLES DRAWN AEROBIC AND ANAEROBIC 5CC   Final   Culture NO GROWTH 3 DAYS  Final   Report Status PENDING  Incomplete  Urine culture     Status: None   Collection Time: 01/26/15 10:17 PM  Result Value Ref Range Status   Specimen Description URINE, CLEAN CATCH  Final   Special Requests NONE  Final   Culture MULTIPLE SPECIES PRESENT, SUGGEST RECOLLECTION  Final   Report Status 01/28/2015 FINAL  Final  Culture, blood (routine x 2)     Status: None (Preliminary result)   Collection Time: 01/28/15  4:23 PM  Result Value Ref Range Status   Specimen Description BLOOD LEFT HAND  Final   Special Requests BOTTLES DRAWN AEROBIC AND ANAEROBIC 5CC  Final   Culture NO GROWTH < 24 HOURS  Final   Report Status PENDING  Incomplete  Wound culture     Status: None (Preliminary result)   Collection Time: 01/29/15  4:30 PM  Result Value Ref Range Status   Specimen Description WOUND BACK  Final   Special Requests ASPIRATION AT L1 L2  Final   Gram Stain   Final    RAM WBC PRESENT,BOTH PMN AND MONONUCLEAR NO SQUAMOUS EPITHELIAL CELLS SEEN NO ORGANISMS SEEN Performed at [facility]    Culture NO GROWTH Performed at [facility]   Final   Report Status PENDING  Incomplete    Anti-infectives    Start  Dose/Rate Route Frequency Ordered Stop   01/28/15 1530  rifampin (RIFADIN) capsule 300 mg     300 mg Oral Daily 01/28/15 1502     01/27/15 1200  vancomycin (VANCOCIN) 1,250 mg in sodium chloride 0.9 % 250 mL IVPB     1,250 mg 166.7 mL/hr over 90 Minutes Intravenous Every 24 hours 01/27/15 0210     01/27/15 0600  piperacillin-tazobactam (ZOSYN) IVPB 3.375 g  Status:  Discontinued     3.375 g 12.5 mL/hr over 240 Minutes Intravenous Every 8 hours 01/27/15 0210 01/29/15 1004   01/27/15 0145  piperacillin-tazobactam (ZOSYN) IVPB 3.375 g  Status:  Discontinued     3.375 g 100 mL/hr over 30 Minutes  Intravenous 3 times per day 01/27/15 0138 01/27/15 0206   01/27/15 0015  vancomycin (VANCOCIN) IVPB 1000 mg/200 mL premix     1,000 mg 200 mL/hr over 60 Minutes Intravenous  Once 01/27/15 0005 01/27/15 0159   01/27/15 0015  piperacillin-tazobactam (ZOSYN) IVPB 3.375 g     3.375 g 100 mL/hr over 30 Minutes Intravenous  Once 01/27/15 0005 01/27/15 0150      Assessment: 81 year old male with discitis and osteomyelitis on Day #4 of antibiotic therapy, currently Vancomycin and rifampin.  His creatinine is elevated but stable.  Note one of his blood cultures grew Coagulase-negative Staph which likely indicates contamination.  His disc aspirate cultures are still pending.  Goal of Therapy:  Vancomycin trough level 15-20 mcg/ml  Plan:  Continue Vancomycin 1250mg  IV q24h Check vancomycin trough and BMET before dose on 10/1. Follow available micro data  Legrand Como, Pharm.D., BCPS, AAHIVP Clinical Pharmacist Phone: [phone] or [phone] 01/30/2015, 11:55 AM

## 2015-01-30 NOTE — Progress Notes (Signed)
Physical Therapy Treatment Patient Details Name: Kevin Rogers MRN: [id number] DOB: May 03, 1931 Today's Date: 01/30/2015    History of Present Illness Pt is an 81 y/o male with a PMH of lumbar spinal stenosis s/p lumbar laminectomy, lumbar fusion, lumbar wound debridement on 07/23/14, HTN, DM, gout, leukemia, BPH. Pt presents with worsening fever and back pain. CT scan was done which shows evidence for discitis. There is loosening of the L1 and L3 pedicle screws. All these findings are concerning for infection of hardware.    PT Comments    Patient more limited with mobility today due to pain, decreased balance, and decreased knowledge of use of DME. Pt lives alone and in current condition is unable to care for himself. If pain control improves, will be able to increase his independence and perhaps return home without further inpatient therapies.   Follow Up Recommendations  CIR     Equipment Recommendations  None recommended by PT    Recommendations for Other Services Rehab consult     Precautions / Restrictions Precautions Precautions: Fall;Back Precaution Booklet Issued: No Precaution Comments: Reviewed back precautions for comfort. Pt is familiar from previous surgeries. Encouraged log roll and sitting upright with feet on floor to limit strain on low back.  Restrictions Weight Bearing Restrictions: No    Mobility  Bed Mobility                  Transfers Overall transfer level: Needs assistance Equipment used: Rolling walker (2 wheeled) Transfers: Sit to/from Stand Sit to Stand: Mod assist         General transfer comment: from recliner pt able to push up without assist until he attempted to transition his hands to RW from armrests; pt was too far posterior with Rt hand missing grip and major loss of balance; educated when standing with nursing to have them anchor RW and for him to use one hand on chair and one hand on RW  Ambulation/Gait Ambulation/Gait  assistance: Min assist;+2 safety/equipment Ambulation Distance (Feet): 32 Feet Assistive device: Rolling walker (2 wheeled) Gait Pattern/deviations: Step-through pattern;Decreased stride length;Trunk flexed Gait velocity: Decreased   General Gait Details: VC's for improved posture, increased floor clearance, and walker placement closer to pt's body. Overall steady, however it appears that pt is labored in ambulating. 2nd person for IV/safety   Stairs            Wheelchair Mobility    Modified Rankin (Stroke Patients Only)       Balance     Sitting balance-Leahy Scale: Fair     Standing balance support: Bilateral upper extremity supported Standing balance-Leahy Scale: Poor Standing balance comment: cannot release RW with UEs                    Cognition Arousal/Alertness: Awake/alert Behavior During Therapy: WFL for tasks assessed/performed Overall Cognitive Status: Within Functional Limits for tasks assessed                      Exercises      General Comments        Pertinent Vitals/Pain Pain Assessment: 0-10 Pain Score: 9  Pain Location: back @ biopsy site Pain Intervention(s): Limited activity within patient's tolerance;Monitored during session;Premedicated before session;Repositioned;Ice applied    Home Living                      Prior Function  PT Goals (current goals can now be found in the care plan section) Acute Rehab PT Goals Patient Stated Goal: Return home Time For Goal Achievement: 02/04/15 Progress towards PT goals: Not progressing toward goals - comment (incr pain compared to 9/29)    Frequency  Min 3X/week    PT Plan Discharge plan needs to be updated    Co-evaluation             End of Session Equipment Utilized During Treatment: Gait belt Activity Tolerance: Patient limited by pain Patient left: in chair;with call bell/phone within reach;with family/visitor present     Time:  4081-4481 PT Time Calculation (min) (ACUTE ONLY): 16 min  Charges:  $Gait Training: 8-22 mins                    G Codes:      Kevin Rogers 2015/02/20, 4:43 PM Pager [phone]

## 2015-01-30 NOTE — Care Management Important Message (Signed)
Important Message  Patient Details  Name: Kevin Rogers MRN: [id number] Date of Birth: 1932-02-09   Medicare Important Message Given:  Yes-second notification given    Nathen May 01/30/2015, 12:36 PM

## 2015-01-31 LAB — BASIC METABOLIC PANEL
Anion gap: 8 (ref 5–15)
BUN: 10 mg/dL (ref 6–20)
CO2: 24 mmol/L (ref 22–32)
Calcium: 9.3 mg/dL (ref 8.9–10.3)
Chloride: 103 mmol/L (ref 101–111)
Creatinine, Ser: 1.14 mg/dL (ref 0.61–1.24)
GFR calc Af Amer: >60 mL/min (ref >60)
GFR calc non Af Amer: 59 mL/min — ABNORMAL LOW (ref >60)
Glucose, Bld: 128 mg/dL — ABNORMAL HIGH (ref 65–99)
Potassium: 4.1 mmol/L (ref 3.5–5.1)
Sodium: 135 mmol/L (ref 135–145)

## 2015-01-31 LAB — GLUCOSE, CAPILLARY
Glucose-Capillary: 106 mg/dL — ABNORMAL HIGH (ref 65–99)
Glucose-Capillary: 111 mg/dL — ABNORMAL HIGH (ref 65–99)
Glucose-Capillary: 121 mg/dL — ABNORMAL HIGH (ref 65–99)
Glucose-Capillary: 109 mg/dL — ABNORMAL HIGH (ref 65–99)

## 2015-01-31 LAB — CULTURE, BLOOD (ROUTINE X 2): Culture: NO GROWTH

## 2015-01-31 LAB — VANCOMYCIN, TROUGH: Vancomycin Tr: 11 ug/mL (ref 10.0–20.0)

## 2015-01-31 MED ORDER — FENTANYL 25 MCG/HR TD PT72
25.0000 ug | MEDICATED_PATCH | TRANSDERMAL | Status: DC
  Administered 2015-01-31: 25 ug via TRANSDERMAL
  Filled 2015-01-31: qty 1

## 2015-01-31 MED ORDER — VANCOMYCIN HCL IN DEXTROSE 1-5 GM/200ML-% IV SOLN
1000.0000 mg | Freq: Two times a day (BID) | INTRAVENOUS | Status: DC
  Administered 2015-01-31 – 2015-02-02 (×5): 1000 mg via INTRAVENOUS
  Filled 2015-01-31 (×7): qty 200

## 2015-01-31 NOTE — Progress Notes (Addendum)
ANTIBIOTIC CONSULT NOTE - FOLLOW UP  Pharmacy Consult for Vancomycin Indication: discitis and osteomyelitis with hardware  No Known Allergies  Patient Measurements: Height: 6' (182.9 cm) Weight: 232 lb 9 oz (105.49 kg) IBW/kg (Calculated) : 77.6  Vital Signs: Temp: 98.8 F (37.1 C) (10/01 0446) Temp Source: Oral (10/01 0446) BP: 125/72 mmHg (10/01 0446) Pulse Rate: 63 (10/01 0446) Intake/Output from previous day: 09/30 0701 - 10/01 0700 In: 720 [P.O.:720] Out: 2301 [Urine:2300; Stool:1] Intake/Output from this shift:    Labs:  Recent Labs  01/30/15 0550 01/31/15 1102  CREATININE 1.31* 1.14   Estimated Creatinine Clearance: 64.9 mL/min (by C-G formula based on Cr of 1.14).  Recent Labs  01/31/15 1102  [place] 11     Microbiology: Recent Results (from the past 720 hour(s))  Culture, blood (routine x 2)     Status: None   Collection Time: 01/26/15  9:28 PM  Result Value Ref Range Status   Specimen Description BLOOD LEFT ANTECUBITAL  Final   Special Requests BOTTLES DRAWN AEROBIC AND ANAEROBIC 5CC   Final   Culture  Setup Time   Final    GRAM POSITIVE COCCI IN CLUSTERS BOTTLES DRAWN AEROBIC ONLY CRITICAL RESULT CALLED TO, READ BACK BY AND VERIFIED WITH: STASCHA @0012  01/28/15 MKELLY    Culture   Final    STAPHYLOCOCCUS SPECIES (COAGULASE NEGATIVE) THE SIGNIFICANCE OF ISOLATING THIS ORGANISM FROM A SINGLE SET OF BLOOD CULTURES WHEN MULTIPLE SETS ARE DRAWN IS UNCERTAIN. PLEASE NOTIFY THE MICROBIOLOGY DEPARTMENT WITHIN ONE WEEK IF SPECIATION AND SENSITIVITIES ARE REQUIRED.    Report Status 01/29/2015 FINAL  Final  Culture, blood (routine x 2)     Status: None (Preliminary result)   Collection Time: 01/26/15  9:39 PM  Result Value Ref Range Status   Specimen Description BLOOD LEFT HAND  Final   Special Requests BOTTLES DRAWN AEROBIC AND ANAEROBIC 5CC   Final   Culture NO GROWTH 4 DAYS  Final   Report Status PENDING  Incomplete  Urine culture     Status:  None   Collection Time: 01/26/15 10:17 PM  Result Value Ref Range Status   Specimen Description URINE, CLEAN CATCH  Final   Special Requests NONE  Final   Culture MULTIPLE SPECIES PRESENT, SUGGEST RECOLLECTION  Final   Report Status 01/28/2015 FINAL  Final  Culture, blood (routine x 2)     Status: None (Preliminary result)   Collection Time: 01/28/15  4:23 PM  Result Value Ref Range Status   Specimen Description BLOOD LEFT HAND  Final   Special Requests BOTTLES DRAWN AEROBIC AND ANAEROBIC 5CC  Final   Culture NO GROWTH 2 DAYS  Final   Report Status PENDING  Incomplete  Wound culture     Status: None (Preliminary result)   Collection Time: 01/29/15  4:30 PM  Result Value Ref Range Status   Specimen Description WOUND BACK  Final   Special Requests ASPIRATION AT L1 L2  Final   Gram Stain   Final    RAM WBC PRESENT,BOTH PMN AND MONONUCLEAR NO SQUAMOUS EPITHELIAL CELLS SEEN NO ORGANISMS SEEN Performed at [facility]    Culture   Final    NO GROWTH 1 DAY Performed at [facility]    Report Status PENDING  Incomplete    Anti-infectives    Start     Dose/Rate Route Frequency Ordered Stop   01/28/15 1530  rifampin (RIFADIN) capsule 300 mg     300 mg Oral Daily 01/28/15 1502  01/27/15 1200  vancomycin (VANCOCIN) 1,250 mg in sodium chloride 0.9 % 250 mL IVPB     1,250 mg 166.7 mL/hr over 90 Minutes Intravenous Every 24 hours 01/27/15 0210     01/27/15 0600  piperacillin-tazobactam (ZOSYN) IVPB 3.375 g  Status:  Discontinued     3.375 g 12.5 mL/hr over 240 Minutes Intravenous Every 8 hours 01/27/15 0210 01/29/15 1004   01/27/15 0145  piperacillin-tazobactam (ZOSYN) IVPB 3.375 g  Status:  Discontinued     3.375 g 100 mL/hr over 30 Minutes Intravenous 3 times per day 01/27/15 0138 01/27/15 0206   01/27/15 0015  vancomycin (VANCOCIN) IVPB 1000 mg/200 mL premix     1,000 mg 200 mL/hr over 60 Minutes Intravenous  Once 01/27/15 0005 01/27/15 0159   01/27/15 0015   piperacillin-tazobactam (ZOSYN) IVPB 3.375 g     3.375 g 100 mL/hr over 30 Minutes Intravenous  Once 01/27/15 0005 01/27/15 0150      Assessment: 81 year old male with discitis and osteomyelitis on Day #5 of antibiotic therapy, currently Vancomycin and rifampin. One of his blood cultures grew Coagulase-negative Staph which likely indicates contamination. His disc aspirate cultures are still pending as are repeat blood cultures.  SCr improved to 1.14 this morning. A vancomycin trough drawn this morning was below goal at 71mcg/mL on 1250mg  IV q24h.   Goal of Therapy:  Vancomycin trough level 15-20 mcg/ml  Plan:  -increase vancomycin to 1g IV q12h for estimated trough of ~40mcg/mL -continue rifampin 300mg  PO daily as per MD -follow c/s, surgical plans, LOT, renal function  Ivelise Castillo D. Kwame Ryland, PharmD, BCPS Clinical Pharmacist Pager: [phone] 01/31/2015 12:19 PM

## 2015-01-31 NOTE — Progress Notes (Signed)
[facility] PROGRESS NOTE  Kevin Rogers BWG:[id number] DOB: 04/22/1932 DOA: 01/26/2015 PCP: Garret Reddish, MD  Assessment/Plan: Principal Problem:   Lumbar discitis - No surgery found by Dr. Hal Neer - ID consulted. Pt is currently on rifampin and Vancomycin - antibiotic regimen per ID recommendations - MRI suspicious for discitis or osteomyelitis - awaiting culture results - Changing to fentanyl patches may have to uptitrate dose pending pain levels.  Active Problems:   Chronic lymphocytic leukemia   Diabetes mellitus type II, controlled - Pt on carb modified diet - continue SSI   B12 deficiency   Gout - stable continue allopurinol   Diabetic polyneuropathy   Glaucoma   Essential hypertension   BPH (benign prostatic hyperplasia) - stable patient is on flomax.   Lumbar spinal stenosis   CKD (chronic kidney disease), stage III   Hyperlipidemia - stable no chest pain reported.  Code Status: full Family Communication: no family at bedside Disposition Plan: Pending further recommendations from specialist involved   Consultants:  Infectious Disease  Dr. Hal Neer   Procedures:  None  Antibiotics:  Vancomycin  Rifampin  Zosyn  HPI/Subjective: Pt has no new complaints. No acute issues reported overnight.  Objective: Filed Vitals:   01/31/15 1500  BP: 132/79  Pulse: 73  Temp: 98.3 F (36.8 C)  Resp: 18    Intake/Output Summary (Last 24 hours) at 01/31/15 1525 Last data filed at 01/31/15 1330  Gross per 24 hour  Intake    840 ml  Output   2403 ml  Net  -1563 ml   Filed Weights   01/26/15 2017 01/26/15 2245  Weight: 108.41 kg (239 lb) 105.49 kg (232 lb 9 oz)    Exam:   General:  Pt in nad, alert and awake  Cardiovascular: rrr, no mrg  Respiratory: cta bl, no wheezes  Abdomen: soft, nd, nt  Musculoskeletal: no cyanosis or clubbing   Data Reviewed: Basic Metabolic Panel:  Recent Labs Lab 01/26/15 2128 01/27/15 0556  01/28/15 0542 01/30/15 0550 01/31/15 1102  NA 137 138 139 139 135  K 4.4 4.0 4.2 4.0 4.1  CL 101 105 107 107 103  CO2 25 25 24 25 24   GLUCOSE 184* 122* 126* 120* 128*  BUN 13 14 12 10 10   CREATININE 1.41* 1.32* 1.36* 1.31* 1.14  CALCIUM 9.8 8.8* 9.1 9.2 9.3   Liver Function Tests:  Recent Labs Lab 01/26/15 2128  AST 24  ALT 15*  ALKPHOS 89  BILITOT 0.8  PROT 7.5  ALBUMIN 3.6   No results for input(s): LIPASE, AMYLASE in the last 168 hours. No results for input(s): AMMONIA in the last 168 hours. CBC:  Recent Labs Lab 01/26/15 2128 01/27/15 0556 01/28/15 0542  WBC 14.6* 12.6* 12.3*  NEUTROABS 6.3  --   --   HGB 12.8* 10.6* 10.6*  HCT 38.3* 32.3* 32.5*  MCV 84.7 85.7 85.5  PLT 298 248 263   Cardiac Enzymes: No results for input(s): CKTOTAL, CKMB, CKMBINDEX, TROPONINI in the last 168 hours. BNP (last 3 results) No results for input(s): BNP in the last 8760 hours.  ProBNP (last 3 results) No results for input(s): PROBNP in the last 8760 hours.  CBG:  Recent Labs Lab 01/30/15 1127 01/30/15 1612 01/30/15 2118 01/31/15 0627 01/31/15 1147  GLUCAP 116* 128* 120* 106* 111*    Recent Results (from the past 240 hour(s))  Culture, blood (routine x 2)     Status: None   Collection Time: 01/26/15  9:28 PM  Result  Value Ref Range Status   Specimen Description BLOOD LEFT ANTECUBITAL  Final   Special Requests BOTTLES DRAWN AEROBIC AND ANAEROBIC 5CC   Final   Culture  Setup Time   Final    GRAM POSITIVE COCCI IN CLUSTERS BOTTLES DRAWN AEROBIC ONLY CRITICAL RESULT CALLED TO, READ BACK BY AND VERIFIED WITH: STASCHA @0012  01/28/15 MKELLY    Culture   Final    STAPHYLOCOCCUS SPECIES (COAGULASE NEGATIVE) THE SIGNIFICANCE OF ISOLATING THIS ORGANISM FROM A SINGLE SET OF BLOOD CULTURES WHEN MULTIPLE SETS ARE DRAWN IS UNCERTAIN. PLEASE NOTIFY THE MICROBIOLOGY DEPARTMENT WITHIN ONE WEEK IF SPECIATION AND SENSITIVITIES ARE REQUIRED.    Report Status 01/29/2015 FINAL   Final  Culture, blood (routine x 2)     Status: None   Collection Time: 01/26/15  9:39 PM  Result Value Ref Range Status   Specimen Description BLOOD LEFT HAND  Final   Special Requests BOTTLES DRAWN AEROBIC AND ANAEROBIC 5CC   Final   Culture NO GROWTH 5 DAYS  Final   Report Status 01/31/2015 FINAL  Final  Urine culture     Status: None   Collection Time: 01/26/15 10:17 PM  Result Value Ref Range Status   Specimen Description URINE, CLEAN CATCH  Final   Special Requests NONE  Final   Culture MULTIPLE SPECIES PRESENT, SUGGEST RECOLLECTION  Final   Report Status 01/28/2015 FINAL  Final  Culture, blood (routine x 2)     Status: None (Preliminary result)   Collection Time: 01/28/15  4:23 PM  Result Value Ref Range Status   Specimen Description BLOOD LEFT HAND  Final   Special Requests BOTTLES DRAWN AEROBIC AND ANAEROBIC 5CC  Final   Culture NO GROWTH 3 DAYS  Final   Report Status PENDING  Incomplete  Wound culture     Status: None (Preliminary result)   Collection Time: 01/29/15  4:30 PM  Result Value Ref Range Status   Specimen Description WOUND BACK  Final   Special Requests ASPIRATION AT L1 L2  Final   Gram Stain   Final    RAM WBC PRESENT,BOTH PMN AND MONONUCLEAR NO SQUAMOUS EPITHELIAL CELLS SEEN NO ORGANISMS SEEN Performed at [facility]    Culture   Final    NO GROWTH 1 DAY Performed at [facility]    Report Status PENDING  Incomplete     Studies: No results found.  Scheduled Meds: . allopurinol  300 mg Oral Daily  . fentaNYL  25 mcg Transdermal Q72H  . heparin  5,000 Units Subcutaneous 3 times per day  . insulin aspart  0-9 Units Subcutaneous TID WC  . latanoprost  1 drop Both Eyes QHS  . rifampin  300 mg Oral Daily  . sodium chloride  3 mL Intravenous Q12H  . tamsulosin  0.4 mg Oral Daily  . vancomycin  1,000 mg Intravenous Q12H  . vitamin B-12  1,000 mcg Oral Daily   Continuous Infusions:     Time spent: > 35 minutes    Velvet Bathe  [facility] Pager [phone]. If 7PM-7AM, please contact night-coverage at [email or web address], password [email or web address] 01/31/2015, 3:25 PM  LOS: 4 days

## 2015-02-01 LAB — GLUCOSE, CAPILLARY
Glucose-Capillary: 111 mg/dL — ABNORMAL HIGH (ref 65–99)
Glucose-Capillary: 138 mg/dL — ABNORMAL HIGH (ref 65–99)
Glucose-Capillary: 139 mg/dL — ABNORMAL HIGH (ref 65–99)
Glucose-Capillary: 112 mg/dL — ABNORMAL HIGH (ref 65–99)

## 2015-02-01 LAB — WOUND CULTURE: Culture: NO GROWTH

## 2015-02-01 MED ORDER — FENTANYL 12 MCG/HR TD PT72: 37.5000 ug | MEDICATED_PATCH | TRANSDERMAL | Status: DC

## 2015-02-01 NOTE — Consult Note (Signed)
       [facility] for Infectious Disease    I followed up patients cultures per Dr. Johnnye Sima.   His lumbar wound culture was without growth  I would continue the Vancomycin and rifampin x 6 weeks post aspiration  HE MUST HAVE A PHARMACIST DOSING THE VANCOMYCIN AT SNF VS [place].  Diagnosis: DISKITIS  Culture Result: LUMBAR SPACE NO GROWTH  No Known Allergies  Discharge antibiotics: VANCOMYCIN AND RIFAMPIN Per pharmacy protocol VANCOMYCIN Duration: 6 WEEKS BUT NEEDS TO BE RE-EVALUATED BY [place] IN ID CLINIC PRIOR TO STOPPING IV ANTIBIOTICS  End Date: November 10TH BUT PT MUST BE SEEN BY [place] PRIOR TO [place] ABX   PIC Care Per Protocol: Labs weekly while on IV antibiotics: _x_ CBC with differential x__ CMP x__ CRP x__ ESR _x_ Vancomycin trough  Fax weekly labs to [phone]  Clinic Follow Up Appt:  NEXT 3 WEEKS WE WILL ARRANGE

## 2015-02-01 NOTE — Progress Notes (Signed)
[facility] PROGRESS NOTE  Kevin Rogers EVO:[id number] DOB: [birth year] DOA: 01/26/2015 PCP: Garret Reddish, MD  Assessment/Plan: Principal Problem:   Lumbar discitis - No surgery found by Dr. Hal Neer - ID consulted. Pt is currently on rifampin and Vancomycin - antibiotic regimen per ID recommendations - MRI suspicious for discitis or osteomyelitis - awaiting culture results - Fentanyl patch helped but patient's pain level still elevated he would like to have dose increased for improvement in pain control. Will increase dose to 37.5  Active Problems:   Chronic lymphocytic leukemia   Diabetes mellitus type II, controlled - Pt on carb modified diet - continue SSI   B12 deficiency   Gout - stable continue allopurinol   Diabetic polyneuropathy   Glaucoma   Essential hypertension   BPH (benign prostatic hyperplasia) - stable patient is on flomax.   Lumbar spinal stenosis   CKD (chronic kidney disease), stage III   Hyperlipidemia - stable no chest pain reported.  Code Status: full Family Communication: no family at bedside Disposition Plan: Pending further recommendations from specialist involved   Consultants:  Infectious Disease  Dr. Hal Neer   Procedures:  None  Antibiotics:  Vancomycin  Rifampin  Zosyn  HPI/Subjective: Pt has no new complaints. No acute issues reported overnight.  Objective: Filed Vitals:   02/01/15 1413  BP: 125/68  Pulse: 65  Temp: 98 F (36.7 C)  Resp: 16    Intake/Output Summary (Last 24 hours) at 02/01/15 1905 Last data filed at 02/01/15 1712  Gross per 24 hour  Intake    840 ml  Output   1175 ml  Net   -335 ml   Filed Weights   01/26/15 2017 01/26/15 2245  Weight: 108.41 kg (239 lb) 105.49 kg (232 lb 9 oz)    Exam:   General:  Pt in nad, alert and awake  Cardiovascular: rrr, no mrg  Respiratory: cta bl, no wheezes  Abdomen: soft, nd, nt  Musculoskeletal: no cyanosis or clubbing   Data  Reviewed: Basic Metabolic Panel:  Recent Labs Lab 01/26/15 2128 01/27/15 0556 01/28/15 0542 01/30/15 0550 01/31/15 1102  NA 137 138 139 139 135  K 4.4 4.0 4.2 4.0 4.1  CL 101 105 107 107 103  CO2 25 25 24 25 24   GLUCOSE 184* 122* 126* 120* 128*  BUN 13 14 12 10 10   CREATININE 1.41* 1.32* 1.36* 1.31* 1.14  CALCIUM 9.8 8.8* 9.1 9.2 9.3   Liver Function Tests:  Recent Labs Lab 01/26/15 2128  AST 24  ALT 15*  ALKPHOS 89  BILITOT 0.8  PROT 7.5  ALBUMIN 3.6   No results for input(s): LIPASE, AMYLASE in the last 168 hours. No results for input(s): AMMONIA in the last 168 hours. CBC:  Recent Labs Lab 01/26/15 2128 01/27/15 0556 01/28/15 0542  WBC 14.6* 12.6* 12.3*  NEUTROABS 6.3  --   --   HGB 12.8* 10.6* 10.6*  HCT 38.3* 32.3* 32.5*  MCV 84.7 85.7 85.5  PLT 298 248 263   Cardiac Enzymes: No results for input(s): CKTOTAL, CKMB, CKMBINDEX, TROPONINI in the last 168 hours. BNP (last 3 results) No results for input(s): BNP in the last 8760 hours.  ProBNP (last 3 results) No results for input(s): PROBNP in the last 8760 hours.  CBG:  Recent Labs Lab 01/31/15 1633 01/31/15 2127 02/01/15 0551 02/01/15 1107 02/01/15 1607  GLUCAP 121* 109* 111* 138* 139*    Recent Results (from the past 240 hour(s))  Culture, blood (routine x 2)  Status: None   Collection Time: 01/26/15  9:28 PM  Result Value Ref Range Status   Specimen Description BLOOD LEFT ANTECUBITAL  Final   Special Requests BOTTLES DRAWN AEROBIC AND ANAEROBIC 5CC   Final   Culture  Setup Time   Final    GRAM POSITIVE COCCI IN CLUSTERS BOTTLES DRAWN AEROBIC ONLY CRITICAL RESULT CALLED TO, READ BACK BY AND VERIFIED WITH: STASCHA @0012  01/28/15 MKELLY    Culture   Final    STAPHYLOCOCCUS SPECIES (COAGULASE NEGATIVE) THE SIGNIFICANCE OF ISOLATING THIS ORGANISM FROM A SINGLE SET OF BLOOD CULTURES WHEN MULTIPLE SETS ARE DRAWN IS UNCERTAIN. PLEASE NOTIFY THE MICROBIOLOGY DEPARTMENT WITHIN ONE WEEK IF  SPECIATION AND SENSITIVITIES ARE REQUIRED.    Report Status 01/29/2015 FINAL  Final  Culture, blood (routine x 2)     Status: None   Collection Time: 01/26/15  9:39 PM  Result Value Ref Range Status   Specimen Description BLOOD LEFT HAND  Final   Special Requests BOTTLES DRAWN AEROBIC AND ANAEROBIC 5CC   Final   Culture NO GROWTH 5 DAYS  Final   Report Status 01/31/2015 FINAL  Final  Urine culture     Status: None   Collection Time: 01/26/15 10:17 PM  Result Value Ref Range Status   Specimen Description URINE, CLEAN CATCH  Final   Special Requests NONE  Final   Culture MULTIPLE SPECIES PRESENT, SUGGEST RECOLLECTION  Final   Report Status 01/28/2015 FINAL  Final  Culture, blood (routine x 2)     Status: None (Preliminary result)   Collection Time: 01/28/15  4:23 PM  Result Value Ref Range Status   Specimen Description BLOOD LEFT HAND  Final   Special Requests BOTTLES DRAWN AEROBIC AND ANAEROBIC 5CC  Final   Culture NO GROWTH 4 DAYS  Final   Report Status PENDING  Incomplete  Wound culture     Status: None   Collection Time: 01/29/15  4:30 PM  Result Value Ref Range Status   Specimen Description WOUND BACK  Final   Special Requests ASPIRATION AT L1 L2  Final   Gram Stain   Final    RAM WBC PRESENT,BOTH PMN AND MONONUCLEAR NO SQUAMOUS EPITHELIAL CELLS SEEN NO ORGANISMS SEEN Performed at [facility]    Culture   Final    NO GROWTH 2 DAYS Performed at [facility]    Report Status 02/01/2015 FINAL  Final     Studies: No results found.  Scheduled Meds: . allopurinol  300 mg Oral Daily  . fentaNYL  25 mcg Transdermal Q72H  . heparin  5,000 Units Subcutaneous 3 times per day  . insulin aspart  0-9 Units Subcutaneous TID WC  . latanoprost  1 drop Both Eyes QHS  . rifampin  300 mg Oral Daily  . sodium chloride  3 mL Intravenous Q12H  . tamsulosin  0.4 mg Oral Daily  . vancomycin  1,000 mg Intravenous Q12H  . vitamin B-12  1,000 mcg Oral Daily    Continuous Infusions:    Time spent: > 35 minutes  Velvet Bathe  [facility] Pager [phone]. If 7PM-7AM, please contact night-coverage at [email or web address], password [email or web address] 02/01/2015, 7:05 PM  LOS: 5 days

## 2015-02-02 LAB — GLUCOSE, CAPILLARY
Glucose-Capillary: 118 mg/dL — ABNORMAL HIGH (ref 65–99)
Glucose-Capillary: 116 mg/dL — ABNORMAL HIGH (ref 65–99)
Glucose-Capillary: 117 mg/dL — ABNORMAL HIGH (ref 65–99)
Glucose-Capillary: 117 mg/dL — ABNORMAL HIGH (ref 65–99)

## 2015-02-02 LAB — BASIC METABOLIC PANEL
Anion gap: 10 (ref 5–15)
BUN: 12 mg/dL (ref 6–20)
CO2: 26 mmol/L (ref 22–32)
Calcium: 9.6 mg/dL (ref 8.9–10.3)
Chloride: 102 mmol/L (ref 101–111)
Creatinine, Ser: 1.08 mg/dL (ref 0.61–1.24)
GFR calc Af Amer: >60 mL/min (ref >60)
GFR calc non Af Amer: >60 mL/min (ref >60)
Glucose, Bld: 119 mg/dL — ABNORMAL HIGH (ref 65–99)
Potassium: 4.1 mmol/L (ref 3.5–5.1)
Sodium: 138 mmol/L (ref 135–145)

## 2015-02-02 LAB — CBC
HCT: 31.9 % — ABNORMAL LOW (ref 39.0–52.0)
Hemoglobin: 10.2 g/dL — ABNORMAL LOW (ref 13.0–17.0)
MCH: 27.3 pg (ref 26.0–34.0)
MCHC: 32 g/dL (ref 30.0–36.0)
MCV: 85.3 fL (ref 78.0–100.0)
Platelets: 315 10*3/uL (ref 150–400)
RBC: 3.74 MIL/uL — ABNORMAL LOW (ref 4.22–5.81)
RDW: 14.9 % (ref 11.5–15.5)
WBC: 11.8 10*3/uL — ABNORMAL HIGH (ref 4.0–10.5)

## 2015-02-02 LAB — CULTURE, BLOOD (ROUTINE X 2): Culture: NO GROWTH

## 2015-02-02 MED ORDER — FENTANYL 25 MCG/HR TD PT72
50.0000 ug | MEDICATED_PATCH | TRANSDERMAL | Status: DC
  Administered 2015-02-03: 50 ug via TRANSDERMAL
  Filled 2015-02-02: qty 2

## 2015-02-02 NOTE — Progress Notes (Signed)
Utilization review completed.  

## 2015-02-02 NOTE — Progress Notes (Addendum)
[facility] PROGRESS NOTE  Kevin Rogers ZHY:[id number] DOB: [birth year] DOA: 01/26/2015 PCP: Garret Reddish, MD  Assessment/Plan: Principal Problem:   Lumbar discitis - No surgery found by Dr. Hal Neer - ID consulted. Pt is currently on rifampin and Vancomycin and will need to continue this for 6 weeks - MRI suspicious for discitis or osteomyelitis - culture results reporting no growth - Fentanyl patch helped but patient's pain level still elevated will increase fentanyl dose to 50 mcg  Active Problems:   Chronic lymphocytic leukemia   Diabetes mellitus type II, controlled - Pt on carb modified diet - continue SSI   B12 deficiency   Gout - stable continue allopurinol   Diabetic polyneuropathy   Glaucoma   Essential hypertension   BPH (benign prostatic hyperplasia) - stable patient is on flomax.   Lumbar spinal stenosis   CKD (chronic kidney disease), stage III   Hyperlipidemia - stable no chest pain reported.  Code Status: full Family Communication: no family at bedside Disposition Plan: addendum: discharge planning for SNF, discussed with social worker and patient.   Consultants:  Infectious Disease  Dr. Hal Neer   Procedures:  None  Antibiotics:  Vancomycin  Rifampin  Zosyn  HPI/Subjective: Pt still complaining of back discomfort. The fentanyl patch is helpful but he states that the pain is till elevated especially when he is active.  Objective: Filed Vitals:   02/02/15 1314  BP: 121/57  Pulse: 74  Temp: 98.2 F (36.8 C)  Resp: 18    Intake/Output Summary (Last 24 hours) at 02/02/15 1621 Last data filed at 02/02/15 1300  Gross per 24 hour  Intake    850 ml  Output   2400 ml  Net  -1550 ml   Filed Weights   01/26/15 2017 01/26/15 2245  Weight: 108.41 kg (239 lb) 105.49 kg (232 lb 9 oz)    Exam:   General:  Pt in nad, alert and awake  Cardiovascular: rrr, no mrg  Respiratory: cta bl, no wheezes  Abdomen: soft, nd,  nt  Musculoskeletal: no cyanosis or clubbing   Data Reviewed: Basic Metabolic Panel:  Recent Labs Lab 01/27/15 0556 01/28/15 0542 01/30/15 0550 01/31/15 1102 02/02/15 0525  NA 138 139 139 135 138  K 4.0 4.2 4.0 4.1 4.1  CL 105 107 107 103 102  CO2 25 24 25 24 26   GLUCOSE 122* 126* 120* 128* 119*  BUN 14 12 10 10 12   CREATININE 1.32* 1.36* 1.31* 1.14 1.08  CALCIUM 8.8* 9.1 9.2 9.3 9.6   Liver Function Tests:  Recent Labs Lab 01/26/15 2128  AST 24  ALT 15*  ALKPHOS 89  BILITOT 0.8  PROT 7.5  ALBUMIN 3.6   No results for input(s): LIPASE, AMYLASE in the last 168 hours. No results for input(s): AMMONIA in the last 168 hours. CBC:  Recent Labs Lab 01/26/15 2128 01/27/15 0556 01/28/15 0542 02/02/15 0525  WBC 14.6* 12.6* 12.3* 11.8*  NEUTROABS 6.3  --   --   --   HGB 12.8* 10.6* 10.6* 10.2*  HCT 38.3* 32.3* 32.5* 31.9*  MCV 84.7 85.7 85.5 85.3  PLT 298 248 263 315   Cardiac Enzymes: No results for input(s): CKTOTAL, CKMB, CKMBINDEX, TROPONINI in the last 168 hours. BNP (last 3 results) No results for input(s): BNP in the last 8760 hours.  ProBNP (last 3 results) No results for input(s): PROBNP in the last 8760 hours.  CBG:  Recent Labs Lab 02/01/15 1107 02/01/15 1607 02/01/15 2116 02/02/15 2952 02/02/15 1141  GLUCAP 138* 139* 112* 118* 116*    Recent Results (from the past 240 hour(s))  Culture, blood (routine x 2)     Status: None   Collection Time: 01/26/15  9:28 PM  Result Value Ref Range Status   Specimen Description BLOOD LEFT ANTECUBITAL  Final   Special Requests BOTTLES DRAWN AEROBIC AND ANAEROBIC 5CC   Final   Culture  Setup Time   Final    GRAM POSITIVE COCCI IN CLUSTERS BOTTLES DRAWN AEROBIC ONLY CRITICAL RESULT CALLED TO, READ BACK BY AND VERIFIED WITH: STASCHA @0012  01/28/15 MKELLY    Culture   Final    STAPHYLOCOCCUS SPECIES (COAGULASE NEGATIVE) THE SIGNIFICANCE OF ISOLATING THIS ORGANISM FROM A SINGLE SET OF BLOOD CULTURES  WHEN MULTIPLE SETS ARE DRAWN IS UNCERTAIN. PLEASE NOTIFY THE MICROBIOLOGY DEPARTMENT WITHIN ONE WEEK IF SPECIATION AND SENSITIVITIES ARE REQUIRED.    Report Status 01/29/2015 FINAL  Final  Culture, blood (routine x 2)     Status: None   Collection Time: 01/26/15  9:39 PM  Result Value Ref Range Status   Specimen Description BLOOD LEFT HAND  Final   Special Requests BOTTLES DRAWN AEROBIC AND ANAEROBIC 5CC   Final   Culture NO GROWTH 5 DAYS  Final   Report Status 01/31/2015 FINAL  Final  Urine culture     Status: None   Collection Time: 01/26/15 10:17 PM  Result Value Ref Range Status   Specimen Description URINE, CLEAN CATCH  Final   Special Requests NONE  Final   Culture MULTIPLE SPECIES PRESENT, SUGGEST RECOLLECTION  Final   Report Status 01/28/2015 FINAL  Final  Culture, blood (routine x 2)     Status: None   Collection Time: 01/28/15  4:23 PM  Result Value Ref Range Status   Specimen Description BLOOD LEFT HAND  Final   Special Requests BOTTLES DRAWN AEROBIC AND ANAEROBIC 5CC  Final   Culture NO GROWTH 5 DAYS  Final   Report Status 02/02/2015 FINAL  Final  Wound culture     Status: None   Collection Time: 01/29/15  4:30 PM  Result Value Ref Range Status   Specimen Description WOUND BACK  Final   Special Requests ASPIRATION AT L1 L2  Final   Gram Stain   Final    RAM WBC PRESENT,BOTH PMN AND MONONUCLEAR NO SQUAMOUS EPITHELIAL CELLS SEEN NO ORGANISMS SEEN Performed at [facility]    Culture   Final    NO GROWTH 2 DAYS Performed at [facility]    Report Status 02/01/2015 FINAL  Final     Studies: No results found.  Scheduled Meds: . allopurinol  300 mg Oral Daily  . [START ON 02/03/2015] fentaNYL  37.5 mcg Transdermal Q72H  . heparin  5,000 Units Subcutaneous 3 times per day  . insulin aspart  0-9 Units Subcutaneous TID WC  . latanoprost  1 drop Both Eyes QHS  . rifampin  300 mg Oral Daily  . sodium chloride  3 mL Intravenous Q12H  . tamsulosin   0.4 mg Oral Daily  . vancomycin  1,000 mg Intravenous Q12H  . vitamin B-12  1,000 mcg Oral Daily   Continuous Infusions:    Time spent: > 35 minutes  Velvet Bathe  [facility] Pager [phone]. If 7PM-7AM, please contact night-coverage at [email or web address], password [facility] 02/02/2015, 4:21 PM  LOS: 6 days

## 2015-02-02 NOTE — Progress Notes (Signed)
Physical Therapy Treatment Patient Details Name: Kevin Rogers MRN: [id number] DOB: 10-06-1931 Today's Date: 02/02/2015    History of Present Illness Pt is an 81 y/o male with a PMH of lumbar spinal stenosis s/p lumbar laminectomy, lumbar fusion, lumbar wound debridement on 07/23/14, HTN, DM, gout, leukemia, BPH. Pt presents with worsening fever and back pain. CT scan was done which shows evidence for discitis. There is loosening of the L1 and L3 pedicle screws. All these findings are concerning for infection of hardware.    PT Comments    Patient reports limited activity over the weekend (walking to bathroom with nursing). Reports sitting increases his pain and he cannot tolerate. Motivated and pushes himself.   Follow Up Recommendations  SNF (pt reports he feels he needs more help than friend can provi)     Equipment Recommendations  None recommended by PT    Recommendations for Other Services       Precautions / Restrictions Precautions Precautions: Fall;Back Precaution Booklet Issued: No Precaution Comments: Reviewed back precautions for comfort. Pt is familiar from previous surgeries. Encouraged log roll and sitting upright with feet on floor to limit strain on low back.  Restrictions Weight Bearing Restrictions: No    Mobility  Bed Mobility Overal bed mobility: Needs Assistance Bed Mobility: Rolling;Sidelying to Sit;Sit to Supine Rolling: Modified independent (Device/Increase time) Sidelying to sit: Min assist;HOB elevated   Sit to supine: Min assist   General bed mobility comments: pt able to verbalize how he should get up, however did NOT want the [facility] lowered (reports too painful); +use of rail; prefers sit to supine instead of sidelying (despite explanation)  Transfers Overall transfer level: Needs assistance Equipment used: Rolling walker (2 wheeled) Transfers: Sit to/from Stand Sit to Stand: Min guard         General transfer comment: from bed elevated to  simulate his bed at home; vc for safety  Ambulation/Gait Ambulation/Gait assistance: Min assist Ambulation Distance (Feet): 250 Feet Assistive device: Rolling walker (2 wheeled) Gait Pattern/deviations: Step-through pattern;Shuffle;Decreased stride length;Trunk flexed Gait velocity: Decreased   General Gait Details: VC's for improved posture, increased floor clearance, and walker placement closer to pt's body. Overall steady, however it appears that pt is labored in ambulating. Pt can stand erect and close to RW for only 5 seconds at a time (reports pain increases)   Stairs            Wheelchair Mobility    Modified Rankin (Stroke Patients Only)       Balance     Sitting balance-Leahy Scale: Fair     Standing balance support: Bilateral upper extremity supported Standing balance-Leahy Scale: Poor                      Cognition Arousal/Alertness: Awake/alert Behavior During Therapy: WFL for tasks assessed/performed Overall Cognitive Status: Within Functional Limits for tasks assessed                      Exercises      General Comments        Pertinent Vitals/Pain Pain Assessment: 0-10 Pain Score: 10-Worst pain ever Pain Location: back Pain Descriptors / Indicators: Grimacing;Guarding Pain Intervention(s): Limited activity within patient's tolerance;Monitored during session;Repositioned (pt wanted to relax before deciding if he needs meds)    Home Living                      Prior Function  PT Goals (current goals can now be found in the care plan section) Acute Rehab PT Goals Patient Stated Goal: Return home Time For Goal Achievement: 02/04/15 Progress towards PT goals: Progressing toward goals    Frequency  Min 3X/week    PT Plan Discharge plan needs to be updated    Co-evaluation             End of Session Equipment Utilized During Treatment: Gait belt Activity Tolerance: Patient limited by  pain Patient left: with call bell/phone within reach;in bed     Time: 1430-1452 PT Time Calculation (min) (ACUTE ONLY): 22 min  Charges:  $Gait Training: 8-22 mins                    G Codes:      Micaella Gitto 2015/02/09, 3:01 PM Pager [phone]

## 2015-02-02 NOTE — Care Management Important Message (Signed)
Important Message  Patient Details  Name: Kevin Rogers MRN: [id number] Date of Birth: [birth year]   Medicare Important Message Given:  Yes-third notification given    Delorse Lek 02/02/2015, 1:17 PM

## 2015-02-02 NOTE — Progress Notes (Signed)
Occupational Therapy Treatment Patient Details Name: Kevin Rogers MRN: [id number] DOB: March 19, 1932 Today's Date: 02/02/2015    History of present illness Pt is an 81 y.o. male with a PMH of lumbar spinal stenosis s/p lumbar laminectomy, lumbar fusion, lumbar wound debridement on 07/23/14, HTN, DM, gout, leukemia, BPH. Pt presents with worsening fever and back pain. CT scan was done which shows evidence for discitis. There is loosening of the L1 and L3 pedicle screws. All these findings are concerning for infection of hardware.   OT comments  Pt progressing. Education provided in session. Noted CIR's note in chart, so updated d/c recommendation.  Follow Up Recommendations  Home health OT;Supervision/Assistance - 24 hour    Equipment Recommendations   (pt has 3 in 1 and will get friend to purchase tub/shower chair; AE)    Recommendations for Other Services      Precautions / Restrictions Precautions Precautions: Fall;Back Precaution Booklet Issued: No Precaution Comments: reviewed precautions Restrictions Weight Bearing Restrictions: No       Mobility Bed Mobility Overal bed mobility: Needs Assistance Bed Mobility: Rolling;Sidelying to Sit;Sit to Sidelying Rolling: Min assist Sidelying to sit: Min guard     Sit to sidelying: Mod assist General bed mobility comments: cues for log roll technique.   Transfers Overall transfer level: Needs assistance Equipment used: Rolling walker (2 wheeled) Transfers: Sit to/from Stand Sit to Stand: Min guard              Balance  Pt used RW for ambulation. NO LOB in session.                                  ADL Overall ADL's : Needs assistance/impaired     Grooming: Wash/dry face;Oral care;Min guard;Standing (OT gave tactile cues to stand upright)               Lower Body Dressing: Minimal assistance;Sitting/lateral leans (donning/doffing shoes)   Toilet Transfer: Min guard;Ambulation;RW (sit to stand from  bed)           Functional mobility during ADLs: Min guard;Rolling walker General ADL Comments: Educated on AE. Discussed incorporating precautions into functional activities.       Vision                     Perception     Praxis      Cognition  Awake/Alert Behavior During Therapy: WFL for tasks assessed/performed Overall Cognitive Status: No family/caregiver present to determine baseline cognitive functioning (breaking back precautions in session-several cues given)                       Extremity/Trunk Assessment               Exercises     Shoulder Instructions       General Comments      Pertinent Vitals/ Pain       Pain Assessment: 0-10 Pain Score:  (9-10 when sitting EOB) Pain Location: back Pain Descriptors / Indicators: Grimacing Pain Intervention(s): Monitored during session;Limited activity within patient's tolerance;Repositioned  Home Living                                          Prior Functioning/Environment  Frequency Min 2X/week     Progress Toward Goals  OT Goals(current goals can now be found in the care plan section)  Progress towards OT goals: Progressing toward goals  Acute Rehab OT Goals Patient Stated Goal: not stated OT Goal Formulation: With patient Time For Goal Achievement: 02/11/15 Potential to Achieve Goals: Good ADL Goals Pt Will Perform Grooming: with modified independence;standing Pt Will Perform Lower Body Bathing: with supervision;sit to/from stand;with adaptive equipment Pt Will Perform Lower Body Dressing: with supervision;sit to/from stand;with adaptive equipment Pt Will Transfer to Toilet: with modified independence;bedside commode;ambulating Pt Will Perform Toileting - Clothing Manipulation and hygiene: with modified independence;sit to/from stand Pt Will Perform Tub/Shower Transfer: Shower transfer;shower seat;rolling walker;ambulating;with supervision   Plan Discharge plan needs to be updated    Co-evaluation                 End of Session Equipment Utilized During Treatment: Gait belt;Rolling walker   Activity Tolerance Patient limited by pain   Patient Left in bed;with call bell/phone within reach   Nurse Communication Patient requests pain meds        Time: 1012-1028 OT Time Calculation (min): 16 min  Charges: OT General Charges $OT Visit: 1 Procedure OT Treatments $Self Care/Home Management : 8-22 mins  Benito Mccreedy OTR/L [phone] 02/02/2015, 10:41 AM

## 2015-02-02 NOTE — Progress Notes (Signed)
Rehab Admissions Coordinator Note:  Patient was screened by Retta Diones for appropriateness for an Inpatient Acute Rehab Consult.  At this time, we are recommending [place] Patient has [facility] medicare and they are unlikely to approve acute inpatient rehab admission given current diagnosis.  Call me for questions.  Jodell Cipro M 02/02/2015, 8:24 AM  I can be reached at [phone].

## 2015-02-03 LAB — GLUCOSE, CAPILLARY
Glucose-Capillary: 127 mg/dL — ABNORMAL HIGH (ref 65–99)
Glucose-Capillary: 109 mg/dL — ABNORMAL HIGH (ref 65–99)
Glucose-Capillary: 131 mg/dL — ABNORMAL HIGH (ref 65–99)
Glucose-Capillary: 117 mg/dL — ABNORMAL HIGH (ref 65–99)

## 2015-02-03 LAB — VANCOMYCIN, TROUGH: Vancomycin Tr: 22 ug/mL — ABNORMAL HIGH (ref 10.0–20.0)

## 2015-02-03 MED ORDER — VANCOMYCIN HCL IN DEXTROSE 750-5 MG/150ML-% IV SOLN
750.0000 mg | Freq: Two times a day (BID) | INTRAVENOUS | Status: DC
  Administered 2015-02-03 – 2015-02-04 (×2): 750 mg via INTRAVENOUS
  Filled 2015-02-03 (×4): qty 150

## 2015-02-03 NOTE — Progress Notes (Signed)
[facility] PROGRESS NOTE  Kevin Rogers CBS:[id number] DOB: [birth year] DOA: 01/26/2015 PCP: Garret Reddish, MD  Brief Narrative: Patient is an 81 year old who presented with lumbar discitis/osteomyelitis. IR attempted to obtain biopsy for culture. No growth and as such ID has recommended 6 weeks of vancomycin and rifampin. Order placed for PICC line and currently awaiting acceptance into SNF after PICC line placement.  Assessment/Plan: Principal Problem:   Lumbar discitis - No surgery found by Dr. Hal Neer - ID consulted. Pt is currently on rifampin and Vancomycin and will need to continue this for 6 weeks - MRI suspicious for discitis or osteomyelitis - culture results reporting no growth - Fentanyl patch increased dose to 50 mcg. Reports improvement in pain control on this regimen. - place order for picc line placement  Active Problems:   Chronic lymphocytic leukemia   Diabetes mellitus type II, controlled - Pt on carb modified diet - continue SSI   B12 deficiency   Gout - stable continue allopurinol   Diabetic polyneuropathy   Glaucoma   Essential hypertension   BPH (benign prostatic hyperplasia) - stable patient is on flomax.   Lumbar spinal stenosis   CKD (chronic kidney disease), stage III   Hyperlipidemia - stable no chest pain reported.  Code Status: full Family Communication: no family at bedside Disposition Plan: discharge planning for SNF, discussed with social worker and patient.   Consultants:  Infectious Disease  Dr. Hal Neer   Procedures:  None  Antibiotics:  Vancomycin  Rifampin  HPI/Subjective: Pt would like to transition to SNF.  Objective: Filed Vitals:   02/03/15 1411  BP: 101/71  Pulse: 77  Temp: 98.5 F (36.9 C)  Resp: 18    Intake/Output Summary (Last 24 hours) at 02/03/15 1722 Last data filed at 02/03/15 1230  Gross per 24 hour  Intake    720 ml  Output   2000 ml  Net  -1280 ml   Filed Weights   01/26/15 2017  01/26/15 2245  Weight: 108.41 kg (239 lb) 105.49 kg (232 lb 9 oz)    Exam:   General:  Pt in nad, alert and awake  Cardiovascular: rrr, no mrg  Respiratory: cta bl, no wheezes  Abdomen: soft, nd, nt  Musculoskeletal: no cyanosis or clubbing   Data Reviewed: Basic Metabolic Panel:  Recent Labs Lab 01/28/15 0542 01/30/15 0550 01/31/15 1102 02/02/15 0525  NA 139 139 135 138  K 4.2 4.0 4.1 4.1  CL 107 107 103 102  CO2 24 25 24 26   GLUCOSE 126* 120* 128* 119*  BUN 12 10 10 12   CREATININE 1.36* 1.31* 1.14 1.08  CALCIUM 9.1 9.2 9.3 9.6   Liver Function Tests: No results for input(s): AST, ALT, ALKPHOS, BILITOT, PROT, ALBUMIN in the last 168 hours. No results for input(s): LIPASE, AMYLASE in the last 168 hours. No results for input(s): AMMONIA in the last 168 hours. CBC:  Recent Labs Lab 01/28/15 0542 02/02/15 0525  WBC 12.3* 11.8*  HGB 10.6* 10.2*  HCT 32.5* 31.9*  MCV 85.5 85.3  PLT 263 315   Cardiac Enzymes: No results for input(s): CKTOTAL, CKMB, CKMBINDEX, TROPONINI in the last 168 hours. BNP (last 3 results) No results for input(s): BNP in the last 8760 hours.  ProBNP (last 3 results) No results for input(s): PROBNP in the last 8760 hours.  CBG:  Recent Labs Lab 02/02/15 1628 02/02/15 2117 02/03/15 0622 02/03/15 1122 02/03/15 1619  GLUCAP 117* 117* 127* 109* 131*    Recent Results (from the  past 240 hour(s))  Culture, blood (routine x 2)     Status: None   Collection Time: 01/26/15  9:28 PM  Result Value Ref Range Status   Specimen Description BLOOD LEFT ANTECUBITAL  Final   Special Requests BOTTLES DRAWN AEROBIC AND ANAEROBIC 5CC   Final   Culture  Setup Time   Final    GRAM POSITIVE COCCI IN CLUSTERS BOTTLES DRAWN AEROBIC ONLY CRITICAL RESULT CALLED TO, READ BACK BY AND VERIFIED WITH: STASCHA @0012  01/28/15 MKELLY    Culture   Final    STAPHYLOCOCCUS SPECIES (COAGULASE NEGATIVE) THE SIGNIFICANCE OF ISOLATING THIS ORGANISM FROM A  SINGLE SET OF BLOOD CULTURES WHEN MULTIPLE SETS ARE DRAWN IS UNCERTAIN. PLEASE NOTIFY THE MICROBIOLOGY DEPARTMENT WITHIN ONE WEEK IF SPECIATION AND SENSITIVITIES ARE REQUIRED.    Report Status 01/29/2015 FINAL  Final  Culture, blood (routine x 2)     Status: None   Collection Time: 01/26/15  9:39 PM  Result Value Ref Range Status   Specimen Description BLOOD LEFT HAND  Final   Special Requests BOTTLES DRAWN AEROBIC AND ANAEROBIC 5CC   Final   Culture NO GROWTH 5 DAYS  Final   Report Status 01/31/2015 FINAL  Final  Urine culture     Status: None   Collection Time: 01/26/15 10:17 PM  Result Value Ref Range Status   Specimen Description URINE, CLEAN CATCH  Final   Special Requests NONE  Final   Culture MULTIPLE SPECIES PRESENT, SUGGEST RECOLLECTION  Final   Report Status 01/28/2015 FINAL  Final  Culture, blood (routine x 2)     Status: None   Collection Time: 01/28/15  4:23 PM  Result Value Ref Range Status   Specimen Description BLOOD LEFT HAND  Final   Special Requests BOTTLES DRAWN AEROBIC AND ANAEROBIC 5CC  Final   Culture NO GROWTH 5 DAYS  Final   Report Status 02/02/2015 FINAL  Final  Wound culture     Status: None   Collection Time: 01/29/15  4:30 PM  Result Value Ref Range Status   Specimen Description WOUND BACK  Final   Special Requests ASPIRATION AT L1 L2  Final   Gram Stain   Final    RAM WBC PRESENT,BOTH PMN AND MONONUCLEAR NO SQUAMOUS EPITHELIAL CELLS SEEN NO ORGANISMS SEEN Performed at [facility]    Culture   Final    NO GROWTH 2 DAYS Performed at [facility]    Report Status 02/01/2015 FINAL  Final     Studies: No results found.  Scheduled Meds: . allopurinol  300 mg Oral Daily  . fentaNYL  50 mcg Transdermal Q72H  . heparin  5,000 Units Subcutaneous 3 times per day  . insulin aspart  0-9 Units Subcutaneous TID WC  . latanoprost  1 drop Both Eyes QHS  . rifampin  300 mg Oral Daily  . sodium chloride  3 mL Intravenous Q12H  .  tamsulosin  0.4 mg Oral Daily  . vancomycin  750 mg Intravenous Q12H  . vitamin B-12  1,000 mcg Oral Daily   Continuous Infusions:    Time spent: > 35 minutes  Velvet Bathe  [facility] Pager [phone]. If 7PM-7AM, please contact night-coverage at [email or web address], password [facility] 02/03/2015, 5:22 PM  LOS: 7 days

## 2015-02-03 NOTE — Clinical Social Work Placement (Signed)
   CLINICAL SOCIAL WORK PLACEMENT  NOTE  Date:  02/03/2015  Patient Details  Name: Kevin Rogers MRN: [id number] Date of Birth: 1931/07/13  Clinical Social Work is seeking post-discharge placement for this patient at the [place] level of care (*CSW will initial, date and re-position this form in  chart as items are completed):  Yes   Patient/family provided with [facility] Work Department's list of facilities offering this level of care within the geographic area requested by the patient (or if unable, by the patient's family).  Yes   Patient/family informed of their freedom to choose among providers that offer the needed level of care, that participate in Medicare, Medicaid or managed care program needed by the patient, have an available bed and are willing to accept the patient.  Yes   Patient/family informed of [facility]'s ownership interest in [facility] and [facility], as well as of the fact that they are under no obligation to receive care at these facilities.  PASRR submitted to EDS on 02/03/15     PASRR number received on 02/03/15     Existing PASRR number confirmed on       FL2 transmitted to all facilities in geographic area requested by pt/family on 02/03/15     FL2 transmitted to all facilities within larger geographic area on       Patient informed that his/her managed care company has contracts with or will negotiate with certain facilities, including the following:            Patient/family informed of bed offers received.  Patient chooses bed at       Physician recommends and patient chooses bed at      Patient to be transferred to   on  .  Patient to be transferred to facility by       Patient family notified on   of transfer.  Name of family member notified:        PHYSICIAN Please sign FL2     Additional Comment:    _______________________________________________ Cranford Mon, LCSW 02/03/2015, 8:36  AM

## 2015-02-03 NOTE — Progress Notes (Addendum)
ANTIBIOTIC CONSULT NOTE - FOLLOW UP  Pharmacy Consult for Vancomycin Indication: discitis and osteomyelitis with hardware  No Known Allergies  Patient Measurements: Height: 6' (182.9 cm) Weight: 232 lb 9 oz (105.49 kg) IBW/kg (Calculated) : 77.6  Vital Signs: Temp: 97.9 F (36.6 C) (10/04 0501) Temp Source: Oral (10/04 0501) BP: 104/57 mmHg (10/04 0501) Pulse Rate: 70 (10/04 0501) Intake/Output from previous day: 10/03 0701 - 10/04 0700 In: 610 [P.O.:610] Out: 2400 [Urine:2400] Intake/Output from this shift: Total I/O In: 360 [P.O.:360] Out: 750 [Urine:750]  Labs:  Recent Labs  02/02/15 0525  WBC 11.8*  HGB 10.2*  PLT 315  CREATININE 1.08   Estimated Creatinine Clearance: 68.5 mL/min (by C-G formula based on Cr of 1.08).  Recent Labs  02/03/15 1048  [place] 22*     Microbiology: Recent Results (from the past 720 hour(s))  Culture, blood (routine x 2)     Status: None   Collection Time: 01/26/15  9:28 PM  Result Value Ref Range Status   Specimen Description BLOOD LEFT ANTECUBITAL  Final   Special Requests BOTTLES DRAWN AEROBIC AND ANAEROBIC 5CC   Final   Culture  Setup Time   Final    GRAM POSITIVE COCCI IN CLUSTERS BOTTLES DRAWN AEROBIC ONLY CRITICAL RESULT CALLED TO, READ BACK BY AND VERIFIED WITH: STASCHA @0012  01/28/15 MKELLY    Culture   Final    STAPHYLOCOCCUS SPECIES (COAGULASE NEGATIVE) THE SIGNIFICANCE OF ISOLATING THIS ORGANISM FROM A SINGLE SET OF BLOOD CULTURES WHEN MULTIPLE SETS ARE DRAWN IS UNCERTAIN. PLEASE NOTIFY THE MICROBIOLOGY DEPARTMENT WITHIN ONE WEEK IF SPECIATION AND SENSITIVITIES ARE REQUIRED.    Report Status 01/29/2015 FINAL  Final  Culture, blood (routine x 2)     Status: None   Collection Time: 01/26/15  9:39 PM  Result Value Ref Range Status   Specimen Description BLOOD LEFT HAND  Final   Special Requests BOTTLES DRAWN AEROBIC AND ANAEROBIC 5CC   Final   Culture NO GROWTH 5 DAYS  Final   Report Status 01/31/2015 FINAL   Final  Urine culture     Status: None   Collection Time: 01/26/15 10:17 PM  Result Value Ref Range Status   Specimen Description URINE, CLEAN CATCH  Final   Special Requests NONE  Final   Culture MULTIPLE SPECIES PRESENT, SUGGEST RECOLLECTION  Final   Report Status 01/28/2015 FINAL  Final  Culture, blood (routine x 2)     Status: None   Collection Time: 01/28/15  4:23 PM  Result Value Ref Range Status   Specimen Description BLOOD LEFT HAND  Final   Special Requests BOTTLES DRAWN AEROBIC AND ANAEROBIC 5CC  Final   Culture NO GROWTH 5 DAYS  Final   Report Status 02/02/2015 FINAL  Final  Wound culture     Status: None   Collection Time: 01/29/15  4:30 PM  Result Value Ref Range Status   Specimen Description WOUND BACK  Final   Special Requests ASPIRATION AT L1 L2  Final   Gram Stain   Final    RAM WBC PRESENT,BOTH PMN AND MONONUCLEAR NO SQUAMOUS EPITHELIAL CELLS SEEN NO ORGANISMS SEEN Performed at [facility]    Culture   Final    NO GROWTH 2 DAYS Performed at [facility]    Report Status 02/01/2015 FINAL  Final    Anti-infectives    Start     Dose/Rate Route Frequency Ordered Stop   01/31/15 2300  vancomycin (VANCOCIN) IVPB 1000 mg/200 mL premix  Status:  Discontinued     1,000 mg 200 mL/hr over 60 Minutes Intravenous Every 12 hours 01/31/15 1220 02/03/15 1218   01/28/15 1530  rifampin (RIFADIN) capsule 300 mg     300 mg Oral Daily 01/28/15 1502     01/27/15 1200  vancomycin (VANCOCIN) 1,250 mg in sodium chloride 0.9 % 250 mL IVPB  Status:  Discontinued     1,250 mg 166.7 mL/hr over 90 Minutes Intravenous Every 24 hours 01/27/15 0210 01/31/15 1220   01/27/15 0600  piperacillin-tazobactam (ZOSYN) IVPB 3.375 g  Status:  Discontinued     3.375 g 12.5 mL/hr over 240 Minutes Intravenous Every 8 hours 01/27/15 0210 01/29/15 1004   01/27/15 0145  piperacillin-tazobactam (ZOSYN) IVPB 3.375 g  Status:  Discontinued     3.375 g 100 mL/hr over 30 Minutes  Intravenous 3 times per day 01/27/15 0138 01/27/15 0206   01/27/15 0015  vancomycin (VANCOCIN) IVPB 1000 mg/200 mL premix     1,000 mg 200 mL/hr over 60 Minutes Intravenous  Once 01/27/15 0005 01/27/15 0159   01/27/15 0015  piperacillin-tazobactam (ZOSYN) IVPB 3.375 g     3.375 g 100 mL/hr over 30 Minutes Intravenous  Once 01/27/15 0005 01/27/15 0150      Assessment: 82 year old male with discitis and osteomyelitis on Day #8 of antibiotic therapy, currently Vancomycin and rifampin. One of his blood cultures grew Coagulase-negative Staph which likely indicates contamination. His disc aspirate cultures and repeat blood cultures show no growth.  SCr improved to 1.08 as of 10/03. A vancomycin trough drawn at 1048 this morning was above goal at 49mcg/mL on 1 g IV q12h.   Goal of Therapy:  Vancomycin trough level 15-20 mcg/ml  Plan:  - change vancomycin dosing to 750 g IV q12h for estimated trough of ~10mcg/mL -continue rifampin 300mg  PO daily as per MD -follow c/s, surgical plans, LOT, renal function  Rise Patience, PharmD Candidate   Addendum:  I have reviewed the above note, in summary, Pt will be continued on vancomycin and rifampin for 6 weeks (trough 11/10) for diskitis. Current plan is for PICC placement and discharge to SNF when bed is available. Vancomycin trough is slightly supratherapeutic this morning, pt's renal function has been improving, with good urine output.  I agree with the plan of decreasing vancomycin to 750 mg Q 12 hrs. If need to consolidate to a Q 24 hr regimen for convenient home administration, could consider 1750 mg Q 24 hrs. We will continue monitor renal function and f/u discharge plans.  Maryanna Shape, PharmD, BCPS  Clinical Pharmacist  Pager: [phone]

## 2015-02-03 NOTE — Progress Notes (Signed)
Patient lying in bed, no distress at this time Call light within reach

## 2015-02-03 NOTE — Progress Notes (Signed)
PT Cancellation Note  Patient Details Name: Kevin Rogers MRN: [id number] DOB: 09-27-1931   Cancelled Treatment:    Reason Eval/Treat Not Completed: Fatigue limiting ability to participate; Pain limiting ability to participate  Patient up in chair stating he just returned from bathroom and just prior to that had worked with OT. Stated he currently is too painful and knows activity will "stir things up again." Noted pt has 30 minutes prior to next dose of pain medicine. RN in and aware (working on getting incr fentanyl patch).   Kamela Blansett 02/03/2015, 3:05 PM Pager [phone]

## 2015-02-03 NOTE — Clinical Social Work Note (Signed)
Clinical Social Work Assessment  Patient Details  Name: Kevin Rogers MRN: [id number] Date of Birth: 1931/05/30  Date of referral:  02/03/15               Reason for consult:  Facility Placement                Permission sought to share information with:  Facility Art therapist granted to share information::  Yes, Verbal Permission Granted  Name::        Agency::     Relationship::     Contact Information:     Housing/Transportation Living arrangements for the past 2 months:  Single Family Home Source of Information:  Patient Patient Interpreter Needed:  None Criminal Activity/Legal Involvement Pertinent to Current Situation/Hospitalization:  No - Comment as needed Significant Relationships:    Lives with:  Self Do you feel safe going back to the place where you live?  Yes Need for family participation in patient care:  No (Coment)  Care giving concerns:  Pt was living alone. Pt recommend SNF   Social Worker assessment / plan:  BSW entered Pt room with [employer]. Pt was up and alert eating breakfast. Pt was notified that PT is recommending SNF. BSW and CSW explained recommendation and referral process.   Employment status:  Disabled (Comment on whether or not currently receiving Disability) Insurance information:  Managed Medicare PT Recommendations:  [place] / Referral to community resources:  Acute Rehab  Patient/Family's Response to care: Pt was agreeable with current plan but would like to see how Pt medical needs are before making a discharge plan final.Pt was understanding and agreeable to referral being sent to [facility] as his first choice, as well as the other facilities in [place].   Patient/Family's Understanding of and Emotional Response to Diagnosis, Current Treatment, and Prognosis:  Pt has good insight on Pt condition.  Emotional Assessment Appearance:  Appears stated age Attitude/Demeanor/Rapport:   Other (pleaseant, caring, kind) Affect (typically observed):  Accepting, Pleasant Orientation:  Oriented to Self, Oriented to Place, Oriented to  Time, Oriented to Situation Alcohol / Substance use:  Alcohol Use, Tobacco Use (quit smoking 19 years ago; drink a 6 oz per week) Psych involvement (Current and /or in the community):  No (Comment)  Discharge Needs  Concerns to be addressed:  No discharge needs identified Readmission within the last 30 days:  No Current discharge risk:  None Barriers to Discharge:  No Barriers Identified   Charlean McNeill, Student-SW 02/03/2015, 8:34 AM   CSW reviewed and agrees with the above assessment.  Domenica Reamer, [place] Social Worker [phone]

## 2015-02-03 NOTE — Progress Notes (Addendum)
Occupational Therapy Treatment Patient Details Name: Kevin Rogers MRN: [id number] DOB: October 25, 1931 Today's Date: 02/03/2015    History of present illness Pt is an 81 y.o. male with a PMH of lumbar spinal stenosis s/p lumbar laminectomy, lumbar fusion, lumbar wound debridement on 07/23/14, HTN, DM, gout, leukemia, BPH. Pt presents with worsening fever and back pain. CT scan was done which shows evidence for discitis. There is loosening of the L1 and L3 pedicle screws. All these findings are concerning for infection of hardware.   OT comments  Pt progressing. Education provided in session. Pt planning to go to SNF.  Follow Up Recommendations  SNF    Equipment Recommendations   (Pt has 3:1 and will get friend to purchase tub/shower seat; AE)    Recommendations for Other Services      Precautions / Restrictions Precautions Precautions: Fall;Back Precaution Booklet Issued: No Precaution Comments: reviewed back precautions Restrictions Weight Bearing Restrictions: No       Mobility Bed Mobility               General bed mobility comments: not assessed-pt in chair  Transfers Overall transfer level: Needs assistance Equipment used: Rolling walker (2 wheeled) Transfers: Sit to/from Stand Sit to Stand: Min assist         General transfer comment: assist to boost from chair    Balance                                    ADL Overall ADL's : Needs assistance/impaired     Grooming: Wash/dry face; oral care; applied deodorant; Set up;Supervision/safety;Standing (dryed hands); cues given for precautions   Upper Body Bathing: Set up;Supervision/ safety;Standing Upper Body Bathing Details (indicate cue type and reason): cues to stand upright-washed armpits Lower Body Bathing: Min guard (standing) Lower Body Bathing Details (indicate cue type and reason): washed peri area and applied body spray to area   Upper Body Dressing Details (indicate cue type and  reason): OT assisted with gown     Toilet Transfer: Ambulation;RW;Minimal assistance (sit to stand from chair-Min assist; Min guard ambulating)           Functional mobility during ADLs: Min guard;Rolling walker General ADL Comments: Educated on AE and LB ADL technique-pt able to cross one leg over knee better than the other. Cues given for precautions in session.  Tactile and verbal cues for precautions.      Vision                     Perception     Praxis      Cognition  Awake/Alert Behavior During Therapy: WFL for tasks assessed/performed Overall Cognitive Status: Within Functional Limits for tasks assessed       Memory: Decreased short-term memory;Decreased recall of precautions (several cues for precautions)               Extremity/Trunk Assessment               Exercises     Shoulder Instructions       General Comments      Pertinent Vitals/ Pain       Pain Assessment: 0-10 Pain Score: 7  (no pain sitting still, but had pain when up and moving) Pain Location: back Pain Intervention(s): Monitored during session;Repositioned  Home Living  Prior Functioning/Environment              Frequency Min 2X/week     Progress Toward Goals  OT Goals(current goals can now be found in the care plan section)  Progress towards OT goals: Progressing toward goals  Acute Rehab OT Goals Patient Stated Goal: get out of the hospital OT Goal Formulation: With patient Time For Goal Achievement: 02/11/15 Potential to Achieve Goals: Good ADL Goals Pt Will Perform Grooming: with modified independence;standing Pt Will Perform Lower Body Bathing: with supervision;sit to/from stand;with adaptive equipment Pt Will Perform Lower Body Dressing: with supervision;sit to/from stand;with adaptive equipment Pt Will Transfer to Toilet: with modified independence;bedside commode;ambulating Pt Will  Perform Toileting - Clothing Manipulation and hygiene: with modified independence;sit to/from stand Pt Will Perform Tub/Shower Transfer: Shower transfer;shower seat;rolling walker;ambulating;with supervision  Plan Discharge plan needs to be updated    Co-evaluation                 End of Session Equipment Utilized During Treatment: Gait belt;Rolling walker   Activity Tolerance Patient tolerated treatment well   Patient Left in chair;with call bell/phone within reach   Nurse Communication Mobility status        Time: 9024-0973 OT Time Calculation (min): 17 min  Charges: OT General Charges $OT Visit: 1 Procedure OT Treatments $Self Care/Home Management : 8-22 mins   Benito Mccreedy OTR/L [phone] 02/03/2015, 3:27 PM

## 2015-02-04 DIAGNOSIS — M5489 Other dorsalgia: Secondary | ICD-10-CM | POA: Diagnosis not present

## 2015-02-04 DIAGNOSIS — Z8249 Family history of ischemic heart disease and other diseases of the circulatory system: Secondary | ICD-10-CM | POA: Diagnosis not present

## 2015-02-04 DIAGNOSIS — E1122 Type 2 diabetes mellitus with diabetic chronic kidney disease: Secondary | ICD-10-CM | POA: Insufficient documentation

## 2015-02-04 DIAGNOSIS — I10 Essential (primary) hypertension: Secondary | ICD-10-CM | POA: Diagnosis not present

## 2015-02-04 DIAGNOSIS — N183 Chronic kidney disease, stage 3 (moderate): Secondary | ICD-10-CM | POA: Diagnosis not present

## 2015-02-04 DIAGNOSIS — E119 Type 2 diabetes mellitus without complications: Secondary | ICD-10-CM | POA: Diagnosis not present

## 2015-02-04 DIAGNOSIS — C9110 Chronic lymphocytic leukemia of B-cell type not having achieved remission: Secondary | ICD-10-CM

## 2015-02-04 DIAGNOSIS — H409 Unspecified glaucoma: Secondary | ICD-10-CM | POA: Diagnosis not present

## 2015-02-04 DIAGNOSIS — D638 Anemia in other chronic diseases classified elsewhere: Secondary | ICD-10-CM | POA: Diagnosis not present

## 2015-02-04 DIAGNOSIS — E538 Deficiency of other specified B group vitamins: Secondary | ICD-10-CM | POA: Diagnosis not present

## 2015-02-04 DIAGNOSIS — M549 Dorsalgia, unspecified: Secondary | ICD-10-CM | POA: Diagnosis not present

## 2015-02-04 DIAGNOSIS — Z981 Arthrodesis status: Secondary | ICD-10-CM | POA: Diagnosis not present

## 2015-02-04 DIAGNOSIS — R197 Diarrhea, unspecified: Secondary | ICD-10-CM | POA: Diagnosis not present

## 2015-02-04 DIAGNOSIS — M4646 Discitis, unspecified, lumbar region: Secondary | ICD-10-CM | POA: Insufficient documentation

## 2015-02-04 DIAGNOSIS — A498 Other bacterial infections of unspecified site: Secondary | ICD-10-CM | POA: Diagnosis not present

## 2015-02-04 DIAGNOSIS — G8929 Other chronic pain: Secondary | ICD-10-CM | POA: Diagnosis not present

## 2015-02-04 DIAGNOSIS — E1310 Other specified diabetes mellitus with ketoacidosis without coma: Secondary | ICD-10-CM | POA: Diagnosis not present

## 2015-02-04 DIAGNOSIS — T814XXA Infection following a procedure, initial encounter: Secondary | ICD-10-CM | POA: Diagnosis not present

## 2015-02-04 DIAGNOSIS — E1142 Type 2 diabetes mellitus with diabetic polyneuropathy: Secondary | ICD-10-CM | POA: Diagnosis not present

## 2015-02-04 DIAGNOSIS — I129 Hypertensive chronic kidney disease with stage 1 through stage 4 chronic kidney disease, or unspecified chronic kidney disease: Secondary | ICD-10-CM | POA: Diagnosis not present

## 2015-02-04 DIAGNOSIS — B9689 Other specified bacterial agents as the cause of diseases classified elsewhere: Secondary | ICD-10-CM | POA: Diagnosis not present

## 2015-02-04 DIAGNOSIS — N400 Enlarged prostate without lower urinary tract symptoms: Secondary | ICD-10-CM | POA: Diagnosis not present

## 2015-02-04 DIAGNOSIS — M4626 Osteomyelitis of vertebra, lumbar region: Secondary | ICD-10-CM | POA: Diagnosis not present

## 2015-02-04 DIAGNOSIS — Z8739 Personal history of other diseases of the musculoskeletal system and connective tissue: Secondary | ICD-10-CM | POA: Insufficient documentation

## 2015-02-04 DIAGNOSIS — M96842 Postprocedural seroma of a musculoskeletal structure following a musculoskeletal system procedure: Secondary | ICD-10-CM | POA: Diagnosis not present

## 2015-02-04 DIAGNOSIS — N182 Chronic kidney disease, stage 2 (mild): Secondary | ICD-10-CM

## 2015-02-04 DIAGNOSIS — Z79899 Other long term (current) drug therapy: Secondary | ICD-10-CM | POA: Diagnosis not present

## 2015-02-04 DIAGNOSIS — Z4889 Encounter for other specified surgical aftercare: Secondary | ICD-10-CM | POA: Diagnosis not present

## 2015-02-04 DIAGNOSIS — M8610 Other acute osteomyelitis, unspecified site: Secondary | ICD-10-CM | POA: Diagnosis not present

## 2015-02-04 DIAGNOSIS — R509 Fever, unspecified: Secondary | ICD-10-CM | POA: Diagnosis not present

## 2015-02-04 DIAGNOSIS — R6510 Systemic inflammatory response syndrome (SIRS) of non-infectious origin without acute organ dysfunction: Secondary | ICD-10-CM | POA: Diagnosis not present

## 2015-02-04 DIAGNOSIS — Z87891 Personal history of nicotine dependence: Secondary | ICD-10-CM | POA: Diagnosis not present

## 2015-02-04 DIAGNOSIS — Z792 Long term (current) use of antibiotics: Secondary | ICD-10-CM | POA: Diagnosis not present

## 2015-02-04 DIAGNOSIS — Z959 Presence of cardiac and vascular implant and graft, unspecified: Secondary | ICD-10-CM | POA: Diagnosis not present

## 2015-02-04 DIAGNOSIS — A047 Enterocolitis due to Clostridium difficile: Secondary | ICD-10-CM | POA: Diagnosis not present

## 2015-02-04 DIAGNOSIS — Z48817 Encounter for surgical aftercare following surgery on the skin and subcutaneous tissue: Secondary | ICD-10-CM | POA: Diagnosis not present

## 2015-02-04 DIAGNOSIS — M109 Gout, unspecified: Secondary | ICD-10-CM | POA: Diagnosis not present

## 2015-02-04 DIAGNOSIS — C9510 Chronic leukemia of unspecified cell type not having achieved remission: Secondary | ICD-10-CM | POA: Diagnosis not present

## 2015-02-04 DIAGNOSIS — M545 Low back pain: Secondary | ICD-10-CM | POA: Diagnosis not present

## 2015-02-04 DIAGNOSIS — M1A9XX0 Chronic gout, unspecified, without tophus (tophi): Secondary | ICD-10-CM | POA: Diagnosis not present

## 2015-02-04 DIAGNOSIS — A419 Sepsis, unspecified organism: Secondary | ICD-10-CM | POA: Diagnosis not present

## 2015-02-04 DIAGNOSIS — Z794 Long term (current) use of insulin: Secondary | ICD-10-CM | POA: Diagnosis not present

## 2015-02-04 DIAGNOSIS — E785 Hyperlipidemia, unspecified: Secondary | ICD-10-CM | POA: Diagnosis not present

## 2015-02-04 DIAGNOSIS — C9111 Chronic lymphocytic leukemia of B-cell type in remission: Secondary | ICD-10-CM | POA: Diagnosis not present

## 2015-02-04 DIAGNOSIS — N429 Disorder of prostate, unspecified: Secondary | ICD-10-CM | POA: Diagnosis not present

## 2015-02-04 LAB — BASIC METABOLIC PANEL
Anion gap: 12 (ref 5–15)
BUN: 16 mg/dL (ref 6–20)
CO2: 23 mmol/L (ref 22–32)
Calcium: 9.5 mg/dL (ref 8.9–10.3)
Chloride: 99 mmol/L — ABNORMAL LOW (ref 101–111)
Creatinine, Ser: 1.28 mg/dL — ABNORMAL HIGH (ref 0.61–1.24)
GFR calc Af Amer: 59 mL/min — ABNORMAL LOW (ref >60)
GFR calc non Af Amer: 51 mL/min — ABNORMAL LOW (ref >60)
Glucose, Bld: 173 mg/dL — ABNORMAL HIGH (ref 65–99)
Potassium: 4.5 mmol/L (ref 3.5–5.1)
Sodium: 134 mmol/L — ABNORMAL LOW (ref 135–145)

## 2015-02-04 LAB — CBC
HCT: 35.6 % — ABNORMAL LOW (ref 39.0–52.0)
Hemoglobin: 11.4 g/dL — ABNORMAL LOW (ref 13.0–17.0)
MCH: 27.3 pg (ref 26.0–34.0)
MCHC: 32 g/dL (ref 30.0–36.0)
MCV: 85.4 fL (ref 78.0–100.0)
Platelets: 370 10*3/uL (ref 150–400)
RBC: 4.17 MIL/uL — ABNORMAL LOW (ref 4.22–5.81)
RDW: 15 % (ref 11.5–15.5)
WBC: 13.3 10*3/uL — ABNORMAL HIGH (ref 4.0–10.5)

## 2015-02-04 LAB — GLUCOSE, CAPILLARY
Glucose-Capillary: 129 mg/dL — ABNORMAL HIGH (ref 65–99)
Glucose-Capillary: 98 mg/dL (ref 65–99)

## 2015-02-04 MED ORDER — FENTANYL 50 MCG/HR TD PT72: 50.0000 ug | MEDICATED_PATCH | TRANSDERMAL | Status: DC

## 2015-02-04 MED ORDER — SODIUM CHLORIDE 0.9 % IJ SOLN: 10.0000 mL | INTRAMUSCULAR | Status: DC | PRN

## 2015-02-04 MED ORDER — RIFAMPIN 300 MG PO CAPS: 300.0000 mg | ORAL_CAPSULE | Freq: Every day | ORAL | Status: DC

## 2015-02-04 MED ORDER — VANCOMYCIN HCL IN DEXTROSE 750-5 MG/150ML-% IV SOLN: 750.0000 mg | Freq: Two times a day (BID) | INTRAVENOUS | Status: DC

## 2015-02-04 MED ORDER — OXYCODONE-ACETAMINOPHEN 5-325 MG PO TABS: 1.0000 | ORAL_TABLET | ORAL | Status: DC | PRN

## 2015-02-04 NOTE — Progress Notes (Signed)
Pt discharge education and instructions completed. Pt discharge to [facility] with family to transport him to disposition. Pt peripheral IV removed, Right upper arm single lumen PICC intact; telemetry removed; pt report called off to [place] a Marine scientist at [employer]. Pt family handed the discharge packet provided by CSW. Pt transported off unit via wheelchair with belongings and family at side. Francis Gaines Devorah Givhan RN.

## 2015-02-04 NOTE — Progress Notes (Signed)
Patient will discharge to [facility] Anticipated discharge date:02/04/15 Family notified: pt notified friend and son Transportation by family- family able to pick up around 3-3:30pm  CSW signing off.  Domenica Reamer, [place] Social Worker [phone]

## 2015-02-04 NOTE — Progress Notes (Signed)
Pt has chosen [facility]- they can accept pt once PICC line is placed  CSW will continue to follow.  Domenica Reamer, [place] Social Worker [phone]

## 2015-02-04 NOTE — Progress Notes (Signed)
Patient lying in bed watching television, no distress. Call light within reach.

## 2015-02-04 NOTE — Progress Notes (Signed)
Physical Therapy Treatment Patient Details Name: Kevin Rogers MRN: [id number] DOB: [birth year] Today's Date: 02/04/2015    History of Present Illness Pt is an 81 y/o male with a PMH of lumbar spinal stenosis s/p lumbar laminectomy, lumbar fusion, lumbar wound debridement on 07/23/14, HTN, DM, gout, leukemia, BPH. Pt presents with worsening fever and back pain. CT scan was done which shows evidence for discitis. There is loosening of the L1 and L3 pedicle screws. All these findings are concerning for infection of hardware.    PT Comments    Patient continues to be limited by decr knowledge of safe use of DME, back pain, and postural limitations that contribute to pain and unsteadiness.   Follow Up Recommendations  SNF (pt reports he feels he needs more help than friend can provi)     Equipment Recommendations  None recommended by PT    Recommendations for Other Services       Precautions / Restrictions Precautions Precautions: Fall;Back Precaution Booklet Issued: No Precaution Comments: Reviewed back precautions for comfort. Pt is familiar from previous surgeries. Encouraged log roll and sitting upright with feet on floor to limit strain on low back.  Restrictions Weight Bearing Restrictions: No    Mobility  Bed Mobility Overal bed mobility: Needs Assistance Bed Mobility: Rolling;Sidelying to Sit Rolling: Modified independent (Device/Increase time) Sidelying to sit: HOB elevated;Supervision       General bed mobility comments: pt able to verbalize how he should get up, however did NOT want the [facility] lowered (reports too painful); +use of rail; with HOB elevated did roll and move side to sit to minimize strain/pain on back  Transfers Overall transfer level: Needs assistance Equipment used: Rolling walker (2 wheeled) Transfers: Sit to/from Stand Sit to Stand: Min guard         General transfer comment: from bed elevated to simulate his bed at home; closeguard due to  unsteady/weak legs when transitioning hands from bed to RW (no assist required)  Ambulation/Gait Ambulation/Gait assistance: Min guard Ambulation Distance (Feet): 200 Feet Assistive device: Rolling walker (2 wheeled) Gait Pattern/deviations: Step-through pattern;Decreased stride length;Shuffle;Trunk flexed Gait velocity: Decreased   General Gait Details: VC's for improved posture and walker placement closer to pt's body. Overall steady, however it appears that pt is labored in ambulating. Pt can stand erect and close to RW for only 5 seconds at a time (reports pain increases)   Stairs            Wheelchair Mobility    Modified Rankin (Stroke Patients Only)       Balance     Sitting balance-Leahy Scale: Fair     Standing balance support: No upper extremity supported Standing balance-Leahy Scale: Poor (crouched posture) Standing balance comment: stood at sink for ADLs x 4 minutes                    Cognition Arousal/Alertness: Awake/alert Behavior During Therapy: WFL for tasks assessed/performed Overall Cognitive Status: Within Functional Limits for tasks assessed                      Exercises Other Exercises Other Exercises: Pt reported increased pain in low back if he tries to extend knees in standing; pt with tight hamstrings and instructed in DF with knee extension x 30 seconds each leg at least every hour    General Comments        Pertinent Vitals/Pain Pain Assessment: 0-10 Pain Score: 5  Pain Location: low  back Pain Descriptors / Indicators: Aching;Guarding Pain Intervention(s): Limited activity within patient's tolerance;Monitored during session;Repositioned;Other (comment) (educated on posture and hamstring stretch)    Home Living                      Prior Function            PT Goals (current goals can now be found in the care plan section) Acute Rehab PT Goals Patient Stated Goal: Return home Time For Goal  Achievement: 02/05/15 (TBA if not d/c'd to SNF today) Progress towards PT goals: Progressing toward goals    Frequency  Min 3X/week    PT Plan Current plan remains appropriate    Co-evaluation             End of Session Equipment Utilized During Treatment: Gait belt Activity Tolerance: Patient limited by pain Patient left: with call bell/phone within reach;in chair     Time: 4327-6147 PT Time Calculation (min) (ACUTE ONLY): 26 min  Charges:  $Gait Training: 23-37 mins                    G Codes:      Dionna Wiedemann 02-05-15, 9:43 AM  Pager [phone]

## 2015-02-04 NOTE — Progress Notes (Addendum)
Patient asleep, call light within reach. Will call if he needs pain medication

## 2015-02-04 NOTE — Discharge Summary (Signed)
Physician Discharge Summary  Kevin Rogers KVQ:[id number] DOB: 1931/08/12 DOA: 01/26/2015  PCP: Garret Reddish, MD  Admit date: 01/26/2015 Discharge date: 02/04/2015  Time spent: 65 minutes  Recommendations for Outpatient Follow-up:  1. Follow-up with Dr. Johnnye Sima, infectious diseases in 3 weeks. 2. Follow-up with M.D. at skilled nursing facility. Patient's blood pressure need to be reassessed as his ARB was discontinued during this hospitalization secondary to borderline blood pressure. Patient's pain will also need to be managed in medication doses may be titrated by M.D. at skilled nursing facility.  Discharge Diagnoses:  Principal Problem:   Lumbar discitis Active Problems:   Chronic lymphocytic leukemia (HCC)   Diabetes mellitus type II, controlled ([place])   B12 deficiency   Gout   Diabetic polyneuropathy (HCC)   Glaucoma   Essential hypertension   BPH (benign prostatic hyperplasia)   Lumbar spinal stenosis   CKD (chronic kidney disease), stage III   Sepsis ([place])   Fever   Back pain   Hyperlipidemia   Controlled type 2 diabetes mellitus with stage 2 chronic kidney disease, without long-term current use of insulin ([place])   Discitis of lumbar region   Osteomyelitis of lumbar vertebra [facility])   Discharge Condition: Stable and improved  Diet recommendation: carb modified  Filed Weights   01/26/15 2017 01/26/15 2245  Weight: 108.41 kg (239 lb) 105.49 kg (232 lb 9 oz)    History of present illness:  Per Dr Larwance Rote is a 81 y.o. male with PMH of lumbar spinal stenosis, s/p of lumbar laminectomy, lumbar fusion procedure, lumbar wound debridement on 07/23/14 by Dr. Hal Neer, hypertension, diabetes mellitus, gout, chronic leukemia, BPH, who presented with a fever and a worsening back pain.  Patient reported that he has been having fever and worsening back pain in the past 2 days prior to admission . He did not have injury to his back. He did not have weakness, numbness or  tingling sensations in his lower extremities, no urinary incontinence, or loss controlled of bowel movement. He stated that he always felt cold in right leg, and left leg is more swollen than the right, which is chronic issue, has not changed recently. Patient did not have cough, shortness breath, chest pain, any nose, sore throat, symptoms of a UTI, abdominal pain, nausea, vomiting, diarrhea, rashes.  In ED, patient was found to have WBC14.6 which was 15.1 on 08/14/14, temperature 102.5, tachycardia, stable renal function, negative urinalysis, elevated lactate 2.21 which improved to 0.82 after 2L normal saline bolus in emergency room. Chest x-ray was negative for acute abnormalities. X-ray of lumbar spin showed L1 and L2 superior endplate fractures which have developed since 09/09/2014; and L1-2 thru L3-4 PLIF with posterior fixation hardware from L1-L3; no hardware displacement  Hospital Course:  #1 L1-L2 discitis and osteomyelitis Patient had presented with fever and worsening back pain that started 2 days prior to admission. Plain films of the back were obtained that showed L1-L2 superior endplate fractures had developed since 09/09/2014. CT and MRI of the L-spine were obtained which were concerning for discitis and osteomyelitis in the L1-L2 region. Neurosurgery was consulted as well as infectious diseases. Patient was placed on IV vancomycin and rifampin. Interventional radiology was also consulted for aspiration. Patient was pancultured with 1/2 blood cultures growing coagulase negative staph. Neurosurgery did not feel that any surgical procedures were needed at this time the patient was to be continued on empiric antibiotics. Aspiration of the lumbar space which was done at all growth. Patient's fentanyl patch  dose was increased and patient's pain managed. It was felt by ID that patient needed 6 weeks of antibiotics and a such a PICC line was placed. Patient be discharged to skilled nursing facility on  IV vancomycin and oral rifampin and is to follow-up with ID in approximately 3 weeks. Pharmacist at the facility will need to dose the fact and will likely need weekly CBCs with differential, CMET, CRP, sedimentation rate, vancomycin trough and labs could be faxed weekly to the ID clinic at [phone]. Patient improved clinically and will be discharged in stable and improved condition.  #2 hypertension Patient's blood pressure was borderline during the hospitalization a such patient's ARB was discontinued. Patient's blood pressure remained stable. Patient will follow-up with M.D. at the skilled nursing facility.  #3 chronic lymphocytic leukemia Patient's baseline white blood cell count is between 12-17. Patient noted to have a white count of 14.6 on admission with a 55% lymphocytes. Patient remained stable. Patient is to follow-up with his oncologist as scheduled.  #4 well-controlled type 2 diabetes Hemoglobin A1c on 01/13/2015 was 5.6. Patient was maintained on a sliding scale during this hospitalization. Patient will resume his home oral hypoglycemic agents on discharge.  #5 chronic kidney disease stage III Baseline creatinine 1.27-1.45. Remained stable throughout the hospitalization.  #6 asymmetric lower extremity edema Patient reported asymmetry of edema left greater than right which was chronic in nature. Patient with good pulses bilaterally. Lower extremity Dopplers which were done were negative for DVT.   The rest of patient's chronic medical issues remained stable throughout the hospitalization and patient was discharged in stable and improved condition.  Procedures:  CT-guided aspiration of L1-L2 disc space per Dr. Anselm Pancoast interventional radiology 01/29/2015  PICC line placement 02/04/2015  CT L spine 01/27/2015  MRI L-spine 01/27/2015  Plain films of the L-spine 01/26/2015  Chest x-ray 01/26/2015   left lower extremity Doppler 01/29/2015  Consultations:  Infectious  diseases: Dr. Johnnye Sima 01/28/2015  Interventional radiology Dr. Anselm Pancoast 01/29/2015  Neurosurgery: Dr. Hal Neer 01/29/2015  Discharge Exam: Filed Vitals:   02/04/15 0440  BP: 116/63  Pulse: 65  Temp: 98.3 F (36.8 C)  Resp: 17    General: NAD Cardiovascular: RRR Respiratory: CTAB anterior lung fields  Discharge Instructions   Discharge Instructions    Diet Carb Modified    Complete by:  As directed      Discharge instructions    Complete by:  As directed   Follow up in ID clinic in 3 weeks.     Increase activity slowly    Complete by:  As directed           Current Discharge Medication List    START taking these medications   Details  fentaNYL (DURAGESIC - DOSED MCG/HR) 50 MCG/HR Place 1 patch (50 mcg total) onto the skin every 3 (three) days. Qty: 5 patch, Refills: 0    rifampin (RIFADIN) 300 MG capsule Take 1 capsule (300 mg total) by mouth daily. Take for 6 weeks Qty: 42 capsule, Refills: 0    Vancomycin (VANCOCIN) 750 MG/150ML SOLN Inject 150 mLs (750 mg total) into the vein every 12 (twelve) hours. Take for 6 weeks Qty: 8000 mL, Refills: 0      CONTINUE these medications which have CHANGED   Details  oxyCODONE-acetaminophen (PERCOCET/ROXICET) 5-325 MG tablet Take 1-2 tablets by mouth every 4 (four) hours as needed for moderate pain. Qty: 30 tablet, Refills: 0      CONTINUE these medications which have NOT CHANGED  Details  acetaminophen (TYLENOL) 500 MG tablet Take 500-1,000 mg by mouth every 6 (six) hours as needed (pain).    allopurinol (ZYLOPRIM) 300 MG tablet Take 1 tablet by mouth  daily Qty: 90 tablet, Refills: 2    glipiZIDE (GLUCOTROL XL) 5 MG 24 hr tablet Take 1 tablet (5 mg total) by mouth daily. Qty: 90 tablet, Refills: 2    nabumetone (RELAFEN) 500 MG tablet Take 500 mg by mouth 2 (two) times daily as needed. Stopped for procedure Refills: 3    tamsulosin (FLOMAX) 0.4 MG CAPS capsule Take 1 capsule by mouth  daily Qty: 90 capsule,  Refills: 1    TRAVATAN Z 0.004 % SOLN ophthalmic solution Place 1 drop into both eyes at bedtime.     vitamin B-12 (CYANOCOBALAMIN) 1000 MCG tablet Take 1,000 mcg by mouth daily.    glucose blood (ONETOUCH VERIO) test strip Use to test blood sugars daily. Dx: E11.9 Qty: 100 each, Refills: 12    ONE TOUCH LANCETS MISC Use to test blood sugars. Dx:E11.9 Qty: 100 each, Refills: 11      STOP taking these medications     losartan (COZAAR) 100 MG tablet      meloxicam (MOBIC) 15 MG tablet        No Known Allergies Follow-up Information    Follow up with Bobby Rumpf, MD. Schedule an appointment as soon as possible for a visit in 3 weeks.   Specialty:  Infectious Diseases   Contact information:   [address] [place] [address] [phone]       Please follow up.   Why:  f/u with MD at SNF       The results of significant diagnostics from this hospitalization (including imaging, microbiology, ancillary and laboratory) are listed below for reference.    Significant Diagnostic Studies: Dg Chest 2 View  01/26/2015   CLINICAL DATA:  Back pain tonight with fever.  Recent back surgery  EXAM: CHEST  2 VIEW  COMPARISON:  January 01, 2013  FINDINGS: The heart size and mediastinal contours are within normal limits. The aorta is tortuous. There is no focal infiltrate, pulmonary edema, or pleural effusion. The visualized skeletal structures are unremarkable.  IMPRESSION: No active cardiopulmonary disease.   Electronically Signed   By: Abelardo Diesel M.D.   On: 01/26/2015 22:35   Dg Lumbar Spine Complete  01/27/2015   CLINICAL DATA:  Lower back pain.  Recent back surgery.  EXAM: LUMBAR SPINE - COMPLETE 4+ VIEW  COMPARISON:  09/09/2014  FINDINGS: Recent L1-2 and L2-3 posterior lumbar interbody fusion with rod and pedicle screw fixation. Since comparison radiograph, there has been superior endplate height loss with narrowing between the endplates and pedicle screws. No  hardware fracture or displacement. Intervertebral cages are located, including pre-existing cage at the L3-4 level.  Disc and facet degeneration is severe and diffuse, including at T12-L1 where there is chronic endplate erosion.  IMPRESSION: 1. L1 and L2 superior endplate fractures which have developed since 09/09/2014. 2. L1-2 thru L3-4 PLIF with posterior fixation hardware from L1-L3. No hardware displacement.   Electronically Signed   By: Monte Fantasia M.D.   On: 01/27/2015 00:07   Ct Lumbar Spine Wo Contrast  01/27/2015   CLINICAL DATA:  Severe back pain. Fever and sepsis of unclear etiology.  EXAM: CT LUMBAR SPINE WITHOUT CONTRAST  TECHNIQUE: Multidetector CT imaging of the lumbar spine was performed without intravenous contrast administration. Multiplanar CT image reconstructions were also generated.  COMPARISON:  CT myelogram 05/13/2014  FINDINGS: Spinal numbering as previously established.  L1-2 and L2-3 posterior lumbar fusion with rod and pedicle screw fixation. Pre-existing L3-4 PLIF with associated posterior fusion hardware removed since the comparison study.  Wide discectomy space at L1-2 with L2 superior endplate fracture (most convincing on coronal reformats) and excessive subsidence that appears chronic, but has occurred since 09/09/2014 radiography. There has also been progressive subsidence into the L1 inferior endplate. The associated endplates are eroded, with underlying sclerosis, even greater than expected based on extensive preoperative subchondral irregularity. Wide lucency around the L1 and L3 pedicle screws, consistent with loosening. No graft displacement.  No L1 superior endplate fracture as suspected on prior radiography. T12-L1 disc narrowing and subchondral erosive changes similar to preoperative myelogram.  Perispinal edematous change, best visualized at the level of T12-L1, but likely also at the L1-L2 level - increasing concern for postoperative infection.  No gross canal or  incisional fluid collection.  No progressive degenerative stenosis compared to CT myelogram comparison.  IMPRESSION: 1. Progressive widening of the L1-2 discectomy site with chronic appearing L2 superior endplate fracture and excessive subsidence. Appearance and history concerning for postoperative discitis, MRI with contrast recommended. 2. Rapid loosening of L1 and L3 pedicle screws. 3. No L1 superior endplate fracture as questioned on previous radiography. 4. No acute finding at the L2-3 and L3-4 discectomy sites. No solid bony fusion.   Electronically Signed   By: Monte Fantasia M.D.   On: 01/27/2015 02:45   Mr Lumbar Spine W Wo Contrast  01/27/2015   CLINICAL DATA:  Back surgery in February 2016 for adjacent segment disease at L1-L2. Subsequent re-exploration 07/23/2014 for seroma. Now with severe back pain and fever. Elevated white count. History of diabetes mellitus.  EXAM: MRI LUMBAR SPINE WITHOUT AND WITH CONTRAST  TECHNIQUE: Multiplanar and multiecho pulse sequences of the lumbar spine were obtained without and with intravenous contrast.  CONTRAST:  45mL MULTIHANCE GADOBENATE DIMEGLUMINE 529 MG/ML IV SOLN  COMPARISON:  CT myelogram from 05/13/2014 is correlated. Also prior MRI from 01/01/2014. CT lumbar spine without contrast 01/27/2015.  FINDINGS: Segmentation: 5 lumbar vertebrae.  Alignment:  Trace anterolisthesis L3-4.  Vertebrae: Recent (2/16) surgery for L1 through L3 decompression, with posterior and interbody fusion using interbody cages and pedicle screw and rod construct. Abnormal vertebral body edema and enhancement above and below the L1-2 interbody cage. In conjunction with the CT appearance of widening of the interspace and endplate erosion, concern for L1-2 diskitis and regional osteomyelitis.  Conus medullaris: Normal in size, signal, and location.  Paraspinal tissues: No evidence for hydronephrosis or paravertebral mass. There is is edema of the RIGHT greater than LEFT psoas muscle,  adjacent to the L1-2 interspace, although no abscess is observed.Well-healed posterior midline incision.  Disc levels:  L1-L2: Interbody cages float in the interspace, surrounded by T2 hyperintense and enhancing material consistent with diskitis. No cage migration, but shallow ventral epidural mass effect is present, not resulting in significant stenosis. Regional edema of the L1 and L2 vertebrae. Unclear if L2 nerve root displacement is present. BILATERAL foraminal narrowing could affect either L1 nerve root.  L2-L3: Developing arthrodesis. No concerning features. No impingement.  L3-L4: Prior XLIF. No definite solid arthrodesis, but no concerning features for infection. Wide posterior decompression. No residual impingement.  L4-L5: Advanced disc space narrowing. Central disc osteophyte complex. Adequate posterior decompression. No impingement.  L5-S1: Central disc osteophyte complex. Facet arthropathy. No impingement.  Compared with the preoperative MR, the stenosis at  L1-2 is improved.  IMPRESSION: When correlated with the recent CT, features are concerning for L1-2 diskitis and osteomyelitis. If there is no organism identified from blood cultures, L1-2 disc space aspiration could be performed.  Shallow ventral soft tissue abnormality at L1-2, without significant stenosis or concern for frank epidural abscess. Regional RIGHT psoas edema is observed.   Electronically Signed   By: Staci Righter M.D.   On: 01/27/2015 19:24   Ct Image Guided Drainage By Percutaneous Catheter  01/29/2015   CLINICAL DATA:  81 year old with back pain and concern for diskitis at L1-L2.  EXAM: CT GUIDED ASPIRATION OF L1-L2 DISC SPACE  ANESTHESIA/SEDATION: 1 mg Versed, 50 mcg fentanyl. A radiology nurse monitored the patient for moderate sedation.  PROCEDURE: Informed consent was obtained for a CT-guided aspiration. The patient was placed prone. CT images through the lumbar spine were obtained. The right flank was prepped with Betadine  and a sterile field was created. [place] needle was directed to the lateral aspect of the L1-L2 disc space. Approximately 1 cc of bloody fluid was aspirated after multiple needle manipulations. Specimens sent for culture. Bandage placed over the puncture site.  COMPLICATIONS: No immediate complication  FINDINGS: Needle directed into the right lateral aspect of the L1-L2 disc space. Small amount of bloody fluid was aspirated.  Estimated blood loss: Minimal  IMPRESSION: CT-guided aspiration of L1-L2 disc space.   Electronically Signed   By: Markus Daft M.D.   On: 01/29/2015 16:33    Microbiology: Recent Results (from the past 240 hour(s))  Culture, blood (routine x 2)     Status: None   Collection Time: 01/26/15  9:28 PM  Result Value Ref Range Status   Specimen Description BLOOD LEFT ANTECUBITAL  Final   Special Requests BOTTLES DRAWN AEROBIC AND ANAEROBIC 5CC   Final   Culture  Setup Time   Final    GRAM POSITIVE COCCI IN CLUSTERS BOTTLES DRAWN AEROBIC ONLY CRITICAL RESULT CALLED TO, READ BACK BY AND VERIFIED WITH: STASCHA @0012  01/28/15 MKELLY    Culture   Final    STAPHYLOCOCCUS SPECIES (COAGULASE NEGATIVE) THE SIGNIFICANCE OF ISOLATING THIS ORGANISM FROM A SINGLE SET OF BLOOD CULTURES WHEN MULTIPLE SETS ARE DRAWN IS UNCERTAIN. PLEASE NOTIFY THE MICROBIOLOGY DEPARTMENT WITHIN ONE WEEK IF SPECIATION AND SENSITIVITIES ARE REQUIRED.    Report Status 01/29/2015 FINAL  Final  Culture, blood (routine x 2)     Status: None   Collection Time: 01/26/15  9:39 PM  Result Value Ref Range Status   Specimen Description BLOOD LEFT HAND  Final   Special Requests BOTTLES DRAWN AEROBIC AND ANAEROBIC 5CC   Final   Culture NO GROWTH 5 DAYS  Final   Report Status 01/31/2015 FINAL  Final  Urine culture     Status: None   Collection Time: 01/26/15 10:17 PM  Result Value Ref Range Status   Specimen Description URINE, CLEAN CATCH  Final   Special Requests NONE  Final   Culture MULTIPLE SPECIES  PRESENT, SUGGEST RECOLLECTION  Final   Report Status 01/28/2015 FINAL  Final  Culture, blood (routine x 2)     Status: None   Collection Time: 01/28/15  4:23 PM  Result Value Ref Range Status   Specimen Description BLOOD LEFT HAND  Final   Special Requests BOTTLES DRAWN AEROBIC AND ANAEROBIC 5CC  Final   Culture NO GROWTH 5 DAYS  Final   Report Status 02/02/2015 FINAL  Final  Wound culture     Status:  None   Collection Time: 01/29/15  4:30 PM  Result Value Ref Range Status   Specimen Description WOUND BACK  Final   Special Requests ASPIRATION AT L1 L2  Final   Gram Stain   Final    RAM WBC PRESENT,BOTH PMN AND MONONUCLEAR NO SQUAMOUS EPITHELIAL CELLS SEEN NO ORGANISMS SEEN Performed at [facility]    Culture   Final    NO GROWTH 2 DAYS Performed at [facility]    Report Status 02/01/2015 FINAL  Final     Labs: Basic Metabolic Panel:  Recent Labs Lab 01/30/15 0550 01/31/15 1102 02/02/15 0525 02/04/15 0909  NA 139 135 138 134*  K 4.0 4.1 4.1 4.5  CL 107 103 102 99*  CO2 25 24 26 23   GLUCOSE 120* 128* 119* 173*  BUN 10 10 12 16   CREATININE 1.31* 1.14 1.08 1.28*  CALCIUM 9.2 9.3 9.6 9.5   Liver Function Tests: No results for input(s): AST, ALT, ALKPHOS, BILITOT, PROT, ALBUMIN in the last 168 hours. No results for input(s): LIPASE, AMYLASE in the last 168 hours. No results for input(s): AMMONIA in the last 168 hours. CBC:  Recent Labs Lab 02/02/15 0525 02/04/15 0909  WBC 11.8* 13.3*  HGB 10.2* 11.4*  HCT 31.9* 35.6*  MCV 85.3 85.4  PLT 315 370   Cardiac Enzymes: No results for input(s): CKTOTAL, CKMB, CKMBINDEX, TROPONINI in the last 168 hours. BNP: BNP (last 3 results) No results for input(s): BNP in the last 8760 hours.  ProBNP (last 3 results) No results for input(s): PROBNP in the last 8760 hours.  CBG:  Recent Labs Lab 02/03/15 1122 02/03/15 1619 02/03/15 2146 02/04/15 0612 02/04/15 1107  GLUCAP 109* 131* 117* 129* 98        Signed:  Jerrian Mells MD [facility] 02/04/2015, 11:22 AM

## 2015-02-04 NOTE — Care Management Note (Signed)
Case Management Note Marvetta Gibbons RN, BSN Unit 2W-Case Manager [phone]  Patient Details  Name: Kevin Rogers MRN: [id number] Date of Birth: Jul 24, 1931  Subjective/Objective:    Pt admitted with sepsis, unknown source                Action/Plan: PTA pt lived at home - PT/OT evals pending- NCM to follow for recommendations  Expected Discharge Date:                Expected Discharge Plan:  Skilled Nursing Facility  In-House Referral:  Clinical Social Work  Discharge planning Services  CM Consult  Post Acute Care Choice:    Choice offered to:     DME Arranged:    DME Agency:     HH Arranged:    [place] Agency:     Status of Service:  Completed, signed off  Medicare Important Message Given:  Yes-third notification given Date Medicare IM Given:    Medicare IM give by:    Date Additional Medicare IM Given:    Additional Medicare Important Message give by:     If discussed at [place] of Stay Meetings, dates discussed:  02/03/15  Additional Comments:  02/04/15- Marvetta Gibbons RN, BSN - plan for pt to d/c to SNF today- has PICC line placed for IV abx at SNF. CSW following for placement needs.  Dawayne Patricia, RN 02/04/2015, 12:30 PM

## 2015-02-04 NOTE — Progress Notes (Signed)
Peripherally Inserted Central Catheter/Midline Placement  The IV Nurse has discussed with the patient and/or persons authorized to consent for the patient, the purpose of this procedure and the potential benefits and risks involved with this procedure.  The benefits include less needle sticks, lab draws from the catheter and patient may be discharged home with the catheter.  Risks include, but not limited to, infection, bleeding, blood clot (thrombus formation), and puncture of an artery; nerve damage and irregular heat beat.  Alternatives to this procedure were also discussed.  PICC/Midline Placement Documentation  PICC / Midline Single Lumen 97/28/20 PICC Right Basilic 42 cm 1 cm (Active)       Henderson [place] 02/04/2015, 9:29 AM Consent obtained by Colin Ina, RN

## 2015-02-05 ENCOUNTER — Telehealth: Payer: Self-pay | Admitting: *Deleted

## 2015-02-05 NOTE — Telephone Encounter (Signed)
[name], Pharmacist at [facility], called to confirm IV antibiotic/lab orders, also that pharmacy should dose Vancomycin.  RN confirmed orders per chart review, gave contact information for clinic and Dr. Johnnye Sima.  Patient is at [facility]) and has hospital follow up 10/31 with Dr. Johnnye Sima. Phone: [phone] for [facility]. Landis Gandy, RN

## 2015-02-06 DIAGNOSIS — M4626 Osteomyelitis of vertebra, lumbar region: Secondary | ICD-10-CM | POA: Diagnosis not present

## 2015-02-06 DIAGNOSIS — M4646 Discitis, unspecified, lumbar region: Secondary | ICD-10-CM | POA: Diagnosis not present

## 2015-02-06 DIAGNOSIS — N429 Disorder of prostate, unspecified: Secondary | ICD-10-CM | POA: Diagnosis not present

## 2015-02-06 DIAGNOSIS — E119 Type 2 diabetes mellitus without complications: Secondary | ICD-10-CM | POA: Diagnosis not present

## 2015-02-09 ENCOUNTER — Inpatient Hospital Stay (HOSPITAL_COMMUNITY)
Admission: EM | Admit: 2015-02-09 | Discharge: 2015-02-11 | DRG: 372 | Disposition: A | Discharge status: Skilled Nursing Facility | Payer: Medicare Other | Attending: Internal Medicine | Admitting: Internal Medicine

## 2015-02-09 ENCOUNTER — Telehealth: Payer: Self-pay | Admitting: Internal Medicine

## 2015-02-09 ENCOUNTER — Emergency Department (HOSPITAL_COMMUNITY): Payer: Medicare Other

## 2015-02-09 ENCOUNTER — Encounter (HOSPITAL_COMMUNITY): Payer: Self-pay | Admitting: Emergency Medicine

## 2015-02-09 DIAGNOSIS — M869 Osteomyelitis, unspecified: Secondary | ICD-10-CM | POA: Diagnosis present

## 2015-02-09 DIAGNOSIS — M4626 Osteomyelitis of vertebra, lumbar region: Secondary | ICD-10-CM | POA: Diagnosis present

## 2015-02-09 DIAGNOSIS — E785 Hyperlipidemia, unspecified: Secondary | ICD-10-CM | POA: Diagnosis not present

## 2015-02-09 DIAGNOSIS — R6510 Systemic inflammatory response syndrome (SIRS) of non-infectious origin without acute organ dysfunction: Secondary | ICD-10-CM | POA: Diagnosis not present

## 2015-02-09 DIAGNOSIS — Z452 Encounter for adjustment and management of vascular access device: Secondary | ICD-10-CM | POA: Diagnosis not present

## 2015-02-09 DIAGNOSIS — H409 Unspecified glaucoma: Secondary | ICD-10-CM | POA: Diagnosis present

## 2015-02-09 DIAGNOSIS — Z794 Long term (current) use of insulin: Secondary | ICD-10-CM

## 2015-02-09 DIAGNOSIS — E1310 Other specified diabetes mellitus with ketoacidosis without coma: Secondary | ICD-10-CM | POA: Diagnosis not present

## 2015-02-09 DIAGNOSIS — Z79899 Other long term (current) drug therapy: Secondary | ICD-10-CM | POA: Diagnosis not present

## 2015-02-09 DIAGNOSIS — Z981 Arthrodesis status: Secondary | ICD-10-CM

## 2015-02-09 DIAGNOSIS — E119 Type 2 diabetes mellitus without complications: Secondary | ICD-10-CM | POA: Diagnosis not present

## 2015-02-09 DIAGNOSIS — A0472 Enterocolitis due to Clostridium difficile, not specified as recurrent: Secondary | ICD-10-CM | POA: Insufficient documentation

## 2015-02-09 DIAGNOSIS — D638 Anemia in other chronic diseases classified elsewhere: Secondary | ICD-10-CM | POA: Diagnosis present

## 2015-02-09 DIAGNOSIS — C9111 Chronic lymphocytic leukemia of B-cell type in remission: Secondary | ICD-10-CM | POA: Diagnosis not present

## 2015-02-09 DIAGNOSIS — E1122 Type 2 diabetes mellitus with diabetic chronic kidney disease: Secondary | ICD-10-CM | POA: Diagnosis present

## 2015-02-09 DIAGNOSIS — M545 Low back pain: Secondary | ICD-10-CM | POA: Diagnosis not present

## 2015-02-09 DIAGNOSIS — G8929 Other chronic pain: Secondary | ICD-10-CM | POA: Diagnosis present

## 2015-02-09 DIAGNOSIS — N400 Enlarged prostate without lower urinary tract symptoms: Secondary | ICD-10-CM | POA: Diagnosis not present

## 2015-02-09 DIAGNOSIS — B9689 Other specified bacterial agents as the cause of diseases classified elsewhere: Secondary | ICD-10-CM | POA: Diagnosis not present

## 2015-02-09 DIAGNOSIS — A047 Enterocolitis due to Clostridium difficile: Principal | ICD-10-CM | POA: Diagnosis present

## 2015-02-09 DIAGNOSIS — Z959 Presence of cardiac and vascular implant and graft, unspecified: Secondary | ICD-10-CM | POA: Diagnosis not present

## 2015-02-09 DIAGNOSIS — Z792 Long term (current) use of antibiotics: Secondary | ICD-10-CM

## 2015-02-09 DIAGNOSIS — E1142 Type 2 diabetes mellitus with diabetic polyneuropathy: Secondary | ICD-10-CM | POA: Diagnosis not present

## 2015-02-09 DIAGNOSIS — M4646 Discitis, unspecified, lumbar region: Secondary | ICD-10-CM | POA: Diagnosis present

## 2015-02-09 DIAGNOSIS — A498 Other bacterial infections of unspecified site: Secondary | ICD-10-CM | POA: Diagnosis not present

## 2015-02-09 DIAGNOSIS — Z48817 Encounter for surgical aftercare following surgery on the skin and subcutaneous tissue: Secondary | ICD-10-CM | POA: Diagnosis not present

## 2015-02-09 DIAGNOSIS — M1A9XX0 Chronic gout, unspecified, without tophus (tophi): Secondary | ICD-10-CM | POA: Diagnosis not present

## 2015-02-09 DIAGNOSIS — A09 Infectious gastroenteritis and colitis, unspecified: Secondary | ICD-10-CM | POA: Insufficient documentation

## 2015-02-09 DIAGNOSIS — N183 Chronic kidney disease, stage 3 (moderate): Secondary | ICD-10-CM | POA: Diagnosis not present

## 2015-02-09 DIAGNOSIS — R197 Diarrhea, unspecified: Secondary | ICD-10-CM

## 2015-02-09 DIAGNOSIS — M549 Dorsalgia, unspecified: Secondary | ICD-10-CM | POA: Diagnosis present

## 2015-02-09 DIAGNOSIS — I129 Hypertensive chronic kidney disease with stage 1 through stage 4 chronic kidney disease, or unspecified chronic kidney disease: Secondary | ICD-10-CM | POA: Diagnosis not present

## 2015-02-09 DIAGNOSIS — R5081 Fever presenting with conditions classified elsewhere: Secondary | ICD-10-CM | POA: Insufficient documentation

## 2015-02-09 DIAGNOSIS — Z8249 Family history of ischemic heart disease and other diseases of the circulatory system: Secondary | ICD-10-CM | POA: Diagnosis not present

## 2015-02-09 DIAGNOSIS — T814XXA Infection following a procedure, initial encounter: Secondary | ICD-10-CM | POA: Diagnosis not present

## 2015-02-09 DIAGNOSIS — M109 Gout, unspecified: Secondary | ICD-10-CM | POA: Diagnosis not present

## 2015-02-09 DIAGNOSIS — R509 Fever, unspecified: Secondary | ICD-10-CM

## 2015-02-09 DIAGNOSIS — E538 Deficiency of other specified B group vitamins: Secondary | ICD-10-CM | POA: Diagnosis not present

## 2015-02-09 DIAGNOSIS — Z87891 Personal history of nicotine dependence: Secondary | ICD-10-CM | POA: Diagnosis not present

## 2015-02-09 DIAGNOSIS — Z4889 Encounter for other specified surgical aftercare: Secondary | ICD-10-CM | POA: Diagnosis not present

## 2015-02-09 DIAGNOSIS — M8610 Other acute osteomyelitis, unspecified site: Secondary | ICD-10-CM | POA: Diagnosis not present

## 2015-02-09 DIAGNOSIS — M96842 Postprocedural seroma of a musculoskeletal structure following a musculoskeletal system procedure: Secondary | ICD-10-CM | POA: Diagnosis not present

## 2015-02-09 DIAGNOSIS — C9510 Chronic leukemia of unspecified cell type not having achieved remission: Secondary | ICD-10-CM | POA: Diagnosis not present

## 2015-02-09 LAB — CBC
HCT: 32.1 % — ABNORMAL LOW (ref 39.0–52.0)
Hemoglobin: 10.4 g/dL — ABNORMAL LOW (ref 13.0–17.0)
MCH: 27.7 pg (ref 26.0–34.0)
MCHC: 32.4 g/dL (ref 30.0–36.0)
MCV: 85.4 fL (ref 78.0–100.0)
Platelets: 355 10*3/uL (ref 150–400)
RBC: 3.76 MIL/uL — ABNORMAL LOW (ref 4.22–5.81)
RDW: 14.9 % (ref 11.5–15.5)
WBC: 16 10*3/uL — ABNORMAL HIGH (ref 4.0–10.5)

## 2015-02-09 LAB — COMPREHENSIVE METABOLIC PANEL
ALT: 13 U/L — ABNORMAL LOW (ref 17–63)
AST: 18 U/L (ref 15–41)
Albumin: 2.8 g/dL — ABNORMAL LOW (ref 3.5–5.0)
Alkaline Phosphatase: 76 U/L (ref 38–126)
Anion gap: 9 (ref 5–15)
BUN: 14 mg/dL (ref 6–20)
CO2: 26 mmol/L (ref 22–32)
Calcium: 9.2 mg/dL (ref 8.9–10.3)
Chloride: 102 mmol/L (ref 101–111)
Creatinine, Ser: 1.32 mg/dL — ABNORMAL HIGH (ref 0.61–1.24)
GFR calc Af Amer: 57 mL/min — ABNORMAL LOW (ref >60)
GFR calc non Af Amer: 49 mL/min — ABNORMAL LOW (ref >60)
Glucose, Bld: 143 mg/dL — ABNORMAL HIGH (ref 65–99)
Potassium: 4.5 mmol/L (ref 3.5–5.1)
Sodium: 137 mmol/L (ref 135–145)
Total Bilirubin: 0.5 mg/dL (ref 0.3–1.2)
Total Protein: 6.5 g/dL (ref 6.5–8.1)

## 2015-02-09 LAB — URINALYSIS, ROUTINE W REFLEX MICROSCOPIC
Bilirubin Urine: NEGATIVE
Glucose, UA: NEGATIVE mg/dL
Hgb urine dipstick: NEGATIVE
Ketones, ur: NEGATIVE mg/dL
Leukocytes, UA: NEGATIVE
Nitrite: NEGATIVE
Protein, ur: NEGATIVE mg/dL
Specific Gravity, Urine: 1.012 (ref 1.005–1.030)
Urobilinogen, UA: 1 mg/dL (ref 0.0–1.0)
pH: 7 (ref 5.0–8.0)

## 2015-02-09 LAB — GLUCOSE, CAPILLARY
Glucose-Capillary: 126 mg/dL — ABNORMAL HIGH (ref 65–99)
Glucose-Capillary: 233 mg/dL — ABNORMAL HIGH (ref 65–99)

## 2015-02-09 LAB — VANCOMYCIN, TROUGH: Vancomycin Tr: 16 ug/mL (ref 10.0–20.0)

## 2015-02-09 LAB — C DIFFICILE QUICK SCREEN W PCR REFLEX
C Diff antigen: POSITIVE — AB
C Diff interpretation: POSITIVE
C Diff toxin: POSITIVE — AB

## 2015-02-09 LAB — SEDIMENTATION RATE: Sed Rate: 65 mm/hr — ABNORMAL HIGH (ref 0–16)

## 2015-02-09 LAB — I-STAT CG4 LACTIC ACID, ED: Lactic Acid, Venous: 1.42 mmol/L (ref 0.5–2.0)

## 2015-02-09 LAB — C-REACTIVE PROTEIN: CRP: 12.1 mg/dL — ABNORMAL HIGH (ref <1.0)

## 2015-02-09 MED ORDER — SACCHAROMYCES BOULARDII 250 MG PO CAPS
250.0000 mg | ORAL_CAPSULE | Freq: Two times a day (BID) | ORAL | Status: DC
  Administered 2015-02-10 – 2015-02-11 (×4): 250 mg via ORAL
  Filled 2015-02-09 (×5): qty 1

## 2015-02-09 MED ORDER — VITAMIN B-6 100 MG PO TABS
100.0000 mg | ORAL_TABLET | Freq: Every day | ORAL | Status: DC
  Administered 2015-02-10 – 2015-02-11 (×2): 100 mg via ORAL
  Filled 2015-02-09 (×2): qty 1

## 2015-02-09 MED ORDER — VANCOMYCIN HCL IN DEXTROSE 750-5 MG/150ML-% IV SOLN: 750.0000 mg | Freq: Two times a day (BID) | INTRAVENOUS | Status: DC

## 2015-02-09 MED ORDER — TAMSULOSIN HCL 0.4 MG PO CAPS
0.4000 mg | ORAL_CAPSULE | Freq: Every day | ORAL | Status: DC
  Administered 2015-02-10 – 2015-02-11 (×2): 0.4 mg via ORAL
  Filled 2015-02-09 (×3): qty 1

## 2015-02-09 MED ORDER — RIFAMPIN 300 MG PO CAPS
300.0000 mg | ORAL_CAPSULE | Freq: Every day | ORAL | Status: DC
  Administered 2015-02-10 – 2015-02-11 (×2): 300 mg via ORAL
  Filled 2015-02-09 (×3): qty 1

## 2015-02-09 MED ORDER — LATANOPROST 0.005 % OP SOLN
1.0000 [drp] | Freq: Every day | OPHTHALMIC | Status: DC
  Administered 2015-02-09 – 2015-02-10 (×2): 1 [drp] via OPHTHALMIC
  Filled 2015-02-09: qty 2.5

## 2015-02-09 MED ORDER — INSULIN ASPART 100 UNIT/ML ~~LOC~~ SOLN: 0.0000 [IU] | Freq: Three times a day (TID) | SUBCUTANEOUS | Status: DC

## 2015-02-09 MED ORDER — VANCOMYCIN 50 MG/ML ORAL SOLUTION
250.0000 mg | Freq: Four times a day (QID) | ORAL | Status: DC
  Administered 2015-02-09 (×2): 250 mg via ORAL
  Filled 2015-02-09 (×3): qty 5

## 2015-02-09 MED ORDER — OXYCODONE-ACETAMINOPHEN 5-325 MG PO TABS
1.0000 | ORAL_TABLET | ORAL | Status: DC | PRN
  Administered 2015-02-09: 1 via ORAL
  Filled 2015-02-09: qty 1

## 2015-02-09 MED ORDER — SODIUM CHLORIDE 0.9 % IJ SOLN
10.0000 mL | INTRAMUSCULAR | Status: DC | PRN
  Administered 2015-02-10: 10 mL
  Filled 2015-02-09: qty 40

## 2015-02-09 MED ORDER — FENTANYL 50 MCG/HR TD PT72: 50.0000 ug | MEDICATED_PATCH | TRANSDERMAL | Status: DC

## 2015-02-09 MED ORDER — FEBUXOSTAT 40 MG PO TABS
40.0000 mg | ORAL_TABLET | Freq: Every day | ORAL | Status: DC
  Administered 2015-02-10 – 2015-02-11 (×2): 40 mg via ORAL
  Filled 2015-02-09 (×3): qty 1

## 2015-02-09 MED ORDER — OXYCODONE HCL 5 MG PO TABS
5.0000 mg | ORAL_TABLET | ORAL | Status: DC | PRN
  Administered 2015-02-10 – 2015-02-11 (×2): 5 mg via ORAL
  Filled 2015-02-09 (×3): qty 1

## 2015-02-09 MED ORDER — VANCOMYCIN 50 MG/ML ORAL SOLUTION
125.0000 mg | Freq: Four times a day (QID) | ORAL | Status: DC
  Administered 2015-02-10 – 2015-02-11 (×5): 125 mg via ORAL
  Filled 2015-02-09 (×8): qty 2.5

## 2015-02-09 MED ORDER — ACETAMINOPHEN 325 MG PO TABS
650.0000 mg | ORAL_TABLET | Freq: Four times a day (QID) | ORAL | Status: DC | PRN
  Administered 2015-02-09: 650 mg via ORAL
  Filled 2015-02-09: qty 2

## 2015-02-09 MED ORDER — DEXTROSE 5 % IV SOLN
2.0000 g | INTRAVENOUS | Status: DC
  Administered 2015-02-09: 2 g via INTRAVENOUS
  Filled 2015-02-09: qty 2

## 2015-02-09 MED ORDER — VANCOMYCIN HCL IN DEXTROSE 750-5 MG/150ML-% IV SOLN
750.0000 mg | Freq: Two times a day (BID) | INTRAVENOUS | Status: DC
  Administered 2015-02-09 – 2015-02-11 (×4): 750 mg via INTRAVENOUS
  Filled 2015-02-09 (×5): qty 150

## 2015-02-09 NOTE — Progress Notes (Signed)
Patient and family, including wife, son, uncle, educated regarding enteric precautions to r/o Cdiff. Patient and family vocalized understanding of precautions.

## 2015-02-09 NOTE — Progress Notes (Signed)
Pt positive for C.diff. NP notified with new order.

## 2015-02-09 NOTE — ED Provider Notes (Signed)
CSN: [id number]     Arrival date & time 02/09/15  1400 History   First MD Initiated Contact with Patient 02/09/15 1412     Chief Complaint  Patient presents with  . Post-op Problem     (Consider location/radiation/quality/duration/timing/severity/associated sxs/prior Treatment) Patient is a 81 y.o. male presenting with back pain.  Back Pain Location:  Lumbar spine Quality:  Stabbing Radiates to:  Does not radiate Pain severity:  Moderate Pain is:  Same all the time Onset quality:  Gradual Timing:  Constant Progression:  Worsening Chronicity:  Chronic Context comment:  Ho prior    Past Medical History  Diagnosis Date  . Diabetes mellitus type 2 with complications ([place]) 4097  . Hyperlipidemia   . Hypertension   . Leukemia, chronic ([place]) 2001    does not take treatment just has blood levels checked once a year  . Glaucoma     left eye  . Gout   . Acute bronchitis 09/12/2007    Qualifier: Diagnosis of  By: [name] MD, [name]    Past Surgical History  Procedure Laterality Date  . Lumbar laminectomy  1997  . Hernia repair  1989  . Tonsillectomy    . Colonoscopy w/ polypectomy    . Dental surgery      implanted teeth x 2  . Anterior lat lumbar fusion Left 01/03/2013    Procedure: ANTERIOR LATERAL LUMBAR FUSION lumbar three-four;  Surgeon: [name], MD;  Location: [place] [facility];  Service: Neurosurgery;  Laterality: Left;  . Lumbar percutaneous pedicle screw 1 level  01/03/2013    Procedure: LUMBAR PERCUTANEOUS PEDICLE SCREWS LUMBAR THREE-FOUR;  Surgeon: [name], MD;  Location: [facility];  Service: Neurosurgery;;  . Back surgery      lumbar fusion - 2016, feb  . Lumbar wound debridement N/A 07/23/2014    Procedure: LUMBAR WOUND DEBRIDEMENT;  Surgeon: [name], MD;  Location: [place] [facility];  Service: Neurosurgery;  Laterality: N/A;   Family History  Problem Relation Age of Onset  . Heart disease Mother     55, second hand  . Heart disease Father    90, former smoker   Social History  Substance Use Topics  . Smoking status: Former Smoker -- 0.25 packs/day for 20 years    Types: Cigarettes    Quit date: 09/28/1995  . Smokeless tobacco: Never Used  . Alcohol Use: 0.6 oz/week    1 Glasses of wine per week     Comment: monthy    Review of Systems  Musculoskeletal: Positive for back pain.  All other systems reviewed and are negative.     Allergies  Review of patient's allergies indicates no known allergies.  Home Medications   Prior to Admission medications   Medication Sig Start Date End Date Taking? Authorizing Provider  acetaminophen (TYLENOL) 500 MG tablet Take 500-1,000 mg by mouth every 6 (six) hours as needed (pain).   Yes Historical Provider, MD  allopurinol (ZYLOPRIM) 300 MG tablet Take 1 tablet by mouth  daily 12/30/14  Yes [name], MD  fentaNYL (DURAGESIC - DOSED MCG/HR) 50 MCG/HR Place 1 patch (50 mcg total) onto the skin every 3 (three) days. 02/04/15  Yes Eugenie Filler, MD  glipiZIDE (GLUCOTROL XL) 5 MG 24 hr tablet Take 1 tablet (5 mg total) by mouth daily. 07/31/14  Yes [name], MD  insulin aspart (NOVOLOG) 100 UNIT/ML injection Inject 0-10 Units into the skin 3 (three) times daily before meals.  If 60-150=0units, 151-250=2units, 251-300=4units, 301-350=6units, 351-400=8units, 401-450=10units, 451+ call md for further instructions   Yes Historical Provider, MD  nabumetone (RELAFEN) 500 MG tablet Take 500 mg by mouth 2 (two) times daily as needed. Stopped for procedure 01/13/15  Yes Historical Provider, MD  oxyCODONE-acetaminophen (PERCOCET/ROXICET) 5-325 MG tablet Take 1-2 tablets by mouth every 4 (four) hours as needed for moderate pain. 02/04/15  Yes Eugenie Filler, MD  pyridOXINE (VITAMIN B-6) 100 MG tablet Take 100 mg by mouth daily.   Yes Historical Provider, MD  rifampin (RIFADIN) 300 MG capsule Take 1 capsule (300 mg total) by mouth daily. Take for 6 weeks 02/04/15 03/12/15 Yes Eugenie Filler, MD  tamsulosin [facility]) 0.4 MG CAPS capsule Take 1 capsule by mouth  daily 09/02/14  Yes [name], MD  TRAVATAN Z 0.004 % SOLN ophthalmic solution Place 1 drop into both eyes at bedtime.  09/03/14  Yes Historical Provider, MD  Vancomycin (VANCOCIN) 750 MG/150ML SOLN Inject 150 mLs (750 mg total) into the vein every 12 (twelve) hours. Take for 6 weeks 02/04/15 03/12/15 Yes Eugenie Filler, MD  glucose blood [facility] VERIO) test strip Use to test blood sugars daily. Dx: E11.9 08/18/14   [name], MD  ONE TOUCH LANCETS MISC Use to test blood sugars. Dx:E11.9 08/18/14   [name], MD   BP 113/55 mmHg  Pulse 76  Temp(Src) 98.8 F (37.1 C) (Oral)  Resp 20  SpO2 95% Physical Exam  Constitutional: He is oriented to person, place, and time. He appears well-developed and well-nourished.  HENT:  Head: Normocephalic and atraumatic.  Eyes: Conjunctivae and EOM are normal.  Neck: Normal range of motion. Neck supple.  Cardiovascular: Normal rate, regular rhythm and normal heart sounds.   Pulmonary/Chest: Effort normal and breath sounds normal. No respiratory distress.  Abdominal: He exhibits no distension. There is no tenderness. There is no rebound and no guarding.  Musculoskeletal: Normal range of motion.       Lumbar back: He exhibits tenderness and bony tenderness.  Neurological: He is alert and oriented to person, place, and time.  No neuro deficits of legs  Skin: Skin is warm and dry.  Vitals reviewed.   ED Course  Procedures (including critical care time) Labs Review Labs Reviewed  C DIFFICILE QUICK SCREEN W PCR REFLEX - Abnormal; Notable for the following:    C Diff antigen POSITIVE (*)    C Diff toxin POSITIVE (*)    All other components within normal limits  URINALYSIS, ROUTINE W REFLEX MICROSCOPIC (NOT AT [facility]) - Abnormal; Notable for the following:    APPearance CLOUDY (*)    All other components within normal limits  CBC - Abnormal; Notable for  the following:    WBC 16.0 (*)    RBC 3.76 (*)    Hemoglobin 10.4 (*)    HCT 32.1 (*)    All other components within normal limits  COMPREHENSIVE METABOLIC PANEL - Abnormal; Notable for the following:    Glucose, Bld 143 (*)    Creatinine, Ser 1.32 (*)    Albumin 2.8 (*)    ALT 13 (*)    GFR calc non Af Amer 49 (*)    GFR calc Af Amer 57 (*)    All other components within normal limits  SEDIMENTATION RATE - Abnormal; Notable for the following:    Sed Rate 65 (*)    All other components within normal limits  C-REACTIVE PROTEIN - Abnormal; Notable for  the following:    CRP 12.1 (*)    All other components within normal limits  COMPREHENSIVE METABOLIC PANEL - Abnormal; Notable for the following:    Sodium 134 (*)    Chloride 97 (*)    Glucose, Bld 129 (*)    Creatinine, Ser 1.34 (*)    Total Protein 6.1 (*)    Albumin 2.6 (*)    ALT 13 (*)    GFR calc non Af Amer 48 (*)    GFR calc Af Amer 56 (*)    All other components within normal limits  CBC - Abnormal; Notable for the following:    WBC 17.7 (*)    RBC 3.79 (*)    Hemoglobin 10.5 (*)    HCT 32.3 (*)    All other components within normal limits  URIC ACID - Abnormal; Notable for the following:    Uric Acid, Serum 4.2 (*)    All other components within normal limits  GLUCOSE, CAPILLARY - Abnormal; Notable for the following:    Glucose-Capillary 126 (*)    All other components within normal limits  GLUCOSE, CAPILLARY - Abnormal; Notable for the following:    Glucose-Capillary 233 (*)    All other components within normal limits  GLUCOSE, CAPILLARY - Abnormal; Notable for the following:    Glucose-Capillary 130 (*)    All other components within normal limits  URINE CULTURE  CULTURE, BLOOD (ROUTINE X 2)  CULTURE, BLOOD (ROUTINE X 2)  C DIFFICILE QUICK SCREEN W PCR REFLEX  TSH  VANCOMYCIN, TROUGH  I-STAT CG4 LACTIC ACID, ED  I-STAT CG4 LACTIC ACID, ED    Imaging Review Dg Chest 2 View  02/09/2015   CLINICAL  DATA:  Fever.  EXAM: CHEST  2 VIEW  COMPARISON:  01/26/2015 chest radiograph.  FINDINGS: Right PICC terminates in the lower third of the superior vena cava. Stable cardiomediastinal silhouette with normal heart size and mildly tortuous thoracic aorta. No pneumothorax. No pleural effusion. Clear lungs, with no focal lung consolidation and no pulmonary edema. Partially visualized is posterior spinal fusion hardware in the upper lumbar spine.  IMPRESSION: No active disease in the chest.   Electronically Signed   By: Ilona Sorrel M.D.   On: 02/09/2015 15:52   I have personally reviewed and evaluated these images and lab results as part of my medical decision-making.   EKG Interpretation None      MDM   Final diagnoses:  None    81 y.o. male with pertinent PMH of prior laminectomy with complicated post op course of om vs discitis on vanc and rifampin presents with worsening fever and pain over last few days.  Febrile to 103 at home.  Exam as above without obvious skin findings, however pt febrile here.  Spoke with [facility] who recommended admission, he did not recommend immediate imaging.  Admitted to medicine.    I have reviewed all laboratory and imaging studies if ordered as above  No diagnosis found.      Debby Freiberg, MD 02/10/15 [phone]

## 2015-02-09 NOTE — ED Notes (Signed)
Per ems-- pt had back surgery feb 2016 in march he had to have fluid drained from back. Pt was admitted to the hospital last week for infection and had PICC line placed. Pt on vancomycin and Rifampin at home. Today pt presents with fever/chills and severe pain. Temp 103 at home and pt took tylenol at 1152. Pt a&ox4.

## 2015-02-09 NOTE — Progress Notes (Signed)
Patient arrived to 5M13. Patient oriented x4, appropriate cognition, patient states he is in "7/10 stabbing lower back pain," patient to receive percocet as ordered by MD. Patient has tremors which he states are related to pain. Placed on enteric precautions until r/o Cdiff infxn, patient educated and vocalized understanding of meaning and necessity of precautions. SCDs ordered. Admissions MD paged regarding attending. Patient oriented to room, unit,staff. Safety measures in place, will continue to monitor closely.

## 2015-02-09 NOTE — Progress Notes (Addendum)
ANTIBIOTIC CONSULT NOTE - INITIAL  Pharmacy Consult for Ceftriaxone Indication: osteomyelitis  No Known Allergies  Patient Measurements:   Adjusted Body Weight:   Vital Signs: Temp: 101.5 F (38.6 C) (10/10 1736) Temp Source: Oral (10/10 1736) BP: 149/75 mmHg (10/10 1736) Pulse Rate: 95 (10/10 1736) Intake/Output from previous day:   Intake/Output from this shift:    Labs:  Recent Labs  02/09/15 1441  WBC 16.0*  HGB 10.4*  PLT 355  CREATININE 1.32*   Estimated Creatinine Clearance: 56.1 mL/min (by C-G formula based on Cr of 1.32). No results for input(s): VANCOTROUGH, VANCOPEAK, VANCORANDOM, GENTTROUGH, GENTPEAK, GENTRANDOM, TOBRATROUGH, TOBRAPEAK, TOBRARND, AMIKACINPEAK, AMIKACINTROU, AMIKACIN in the last 72 hours.   Microbiology: Recent Results (from the past 720 hour(s))  Culture, blood (routine x 2)     Status: None   Collection Time: 01/26/15  9:28 PM  Result Value Ref Range Status   Specimen Description BLOOD LEFT ANTECUBITAL  Final   Special Requests BOTTLES DRAWN AEROBIC AND ANAEROBIC 5CC   Final   Culture  Setup Time   Final    GRAM POSITIVE COCCI IN CLUSTERS BOTTLES DRAWN AEROBIC ONLY CRITICAL RESULT CALLED TO, READ BACK BY AND VERIFIED WITH: STASCHA @0012  01/28/15 MKELLY    Culture   Final    STAPHYLOCOCCUS SPECIES (COAGULASE NEGATIVE) THE SIGNIFICANCE OF ISOLATING THIS ORGANISM FROM A SINGLE SET OF BLOOD CULTURES WHEN MULTIPLE SETS ARE DRAWN IS UNCERTAIN. PLEASE NOTIFY THE MICROBIOLOGY DEPARTMENT WITHIN ONE WEEK IF SPECIATION AND SENSITIVITIES ARE REQUIRED.    Report Status 01/29/2015 FINAL  Final  Culture, blood (routine x 2)     Status: None   Collection Time: 01/26/15  9:39 PM  Result Value Ref Range Status   Specimen Description BLOOD LEFT HAND  Final   Special Requests BOTTLES DRAWN AEROBIC AND ANAEROBIC 5CC   Final   Culture NO GROWTH 5 DAYS  Final   Report Status 01/31/2015 FINAL  Final  Urine culture     Status: None   Collection Time:  01/26/15 10:17 PM  Result Value Ref Range Status   Specimen Description URINE, CLEAN CATCH  Final   Special Requests NONE  Final   Culture MULTIPLE SPECIES PRESENT, SUGGEST RECOLLECTION  Final   Report Status 01/28/2015 FINAL  Final  Culture, blood (routine x 2)     Status: None   Collection Time: 01/28/15  4:23 PM  Result Value Ref Range Status   Specimen Description BLOOD LEFT HAND  Final   Special Requests BOTTLES DRAWN AEROBIC AND ANAEROBIC 5CC  Final   Culture NO GROWTH 5 DAYS  Final   Report Status 02/02/2015 FINAL  Final  Wound culture     Status: None   Collection Time: 01/29/15  4:30 PM  Result Value Ref Range Status   Specimen Description WOUND BACK  Final   Special Requests ASPIRATION AT L1 L2  Final   Gram Stain   Final    RAM WBC PRESENT,BOTH PMN AND MONONUCLEAR NO SQUAMOUS EPITHELIAL CELLS SEEN NO ORGANISMS SEEN Performed at [facility]    Culture   Final    NO GROWTH 2 DAYS Performed at [facility]    Report Status 02/01/2015 FINAL  Final    Medical History: Past Medical History  Diagnosis Date  . Diabetes mellitus type 2 with complications ([place]) 1610  . Hyperlipidemia   . Hypertension   . Leukemia, chronic ([place]) 2001    does not take treatment just has blood levels checked once  a year  . Glaucoma     left eye  . Gout   . Acute bronchitis 09/12/2007    Qualifier: Diagnosis of  By: [name] MD, [name]     Medications:  Prescriptions prior to admission  Medication Sig Dispense Refill Last Dose  . acetaminophen (TYLENOL) 500 MG tablet Take 500-1,000 mg by mouth every 6 (six) hours as needed (pain).   unknown at unknown  . allopurinol (ZYLOPRIM) 300 MG tablet Take 1 tablet by mouth  daily 90 tablet 2 02/09/2015 at Unknown time  . fentaNYL (DURAGESIC - DOSED MCG/HR) 50 MCG/HR Place 1 patch (50 mcg total) onto the skin every 3 (three) days. 5 patch 0 Past Week at Unknown time  . glipiZIDE (GLUCOTROL XL) 5 MG 24 hr tablet Take 1 tablet (5 mg  total) by mouth daily. 90 tablet 2 02/09/2015 at Unknown time  . insulin aspart (NOVOLOG) 100 UNIT/ML injection Inject 0-10 Units into the skin 3 (three) times daily before meals. If 60-150=0units, 151-250=2units, 251-300=4units, 301-350=6units, 351-400=8units, 401-450=10units, 451+ call md for further instructions   02/09/2015 at Unknown time  . nabumetone (RELAFEN) 500 MG tablet Take 500 mg by mouth 2 (two) times daily as needed. Stopped for procedure  3 Past Week at Unknown time  . oxyCODONE-acetaminophen (PERCOCET/ROXICET) 5-325 MG tablet Take 1-2 tablets by mouth every 4 (four) hours as needed for moderate pain. 30 tablet 0 02/08/2015 at Unknown time  . pyridOXINE (VITAMIN B-6) 100 MG tablet Take 100 mg by mouth daily.   02/09/2015 at Unknown time  . rifampin (RIFADIN) 300 MG capsule Take 1 capsule (300 mg total) by mouth daily. Take for 6 weeks 42 capsule 0 02/09/2015 at Unknown time  . tamsulosin (FLOMAX) 0.4 MG CAPS capsule Take 1 capsule by mouth  daily 90 capsule 1 02/09/2015 at Unknown time  . TRAVATAN Z 0.004 % SOLN ophthalmic solution Place 1 drop into both eyes at bedtime.    02/08/2015 at Unknown time  . Vancomycin (VANCOCIN) 750 MG/150ML SOLN Inject 150 mLs (750 mg total) into the vein every 12 (twelve) hours. Take for 6 weeks 8000 mL 0 02/09/2015 at Unknown time  . glucose blood (ONETOUCH VERIO) test strip Use to test blood sugars daily. Dx: E11.9 100 each 12 Taking  . ONE TOUCH LANCETS MISC Use to test blood sugars. Dx:E11.9 100 each 11 Taking   Scheduled:  . cefTRIAXone (ROCEPHIN)  IV  2 g Intravenous Q24H  . febuxostat  40 mg Oral Daily  . fentaNYL  50 mcg Transdermal Q72H  . [START ON 02/10/2015] insulin aspart  0-10 Units Subcutaneous TID AC  . latanoprost  1 drop Both Eyes QHS  . pyridOXINE  100 mg Oral Daily  . rifampin  300 mg Oral Daily  . tamsulosin  0.4 mg Oral Daily  . vancomycin  250 mg Oral 4 times per day  . Vancomycin  750 mg Intravenous Q12H   Infusions:      Assessment: 81yo male with recent d/c to SNF on vancomycin IV and PO rifampin for osteomyelitis presents with fever. Pharmacy is consulted to dose ceftriaxone for fever. Tmax 101.5, WBC 16.0, sCr 1.32.   Pt was receiving vancomycin 750mg  q12h at SNF with last dose at 0900 10/10. Will check a level tonight.  Goal of Therapy:  Vancomycin trough level 15-20 mcg/ml  Plan:  Ceftriaxone 2g IV q24h Vancomycin 750mg  IV q12h Measure antibiotic drug levels at steady state Follow up culture results, renal function and clinical course  Andrey Cota. Diona Foley, PharmD Clinical Pharmacist Pager [phone] 02/09/2015,5:44 PM  ADDN: VT is 16 mcg/mL on vancomycin 750mg  q12h from SNF. Will continue current dose.  Andrey Cota. Diona Foley, PharmD Clinical Pharmacist Pager [phone]

## 2015-02-09 NOTE — Telephone Encounter (Signed)
I was called the RN caring for Kevin Rogers at the nursing home who have noticed that he has had high fever of 103F without relief from tylenol. picc line looks ok. No complaint of dysuria. Lab from 10/7 shows WBC of 14, higher than discharge from hospital. On vitals having tachycardia. Patient currently on vancomycin plus oral rif for discitis  - recommended that he been seen in teh ED for evaluation of readmission - likely has SIRS criteria, concern for undertreated infection

## 2015-02-09 NOTE — H&P (Signed)
History and Physical  Kevin Rogers EVO:[id number] DOB: 1932/03/05 DOA: 02/09/2015  Referring physician: EDP PCP: Garret Reddish, MD   Chief Complaint: fever   HPI: Kevin Rogers is a 81 y.o. 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 No n/v, no ab pain.  ED course: fever 101.4, bp and heart rate stable, patient is nontoxic looking, cxr unremarkable, cbc elevated at 16, cr 1.32. Blood culture pending, ua pending. EDP talked to neurosurgery recommend no need of repeat imaging to spine, admit to hospitalist service, neurosurgery will see patient tomorrow.    Review of Systems:  Detail per HPI, Review of systems are otherwise negative  Past Medical History  Diagnosis Date  . Diabetes mellitus type 2 with complications (HCC) 1997  . Hyperlipidemia   . Hypertension   . Leukemia, chronic (HCC) 2001    does not take treatment just has blood levels checked once a year  . Glaucoma     left eye  . Gout   . Acute bronchitis 09/12/2007    Qualifier: Diagnosis of  By: Jenkins MD, John E    Past Surgical History  Procedure Laterality Date  . Lumbar laminectomy  1997  . Hernia repair  1989  . Tonsillectomy    . Colonoscopy w/ polypectomy    . Dental surgery      implanted teeth x 2  . Anterior lat lumbar fusion Left 01/03/2013    Procedure: ANTERIOR LATERAL LUMBAR FUSION lumbar three-four;  Surgeon: Randy O Kritzer, MD;  Location: [facility];  Service: Neurosurgery;  Laterality: Left;  . Lumbar percutaneous pedicle screw 1 level  01/03/2013    Procedure: LUMBAR PERCUTANEOUS PEDICLE SCREWS LUMBAR THREE-FOUR;   Surgeon: Randy O Kritzer, MD;  Location: [facility];  Service: Neurosurgery;;  . Back surgery      lumbar fusion - 2016, feb  . Lumbar wound debridement N/A 07/23/2014    Procedure: LUMBAR WOUND DEBRIDEMENT;  Surgeon: Randy Kritzer, MD;  Location: [facility];  Service: Neurosurgery;  Laterality: N/A;   Social History:  reports that he quit smoking about 19 years ago. His smoking use included Cigarettes. He has a 5 pack-year smoking history. He has never used smokeless tobacco. He reports that he drinks about 0.6 oz of alcohol per week. He reports that he does not use illicit drugs. Patient lives at rehab facility & is able to participate in activities of daily living with a walker and assistance at SNF.  No Known Allergies  Family History  Problem Relation Age of Onset  . Heart disease Mother     83, second hand  . Heart disease Father     84 , former smoker      Prior to Admission medications   Medication Sig Start Date End Date Taking? Authorizing Provider  acetaminophen (TYLENOL) 500 MG tablet Take 500-1,000 mg by mouth every 6 (six) hours as needed (pain).   Yes Historical Provider, MD  allopurinol (ZYLOPRIM) 300 MG tablet Take 1 tablet by mouth  daily 12/30/14  Yes [name], MD  fentaNYL (DURAGESIC - DOSED MCG/HR) 50 MCG/HR Place 1 patch (50  mcg total) onto the skin every 3 (three) days. 02/04/15  Yes Eugenie Filler, MD  glipiZIDE (GLUCOTROL XL) 5 MG 24 hr tablet Take 1 tablet (5 mg total) by mouth daily. 07/31/14  Yes [name], MD  insulin aspart (NOVOLOG) 100 UNIT/ML injection Inject 0-10 Units into the skin 3 (three) times daily before meals. If 60-150=0units, 151-250=2units, 251-300=4units, 301-350=6units, 351-400=8units, 401-450=10units, 451+ call md for further instructions   Yes Historical Provider, MD  nabumetone (RELAFEN) 500 MG tablet Take 500 mg by mouth 2 (two) times daily as needed. Stopped for procedure 01/13/15  Yes Historical Provider, MD    oxyCODONE-acetaminophen (PERCOCET/ROXICET) 5-325 MG tablet Take 1-2 tablets by mouth every 4 (four) hours as needed for moderate pain. 02/04/15  Yes Eugenie Filler, MD  pyridOXINE (VITAMIN B-6) 100 MG tablet Take 100 mg by mouth daily.   Yes Historical Provider, MD  rifampin (RIFADIN) 300 MG capsule Take 1 capsule (300 mg total) by mouth daily. Take for 6 weeks 02/04/15 03/12/15 Yes Eugenie Filler, MD  tamsulosin [facility]) 0.4 MG CAPS capsule Take 1 capsule by mouth  daily 09/02/14  Yes [name], MD  TRAVATAN Z 0.004 % SOLN ophthalmic solution Place 1 drop into both eyes at bedtime.  09/03/14  Yes Historical Provider, MD  Vancomycin (VANCOCIN) 750 MG/150ML SOLN Inject 150 mLs (750 mg total) into the vein every 12 (twelve) hours. Take for 6 weeks 02/04/15 03/12/15 Yes Eugenie Filler, MD  glucose blood [facility] VERIO) test strip Use to test blood sugars daily. Dx: E11.9 08/18/14   [name], MD  ONE TOUCH LANCETS MISC Use to test blood sugars. Dx:E11.9 08/18/14   [name], MD    Physical Exam: BP 126/107 mmHg  Pulse 88  Temp(Src) 101.4 F (38.6 C) (Rectal)  Resp 22  SpO2 93%  General:  AAoX3, NAD Eyes: PERRL ENT: unremarkable Neck: supple, no JVD Cardiovascular: RRR Respiratory: CTABL Abdomen: soft/ND/ND, positive bowel sounds Skin: no rash Musculoskeletal:  No edema, c/o back pain when lifting legs. Psychiatric: calm/cooperative Neurologic: no focal findings            Labs on Admission:  Basic Metabolic Panel:  Recent Labs Lab 02/04/15 0909 02/09/15 1441  NA 134* 137  K 4.5 4.5  CL 99* 102  CO2 23 26  GLUCOSE 173* 143*  BUN 16 14  CREATININE 1.28* 1.32*  CALCIUM 9.5 9.2   Liver Function Tests:  Recent Labs Lab 02/09/15 1441  AST 18  ALT 13*  ALKPHOS 76  BILITOT 0.5  PROT 6.5  ALBUMIN 2.8*   No results for input(s): LIPASE, AMYLASE in the last 168 hours. No results for input(s): AMMONIA in the last 168 hours. CBC:  Recent  Labs Lab 02/04/15 0909 02/09/15 1441  WBC 13.3* 16.0*  HGB 11.4* 10.4*  HCT 35.6* 32.1*  MCV 85.4 85.4  PLT 370 355   Cardiac Enzymes: No results for input(s): CKTOTAL, CKMB, CKMBINDEX, TROPONINI in the last 168 hours.  BNP (last 3 results) No results for input(s): BNP in the last 8760 hours.  ProBNP (last 3 results) No results for input(s): PROBNP in the last 8760 hours.  CBG:  Recent Labs Lab 02/03/15 1122 02/03/15 1619 02/03/15 2146 02/04/15 0612 02/04/15 1107  GLUCAP 109* 131* 117* 129* 98    Radiological Exams on Admission: Dg Chest 2 View  02/09/2015   CLINICAL DATA:  Fever.  EXAM: CHEST  2 VIEW  COMPARISON:  01/26/2015 chest radiograph.  FINDINGS: Right PICC terminates in the lower third of the superior vena cava. Stable cardiomediastinal silhouette with normal heart size and mildly tortuous thoracic aorta. No pneumothorax. No pleural effusion. Clear lungs, with no focal lung consolidation and no pulmonary edema. Partially visualized is posterior spinal fusion hardware in the upper lumbar spine.  IMPRESSION: No active disease in the chest.   Electronically Signed   By: Ilona Sorrel M.D.   On: 02/09/2015 15:52    NSR on tele.  Assessment/Plan Present on Admission:  . Back pain  Fever: from osteomyelitis? Has been on vanc and rifampin, b6. will empirically cover g- with rocephin for now. Prn pain meds.  Blood culture drawn from the ED, UA pending, cxr unremarkable. Neurosurgery and ID consulted. keep patient npo after midnight in case of procedure.   Diarrhea: reportedx5 since Sunday, c diff pending, empirically start oral vanc.  Insulin dependent dm2, continue insulin, adjust prn. Hold oral meds.  Elevated cr, mild, ua pending, renal dosing meds.  H/o gout, check uric acid, change allopurinol to uloric in the setting of cr elevation.  H/o CLL: stable, outpatient oncology follow up.  DVT prophylaxis: SCD for now, change to lovenox if no surgery  planned.  Consultants:  Neurosurgery  Infectious disease  Code Status: full   Family Communication:  Patient   Disposition Plan: admit to med surg  Time spent: 68mins  Ricardo Kayes MD, PhD [facility] Pager [phone] If 7PM-7AM, please contact night-coverage at [email or web address], password [facility]

## 2015-02-09 NOTE — ED Notes (Signed)
Notified ED Doctor rectal temperature 101.52F.

## 2015-02-10 ENCOUNTER — Telehealth: Payer: Self-pay | Admitting: Oncology

## 2015-02-10 DIAGNOSIS — R5081 Fever presenting with conditions classified elsewhere: Secondary | ICD-10-CM

## 2015-02-10 DIAGNOSIS — I10 Essential (primary) hypertension: Secondary | ICD-10-CM

## 2015-02-10 DIAGNOSIS — M4626 Osteomyelitis of vertebra, lumbar region: Secondary | ICD-10-CM

## 2015-02-10 DIAGNOSIS — A047 Enterocolitis due to Clostridium difficile: Principal | ICD-10-CM

## 2015-02-10 DIAGNOSIS — R6510 Systemic inflammatory response syndrome (SIRS) of non-infectious origin without acute organ dysfunction: Secondary | ICD-10-CM

## 2015-02-10 DIAGNOSIS — N183 Chronic kidney disease, stage 3 (moderate): Secondary | ICD-10-CM

## 2015-02-10 DIAGNOSIS — M8610 Other acute osteomyelitis, unspecified site: Secondary | ICD-10-CM

## 2015-02-10 DIAGNOSIS — Z959 Presence of cardiac and vascular implant and graft, unspecified: Secondary | ICD-10-CM

## 2015-02-10 LAB — COMPREHENSIVE METABOLIC PANEL
ALT: 13 U/L — ABNORMAL LOW (ref 17–63)
AST: 15 U/L (ref 15–41)
Albumin: 2.6 g/dL — ABNORMAL LOW (ref 3.5–5.0)
Alkaline Phosphatase: 72 U/L (ref 38–126)
Anion gap: 9 (ref 5–15)
BUN: 13 mg/dL (ref 6–20)
CO2: 28 mmol/L (ref 22–32)
Calcium: 8.9 mg/dL (ref 8.9–10.3)
Chloride: 97 mmol/L — ABNORMAL LOW (ref 101–111)
Creatinine, Ser: 1.34 mg/dL — ABNORMAL HIGH (ref 0.61–1.24)
GFR calc Af Amer: 56 mL/min — ABNORMAL LOW (ref >60)
GFR calc non Af Amer: 48 mL/min — ABNORMAL LOW (ref >60)
Glucose, Bld: 129 mg/dL — ABNORMAL HIGH (ref 65–99)
Potassium: 4 mmol/L (ref 3.5–5.1)
Sodium: 134 mmol/L — ABNORMAL LOW (ref 135–145)
Total Bilirubin: 0.7 mg/dL (ref 0.3–1.2)
Total Protein: 6.1 g/dL — ABNORMAL LOW (ref 6.5–8.1)

## 2015-02-10 LAB — CBC
HCT: 32.3 % — ABNORMAL LOW (ref 39.0–52.0)
Hemoglobin: 10.5 g/dL — ABNORMAL LOW (ref 13.0–17.0)
MCH: 27.7 pg (ref 26.0–34.0)
MCHC: 32.5 g/dL (ref 30.0–36.0)
MCV: 85.2 fL (ref 78.0–100.0)
Platelets: 350 10*3/uL (ref 150–400)
RBC: 3.79 MIL/uL — ABNORMAL LOW (ref 4.22–5.81)
RDW: 15 % (ref 11.5–15.5)
WBC: 17.7 10*3/uL — ABNORMAL HIGH (ref 4.0–10.5)

## 2015-02-10 LAB — URINE CULTURE: Culture: NO GROWTH

## 2015-02-10 LAB — GLUCOSE, CAPILLARY
Glucose-Capillary: 130 mg/dL — ABNORMAL HIGH (ref 65–99)
Glucose-Capillary: 144 mg/dL — ABNORMAL HIGH (ref 65–99)
Glucose-Capillary: 115 mg/dL — ABNORMAL HIGH (ref 65–99)
Glucose-Capillary: 133 mg/dL — ABNORMAL HIGH (ref 65–99)

## 2015-02-10 LAB — URIC ACID: Uric Acid, Serum: 4.2 mg/dL — ABNORMAL LOW (ref 4.4–7.6)

## 2015-02-10 LAB — TSH: TSH: 0.618 u[IU]/mL (ref 0.350–4.500)

## 2015-02-10 MED ORDER — INSULIN ASPART 100 UNIT/ML ~~LOC~~ SOLN
0.0000 [IU] | Freq: Three times a day (TID) | SUBCUTANEOUS | Status: DC
Start: 2015-02-10 — End: 2015-02-11
  Administered 2015-02-10: 1 [IU] via SUBCUTANEOUS

## 2015-02-10 MED ORDER — ENOXAPARIN SODIUM 40 MG/0.4ML ~~LOC~~ SOLN
40.0000 mg | SUBCUTANEOUS | Status: DC
  Administered 2015-02-10 – 2015-02-11 (×2): 40 mg via SUBCUTANEOUS
  Filled 2015-02-10 (×2): qty 0.4

## 2015-02-10 NOTE — Progress Notes (Signed)
PATIENT DETAILS Name: Kevin Rogers Age: 81 y.o. Sex: male Date of Birth: 1932-01-04 Admit Date: 02/09/2015 Admitting Physician Florencia Reasons, MD WUJ:[id number] Yong Channel, MD  Brief summary: 81 year old male with a recent history of L1-L2 osteomyelitis recently discharged from hospital to SNF with PICC line-on IV vancomycin/rifampin-readmitted on 10/10 with fever and diarrhea. Found to have positive C. difficile PCR.  Subjective: Back pain essentially unchanged-diarrhea improving.  Assessment/Plan: Active Problems: SIRS due to C. difficile colitis: Start oral vancomycin. Discontinue IV Rocephin. Improving, nontoxic-appearing.Follow clinical course.  L1-L2 discitis/osteomyelitis: Continue IV vancomycin and rifampin. Await ID evaluation. Follow cultures  Essential hypertension: BP currently controlled without the use of any antihypertensives. Follow and resume when able.  BPH: Continue Flomax.  Chronic back pain: Stable on fentanyl transdermally and as needed oxycodone.  History of CLL: Slight worsening in baseline leukocytosis from C. difficile colitis. Follow.  Chronic lower extremity edema: Recent lower extremity Doppler is negative. Only minimal edema evident on exam.  CKD stage III: Creatinine close to her usual baseline. Follow periodically.  Type 2 diabetes: CBGs stable with SSI. Resume glipizide on discharge. Last A1c on 9/35.6.  History of chronic gout: No evidence of flare-continue Uloric.  Anemia: Likely secondary to chronic disease. Stable for outpatient monitoring  Disposition: Remain inpatient-back to SNF in the next 1-2 days  Antimicrobial agents  See below  Anti-infectives    Start     Dose/Rate Route Frequency Ordered Stop   02/10/15 1000  vancomycin (VANCOCIN) 50 mg/mL oral solution 125 mg     125 mg Oral 4 times daily 02/09/15 2336 02/24/15 0959   02/09/15 2100  vancomycin (VANCOCIN) IVPB 750 mg/150 ml premix     750 mg 150 mL/hr over 60  Minutes Intravenous Every 12 hours 02/09/15 1748     02/09/15 1800  vancomycin (VANCOCIN) 50 mg/mL oral solution 250 mg  Status:  Discontinued     250 mg Oral 4 times per day 02/09/15 1738 02/09/15 2342   02/09/15 1800  cefTRIAXone (ROCEPHIN) 2 g in dextrose 5 % 50 mL IVPB  Status:  Discontinued     2 g 100 mL/hr over 30 Minutes Intravenous Every 24 hours 02/09/15 1742 02/10/15 0716   02/09/15 1745  rifampin (RIFADIN) capsule 300 mg     300 mg Oral Daily 02/09/15 1738     02/09/15 1745  vancomycin (VANCOCIN) IVPB 750 mg/150 ml premix  Status:  Discontinued     750 mg 150 mL/hr over 60 Minutes Intravenous Every 12 hours 02/09/15 1738 02/09/15 1748      DVT Prophylaxis: Prophylactic Lovenox   Code Status: Full code   Family Communication None at bedside  Procedures: None  CONSULTS:  ID  Time spent 40 minutes-Greater than 50% of this time was spent in counseling, explanation of diagnosis, planning of further management, and coordination of care.  MEDICATIONS: Scheduled Meds: . febuxostat  40 mg Oral Daily  . fentaNYL  50 mcg Transdermal Q72H  . insulin aspart  0-10 Units Subcutaneous TID AC  . latanoprost  1 drop Both Eyes QHS  . pyridOXINE  100 mg Oral Daily  . rifampin  300 mg Oral Daily  . saccharomyces boulardii  250 mg Oral BID  . tamsulosin  0.4 mg Oral Daily  . vancomycin  125 mg Oral QID  . Vancomycin  750 mg Intravenous Q12H   Continuous Infusions:  PRN Meds:.acetaminophen, oxyCODONE, sodium chloride  PHYSICAL EXAM: Vital signs in last 24 hours: Filed Vitals:   02/10/15 0025 02/10/15 0052 02/10/15 0534 02/10/15 0906  BP:  103/59 108/61 113/55  Pulse:  83 78 76  Temp: 101.3 F (38.5 C) 98.8 F (37.1 C) 99.5 F (37.5 C) 98.8 F (37.1 C)  TempSrc: Oral Oral Oral Oral  Resp:  20 20   SpO2:  92% 95%     Weight change:  There were no vitals filed for this visit. There is no weight on file to calculate BMI.   Gen Exam: Awake and alert with clear  speech.  Neck: Supple, No JVD.   Chest: B/L Clear.   CVS: S1 S2 Regular, no murmurs.  Abdomen: soft, BS +, non tender, non distended.  Extremities: no edema, lower extremities warm to touch. Neurologic: Non Focal.  5/5 in lower extremities. Sensation grossly intact. Skin: No Rash.   Wounds: N/A.   Intake/Output from previous day:  Intake/Output Summary (Last 24 hours) at 02/10/15 0955 Last data filed at 02/10/15 0826  Gross per 24 hour  Intake     10 ml  Output    300 ml  Net   -290 ml     LAB RESULTS: CBC  Recent Labs Lab 02/04/15 0909 02/09/15 1441 02/10/15 0435  WBC 13.3* 16.0* 17.7*  HGB 11.4* 10.4* 10.5*  HCT 35.6* 32.1* 32.3*  PLT 370 355 350  MCV 85.4 85.4 85.2  MCH 27.3 27.7 27.7  MCHC 32.0 32.4 32.5  RDW 15.0 14.9 15.0    Chemistries   Recent Labs Lab 02/04/15 0909 02/09/15 1441 02/10/15 0435  NA 134* 137 134*  K 4.5 4.5 4.0  CL 99* 102 97*  CO2 23 26 28   GLUCOSE 173* 143* 129*  BUN 16 14 13   CREATININE 1.28* 1.32* 1.34*  CALCIUM 9.5 9.2 8.9    CBG:  Recent Labs Lab 02/04/15 0612 02/04/15 1107 02/09/15 1849 02/09/15 2224 02/10/15 0657  GLUCAP 129* 98 126* 233* 130*    GFR CrCl cannot be calculated (Unknown ideal weight.).  Coagulation profile No results for input(s): INR, PROTIME in the last 168 hours.  Cardiac Enzymes No results for input(s): CKMB, TROPONINI, MYOGLOBIN in the last 168 hours.  Invalid input(s): CK  Invalid input(s): POCBNP No results for input(s): DDIMER in the last 72 hours. No results for input(s): HGBA1C in the last 72 hours. No results for input(s): CHOL, HDL, LDLCALC, TRIG, CHOLHDL, LDLDIRECT in the last 72 hours.  Recent Labs  02/10/15 0430  TSH 0.618   No results for input(s): VITAMINB12, FOLATE, FERRITIN, TIBC, IRON, RETICCTPCT in the last 72 hours. No results for input(s): LIPASE, AMYLASE in the last 72 hours.  Urine Studies No results for input(s): UHGB, CRYS in the last 72  hours.  Invalid input(s): UACOL, UAPR, USPG, UPH, UTP, UGL, UKET, UBIL, UNIT, UROB, ULEU, UEPI, UWBC, URBC, UBAC, CAST, UCOM, BILUA  MICROBIOLOGY: Recent Results (from the past 240 hour(s))  Urine culture     Status: None (Preliminary result)   Collection Time: 02/09/15  4:48 PM  Result Value Ref Range Status   Specimen Description URINE, CLEAN CATCH  Final   Special Requests NONE  Final   Culture NO GROWTH < 24 HOURS  Final   Report Status PENDING  Incomplete  C difficile quick screen w PCR reflex     Status: Abnormal   Collection Time: 02/09/15  8:58 PM  Result Value Ref Range Status   C Diff antigen POSITIVE (A) NEGATIVE Final  C Diff toxin POSITIVE (A) NEGATIVE Final   C Diff interpretation Positive for toxigenic C. difficile  Final    Comment: CRITICAL RESULT CALLED TO, READ BACK BY AND VERIFIED WITHTheotis Barrio RN [id number] 02/09/15 A BROWNING     RADIOLOGY STUDIES/RESULTS: Dg Chest 2 View  02/09/2015   CLINICAL DATA:  Fever.  EXAM: CHEST  2 VIEW  COMPARISON:  01/26/2015 chest radiograph.  FINDINGS: Right PICC terminates in the lower third of the superior vena cava. Stable cardiomediastinal silhouette with normal heart size and mildly tortuous thoracic aorta. No pneumothorax. No pleural effusion. Clear lungs, with no focal lung consolidation and no pulmonary edema. Partially visualized is posterior spinal fusion hardware in the upper lumbar spine.  IMPRESSION: No active disease in the chest.   Electronically Signed   By: Ilona Sorrel M.D.   On: 02/09/2015 15:52   Dg Chest 2 View  01/26/2015   CLINICAL DATA:  Back pain tonight with fever.  Recent back surgery  EXAM: CHEST  2 VIEW  COMPARISON:  January 01, 2013  FINDINGS: The heart size and mediastinal contours are within normal limits. The aorta is tortuous. There is no focal infiltrate, pulmonary edema, or pleural effusion. The visualized skeletal structures are unremarkable.  IMPRESSION: No active cardiopulmonary disease.    Electronically Signed   By: Abelardo Diesel M.D.   On: 01/26/2015 22:35   Dg Lumbar Spine Complete  01/27/2015   CLINICAL DATA:  Lower back pain.  Recent back surgery.  EXAM: LUMBAR SPINE - COMPLETE 4+ VIEW  COMPARISON:  09/09/2014  FINDINGS: Recent L1-2 and L2-3 posterior lumbar interbody fusion with rod and pedicle screw fixation. Since comparison radiograph, there has been superior endplate height loss with narrowing between the endplates and pedicle screws. No hardware fracture or displacement. Intervertebral cages are located, including pre-existing cage at the L3-4 level.  Disc and facet degeneration is severe and diffuse, including at T12-L1 where there is chronic endplate erosion.  IMPRESSION: 1. L1 and L2 superior endplate fractures which have developed since 09/09/2014. 2. L1-2 thru L3-4 PLIF with posterior fixation hardware from L1-L3. No hardware displacement.   Electronically Signed   By: Monte Fantasia M.D.   On: 01/27/2015 00:07   Ct Lumbar Spine Wo Contrast  01/27/2015   CLINICAL DATA:  Severe back pain. Fever and sepsis of unclear etiology.  EXAM: CT LUMBAR SPINE WITHOUT CONTRAST  TECHNIQUE: Multidetector CT imaging of the lumbar spine was performed without intravenous contrast administration. Multiplanar CT image reconstructions were also generated.  COMPARISON:  CT myelogram 05/13/2014  FINDINGS: Spinal numbering as previously established.  L1-2 and L2-3 posterior lumbar fusion with rod and pedicle screw fixation. Pre-existing L3-4 PLIF with associated posterior fusion hardware removed since the comparison study.  Wide discectomy space at L1-2 with L2 superior endplate fracture (most convincing on coronal reformats) and excessive subsidence that appears chronic, but has occurred since 09/09/2014 radiography. There has also been progressive subsidence into the L1 inferior endplate. The associated endplates are eroded, with underlying sclerosis, even greater than expected based on extensive  preoperative subchondral irregularity. Wide lucency around the L1 and L3 pedicle screws, consistent with loosening. No graft displacement.  No L1 superior endplate fracture as suspected on prior radiography. T12-L1 disc narrowing and subchondral erosive changes similar to preoperative myelogram.  Perispinal edematous change, best visualized at the level of T12-L1, but likely also at the L1-L2 level - increasing concern for postoperative infection.  No gross canal or incisional fluid collection.  No progressive degenerative stenosis compared to CT myelogram comparison.  IMPRESSION: 1. Progressive widening of the L1-2 discectomy site with chronic appearing L2 superior endplate fracture and excessive subsidence. Appearance and history concerning for postoperative discitis, MRI with contrast recommended. 2. Rapid loosening of L1 and L3 pedicle screws. 3. No L1 superior endplate fracture as questioned on previous radiography. 4. No acute finding at the L2-3 and L3-4 discectomy sites. No solid bony fusion.   Electronically Signed   By: Monte Fantasia M.D.   On: 01/27/2015 02:45   Mr Lumbar Spine W Wo Contrast  01/27/2015   CLINICAL DATA:  Back surgery in February 2016 for adjacent segment disease at L1-L2. Subsequent re-exploration 07/23/2014 for seroma. Now with severe back pain and fever. Elevated white count. History of diabetes mellitus.  EXAM: MRI LUMBAR SPINE WITHOUT AND WITH CONTRAST  TECHNIQUE: Multiplanar and multiecho pulse sequences of the lumbar spine were obtained without and with intravenous contrast.  CONTRAST:  92mL MULTIHANCE GADOBENATE DIMEGLUMINE 529 MG/ML IV SOLN  COMPARISON:  CT myelogram from 05/13/2014 is correlated. Also prior MRI from 01/01/2014. CT lumbar spine without contrast 01/27/2015.  FINDINGS: Segmentation: 5 lumbar vertebrae.  Alignment:  Trace anterolisthesis L3-4.  Vertebrae: Recent (2/16) surgery for L1 through L3 decompression, with posterior and interbody fusion using interbody  cages and pedicle screw and rod construct. Abnormal vertebral body edema and enhancement above and below the L1-2 interbody cage. In conjunction with the CT appearance of widening of the interspace and endplate erosion, concern for L1-2 diskitis and regional osteomyelitis.  Conus medullaris: Normal in size, signal, and location.  Paraspinal tissues: No evidence for hydronephrosis or paravertebral mass. There is is edema of the RIGHT greater than LEFT psoas muscle, adjacent to the L1-2 interspace, although no abscess is observed.Well-healed posterior midline incision.  Disc levels:  L1-L2: Interbody cages float in the interspace, surrounded by T2 hyperintense and enhancing material consistent with diskitis. No cage migration, but shallow ventral epidural mass effect is present, not resulting in significant stenosis. Regional edema of the L1 and L2 vertebrae. Unclear if L2 nerve root displacement is present. BILATERAL foraminal narrowing could affect either L1 nerve root.  L2-L3: Developing arthrodesis. No concerning features. No impingement.  L3-L4: Prior XLIF. No definite solid arthrodesis, but no concerning features for infection. Wide posterior decompression. No residual impingement.  L4-L5: Advanced disc space narrowing. Central disc osteophyte complex. Adequate posterior decompression. No impingement.  L5-S1: Central disc osteophyte complex. Facet arthropathy. No impingement.  Compared with the preoperative MR, the stenosis at L1-2 is improved.  IMPRESSION: When correlated with the recent CT, features are concerning for L1-2 diskitis and osteomyelitis. If there is no organism identified from blood cultures, L1-2 disc space aspiration could be performed.  Shallow ventral soft tissue abnormality at L1-2, without significant stenosis or concern for frank epidural abscess. Regional RIGHT psoas edema is observed.   Electronically Signed   By: Staci Righter M.D.   On: 01/27/2015 19:24   Ct Image Guided Drainage By  Percutaneous Catheter  01/29/2015   CLINICAL DATA:  81 year old with back pain and concern for diskitis at L1-L2.  EXAM: CT GUIDED ASPIRATION OF L1-L2 DISC SPACE  ANESTHESIA/SEDATION: 1 mg Versed, 50 mcg fentanyl. A radiology nurse monitored the patient for moderate sedation.  PROCEDURE: Informed consent was obtained for a CT-guided aspiration. The patient was placed prone. CT images through the lumbar spine were obtained. The right flank was prepped with Betadine and a sterile field was created. [place]  needle was directed to the lateral aspect of the L1-L2 disc space. Approximately 1 cc of bloody fluid was aspirated after multiple needle manipulations. Specimens sent for culture. Bandage placed over the puncture site.  COMPLICATIONS: No immediate complication  FINDINGS: Needle directed into the right lateral aspect of the L1-L2 disc space. Small amount of bloody fluid was aspirated.  Estimated blood loss: Minimal  IMPRESSION: CT-guided aspiration of L1-L2 disc space.   Electronically Signed   By: Markus Daft M.D.   On: 01/29/2015 16:33    Oren Binet, MD  [facility] Pager:[phone]  If 7PM-7AM, please contact night-coverage [email or web address] Password [email or web address] 02/10/2015, 9:55 AM   LOS: 1 day

## 2015-02-10 NOTE — Consult Note (Signed)
[facility]    Total days of antibiotics: 16        Day 9/26-9/28 zosyn        Day 9/26->> vancomycin IV        Day 9/28->> rifampin        Day 10/10->> vancomycin PO       Reason for Consult: Fever, leukocytosis in pt on treatment for diskitis/osteomyelitis    Referring Physician: Florencia Reasons  Active Problems:   Back pain   Osteomyelitis ([place])     HPI: Kevin Rogers is a 81 y.o. 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 Notably he was discharged on 10/5 to SNF for rehab where he continued vancomycin IV and rifampin PO for his diskitis. His back pain has been present since last admission but was slightly worsened for the past 2 days. He had fevers up to 102.4 o/n with an increased WBC count to 17.7. C.Diff. PCR and toxin were positive and treatment started with PO vancomycin 10/10. His fever since improved and he is feeling better today from yesterday.  Past Medical History  Diagnosis Date  . Diabetes mellitus type 2 with complications ([place]) 0973  . Hyperlipidemia   . Hypertension   . Leukemia, chronic ([place]) 2001    does not take treatment just has blood levels checked once a year  . Glaucoma     left eye  . Gout   . Acute bronchitis 09/12/2007    Qualifier: Diagnosis of  By: [name] MD, [name]     Allergies: No Known Allergies  Current antibiotics:   MEDICATIONS: . enoxaparin (LOVENOX) injection  40 mg Subcutaneous Q24H  . febuxostat  40 mg Oral Daily  . fentaNYL  50 mcg Transdermal Q72H  . insulin aspart  0-9 Units Subcutaneous TID WC  . latanoprost  1 drop Both Eyes QHS  . pyridOXINE  100 mg Oral Daily  . rifampin  300 mg Oral Daily  .  saccharomyces boulardii  250 mg Oral BID  . tamsulosin  0.4 mg Oral Daily  . vancomycin  125 mg Oral QID  . Vancomycin  750 mg Intravenous Q12H    Social History  Substance Use Topics  . Smoking status: Former Smoker -- 0.25 packs/day for 20 years    Types: Cigarettes    Quit date: 09/28/1995  . Smokeless tobacco: Never Used  . Alcohol Use: 0.6 oz/week    1 Glasses of wine per week     Comment: monthy    Family History  Problem Relation Age of Onset  . Heart disease Mother     73, second hand  . Heart disease Father     10, former smoker    Review of Systems  Constitutional: Positive for fever.  Eyes: Negative for blurred vision.  Respiratory: Negative for cough.   Cardiovascular: Negative for chest pain and leg swelling.  Gastrointestinal: Positive for abdominal pain and diarrhea. Negative for blood in stool.  Genitourinary: Negative for dysuria.  Musculoskeletal: Positive for back pain.  Skin: Negative for rash.  Neurological: Positive for weakness. Negative for dizziness and headaches.      OBJECTIVE: Temp:  [98.6 F (37 C)-102.4 F (39.1  C)] 98.6 F (37 C) (10/11 1058) Pulse Rate:  [72-95] 72 (10/11 1029) Resp:  [20-23] 20 (10/11 1029) BP: (103-149)/(55-107) 111/60 mmHg (10/11 1029) SpO2:  [92 %-95 %] 94 % (10/11 1029)  GENERAL- alert, co-operative, NAD HEENT- Atraumatic, oral mucosa appears moist, no cervical lymphadenopathy CARDIAC- RRR, no murmurs, rubs or gallops. RESP- CTAB, no wheezes or crackles. ABDOMEN- Soft, nontender, no guarding or rebound, mildly hypoactive bowel sounds throughout BACK- no CVA tenderness, vertical surgical scar over lumbar spine is well healed, paraspinal tenderness is noted bilaterally around L3-S1 on palpation, without induration or erythema NEURO- No obvious Cr N abnormality, strength upper and lower extremities- 5/5 EXTREMITIES- symmetric, no pedal edema. SKIN- Warm, dry, No rash or lesion. PSYCH- Normal mood and affect,  appropriate thought content and speech.   LABS: Results for orders placed or performed during the hospital encounter of 02/09/15 (from the past 48 hour(s))  Culture, blood (routine x 2)     Status: None (Preliminary result)   Collection Time: 02/09/15  2:41 PM  Result Value Ref Range   Specimen Description BLOOD RIGHT PICC LINE    Special Requests BOTTLES DRAWN AEROBIC AND ANAEROBIC 5ML    Culture NO GROWTH < 24 HOURS    Report Status PENDING   CBC     Status: Abnormal   Collection Time: 02/09/15  2:41 PM  Result Value Ref Range   WBC 16.0 (H) 4.0 - 10.5 K/uL   RBC 3.76 (L) 4.22 - 5.81 MIL/uL   Hemoglobin 10.4 (L) 13.0 - 17.0 g/dL   HCT 32.1 (L) 39.0 - 52.0 %   MCV 85.4 78.0 - 100.0 fL   MCH 27.7 26.0 - 34.0 pg   MCHC 32.4 30.0 - 36.0 g/dL   RDW 14.9 11.5 - 15.5 %   Platelets 355 150 - 400 K/uL  Comprehensive metabolic panel     Status: Abnormal   Collection Time: 02/09/15  2:41 PM  Result Value Ref Range   Sodium 137 135 - 145 mmol/L   Potassium 4.5 3.5 - 5.1 mmol/L   Chloride 102 101 - 111 mmol/L   CO2 26 22 - 32 mmol/L   Glucose, Bld 143 (H) 65 - 99 mg/dL   BUN 14 6 - 20 mg/dL   Creatinine, Ser 1.32 (H) 0.61 - 1.24 mg/dL   Calcium 9.2 8.9 - 10.3 mg/dL   Total Protein 6.5 6.5 - 8.1 g/dL   Albumin 2.8 (L) 3.5 - 5.0 g/dL   AST 18 15 - 41 U/L   ALT 13 (L) 17 - 63 U/L   Alkaline Phosphatase 76 38 - 126 U/L   Total Bilirubin 0.5 0.3 - 1.2 mg/dL   GFR calc non Af Amer 49 (L) >60 mL/min   GFR calc Af Amer 57 (L) >60 mL/min    Comment: (NOTE) The eGFR has been calculated using the CKD EPI equation. This calculation has not been validated in all clinical situations. eGFR's persistently <60 mL/min signify possible Chronic Kidney Disease.    Anion gap 9 5 - 15  Sedimentation rate     Status: Abnormal   Collection Time: 02/09/15  2:41 PM  Result Value Ref Range   Sed Rate 65 (H) 0 - 16 mm/hr  C-reactive protein     Status: Abnormal   Collection Time: 02/09/15  2:41 PM    Result Value Ref Range   CRP 12.1 (H) <1.0 mg/dL  I-Stat CG4 Lactic Acid, ED  (not at [facility])     Status:  None   Collection Time: 02/09/15  2:55 PM  Result Value Ref Range   Lactic Acid, Venous 1.42 0.5 - 2.0 mmol/L  Culture, blood (routine x 2)     Status: None (Preliminary result)   Collection Time: 02/09/15  3:29 PM  Result Value Ref Range   Specimen Description BLOOD LEFT WRIST    Special Requests BOTTLES DRAWN AEROBIC ONLY [place]    Culture NO GROWTH < 24 HOURS    Report Status PENDING   Urinalysis, Routine w reflex microscopic (not at [facility])     Status: Abnormal   Collection Time: 02/09/15  4:47 PM  Result Value Ref Range   Color, Urine YELLOW YELLOW   APPearance CLOUDY (A) CLEAR   Specific Gravity, Urine 1.012 1.005 - 1.030   pH 7.0 5.0 - 8.0   Glucose, UA NEGATIVE NEGATIVE mg/dL   Hgb urine dipstick NEGATIVE NEGATIVE   Bilirubin Urine NEGATIVE NEGATIVE   Ketones, ur NEGATIVE NEGATIVE mg/dL   Protein, ur NEGATIVE NEGATIVE mg/dL   Urobilinogen, UA 1.0 0.0 - 1.0 mg/dL   Nitrite NEGATIVE NEGATIVE   Leukocytes, UA NEGATIVE NEGATIVE    Comment: MICROSCOPIC NOT DONE ON URINES WITH NEGATIVE PROTEIN, BLOOD, LEUKOCYTES, NITRITE, OR GLUCOSE <1000 mg/dL.  Urine culture     Status: None (Preliminary result)   Collection Time: 02/09/15  4:48 PM  Result Value Ref Range   Specimen Description URINE, CLEAN CATCH    Special Requests NONE    Culture NO GROWTH < 24 HOURS    Report Status PENDING   Glucose, capillary     Status: Abnormal   Collection Time: 02/09/15  6:49 PM  Result Value Ref Range   Glucose-Capillary 126 (H) 65 - 99 mg/dL  Vancomycin, trough     Status: None   Collection Time: 02/09/15  7:52 PM  Result Value Ref Range   Vancomycin Tr 16 10.0 - 20.0 ug/mL  C difficile quick screen w PCR reflex     Status: Abnormal   Collection Time: 02/09/15  8:58 PM  Result Value Ref Range   C Diff antigen POSITIVE (A) NEGATIVE   C Diff toxin POSITIVE (A) NEGATIVE   C Diff  interpretation Positive for toxigenic C. difficile     Comment: CRITICAL RESULT CALLED TO, READ BACK BY AND VERIFIED WITHTheotis Barrio RN [id number] 02/09/15 A BROWNING   Glucose, capillary     Status: Abnormal   Collection Time: 02/09/15 10:24 PM  Result Value Ref Range   Glucose-Capillary 233 (H) 65 - 99 mg/dL   Comment 1 Notify RN    Comment 2 Document in Chart   TSH     Status: None   Collection Time: 02/10/15  4:30 AM  Result Value Ref Range   TSH 0.618 0.350 - 4.500 uIU/mL  Comprehensive metabolic panel     Status: Abnormal   Collection Time: 02/10/15  4:35 AM  Result Value Ref Range   Sodium 134 (L) 135 - 145 mmol/L   Potassium 4.0 3.5 - 5.1 mmol/L   Chloride 97 (L) 101 - 111 mmol/L   CO2 28 22 - 32 mmol/L   Glucose, Bld 129 (H) 65 - 99 mg/dL   BUN 13 6 - 20 mg/dL   Creatinine, Ser 1.34 (H) 0.61 - 1.24 mg/dL   Calcium 8.9 8.9 - 10.3 mg/dL   Total Protein 6.1 (L) 6.5 - 8.1 g/dL   Albumin 2.6 (L) 3.5 - 5.0 g/dL   AST 15 15 - 41 U/L  ALT 13 (L) 17 - 63 U/L   Alkaline Phosphatase 72 38 - 126 U/L   Total Bilirubin 0.7 0.3 - 1.2 mg/dL   GFR calc non Af Amer 48 (L) >60 mL/min   GFR calc Af Amer 56 (L) >60 mL/min    Comment: (NOTE) The eGFR has been calculated using the CKD EPI equation. This calculation has not been validated in all clinical situations. eGFR's persistently <60 mL/min signify possible Chronic Kidney Disease.    Anion gap 9 5 - 15  CBC     Status: Abnormal   Collection Time: 02/10/15  4:35 AM  Result Value Ref Range   WBC 17.7 (H) 4.0 - 10.5 K/uL   RBC 3.79 (L) 4.22 - 5.81 MIL/uL   Hemoglobin 10.5 (L) 13.0 - 17.0 g/dL   HCT 32.3 (L) 39.0 - 52.0 %   MCV 85.2 78.0 - 100.0 fL   MCH 27.7 26.0 - 34.0 pg   MCHC 32.5 30.0 - 36.0 g/dL   RDW 15.0 11.5 - 15.5 %   Platelets 350 150 - 400 K/uL  Uric acid     Status: Abnormal   Collection Time: 02/10/15  4:35 AM  Result Value Ref Range   Uric Acid, Serum 4.2 (L) 4.4 - 7.6 mg/dL  Glucose, capillary     Status: Abnormal    Collection Time: 02/10/15  6:57 AM  Result Value Ref Range   Glucose-Capillary 130 (H) 65 - 99 mg/dL   Comment 1 Notify RN    Comment 2 Document in Chart   Glucose, capillary     Status: Abnormal   Collection Time: 02/10/15 11:32 AM  Result Value Ref Range   Glucose-Capillary 144 (H) 65 - 99 mg/dL    MICRO:  IMAGING: Dg Chest 2 View  02/09/2015   CLINICAL DATA:  Fever.  EXAM: CHEST  2 VIEW  COMPARISON:  01/26/2015 chest radiograph.  FINDINGS: Right PICC terminates in the lower third of the superior vena cava. Stable cardiomediastinal silhouette with normal heart size and mildly tortuous thoracic aorta. No pneumothorax. No pleural effusion. Clear lungs, with no focal lung consolidation and no pulmonary edema. Partially visualized is posterior spinal fusion hardware in the upper lumbar spine.  IMPRESSION: No active disease in the chest.   Electronically Signed   By: Ilona Sorrel M.D.   On: 02/09/2015 15:52    HISTORICAL MICRO/IMAGING  Assessment/Plan:  Clostridium difficile colitis Patient does appear to have D. Diff. Infection with correseponding symptoms of fever, leukocytosis, and loose nonbloody diarrhea 3+ times daily. Today is day 16 of continuous antibiotics including 3 days of Zosyn then vancomycin, rifampin during the course. Positive PCR and toxin for C. Diff. -Agree with primary team, continue PO vancomycin -Pt needs at least 2 wk treatment course, but also has moderate-high risk of relapse if stopped at that time due to his planned Rx for osteo(6wks total), so he needs to be reassessed at that time  Discitis with involved surrounding bone osteomyelitis His back pain is reportedly much better than yesterday, the original vancomycin/rifampin course was never interrupted. Physical exam also not compelling for active site infection. He is more tender in paraspinal areas around L3-S1, inferior to known infection site. Currently suggesting against failure of treatment at 2 weeks  out for his diskitis/osteo. PICC line has no surrounding inflammation. -Continue vancomycin IV/rifampin PO    Hinton Lovely Internal Medicine Resident PGY-I Pager: [phone]

## 2015-02-10 NOTE — Clinical Social Work Note (Signed)
Clinical Social Work Assessment  Patient Details  Name: Kevin Rogers MRN: [id number] Date of Birth: 09/08/1931  Date of referral:  02/10/15               Reason for consult:     Return back to [facility]             Housing/Transportation Living arrangements for the past 2 months:  Single Family Home Source of Information:  Patient Patient Interpreter Needed:  None Criminal Activity/Legal Involvement Pertinent to Current Situation/Hospitalization:  No - Comment as needed Significant Relationships:  Significant Other Lives with:  Self Do you feel safe going back to the place where you live?  No Need for family participation in patient care:  No (Coment)  Care giving concerns: N/A   Facilities manager / plan: CSW met with the pt at the bedside. CSW introduced self and purpose of the visit. CSW discussed SNF rehab. Pt shared that he received rehab from [facility] prior to being admitted. Pt shared that he would like to return back to [place]. CSW explained the SNF process. CSW answered all questions in which the pt inquired about. CSW will continue to follow this pt and assist with discharge as needed.   Employment status:  Disabled (Comment on whether or not currently receiving Disability) Insurance information:  Managed Medicare PT Recommendations:  Not assessed at this time Information / Referral to community resources:   (N/A)  Patient/Family's Response to care:  Pt reported that the care in which he has received as been well.   Patient/Family's Understanding of and Emotional Response to Diagnosis, Current Treatment, and Prognosis: Pt acknowledged receiving back surgery during his last admission. Pt reported not understanding how he developed an inflection. Pt remain positive about his experiences and shared that he will heal in no time to return back home.   Emotional Assessment Appearance:  Appears stated age Attitude/Demeanor/Rapport:   (Pleasant ) Affect  (typically observed):  Accepting Orientation:  Oriented to Self, Oriented to Place, Oriented to  Time, Oriented to Situation Alcohol / Substance use:  Not Applicable Psych involvement (Current and /or in the community):  No (Comment)  Discharge Needs  Concerns to be addressed:  Denies Needs/Concerns at this time Readmission within the last 30 days:  No Current discharge risk:  None Barriers to Discharge:  No Barriers Identified   Kaheem Halleck, LCSW 02/10/2015, 11:12 AM

## 2015-02-10 NOTE — Telephone Encounter (Signed)
Returned patient call re r/s 10/13 appointments due to being in the hosp. Left message for patient informing him that next available is not until 11/25 and asked if he wanted to keep 10/13 for now just in case he goes home. Patient asked to call me back re keeping 10/13 or r/s to 11/25.

## 2015-02-10 NOTE — Progress Notes (Signed)
Patient ID: Kevin Rogers, male   DOB: [birth year], 81 y.o.   MRN: [id number] Subjective: Patient reports feeling better today  Objective: Vital signs in last 24 hours: Temp:  [98.6 F (37 C)-102.4 F (39.1 C)] 99.6 F (37.6 C) (10/11 1817) Pulse Rate:  [70-83] 78 (10/11 1817) Resp:  [20] 20 (10/11 1817) BP: (103-113)/(55-64) 109/56 mmHg (10/11 1817) SpO2:  [92 %-96 %] 96 % (10/11 1817) Weight:  [102 kg (224 lb 13.9 oz)] 102 kg (224 lb 13.9 oz) (10/11 1600)  Intake/Output from previous day: 10/10 0701 - 10/11 0700 In: 10 [I.V.:10] Out: -  Intake/Output this shift:    awake, alert; no new neuro issues  Lab Results:  Recent Labs  02/09/15 1441 02/10/15 0435  WBC 16.0* 17.7*  HGB 10.4* 10.5*  HCT 32.1* 32.3*  PLT 355 350   BMET  Recent Labs  02/09/15 1441 02/10/15 0435  NA 137 134*  K 4.5 4.0  CL 102 97*  CO2 26 28  GLUCOSE 143* 129*  BUN 14 13  CREATININE 1.32* 1.34*  CALCIUM 9.2 8.9    Studies/Results: Dg Chest 2 View  02/09/2015   CLINICAL DATA:  Fever.  EXAM: CHEST  2 VIEW  COMPARISON:  01/26/2015 chest radiograph.  FINDINGS: Right PICC terminates in the lower third of the superior vena cava. Stable cardiomediastinal silhouette with normal heart size and mildly tortuous thoracic aorta. No pneumothorax. No pleural effusion. Clear lungs, with no focal lung consolidation and no pulmonary edema. Partially visualized is posterior spinal fusion hardware in the upper lumbar spine.  IMPRESSION: No active disease in the chest.   Electronically Signed   By: Ilona Sorrel M.D.   On: 02/09/2015 15:52    Assessment/Plan: Situation well known to me. He is on antibiotics for osteo. He was admitted for increased temp. He says that the feeling is that he may have gotten a GI bug. I would be surprised if his lumbar issue progressed with him taking the strong medications. He can go when [facility] thinks he is ready. I will need to see him in office in a few weeks.   LOS: 1 day   as above   [name], MD 02/10/2015, 7:01 PM

## 2015-02-10 NOTE — Telephone Encounter (Signed)
Patient called back and r/s 10/13 appointments to 11/25. Patient has new date/time.

## 2015-02-11 DIAGNOSIS — N400 Enlarged prostate without lower urinary tract symptoms: Secondary | ICD-10-CM | POA: Diagnosis not present

## 2015-02-11 DIAGNOSIS — M8610 Other acute osteomyelitis, unspecified site: Secondary | ICD-10-CM | POA: Diagnosis not present

## 2015-02-11 DIAGNOSIS — Z981 Arthrodesis status: Secondary | ICD-10-CM

## 2015-02-11 DIAGNOSIS — E538 Deficiency of other specified B group vitamins: Secondary | ICD-10-CM | POA: Diagnosis not present

## 2015-02-11 DIAGNOSIS — M96842 Postprocedural seroma of a musculoskeletal structure following a musculoskeletal system procedure: Secondary | ICD-10-CM | POA: Diagnosis not present

## 2015-02-11 DIAGNOSIS — H409 Unspecified glaucoma: Secondary | ICD-10-CM | POA: Diagnosis not present

## 2015-02-11 DIAGNOSIS — E1310 Other specified diabetes mellitus with ketoacidosis without coma: Secondary | ICD-10-CM

## 2015-02-11 DIAGNOSIS — Z4889 Encounter for other specified surgical aftercare: Secondary | ICD-10-CM | POA: Diagnosis not present

## 2015-02-11 DIAGNOSIS — E119 Type 2 diabetes mellitus without complications: Secondary | ICD-10-CM | POA: Diagnosis not present

## 2015-02-11 DIAGNOSIS — Z452 Encounter for adjustment and management of vascular access device: Secondary | ICD-10-CM | POA: Diagnosis not present

## 2015-02-11 DIAGNOSIS — Z48817 Encounter for surgical aftercare following surgery on the skin and subcutaneous tissue: Secondary | ICD-10-CM | POA: Diagnosis not present

## 2015-02-11 DIAGNOSIS — R6510 Systemic inflammatory response syndrome (SIRS) of non-infectious origin without acute organ dysfunction: Secondary | ICD-10-CM | POA: Diagnosis not present

## 2015-02-11 DIAGNOSIS — C9510 Chronic leukemia of unspecified cell type not having achieved remission: Secondary | ICD-10-CM | POA: Diagnosis not present

## 2015-02-11 DIAGNOSIS — B9689 Other specified bacterial agents as the cause of diseases classified elsewhere: Secondary | ICD-10-CM | POA: Diagnosis not present

## 2015-02-11 DIAGNOSIS — M549 Dorsalgia, unspecified: Secondary | ICD-10-CM | POA: Diagnosis not present

## 2015-02-11 DIAGNOSIS — I129 Hypertensive chronic kidney disease with stage 1 through stage 4 chronic kidney disease, or unspecified chronic kidney disease: Secondary | ICD-10-CM | POA: Diagnosis not present

## 2015-02-11 DIAGNOSIS — M4626 Osteomyelitis of vertebra, lumbar region: Secondary | ICD-10-CM | POA: Diagnosis not present

## 2015-02-11 DIAGNOSIS — E1122 Type 2 diabetes mellitus with diabetic chronic kidney disease: Secondary | ICD-10-CM | POA: Diagnosis not present

## 2015-02-11 DIAGNOSIS — N183 Chronic kidney disease, stage 3 (moderate): Secondary | ICD-10-CM | POA: Diagnosis not present

## 2015-02-11 DIAGNOSIS — M109 Gout, unspecified: Secondary | ICD-10-CM | POA: Diagnosis not present

## 2015-02-11 DIAGNOSIS — T814XXA Infection following a procedure, initial encounter: Secondary | ICD-10-CM

## 2015-02-11 DIAGNOSIS — M4646 Discitis, unspecified, lumbar region: Secondary | ICD-10-CM | POA: Diagnosis not present

## 2015-02-11 DIAGNOSIS — E1142 Type 2 diabetes mellitus with diabetic polyneuropathy: Secondary | ICD-10-CM | POA: Diagnosis not present

## 2015-02-11 DIAGNOSIS — A047 Enterocolitis due to Clostridium difficile: Secondary | ICD-10-CM | POA: Diagnosis not present

## 2015-02-11 LAB — BASIC METABOLIC PANEL
Anion gap: 8 (ref 5–15)
BUN: 12 mg/dL (ref 6–20)
CO2: 28 mmol/L (ref 22–32)
Calcium: 9.2 mg/dL (ref 8.9–10.3)
Chloride: 100 mmol/L — ABNORMAL LOW (ref 101–111)
Creatinine, Ser: 1.21 mg/dL (ref 0.61–1.24)
GFR calc Af Amer: >60 mL/min (ref >60)
GFR calc non Af Amer: 55 mL/min — ABNORMAL LOW (ref >60)
Glucose, Bld: 148 mg/dL — ABNORMAL HIGH (ref 65–99)
Potassium: 4 mmol/L (ref 3.5–5.1)
Sodium: 136 mmol/L (ref 135–145)

## 2015-02-11 LAB — CBC
HCT: 32.6 % — ABNORMAL LOW (ref 39.0–52.0)
Hemoglobin: 10.6 g/dL — ABNORMAL LOW (ref 13.0–17.0)
MCH: 27.6 pg (ref 26.0–34.0)
MCHC: 32.5 g/dL (ref 30.0–36.0)
MCV: 84.9 fL (ref 78.0–100.0)
Platelets: 320 10*3/uL (ref 150–400)
RBC: 3.84 MIL/uL — ABNORMAL LOW (ref 4.22–5.81)
RDW: 14.8 % (ref 11.5–15.5)
WBC: 15.7 10*3/uL — ABNORMAL HIGH (ref 4.0–10.5)

## 2015-02-11 LAB — GLUCOSE, CAPILLARY: Glucose-Capillary: 120 mg/dL — ABNORMAL HIGH (ref 65–99)

## 2015-02-11 MED ORDER — FENTANYL 50 MCG/HR TD PT72: 50.0000 ug | MEDICATED_PATCH | TRANSDERMAL | Status: DC

## 2015-02-11 MED ORDER — VANCOMYCIN 50 MG/ML ORAL SOLUTION: 125.0000 mg | Freq: Four times a day (QID) | ORAL | Status: DC

## 2015-02-11 MED ORDER — OXYCODONE-ACETAMINOPHEN 5-325 MG PO TABS: 1.0000 | ORAL_TABLET | ORAL | Status: DC | PRN

## 2015-02-11 MED ORDER — VANCOMYCIN HCL IN DEXTROSE 750-5 MG/150ML-% IV SOLN: 750.0000 mg | Freq: Two times a day (BID) | INTRAVENOUS | Status: DC

## 2015-02-11 MED ORDER — RIFAMPIN 300 MG PO CAPS: 300.0000 mg | ORAL_CAPSULE | Freq: Every day | ORAL | Status: DC

## 2015-02-11 NOTE — Progress Notes (Signed)
Pt discharged to [facility], skilled nursing facility. Discharge instructions in packet for facility. PICC line remains in place in R arm. Pt left unit via [facility] at 1150am. Attempted to call report to [facility], no answer after being on hold X 10 mins. Wendee Copp

## 2015-02-11 NOTE — Clinical Social Work Note (Signed)
CSW met pt at bedside. CSW introduce self and purpose of visit. CSW informed the pt that she will be discharge back to [facility] today. CSW and pt discussed ambulance transport. Pt will notify his family. CSW contact [facility] at [phone] to schedule transport for the pt. CSW informed Marden Noble at [facility] regarding the pt return. CSW upload the pt's discharge summary. Bedside RN provided number to call reported.   [place], MSW, [place]

## 2015-02-11 NOTE — Discharge Summary (Signed)
PATIENT DETAILS Name: Kevin Rogers Age: 81 y.o. Sex: male Date of Birth: 1931-05-25 MRN: [id number]. Admitting Physician: Florencia Reasons, MD RSW:NIOEVOJ Yong Channel, MD  Admit Date: 02/09/2015 Discharge date: 02/11/2015  Recommendations for Outpatient Follow-up:  1. Continue oral vancomycin for 2 weeks from 02/10/15. If diarrhea worsens or relapses-please call infectious disease clinic  2. Please ensure patient follows up at the infectious disease clinic on 10/31-please see below.  3. Patient will need weekly CBC with differential,CMP, CRP, ESR and vancomycin trough level-these results will need to be faxed to [phone]. 4. Last day of intravenous vancomycin and rifampin 03/12/15-but must be seen by infectious disease prior to stopping antibiotics   PRIMARY DISCHARGE DIAGNOSIS:  Active Problems:   Back pain   Osteomyelitis (HCC)   C. difficile colitis      PAST MEDICAL HISTORY: Past Medical History  Diagnosis Date  . Diabetes mellitus type 2 with complications ([place]) 9371  . Hyperlipidemia   . Hypertension   . Leukemia, chronic ([place]) 2001    does not take treatment just has blood levels checked once a year  . Glaucoma     left eye  . Gout   . Acute bronchitis 09/12/2007    Qualifier: Diagnosis of  By: [name] MD, [name]     DISCHARGE MEDICATIONS: Current Discharge Medication List    START taking these medications   Details  vancomycin (VANCOCIN) 50 mg/mL oral solution Take 2.5 mLs (125 mg total) by mouth 4 (four) times daily. 14 days from 02/10/15      CONTINUE these medications which have CHANGED   Details  fentaNYL (DURAGESIC - DOSED MCG/HR) 50 MCG/HR Place 1 patch (50 mcg total) onto the skin every 3 (three) days. Qty: 3 patch, Refills: 0    oxyCODONE-acetaminophen (PERCOCET/ROXICET) 5-325 MG tablet Take 1-2 tablets by mouth every 4 (four) hours as needed for moderate pain. Qty: 20 tablet, Refills: 0    rifampin (RIFADIN) 300 MG capsule Take 1 capsule (300 mg  total) by mouth daily. Take for 6 weeks (last dose on 03/12/15)-before discontinuing antibiotics will need follow-up at the infectious disease clinic    Vancomycin (VANCOCIN) 750 MG/150ML SOLN Inject 150 mLs (750 mg total) into the vein every 12 (twelve) hours. Take for 6 weeks (last dose on 03/12/15)-before discontinuing antibiotics will need follow-up at the infectious disease clinic      CONTINUE these medications which have NOT CHANGED   Details  acetaminophen (TYLENOL) 500 MG tablet Take 500-1,000 mg by mouth every 6 (six) hours as needed (pain).    allopurinol (ZYLOPRIM) 300 MG tablet Take 1 tablet by mouth  daily Qty: 90 tablet, Refills: 2    glipiZIDE (GLUCOTROL XL) 5 MG 24 hr tablet Take 1 tablet (5 mg total) by mouth daily. Qty: 90 tablet, Refills: 2    insulin aspart (NOVOLOG) 100 UNIT/ML injection Inject 0-10 Units into the skin 3 (three) times daily before meals. If 60-150=0units, 151-250=2units, 251-300=4units, 301-350=6units, 351-400=8units, 401-450=10units, 451+ call md for further instructions    nabumetone (RELAFEN) 500 MG tablet Take 500 mg by mouth 2 (two) times daily as needed. Stopped for procedure Refills: 3    pyridOXINE (VITAMIN B-6) 100 MG tablet Take 100 mg by mouth daily.    tamsulosin (FLOMAX) 0.4 MG CAPS capsule Take 1 capsule by mouth  daily Qty: 90 capsule, Refills: 1    TRAVATAN Z 0.004 % SOLN ophthalmic solution Place 1 drop into both eyes at bedtime.     glucose blood (ONETOUCH  VERIO) test strip Use to test blood sugars daily. Dx: E11.9 Qty: 100 each, Refills: 12    ONE TOUCH LANCETS MISC Use to test blood sugars. Dx:E11.9 Qty: 100 each, Refills: 11        ALLERGIES:  No Known Allergies  BRIEF HPI:  See H&P, Labs, Consult and Test reports for all details in brief, 81 year old male with a recent history of L1-L2 osteomyelitis recently discharged from hospital to SNF with PICC line-on IV vancomycin/rifampin-readmitted on 10/10 with fever and  diarrhea. Found to have positive C. difficile PCR.  CONSULTATIONS:   ID   Neurosurgery  PERTINENT RADIOLOGIC STUDIES: Dg Chest 2 View  02/09/2015  CLINICAL DATA:  Fever. EXAM: CHEST  2 VIEW COMPARISON:  01/26/2015 chest radiograph. FINDINGS: Right PICC terminates in the lower third of the superior vena cava. Stable cardiomediastinal silhouette with normal heart size and mildly tortuous thoracic aorta. No pneumothorax. No pleural effusion. Clear lungs, with no focal lung consolidation and no pulmonary edema. Partially visualized is posterior spinal fusion hardware in the upper lumbar spine. IMPRESSION: No active disease in the chest. Electronically Signed   By: Ilona Sorrel M.D.   On: 02/09/2015 15:52   Dg Chest 2 View  01/26/2015  CLINICAL DATA:  Back pain tonight with fever.  Recent back surgery EXAM: CHEST  2 VIEW COMPARISON:  January 01, 2013 FINDINGS: The heart size and mediastinal contours are within normal limits. The aorta is tortuous. There is no focal infiltrate, pulmonary edema, or pleural effusion. The visualized skeletal structures are unremarkable. IMPRESSION: No active cardiopulmonary disease. Electronically Signed   By: Abelardo Diesel M.D.   On: 01/26/2015 22:35   Dg Lumbar Spine Complete  01/27/2015  CLINICAL DATA:  Lower back pain.  Recent back surgery. EXAM: LUMBAR SPINE - COMPLETE 4+ VIEW COMPARISON:  09/09/2014 FINDINGS: Recent L1-2 and L2-3 posterior lumbar interbody fusion with rod and pedicle screw fixation. Since comparison radiograph, there has been superior endplate height loss with narrowing between the endplates and pedicle screws. No hardware fracture or displacement. Intervertebral cages are located, including pre-existing cage at the L3-4 level. Disc and facet degeneration is severe and diffuse, including at T12-L1 where there is chronic endplate erosion. IMPRESSION: 1. L1 and L2 superior endplate fractures which have developed since 09/09/2014. 2. L1-2 thru L3-4 PLIF  with posterior fixation hardware from L1-L3. No hardware displacement. Electronically Signed   By: Monte Fantasia M.D.   On: 01/27/2015 00:07   Ct Lumbar Spine Wo Contrast  01/27/2015  CLINICAL DATA:  Severe back pain. Fever and sepsis of unclear etiology. EXAM: CT LUMBAR SPINE WITHOUT CONTRAST TECHNIQUE: Multidetector CT imaging of the lumbar spine was performed without intravenous contrast administration. Multiplanar CT image reconstructions were also generated. COMPARISON:  CT myelogram 05/13/2014 FINDINGS: Spinal numbering as previously established. L1-2 and L2-3 posterior lumbar fusion with rod and pedicle screw fixation. Pre-existing L3-4 PLIF with associated posterior fusion hardware removed since the comparison study. Wide discectomy space at L1-2 with L2 superior endplate fracture (most convincing on coronal reformats) and excessive subsidence that appears chronic, but has occurred since 09/09/2014 radiography. There has also been progressive subsidence into the L1 inferior endplate. The associated endplates are eroded, with underlying sclerosis, even greater than expected based on extensive preoperative subchondral irregularity. Wide lucency around the L1 and L3 pedicle screws, consistent with loosening. No graft displacement. No L1 superior endplate fracture as suspected on prior radiography. T12-L1 disc narrowing and subchondral erosive changes similar to preoperative myelogram. Perispinal edematous  change, best visualized at the level of T12-L1, but likely also at the L1-L2 level - increasing concern for postoperative infection. No gross canal or incisional fluid collection. No progressive degenerative stenosis compared to CT myelogram comparison. IMPRESSION: 1. Progressive widening of the L1-2 discectomy site with chronic appearing L2 superior endplate fracture and excessive subsidence. Appearance and history concerning for postoperative discitis, MRI with contrast recommended. 2. Rapid loosening  of L1 and L3 pedicle screws. 3. No L1 superior endplate fracture as questioned on previous radiography. 4. No acute finding at the L2-3 and L3-4 discectomy sites. No solid bony fusion. Electronically Signed   By: Monte Fantasia M.D.   On: 01/27/2015 02:45   Mr Lumbar Spine W Wo Contrast  01/27/2015  CLINICAL DATA:  Back surgery in February 2016 for adjacent segment disease at L1-L2. Subsequent re-exploration 07/23/2014 for seroma. Now with severe back pain and fever. Elevated white count. History of diabetes mellitus. EXAM: MRI LUMBAR SPINE WITHOUT AND WITH CONTRAST TECHNIQUE: Multiplanar and multiecho pulse sequences of the lumbar spine were obtained without and with intravenous contrast. CONTRAST:  51m MULTIHANCE GADOBENATE DIMEGLUMINE 529 MG/ML IV SOLN COMPARISON:  CT myelogram from 05/13/2014 is correlated. Also prior MRI from 01/01/2014. CT lumbar spine without contrast 01/27/2015. FINDINGS: Segmentation: 5 lumbar vertebrae. Alignment:  Trace anterolisthesis L3-4. Vertebrae: Recent (2/16) surgery for L1 through L3 decompression, with posterior and interbody fusion using interbody cages and pedicle screw and rod construct. Abnormal vertebral body edema and enhancement above and below the L1-2 interbody cage. In conjunction with the CT appearance of widening of the interspace and endplate erosion, concern for L1-2 diskitis and regional osteomyelitis. Conus medullaris: Normal in size, signal, and location. Paraspinal tissues: No evidence for hydronephrosis or paravertebral mass. There is is edema of the RIGHT greater than LEFT psoas muscle, adjacent to the L1-2 interspace, although no abscess is observed.Well-healed posterior midline incision. Disc levels: L1-L2: Interbody cages float in the interspace, surrounded by T2 hyperintense and enhancing material consistent with diskitis. No cage migration, but shallow ventral epidural mass effect is present, not resulting in significant stenosis. Regional edema of  the L1 and L2 vertebrae. Unclear if L2 nerve root displacement is present. BILATERAL foraminal narrowing could affect either L1 nerve root. L2-L3: Developing arthrodesis. No concerning features. No impingement. L3-L4: Prior XLIF. No definite solid arthrodesis, but no concerning features for infection. Wide posterior decompression. No residual impingement. L4-L5: Advanced disc space narrowing. Central disc osteophyte complex. Adequate posterior decompression. No impingement. L5-S1: Central disc osteophyte complex. Facet arthropathy. No impingement. Compared with the preoperative MR, the stenosis at L1-2 is improved. IMPRESSION: When correlated with the recent CT, features are concerning for L1-2 diskitis and osteomyelitis. If there is no organism identified from blood cultures, L1-2 disc space aspiration could be performed. Shallow ventral soft tissue abnormality at L1-2, without significant stenosis or concern for frank epidural abscess. Regional RIGHT psoas edema is observed. Electronically Signed   By: JStaci RighterM.D.   On: 01/27/2015 19:24   Ct Image Guided Drainage By Percutaneous Catheter  01/29/2015  CLINICAL DATA:  81year old with back pain and concern for diskitis at L1-L2. EXAM: CT GUIDED ASPIRATION OF L1-L2 DISC SPACE ANESTHESIA/SEDATION: 1 mg Versed, 50 mcg fentanyl. A radiology nurse monitored the patient for moderate sedation. PROCEDURE: Informed consent was obtained for a CT-guided aspiration. The patient was placed prone. CT images through the lumbar spine were obtained. The right flank was prepped with Betadine and a sterile field was created. 2[place]  needle was directed to the lateral aspect of the L1-L2 disc space. Approximately 1 cc of bloody fluid was aspirated after multiple needle manipulations. Specimens sent for culture. Bandage placed over the puncture site. COMPLICATIONS: No immediate complication FINDINGS: Needle directed into the right lateral aspect of the L1-L2 disc space.  Small amount of bloody fluid was aspirated. Estimated blood loss: Minimal IMPRESSION: CT-guided aspiration of L1-L2 disc space. Electronically Signed   By: Markus Daft M.D.   On: 01/29/2015 16:33     PERTINENT LAB RESULTS: CBC:  Recent Labs  02/10/15 0435 02/11/15 0905  WBC 17.7* 15.7*  HGB 10.5* 10.6*  HCT 32.3* 32.6*  PLT 350 320   CMET CMP     Component Value Date/Time   NA 134* 02/10/2015 0435   NA 141 02/12/2014 0844   K 4.0 02/10/2015 0435   K 4.6 02/12/2014 0844   CL 97* 02/10/2015 0435   CL 109* 02/17/2012 1346   CO2 28 02/10/2015 0435   CO2 22 02/12/2014 0844   GLUCOSE 129* 02/10/2015 0435   GLUCOSE 205* 02/12/2014 0844   GLUCOSE 108* 02/17/2012 1346   BUN 13 02/10/2015 0435   BUN 19.5 02/12/2014 0844   CREATININE 1.34* 02/10/2015 0435   CREATININE 1.6* 02/12/2014 0844   CALCIUM 8.9 02/10/2015 0435   CALCIUM 10.0 02/12/2014 0844   PROT 6.1* 02/10/2015 0435   PROT 6.8 02/12/2014 0844   ALBUMIN 2.6* 02/10/2015 0435   ALBUMIN 3.8 02/12/2014 0844   AST 15 02/10/2015 0435   AST 20 02/12/2014 0844   ALT 13* 02/10/2015 0435   ALT 20 02/12/2014 0844   ALKPHOS 72 02/10/2015 0435   ALKPHOS 82 02/12/2014 0844   BILITOT 0.7 02/10/2015 0435   BILITOT 0.99 02/12/2014 0844   GFRNONAA 48* 02/10/2015 0435   GFRAA 56* 02/10/2015 0435    GFR Estimated Creatinine Clearance: 54.4 mL/min (by C-G formula based on Cr of 1.34). No results for input(s): LIPASE, AMYLASE in the last 72 hours. No results for input(s): CKTOTAL, CKMB, CKMBINDEX, TROPONINI in the last 72 hours. Invalid input(s): POCBNP No results for input(s): DDIMER in the last 72 hours. No results for input(s): HGBA1C in the last 72 hours. No results for input(s): CHOL, HDL, LDLCALC, TRIG, CHOLHDL, LDLDIRECT in the last 72 hours.  Recent Labs  02/10/15 0430  TSH 0.618   No results for input(s): VITAMINB12, FOLATE, FERRITIN, TIBC, IRON, RETICCTPCT in the last 72 hours. Coags: No results for input(s): INR  in the last 72 hours.  Invalid input(s): PT Microbiology: Recent Results (from the past 240 hour(s))  Culture, blood (routine x 2)     Status: None (Preliminary result)   Collection Time: 02/09/15  2:41 PM  Result Value Ref Range Status   Specimen Description BLOOD RIGHT PICC LINE  Final   Special Requests BOTTLES DRAWN AEROBIC AND ANAEROBIC 5ML  Final   Culture NO GROWTH < 24 HOURS  Final   Report Status PENDING  Incomplete  Culture, blood (routine x 2)     Status: None (Preliminary result)   Collection Time: 02/09/15  3:29 PM  Result Value Ref Range Status   Specimen Description BLOOD LEFT WRIST  Final   Special Requests BOTTLES DRAWN AEROBIC ONLY [place]  Final   Culture NO GROWTH < 24 HOURS  Final   Report Status PENDING  Incomplete  Urine culture     Status: None   Collection Time: 02/09/15  4:48 PM  Result Value Ref Range Status   Specimen  Description URINE, CLEAN CATCH  Final   Special Requests NONE  Final   Culture NO GROWTH 1 DAY  Final   Report Status 02/10/2015 FINAL  Final  C difficile quick screen w PCR reflex     Status: Abnormal   Collection Time: 02/09/15  8:58 PM  Result Value Ref Range Status   C Diff antigen POSITIVE (A) NEGATIVE Final   C Diff toxin POSITIVE (A) NEGATIVE Final   C Diff interpretation Positive for toxigenic C. difficile  Final    Comment: CRITICAL RESULT CALLED TO, READ BACK BY AND VERIFIED WITHTheotis Barrio RN [id number] 02/09/15 A BROWNING      BRIEF HOSPITAL COURSE:  SIRS due to C. difficile colitis: Rapidly improved, no fever or diarrhea by the time of discharge. In fact no diarrhea since yesterday-well over 24 hours. Will continue with oral vancomycin-spoke with infectious disease-recommendations are to continue for a total of 2 weeks and then discontinue. If diarrhea relapses or worsens, please contact ID clinic if patient still at SNF   L1-L2 discitis/osteomyelitis: Continue IV vancomycin and rifampin-last day of IV vancomycin and oral rifampin  03/12/15. Will need to be seen by infectious disease before discontinuing antibiotics. Needs weekly labs faxed twice a day clinic-please see above..  Essential hypertension: BP currently controlled without the use of any antihypertensives. Follow and resume when able.  BPH: Continue Flomax.  Chronic back pain: Stable on fentanyl transdermally and as needed Percocet  History of CLL: Chronic leukocytosis-very close to her usual baseline at the time of discharge. Afebrile, nontoxic appearing.   Chronic lower extremity edema: Recent lower extremity Doppler is negative. Only minimal edema evident on exam.  CKD stage III: Creatinine close to her usual baseline. Follow periodically.  Type 2 diabetes: CBGs stable with SSI. Resume glipizide on discharge. Last A1c on 9/35.6.  History of chronic gout: No evidence of flare-continue Uloric.  Anemia: Likely secondary to chronic disease. Stable for outpatient monitoring  TODAY-DAY OF DISCHARGE:  Subjective:   Bryen Hinderman today has no headache,no chest abdominal pain,no new weakness tingling or numbness, feels much better wants to go home today.   Objective:   Blood pressure 138/64, pulse 65, temperature 97.5 F (36.4 C), temperature source Oral, resp. rate 20, height 6' (1.829 m), weight 102 kg (224 lb 13.9 oz), SpO2 98 %. No intake or output data in the 24 hours ending 02/11/15 0952 Filed Weights   02/10/15 1600  Weight: 102 kg (224 lb 13.9 oz)    Exam Awake Alert, Oriented *3, No new F.N deficits, Normal affect .AT,PERRAL Supple Neck,No JVD, No cervical lymphadenopathy appriciated.  Symmetrical Chest wall movement, Good air movement bilaterally, CTAB RRR,No Gallops,Rubs or new Murmurs, No Parasternal Heave +ve B.Sounds, Abd Soft, Non tender, No organomegaly appriciated, No rebound -guarding or rigidity. No Cyanosis, Clubbing or edema, No new Rash or bruise  DISCHARGE CONDITION: Stable  DISPOSITION: SNF  DISCHARGE  INSTRUCTIONS:    Activity:  As tolerated   Get Medicines reviewed and adjusted: Please take all your medications with you for your next visit with your Primary MD  Please request your Primary MD to go over all hospital tests and procedure/radiological results at the follow up, please ask your Primary MD to get all Hospital records sent to his/her office.  If you experience worsening of your admission symptoms, develop shortness of breath, life threatening emergency, suicidal or homicidal thoughts you must seek medical attention immediately by calling 911 or calling your MD immediately  if  symptoms less severe.  You must read complete instructions/literature along with all the possible adverse reactions/side effects for all the Medicines you take and that have been prescribed to you. Take any new Medicines after you have completely understood and accpet all the possible adverse reactions/side effects.   Do not drive when taking Pain medications.   Do not take more than prescribed Pain, Sleep and Anxiety Medications  Special Instructions: If you have smoked or chewed Tobacco  in the last 2 yrs please stop smoking, stop any regular Alcohol  and or any Recreational drug use.  Wear Seat belts while driving.  Please note  You were cared for by a hospitalist during your hospital stay. Once you are discharged, your primary care physician will handle any further medical issues. Please note that NO REFILLS for any discharge medications will be authorized once you are discharged, as it is imperative that you return to your primary care physician (or establish a relationship with a primary care physician if you do not have one) for your aftercare needs so that they can reassess your need for medications and monitor your lab values.   Diet recommendation: Diabetic Diet Heart Healthy diet  Discharge Instructions    Call MD for:  persistant nausea and vomiting    Complete by:  As directed      Call  MD for:    Complete by:  As directed   Worsening diarrhea     Diet - low sodium heart healthy    Complete by:  As directed      Increase activity slowly    Complete by:  As directed            Follow-up Information    Follow up with Garret Reddish, MD.   Specialty:  Family Medicine   Contact information:   [address] [place] [address] [phone]       Follow up with Bobby Rumpf, MD On 03/02/2015.   Specialty:  Infectious Diseases   Why:  AT 10:45 AM   Contact information:   [place] [address] [phone]       Total Time spent on discharge equals 45 minutes.  SignedOren Binet 02/11/2015 9:52 AM

## 2015-02-11 NOTE — Evaluation (Signed)
Physical Therapy Evaluation Patient Details Name: Gari Hartsell MRN: [id number] DOB: 1931/06/18 Today's Date: 02/11/2015   History of Present Illness  Patient with recent hospitalization due to fever and back pain with discitis and osteomyelitis went to SNF rehab returns due to lower back pain, fever and positive C-Diff infection.  Clinical Impression  Patient presents with decreased mobility due to deficits listed in PT problem list.  He will benefit from skilled PT in the acute setting to maximize mobility, decrease burden of care at next venue and to ensure return to home independent following SNF rehab stay.    Follow Up Recommendations SNF    Equipment Recommendations  None recommended by PT    Recommendations for Other Services       Precautions / Restrictions Precautions Precautions: Fall;Back Restrictions Weight Bearing Restrictions: No      Mobility  Bed Mobility Overal bed mobility: Needs Assistance   Rolling: Supervision Sidelying to sit: Min guard       General bed mobility comments: assist with trunk to come upright use of railing and increased time  Transfers Overall transfer level: Needs assistance Equipment used: Rolling walker (2 wheeled) Transfers: Sit to/from Stand Sit to Stand: Mod assist         General transfer comment: lifting assist from bed at normal height  Ambulation/Gait Ambulation/Gait assistance: Min guard Ambulation Distance (Feet): 200 Feet Assistive device: Rolling walker (2 wheeled) Gait Pattern/deviations: Step-through pattern;Decreased stride length;Wide base of support;Antalgic;Shuffle;Trunk flexed Gait velocity: Decreased   General Gait Details: flexed with ambulation with walker too short, but patient refues adjustment  Stairs            Wheelchair Mobility    Modified Rankin (Stroke Patients Only)       Balance Overall balance assessment: Needs assistance   Sitting balance-Leahy Scale: Good      Standing balance support: Bilateral upper extremity supported Standing balance-Leahy Scale: Poor Standing balance comment: relies on UE support for balance with pain using UE's to splint                             Pertinent Vitals/Pain Pain Score: 7  Pain Location: low back with mobility Pain Descriptors / Indicators: Aching Pain Intervention(s): Repositioned;Monitored during session;Limited activity within patient's tolerance    Home Living Family/patient expects to be discharged to:: Skilled nursing facility     Type of Home: House Home Access: Stairs to enter Entrance Stairs-Rails: Right Entrance Stairs-Number of Steps: 4 Home Layout: One level Home Equipment: Walker - 2 wheels;Kasandra Knudsen - single point Additional Comments: information from previous admission from [facility] in [place] and planned return later today    Prior Function Level of Independence: Needs assistance               Hand Dominance   Dominant Hand: Right    Extremity/Trunk Assessment               Lower Extremity Assessment: Generalized weakness         Communication   Communication: No difficulties  Cognition Arousal/Alertness: Awake/alert Behavior During Therapy: WFL for tasks assessed/performed Overall Cognitive Status: Within Functional Limits for tasks assessed                      General Comments      Exercises        Assessment/Plan    PT Assessment Patient needs continued PT services  PT Diagnosis Difficulty walking;Acute pain   PT Problem List Decreased strength;Decreased knowledge of use of DME;Decreased activity tolerance;Decreased safety awareness;Decreased balance;Decreased mobility;Decreased knowledge of precautions  PT Treatment Interventions DME instruction;Balance training;Stair training;Neuromuscular re-education;Gait training;Functional mobility training;Patient/family education;Therapeutic activities;Therapeutic  exercise   PT Goals (Current goals can be found in the Care Plan section) Acute Rehab PT Goals Patient Stated Goal: To get stronger and go home when completes rehab PT Goal Formulation: With patient Time For Goal Achievement: 02/18/15 Potential to Achieve Goals: Good    Frequency Min 3X/week   Barriers to discharge        Co-evaluation               End of Session Equipment Utilized During Treatment: Gait belt Activity Tolerance: Patient limited by pain Patient left: in chair;with call bell/phone within reach           Time: 1008-1037 PT Time Calculation (min) (ACUTE ONLY): 29 min   Charges:   PT Evaluation $Initial PT Evaluation Tier I: 1 Procedure PT Treatments $Gait Training: 8-22 mins   PT G Codes:        Fruma Africa,CYNDI 02/19/15, 11:13 AM  Magda Kiel, Ione 02-19-2015

## 2015-02-12 ENCOUNTER — Ambulatory Visit: Payer: Medicare Other | Admitting: Oncology

## 2015-02-12 ENCOUNTER — Other Ambulatory Visit: Payer: Medicare Other

## 2015-02-12 NOTE — Progress Notes (Signed)
    [facility] for Infect[facility]    Date of Admission:  02/09/2015   Total days of antibiotics 17        Day 2 oral vanco        Day IV vanco/rif   ID: Kevin Rogers is a 81 y.o. male with  Active Problems:   Back pain   Osteomyelitis ([place])    Subjective: Afebrile, starting to notice having formed stool     Objective: Vital signs in last 24 hours: Temp:  [98.4 F (36.9 C)] 98.4 F (36.9 C) (10/12 1056) Pulse Rate:  [83] 83 (10/12 1056) Resp:  [20] 20 (10/12 1056) BP: (134)/(68) 134/68 mmHg (10/12 1056) SpO2:  [100 %] 100 % (10/12 1056)  Physical Exam  Constitutional: He is oriented to person, place, and time. He appears well-developed and well-nourished. No distress.  HENT:  Mouth/Throat: Oropharynx is clear and moist. No oropharyngeal exudate.  Cardiovascular: Normal rate, regular rhythm and normal heart sounds. Exam reveals no gallop and no friction rub.  No murmur heard.  Pulmonary/Chest: Effort normal and breath sounds normal. No respiratory distress. He has no wheezes.  Abdominal: Soft. Bowel sounds are decreased. He exhibits no distension. There is no tenderness.  Lymphadenopathy:  He has no cervical adenopathy.  Neurological: He is alert and oriented to person, place, and time.  Skin: Skin is warm and dry. No rash noted. No erythema.  Psychiatric: He has a normal mood and affect. His behavior is normal.    Lab Results  Recent Labs  02/10/15 0435 02/11/15 0905  WBC 17.7* 15.7*  HGB 10.5* 10.6*  HCT 32.3* 32.6*  NA 134* 136  K 4.0 4.0  CL 97* 100*  CO2 28 28  BUN 13 12  CREATININE 1.34* 1.21   Liver Panel  Recent Labs  02/09/15 1441 02/10/15 0435  PROT 6.5 6.1*  ALBUMIN 2.8* 2.6*  AST 18 15  ALT 13* 13*  ALKPHOS 76 72  BILITOT 0.5 0.7   Sedimentation Rate  Recent Labs  02/09/15 1441  ESRSEDRATE 65*   C-Reactive Protein  Recent Labs  02/09/15 1441  CRP 12.1*    Microbiology: + cdifficile Studies/Results: No  results found.   Assessment/Plan: Post surgical infection of lumbar fusion s/p wound dehisc with infected seroma/Discitis = continue with IV vancomycin and rifampin per previous admission to finish out 6 wk course of therapy. He has appt with dr. Johnnye Sima at [facility] at end of the month for follow up  C.difficile colitis = continue to treat with a 14 d course of oral vanco 125mg  QID. I have instructed the patient to call us if his diarrhea does not improve or if he notices having recurrent watery diarrhea after he finishes this 14 d course of treatment. He may need to be kept of oral vanco while finishing his course of abtx for discitis  Baxter Flattery [facility] Diseases Cell: [phone] Pager: [phone]  02/12/2015, 7:47 AM

## 2015-02-13 DIAGNOSIS — M4626 Osteomyelitis of vertebra, lumbar region: Secondary | ICD-10-CM | POA: Diagnosis not present

## 2015-02-13 DIAGNOSIS — A047 Enterocolitis due to Clostridium difficile: Secondary | ICD-10-CM | POA: Diagnosis not present

## 2015-02-13 DIAGNOSIS — E119 Type 2 diabetes mellitus without complications: Secondary | ICD-10-CM | POA: Diagnosis not present

## 2015-02-13 DIAGNOSIS — M549 Dorsalgia, unspecified: Secondary | ICD-10-CM | POA: Diagnosis not present

## 2015-02-13 DIAGNOSIS — M4646 Discitis, unspecified, lumbar region: Secondary | ICD-10-CM | POA: Diagnosis not present

## 2015-02-14 DIAGNOSIS — A09 Infectious gastroenteritis and colitis, unspecified: Secondary | ICD-10-CM | POA: Insufficient documentation

## 2015-02-14 DIAGNOSIS — R5081 Fever presenting with conditions classified elsewhere: Secondary | ICD-10-CM | POA: Insufficient documentation

## 2015-02-14 DIAGNOSIS — A0472 Enterocolitis due to Clostridium difficile, not specified as recurrent: Secondary | ICD-10-CM | POA: Insufficient documentation

## 2015-02-14 LAB — CULTURE, BLOOD (ROUTINE X 2)
Culture: NO GROWTH
Culture: NO GROWTH

## 2015-03-02 ENCOUNTER — Encounter: Payer: Self-pay | Admitting: Infectious Diseases

## 2015-03-02 ENCOUNTER — Telehealth: Payer: Self-pay | Admitting: *Deleted

## 2015-03-02 ENCOUNTER — Ambulatory Visit (INDEPENDENT_AMBULATORY_CARE_PROVIDER_SITE_OTHER): Payer: Medicare Other | Admitting: Infectious Diseases

## 2015-03-02 ENCOUNTER — Ambulatory Visit: Payer: Medicare Other | Admitting: Family Medicine

## 2015-03-02 VITALS — BP 145/83 | HR 114 | Temp 97.8°F | Ht 72.0 in | Wt 230.0 lb

## 2015-03-02 DIAGNOSIS — M4626 Osteomyelitis of vertebra, lumbar region: Secondary | ICD-10-CM

## 2015-03-02 DIAGNOSIS — A047 Enterocolitis due to Clostridium difficile: Secondary | ICD-10-CM

## 2015-03-02 DIAGNOSIS — A0472 Enterocolitis due to Clostridium difficile, not specified as recurrent: Secondary | ICD-10-CM

## 2015-03-02 MED ORDER — RIFAMPIN 300 MG PO CAPS: 300.0000 mg | ORAL_CAPSULE | Freq: Every day | ORAL | Status: AC

## 2015-03-02 MED ORDER — VANCOMYCIN HCL IN DEXTROSE 750-5 MG/150ML-% IV SOLN: 750.0000 mg | Freq: Two times a day (BID) | INTRAVENOUS | Status: AC

## 2015-03-02 MED ORDER — DOXYCYCLINE HYCLATE 100 MG PO CAPS: 100.0000 mg | ORAL_CAPSULE | Freq: Two times a day (BID) | ORAL | Status: DC

## 2015-03-02 NOTE — Assessment & Plan Note (Signed)
Improving.  Has completed po vanco.

## 2015-03-02 NOTE — Telephone Encounter (Signed)
Patient notified of appt for MRI at [facility] in [place] on 03/11/15 at 8:45 AM. Order faxed to [phone]. Myrtis Hopping

## 2015-03-02 NOTE — Assessment & Plan Note (Addendum)
His CRP was mildly elevated at his last blood draw (2.4). Will recheck his MRI due to his pain and warmth.  Will transition him to po doxy at end of IV vanco/rifampin (03-09-15) Will see him back after MRI.

## 2015-03-02 NOTE — Progress Notes (Signed)
   Subjective:    Patient ID: Kevin Rogers, male    DOB: Nov 06, 1931, 81 y.o.   MRN: [id number]  HPI 81 yo M with hx of DM2 (since 1997), previous lumbar fusion 12-2012, laminectomy 06-24-14 and then debridement of wound (after dehisc) 07-23-2014. His wound Cx was (-) and he received vanco for 2 weeks.  He returns 01-27-15 with worsening back pain for 5 days and fever for 2 days. His back pain had progressed to the point he was unable to get out of bed without assist.  He was found in ED to have temp 102.5, WBC 14.6. He underwent MRI of back which showed:  When correlated with the recent CT, features are concerning for L1-2 diskitis and osteomyelitis. If there is no organism identified from blood cultures, L1-2 disc space aspiration could be performed. Shallow ventral soft tissue abnormality at L1-2, without significant stenosis or concern for frank epidural abscess. Regional RIGHT psoas edema is observed.  1/2 BCx+ for [facility] which ultimately returned as CNS.  His IR aspirate Cx on 9-29 was negative.  He was d/c to SNF on 10-2 on vancomycin and rifampin.  He complained of swelling in his LLE- an u/s was (-).  He returned to hospital on 10-10 with C diff. He was d/c back to SNF on 10-12 and given 2 weeks po vanco (completed 10-26).  Feeling better- his back pain has decreased (he is also on pain patch now).  His most recent labs show CRP 2.4, Cr 1.15, H/H 10/32 Diarrhea has resolved (not watery or runny). Going 1-2x/day.   Review of Systems  Constitutional: Negative for fever and chills.  Cardiovascular: Negative for leg swelling.  Genitourinary: Negative for difficulty urinating.  Neurological: Negative for numbness.  FSG have been "relatively good" has only need SSI 1-2x in last 2 weeks. Usually in 120-130s.  No problems with PIC line.  Please see HPI. 12 point ROS o/w (-)      Objective:   Physical Exam  Constitutional: He appears well-developed and well-nourished.  HENT:    Mouth/Throat: No oropharyngeal exudate.  Eyes: EOM are normal. Pupils are equal, round, and reactive to light.  Neck: Neck supple.  Cardiovascular: Normal rate, regular rhythm and normal heart sounds.   Pulmonary/Chest: Effort normal and breath sounds normal.  Abdominal: Soft. Bowel sounds are normal. There is no tenderness. There is no rebound.  Musculoskeletal:       Arms:      Legs: Lymphadenopathy:    He has no cervical adenopathy.      Assessment & Plan:

## 2015-03-05 ENCOUNTER — Ambulatory Visit: Payer: Medicare Other | Admitting: Podiatry

## 2015-03-12 NOTE — Telephone Encounter (Signed)
Appt rescheduled for 03/17/15 at 9:45 AM. Auth. # [id number]. Patient aware

## 2015-03-16 ENCOUNTER — Encounter: Payer: Self-pay | Admitting: Infectious Diseases

## 2015-03-17 DIAGNOSIS — Z9889 Other specified postprocedural states: Secondary | ICD-10-CM | POA: Diagnosis not present

## 2015-03-17 DIAGNOSIS — M47817 Spondylosis without myelopathy or radiculopathy, lumbosacral region: Secondary | ICD-10-CM | POA: Diagnosis not present

## 2015-03-17 DIAGNOSIS — M5136 Other intervertebral disc degeneration, lumbar region: Secondary | ICD-10-CM | POA: Diagnosis not present

## 2015-03-23 ENCOUNTER — Encounter: Payer: Self-pay | Admitting: Family Medicine

## 2015-03-23 ENCOUNTER — Ambulatory Visit (INDEPENDENT_AMBULATORY_CARE_PROVIDER_SITE_OTHER): Payer: Medicare Other | Admitting: Family Medicine

## 2015-03-23 ENCOUNTER — Ambulatory Visit: Payer: Medicare Other | Admitting: Family Medicine

## 2015-03-23 VITALS — BP 148/80 | HR 85 | Temp 98.0°F | Wt 228.0 lb

## 2015-03-23 DIAGNOSIS — M4626 Osteomyelitis of vertebra, lumbar region: Secondary | ICD-10-CM | POA: Diagnosis not present

## 2015-03-23 DIAGNOSIS — I10 Essential (primary) hypertension: Secondary | ICD-10-CM | POA: Diagnosis not present

## 2015-03-23 DIAGNOSIS — Z23 Encounter for immunization: Secondary | ICD-10-CM | POA: Diagnosis not present

## 2015-03-23 MED ORDER — FENTANYL 75 MCG/HR TD PT72: 75.0000 ug | MEDICATED_PATCH | TRANSDERMAL | Status: DC

## 2015-03-23 MED ORDER — OXYCODONE-ACETAMINOPHEN 5-325 MG PO TABS: 1.0000 | ORAL_TABLET | Freq: Three times a day (TID) | ORAL | Status: DC | PRN

## 2015-03-23 NOTE — Patient Instructions (Addendum)
Final pneumonia shot [id number]) received today.  Increase fentanyl patch strength. Change every 3 days  Refilled oxycodone pain medicine

## 2015-03-23 NOTE — Assessment & Plan Note (Signed)
S: Losartan had fallen off med list but patient still taking. BP poor control today BP Readings from Last 3 Encounters:  03/23/15 148/80  03/02/15 145/83  02/11/15 134/68  A/P: could be related to pain- continue current rx but consider titration to 100mg  if remains high after pain management adjustments today

## 2015-03-23 NOTE — Progress Notes (Signed)
Garret Reddish, MD  Subjective:  Kevin Rogers is a 81 y.o. year old very pleasant male patient who presents for/with See problem oriented charting ROS- No chest pain or shortness of breath. No headache or blurry vision.   Past Medical History-  Patient Active Problem List   Diagnosis Date Noted  . Osteomyelitis of lumbar vertebra (HCC)     Priority: High  . Chronic lymphocytic leukemia ([place]) 03/08/2007    Priority: High  . Diabetes mellitus type II, controlled ([place]) 03/08/2007    Priority: High  . Hyperlipidemia     Priority: Medium  . CKD (chronic kidney disease), stage III 01/13/2015    Priority: Medium  . BPH (benign prostatic hyperplasia) 06/24/2014    Priority: Medium  . Gout 03/08/2007    Priority: Medium  . Essential hypertension 03/08/2007    Priority: Medium  . Lumbar spinal stenosis 06/26/2014    Priority: Low  . B12 deficiency 12/12/2008    Priority: Low  . Diabetic polyneuropathy ([place]) 12/12/2008    Priority: Low  . ERECTILE DYSFUNCTION 03/18/2008    Priority: Low  . Glaucoma 03/08/2007    Priority: Low  . Osteoarthritis 03/08/2007    Priority: Low  . Enteritis due to Clostridium difficile   . Discitis of lumbar region     Medications- reviewed and updated Current Outpatient Prescriptions  Medication Sig Dispense Refill  . allopurinol (ZYLOPRIM) 300 MG tablet Take 1 tablet by mouth  daily 90 tablet 2  . doxycycline (VIBRAMYCIN) 100 MG capsule Take 1 capsule (100 mg total) by mouth 2 (two) times daily. 60 capsule 3  . fentaNYL (DURAGESIC - DOSED MCG/HR) 75 MCG/HR Place 1 patch (75 mcg total) onto the skin every 3 (three) days. 10 patch 0  . glipiZIDE (GLUCOTROL XL) 5 MG 24 hr tablet Take 1 tablet (5 mg total) by mouth daily. 90 tablet 2  . glucose blood (ONETOUCH VERIO) test strip Use to test blood sugars daily. Dx: E11.9 100 each 12  . insulin aspart (NOVOLOG) 100 UNIT/ML injection Inject 0-10 Units into the skin 3 (three) times daily before meals. If  60-150=0units, 151-250=2units, 251-300=4units, 301-350=6units, 351-400=8units, 401-450=10units, 451+ call md for further instructions    . losartan (COZAAR) 100 MG tablet Take 100 mg by mouth daily. Patient takes 1/2 tab    . nabumetone (RELAFEN) 500 MG tablet Take 500 mg by mouth 2 (two) times daily as needed. Stopped for procedure  3  . ONE TOUCH LANCETS MISC Use to test blood sugars. Dx:E11.9 100 each 11  . oxyCODONE-acetaminophen (PERCOCET/ROXICET) 5-325 MG tablet Take 1-2 tablets by mouth 3 (three) times daily as needed for moderate pain (separate dose by at least 4 hours). 90 tablet 0  . tamsulosin (FLOMAX) 0.4 MG CAPS capsule Take 1 capsule by mouth  daily 90 capsule 1  . TRAVATAN Z 0.004 % SOLN ophthalmic solution Place 1 drop into both eyes at bedtime.     . vitamin B-12 (CYANOCOBALAMIN) 1000 MCG tablet Take 1,000 mcg by mouth daily.    [name] acetaminophen (TYLENOL) 500 MG tablet Take 500-1,000 mg by mouth every 6 (six) hours as needed (pain).     No current facility-administered medications for this visit.    Objective: BP 148/80 mmHg  Pulse 85  Temp(Src) 98 F (36.7 C)  Wt 228 lb (103.42 kg) Gen: NAD, resting comfortably CV: RRR no murmurs rubs or gallops Lungs: CTAB no crackles, wheeze, rhonchi Abdomen: soft/nontender/nondistended/normal bowel sounds. No rebound or guarding.  Ext: no edema  Back: minimal pain to palpation of lumbar spine. No obvious warmth Skin: warm, dry Neuro: grossly normal, moves all extremities. Walks with walker  Assessment/Plan:  Osteomyelitis of lumbar vertebra (HCC) S: ongoing pain in low back centrally but also some pain L>r. Pain rated as severe and minimally helped by oxycodone  5-325 2 pills TID with meals as well as fentanyl 31mcg patch. Saw Dr. Johnnye Sima a few weeks ago- has continued on doxycycline after IV vancomycin and rifampin finished. No worsening of pain since that time. Plan was for MRI which I cannot locate in his records and sees Dr.  Johnnye Sima next week.  A/P: cannot locate MRI so hopeful this was sent to Dr. Johnnye Sima specifically. For pain control will increase fentanyl to 75 mcg until follow up with Dr. Johnnye Sima. I am concerned that patient may need to transition to pain management at this point given back pain predating and continuing after surgery. I also refilled his roxicet   Essential hypertension S: Losartan had fallen off med list but patient still taking. BP poor control today BP Readings from Last 3 Encounters:  03/23/15 148/80  03/02/15 145/83  02/11/15 134/68  A/P: could be related to pain- continue current rx but consider titration to 100mg  if remains high after pain management adjustments today   Return precautions advised.   Orders Placed This Encounter  Procedures  . Pneumococcal conjugate vaccine 13-valent    Meds ordered this encounter  Medications  . losartan (COZAAR) 100 MG tablet    Sig: Take 100 mg by mouth daily. Patient takes 1/2 tab  . fentaNYL (DURAGESIC - DOSED MCG/HR) 75 MCG/HR    Sig: Place 1 patch (75 mcg total) onto the skin every 3 (three) days.    Dispense:  10 patch    Refill:  0  . oxyCODONE-acetaminophen (PERCOCET/ROXICET) 5-325 MG tablet    Sig: Take 1-2 tablets by mouth 3 (three) times daily as needed for moderate pain (separate dose by at least 4 hours).    Dispense:  90 tablet    Refill:  0

## 2015-03-23 NOTE — Assessment & Plan Note (Signed)
S: ongoing pain in low back centrally but also some pain L>r. Pain rated as severe and minimally helped by oxycodone  5-325 2 pills TID with meals as well as fentanyl 67mcg patch. Saw Dr. Johnnye Sima a few weeks ago- has continued on doxycycline after IV vancomycin and rifampin finished. No worsening of pain since that time. Plan was for MRI which I cannot locate in his records and sees Dr. Johnnye Sima next week.  A/P: cannot locate MRI so hopeful this was sent to Dr. Johnnye Sima specifically. For pain control will increase fentanyl to 75 mcg until follow up with Dr. Johnnye Sima. I am concerned that patient may need to transition to pain management at this point given back pain predating and continuing after surgery. I also refilled his roxicet

## 2015-03-27 ENCOUNTER — Other Ambulatory Visit: Payer: Medicare Other

## 2015-03-27 ENCOUNTER — Ambulatory Visit: Payer: Medicare Other | Admitting: Oncology

## 2015-03-31 ENCOUNTER — Telehealth: Payer: Self-pay | Admitting: *Deleted

## 2015-03-31 ENCOUNTER — Ambulatory Visit (INDEPENDENT_AMBULATORY_CARE_PROVIDER_SITE_OTHER): Payer: Medicare Other | Admitting: Infectious Diseases

## 2015-03-31 ENCOUNTER — Encounter: Payer: Self-pay | Admitting: Infectious Diseases

## 2015-03-31 VITALS — BP 133/73 | HR 82 | Temp 98.7°F | Ht 72.0 in | Wt 225.2 lb

## 2015-03-31 DIAGNOSIS — M4626 Osteomyelitis of vertebra, lumbar region: Secondary | ICD-10-CM

## 2015-03-31 DIAGNOSIS — E1122 Type 2 diabetes mellitus with diabetic chronic kidney disease: Secondary | ICD-10-CM

## 2015-03-31 DIAGNOSIS — N182 Chronic kidney disease, stage 2 (mild): Secondary | ICD-10-CM

## 2015-03-31 MED ORDER — DOXYCYCLINE HYCLATE 100 MG PO CAPS: 100.0000 mg | ORAL_CAPSULE | Freq: Two times a day (BID) | ORAL | Status: DC

## 2015-03-31 NOTE — Telephone Encounter (Signed)
Dr. Johnnye Sima wanting the pt seen within 3 weeks.  Message left for office to either call the pt with an appointment or to call [facility].

## 2015-03-31 NOTE — Progress Notes (Signed)
   Subjective:    Patient ID: Kevin Rogers, male    DOB: 01/19/1932, 81 y.o.   MRN: [id number]  HPI 81 yo M with hx of DM2 (since 1997), previous lumbar fusion 12-2012, laminectomy with screws and spacers 06-24-14 and then debridement of wound (after dehisc) 07-23-2014 (Dr Alric Seton).  His wound Cx was (-) and he received vanco for 2 weeks.  He returned 01-27-15 with worsening back pain and fever. MRI of back which showed: features are concerning for L1-2 diskitis and osteomyelitisShallow ventral soft tissue abnormality at L1-2, without significant stenosis or concern for frank epidural abscess. Regional RIGHT psoas edema is observed.  1/2 BCx+ for CNS.  His IR aspirate Cx on 9-29 was negative.  He was d/c to SNF on 10-2 on vancomycin and rifampin.  He returned to hospital on 10-10 with C diff. He was d/c back to SNF on 10-12 and given 2 weeks po vanco (completed 10-26). Labs around his visit 03-02-15 showed CRP 2.4, Cr 1.15, H/H 10/32  Today describes his back as "bad, terrible". Having pain with standing up, moving around. No f/c. He was transitioned to doxycycline when he left rehab on 03-04-15.  He had MRI of his back on 03-17-15: 1. Posterior decompression and hardware fixation at L1-L2 to L2-L3. Decompression and bilateral pedicle screw hardware removal at L3-L4. 2. Widening of the disc with a large amount of fluid and endplate irregularity at T12-L1. There is minimal amount of fluid and endplate irregularity at L1-L2. Paraspinal soft tissue enhancement and epidural enhancement is noted in this region. This can  represent the sequela from a discitis/osteomyelitis, recommend correlation with prior imaging. 3. Multilevel degenerative disc disease and facet arthrosis as described above.  Has not had f/u with his surgeon.   Review of Systems  Constitutional: Negative for fever and chills.  Gastrointestinal: Positive for constipation. Negative for nausea and diarrhea.  Musculoskeletal:  Positive for back pain.  Neurological: Negative for numbness.  dry skin FSG 80-140. Has not seen ophtho this year.  Please see HPI. 12 point ROS o/w (-)      Objective:   Physical Exam  Constitutional: He appears well-developed and well-nourished.  HENT:  Mouth/Throat: No oropharyngeal exudate.  Eyes: EOM are normal. Pupils are equal, round, and reactive to light.  Neck: Neck supple.  Cardiovascular: Normal rate, regular rhythm and normal heart sounds.   Pulmonary/Chest: Effort normal and breath sounds normal.  Abdominal: Soft. Bowel sounds are normal. There is no tenderness. There is no rebound.  Musculoskeletal: He exhibits no edema.       Arms: Lymphadenopathy:    He has no cervical adenopathy.  Neurological: No sensory deficit. He exhibits normal muscle tone.       Assessment & Plan:

## 2015-03-31 NOTE — Assessment & Plan Note (Addendum)
Will continue him on doxy Will have him seen by neurosurgery Will defer his pain rx to his PCP and neurosurgery Will plan on seeing him back in 6 weeks.  Spoke at length (> 20 minutes) with pt and his wife ab out expected recovry, need for specialist f/u.   Has gotten flu shot.

## 2015-03-31 NOTE — Assessment & Plan Note (Signed)
He will f/u with his PCP.  Appears to be fairly well controlled.

## 2015-04-03 DIAGNOSIS — M545 Low back pain: Secondary | ICD-10-CM | POA: Diagnosis not present

## 2015-04-03 DIAGNOSIS — Z6830 Body mass index (BMI) 30.0-30.9, adult: Secondary | ICD-10-CM | POA: Diagnosis not present

## 2015-04-15 DIAGNOSIS — M461 Sacroiliitis, not elsewhere classified: Secondary | ICD-10-CM | POA: Diagnosis not present

## 2015-04-15 DIAGNOSIS — Z6830 Body mass index (BMI) 30.0-30.9, adult: Secondary | ICD-10-CM | POA: Diagnosis not present

## 2015-04-17 ENCOUNTER — Telehealth: Payer: Self-pay | Admitting: Family Medicine

## 2015-04-17 NOTE — Telephone Encounter (Signed)
This was just filled on 03/23/15 with #90.

## 2015-04-17 NOTE — Telephone Encounter (Signed)
You may refill. Please only give #80. Change to one tablet three times a day. Tell patient we are going to have to begin process of weaning down at this point or if he prefers- we can refer to pain management.

## 2015-04-17 NOTE — Telephone Encounter (Signed)
Pt called for a Rx of Oxycodone. Please contact him if necessary.  Pt's ph# [phone] Thank you.

## 2015-04-20 MED ORDER — OXYCODONE-ACETAMINOPHEN 5-325 MG PO TABS: 1.0000 | ORAL_TABLET | Freq: Three times a day (TID) | ORAL | Status: DC | PRN

## 2015-04-20 NOTE — Telephone Encounter (Signed)
Pt  Notified and aware of Dr. Yong Channel message. He will let us know if he wants to wean down with Dr. Yong Channel or go directly through pain management once he thinks this over.

## 2015-04-20 NOTE — Telephone Encounter (Signed)
Pt aware Rx upfront for pick up.

## 2015-05-05 ENCOUNTER — Other Ambulatory Visit: Payer: Self-pay | Admitting: Family Medicine

## 2015-05-05 ENCOUNTER — Ambulatory Visit (INDEPENDENT_AMBULATORY_CARE_PROVIDER_SITE_OTHER): Payer: Medicare Other | Admitting: Infectious Diseases

## 2015-05-05 ENCOUNTER — Encounter: Payer: Self-pay | Admitting: Infectious Diseases

## 2015-05-05 VITALS — BP 154/79 | HR 74 | Temp 98.2°F | Ht 72.0 in | Wt 222.8 lb

## 2015-05-05 DIAGNOSIS — M4626 Osteomyelitis of vertebra, lumbar region: Secondary | ICD-10-CM

## 2015-05-05 LAB — COMPREHENSIVE METABOLIC PANEL
ALT: 10 U/L (ref 9–46)
AST: 12 U/L (ref 10–35)
Albumin: 4 g/dL (ref 3.6–5.1)
Alkaline Phosphatase: 96 U/L (ref 40–115)
BUN: 24 mg/dL (ref 7–25)
CO2: 26 mmol/L (ref 20–31)
Calcium: 9.3 mg/dL (ref 8.6–10.3)
Chloride: 108 mmol/L (ref 98–110)
Creat: 1.39 mg/dL — ABNORMAL HIGH (ref 0.70–1.11)
Glucose, Bld: 80 mg/dL (ref 65–99)
Potassium: 5.2 mmol/L (ref 3.5–5.3)
Sodium: 142 mmol/L (ref 135–146)
Total Bilirubin: 0.7 mg/dL (ref 0.2–1.2)
Total Protein: 6 g/dL — ABNORMAL LOW (ref 6.1–8.1)

## 2015-05-05 LAB — C-REACTIVE PROTEIN: CRP: <0.5 mg/dL (ref <0.60)

## 2015-05-05 NOTE — Assessment & Plan Note (Addendum)
He appears to be doing well.  Will check his labs today, plan on seeing him back in 3 months (that would be 6 months of anbx) If his labs today and in 3 months are normal, will stop anbx If abn, check MRI  He will call back in 48 h for lab results.

## 2015-05-05 NOTE — Progress Notes (Signed)
   Subjective:    Patient ID: Kevin Rogers, male    DOB: 1931/07/12, 82 y.o.   MRN: [id number]  HPI 82 yo M with hx of DM2 (since 1997), previous lumbar fusion 12-2012, laminectomy with screws and spacers 06-24-14 and then debridement of wound (after dehisc) 07-23-2014 (Dr Alric Seton). His wound Cx was (-) and he received vanco for 2 weeks.  He returned 01-27-15 with worsening back pain and fever. MRI of back which showed: features are concerning for L1-2 diskitis and osteomyelitisShallow ventral soft tissue abnormality at L1-2, without significant stenosis or concern for frank epidural abscess. Regional RIGHT psoas edema is observed.  1/2 BCx+ for CNS.  His IR aspirate Cx on 9-29 was negative.  He was d/c to SNF on 10-2 on vancomycin and rifampin.  He returned to hospital on 10-10 with C diff. He was d/c back to SNF on 10-12 and given 2 weeks po vanco (completed 10-26). Labs around his visit 03-02-15 showed CRP 2.4, Cr 1.15, H/H 10/32.  He was transitioned to doxycycline when he left rehab on 03-04-15.  He had MRI of his back on 03-17-15: 1. Posterior decompression and hardware fixation at L1-L2 to L2-L3. Decompression and bilateral pedicle screw hardware removal at L3-L4. 2. Widening of the disc with a large amount of fluid and endplate irregularity at T12-L1. There is minimal amount of fluid and endplate irregularity at L1-L2. Paraspinal soft tissue enhancement and epidural enhancement is noted in this region. This can  represent the sequela from a discitis/osteomyelitis, recommend correlation with prior imaging. 3. Multilevel degenerative disc disease and facet arthrosis as described above.  He was seen in ID on 11-29 and was continued on his doxy, was to f/u with surgery.  Today he states he feels suprisingly good today- pain is much better. Still on doxy.  Has not had f/u with surgery.  No fever or chills.  Feels like he has bladder irritation- no dysuria. Has pressure sensation  when he needs to urinate, going more often. Feels urge to go more acutely than previous. Has been taking stool softener, eating prunes.   Review of Systems See above.  Please see HPI. 12 point ROS o/w (-)     Objective:   Physical Exam  Constitutional: He appears well-developed and well-nourished.  HENT:  Mouth/Throat: No oropharyngeal exudate.  Eyes: EOM are normal. Pupils are equal, round, and reactive to light.  Neck: Neck supple.  Cardiovascular: Normal rate, regular rhythm and normal heart sounds.   Pulmonary/Chest: Effort normal and breath sounds normal.  Abdominal: Soft. Bowel sounds are normal. There is no tenderness. There is no rebound.  Musculoskeletal:       Lumbar back: He exhibits no tenderness, no bony tenderness and no swelling.  Lymphadenopathy:    He has no cervical adenopathy.       Assessment & Plan:

## 2015-05-06 LAB — CBC
HCT: 36.5 % — ABNORMAL LOW (ref 39.0–52.0)
Hemoglobin: 12.2 g/dL — ABNORMAL LOW (ref 13.0–17.0)
MCH: 27.8 pg (ref 26.0–34.0)
MCHC: 33.4 g/dL (ref 30.0–36.0)
MCV: 83.1 fL (ref 78.0–100.0)
MPV: 9.9 fL (ref 8.6–12.4)
Platelets: 170 10*3/uL (ref 150–400)
RBC: 4.39 MIL/uL (ref 4.22–5.81)
RDW: 19.2 % — ABNORMAL HIGH (ref 11.5–15.5)
WBC: 20.5 10*3/uL — ABNORMAL HIGH (ref 4.0–10.5)

## 2015-05-06 LAB — SEDIMENTATION RATE: Sed Rate: 3 mm/h (ref 0–20)

## 2015-05-08 ENCOUNTER — Telehealth: Payer: Self-pay | Admitting: *Deleted

## 2015-05-08 NOTE — Telephone Encounter (Signed)
Patient called for results of his lab work; please advise. Kevin Rogers

## 2015-05-15 ENCOUNTER — Telehealth: Payer: Self-pay | Admitting: Infectious Diseases

## 2015-05-15 NOTE — Telephone Encounter (Signed)
Pt's ESR and CRP are normal, would anticipate 3 more months of doxy.  i have called pt.

## 2015-05-15 NOTE — Telephone Encounter (Signed)
Plan to complete doxy ads scheduled,  Pt called and spoke with him on phone.  Will see him back in March.  His WBC is up from his CLL

## 2015-05-23 ENCOUNTER — Other Ambulatory Visit: Payer: Self-pay | Admitting: Family Medicine

## 2015-05-26 DIAGNOSIS — M47817 Spondylosis without myelopathy or radiculopathy, lumbosacral region: Secondary | ICD-10-CM | POA: Diagnosis not present

## 2015-06-01 ENCOUNTER — Telehealth: Payer: Self-pay | Admitting: Oncology

## 2015-06-01 DIAGNOSIS — H401122 Primary open-angle glaucoma, left eye, moderate stage: Secondary | ICD-10-CM | POA: Diagnosis not present

## 2015-06-01 DIAGNOSIS — H2513 Age-related nuclear cataract, bilateral: Secondary | ICD-10-CM | POA: Diagnosis not present

## 2015-06-01 NOTE — Telephone Encounter (Signed)
Pt called to r/s apt from Nov 2016 pt confirmed labs/ov... KJ

## 2015-06-09 ENCOUNTER — Other Ambulatory Visit: Payer: Self-pay | Admitting: *Deleted

## 2015-06-09 DIAGNOSIS — C9110 Chronic lymphocytic leukemia of B-cell type not having achieved remission: Secondary | ICD-10-CM

## 2015-06-10 ENCOUNTER — Ambulatory Visit (HOSPITAL_BASED_OUTPATIENT_CLINIC_OR_DEPARTMENT_OTHER): Payer: Medicare Other | Admitting: Oncology

## 2015-06-10 ENCOUNTER — Other Ambulatory Visit (HOSPITAL_BASED_OUTPATIENT_CLINIC_OR_DEPARTMENT_OTHER): Payer: Medicare Other

## 2015-06-10 ENCOUNTER — Telehealth: Payer: Self-pay | Admitting: Oncology

## 2015-06-10 VITALS — BP 138/66 | HR 75 | Temp 98.2°F | Resp 18 | Ht 72.0 in | Wt 235.5 lb

## 2015-06-10 DIAGNOSIS — D72820 Lymphocytosis (symptomatic): Secondary | ICD-10-CM

## 2015-06-10 DIAGNOSIS — C9110 Chronic lymphocytic leukemia of B-cell type not having achieved remission: Secondary | ICD-10-CM

## 2015-06-10 DIAGNOSIS — M869 Osteomyelitis, unspecified: Secondary | ICD-10-CM | POA: Diagnosis not present

## 2015-06-10 LAB — CBC WITH DIFFERENTIAL/PLATELET
BASO%: 0.1 % (ref 0.0–2.0)
Basophils Absolute: 0 10*3/uL (ref 0.0–0.1)
EOS%: 0.7 % (ref 0.0–7.0)
Eosinophils Absolute: 0.1 10*3/uL (ref 0.0–0.5)
HCT: 35.6 % — ABNORMAL LOW (ref 38.4–49.9)
HGB: 11.6 g/dL — ABNORMAL LOW (ref 13.0–17.1)
LYMPH%: 76.1 % — ABNORMAL HIGH (ref 14.0–49.0)
MCH: 28.4 pg (ref 27.2–33.4)
MCHC: 32.6 g/dL (ref 32.0–36.0)
MCV: 87 fL (ref 79.3–98.0)
MONO#: 0.2 10*3/uL (ref 0.1–0.9)
MONO%: 1.5 % (ref 0.0–14.0)
NEUT#: 3.1 10*3/uL (ref 1.5–6.5)
NEUT%: 21.6 % — ABNORMAL LOW (ref 39.0–75.0)
Platelets: 163 10*3/uL (ref 140–400)
RBC: 4.09 10*6/uL — ABNORMAL LOW (ref 4.20–5.82)
RDW: 17.4 % — ABNORMAL HIGH (ref 11.0–14.6)
WBC: 14.4 10*3/uL — ABNORMAL HIGH (ref 4.0–10.3)
lymph#: 10.9 10*3/uL — ABNORMAL HIGH (ref 0.9–3.3)

## 2015-06-10 NOTE — Telephone Encounter (Signed)
per pof to sch pt appt-gave pt copy of avs °

## 2015-06-10 NOTE — Progress Notes (Signed)
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  CURRENT THERAPY: Observation and surveillance.  INTERVAL HISTORY:  Kevin Rogers presents today for a follow-up visit with his wife. Since his last visit, he was diagnosed with osteomyelitis of the lumbar spine and required long course of intravenous antibiotics and currently on oral antibiotics. He have recovered fairly well since that time and is ambulating without any major difficulties. He is no longer reporting any fevers, chills or weight loss. His appetite have improved and have regained all activities of daily living.   Has not reported any falls or syncope. Has not reported any fevers or chills or sweats. He denied any lymphadenopathy or satiety. No major changes in his performance status as of late.   He denies any headaches, blurry vision, syncope or seizures. He does not report any night sweats or decline in his performance status. He does not report any chest pain, palpitation, orthopnea or leg edema. Does not report any cough, wheezing or hemoptysis. He does not report any abdominal pain, nausea, vomiting, constipation, diarrhea. He does not report any dysuria, urinary frequency or hematuria.The remainder review of systems unremarkable.  MEDICAL HISTORY: Past Medical History  Diagnosis Date  . Diabetes mellitus type 2 with complications ([place]) 0000000  . Hyperlipidemia   . Hypertension   . Leukemia, chronic ([place]) 2001    does not take treatment just has blood levels checked once a year  . Glaucoma     left eye  . Gout   . Acute bronchitis 09/12/2007    Qualifier: Diagnosis of  By: [name] MD, [name]     IALLERGIES:  has No Known Allergies.  MEDICATIONS: has a current medication list which includes the following prescription(s): acetaminophen, allopurinol, doxycycline, fentanyl, glipizide, glucose  blood, insulin aspart, losartan, meloxicam, nabumetone, one touch lancets, oxycodone-acetaminophen, tamsulosin, travatan z, and vitamin b-12.   PHYSICAL EXAMINATION: ECOG PERFORMANCE STATUS: 1  Blood pressure 138/66, pulse 75, temperature 98.2 F (36.8 C), resp. rate 18, height 6' (1.829 m), weight 235 lb 8 oz (106.822 kg), SpO2 100 %.  GENERAL:alert, without distress today. SKIN: no rashes or significant lesions EYES: Sclerae is anicteric. No erythema noted. NECK: supple, thyroid normal size, non-tender, without nodularity LYMPH:  no palpable lymphadenopathy in the cervical, axillary or supraclavicular LUNGS: clear to auscultation and percussion without rhonchi, or wheezes. HEART: regular rate & rhythm and no murmurs and no lower extremity edema ABDOMEN:abdomen soft, non-tender and normal bowel sounds no splenomegaly. Musculoskeletal:no cyanosis of digits and no clubbing  NEURO: alert & oriented x 3 with fluent speech, no focal motor/sensory deficits  LABORATORY DATA: Results for orders placed or performed in visit on 06/10/15 (from the past 48 hour(s))  CBC with Differential     Status: Abnormal   Collection Time: 06/10/15  8:29 AM  Result Value Ref Range   WBC 14.4 (H) 4.0 - 10.3 10e3/uL   NEUT# 3.1 1.5 - 6.5 10e3/uL   HGB 11.6 (L) 13.0 - 17.1 g/dL   HCT 35.6 (L) 38.4 - 49.9 %   Platelets 163 140 - 400 10e3/uL   MCV 87.0 79.3 - 98.0 fL   MCH 28.4 27.2 - 33.4 pg   MCHC 32.6 32.0 - 36.0 g/dL   RBC 4.09 (L) 4.20 - 5.82 10e6/uL   RDW 17.4 (H) 11.0 - 14.6 %   lymph# 10.9 (H) 0.9 - 3.3 10e3/uL  MONO# 0.2 0.1 - 0.9 10e3/uL   Eosinophils Absolute 0.1 0.0 - 0.5 10e3/uL   Basophils Absolute 0.0 0.0 - 0.1 10e3/uL   NEUT% 21.6 (L) 39.0 - 75.0 %   LYMPH% 76.1 (H) 14.0 - 49.0 %   MONO% 1.5 0.0 - 14.0 %   EOS% 0.7 0.0 - 7.0 %   BASO% 0.1 0.0 - 2.0 %       ASSESSMENT: Kevin Rogers 82 y.o. male with:   1. 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 This was  initially diagnosed in 2014 although his lymphocytosis dates back to 2007. His laboratory testing today and clinical evaluation do not suggest any progression of his disease. His white cell count has not really change in the last 10 years. It remains mildly elevated however.  I continue to educate him about signs and symptoms of progression of disease which include rapid increase in his lymphocytes, painful lymphadenopathy, painful splenomegaly or cytopenias. The time being he does not have any of these symptoms and unlikely to develop some any time soon. I have recommended continued observation and surveillance for the time being.  2. Osteomyelitis of the lumbar spine: Appears to have recovered well at this time and finishing oral antibiotics.  3. Follow-up: Will be in 6 months.  [facility] MD 06/10/2015

## 2015-07-06 ENCOUNTER — Encounter: Payer: Self-pay | Admitting: Infectious Diseases

## 2015-07-06 ENCOUNTER — Ambulatory Visit (INDEPENDENT_AMBULATORY_CARE_PROVIDER_SITE_OTHER): Payer: Medicare Other | Admitting: Infectious Diseases

## 2015-07-06 DIAGNOSIS — M4626 Osteomyelitis of vertebra, lumbar region: Secondary | ICD-10-CM | POA: Diagnosis not present

## 2015-07-06 DIAGNOSIS — M4806 Spinal stenosis, lumbar region: Secondary | ICD-10-CM

## 2015-07-06 DIAGNOSIS — M48061 Spinal stenosis, lumbar region without neurogenic claudication: Secondary | ICD-10-CM

## 2015-07-06 LAB — SEDIMENTATION RATE: Sed Rate: 1 mm/hr (ref 0–20)

## 2015-07-06 NOTE — Assessment & Plan Note (Signed)
Will refer him for PT Asks for percocet refill, i referred him to his pain md.

## 2015-07-06 NOTE — Progress Notes (Signed)
   Subjective:    Patient ID: Kevin Rogers, male    DOB: [birth year], 82 y.o.   MRN: [id number]  HPI 82 yo M with hx of CLL, DM2 (since 1997), previous lumbar fusion 12-2012, laminectomy with screws and spacers 06-24-14 and then debridement of wound (after dehisc) 07-23-2014 (Dr Alric Seton). His wound Cx was (-) and he received vanco for 2 weeks.  He returned 01-27-15 with worsening back pain and fever. MRI of back which showed: features are concerning for L1-2 diskitis and osteomyelitisShallow ventral soft tissue abnormality at L1-2, without significant stenosis or concern for frank epidural abscess. Regional RIGHT psoas edema is observed.  1/2 BCx+ for CNS.  His IR aspirate Cx on 9-29 was negative.  He was d/c to SNF on 10-2 on vancomycin and rifampin.  He returned to hospital on 10-10 with C diff. He was d/c back to SNF on 10-12 and given 2 weeks po vanco (completed 10-26). Labs around his visit 03-02-15 showed CRP 2.4, Cr 1.15, H/H 10/32.  He was transitioned to doxycycline when he left rehab on 03-04-15.  He had MRI of his back on 03-17-15: 1. Posterior decompression and hardware fixation at L1-L2 to L2-L3. Decompression and bilateral pedicle screw hardware removal at L3-L4. 2. Widening of the disc with a large amount of fluid and endplate irregularity at T12-L1. There is minimal amount of fluid and endplate irregularity at L1-L2. Paraspinal soft tissue enhancement and epidural enhancement is noted in this region. This can represent the sequela from a discitis/osteomyelitis, recommend correlation with prior imaging. 3. Multilevel degenerative disc disease and facet arthrosis as described above.  He was seen in ID on 05-2015, his ESR was normal. He was continued on his doxy, was to f/u with surgery. States his back is fine. Worse when he is not sitting.  No problems with doxy.  Has not been back to Dr Hal Neer. Has had f/u with his pain MD ([place]). Has been getting injections which have  helped.   Review of Systems  Constitutional: Negative for appetite change and unexpected weight change.  Gastrointestinal: Negative for constipation and blood in stool.  Genitourinary: Negative for difficulty urinating.  Musculoskeletal: Positive for back pain.       Objective:   Physical Exam  Constitutional: He appears well-developed and well-nourished.  HENT:  Mouth/Throat: No oropharyngeal exudate.  Eyes: EOM are normal. Pupils are equal, round, and reactive to light.  Cardiovascular: Normal rate, regular rhythm and normal heart sounds.   Pulmonary/Chest: Effort normal and breath sounds normal.  Abdominal: Soft. Bowel sounds are normal. There is no tenderness. There is no rebound.  Musculoskeletal:       Arms:      Assessment & Plan:

## 2015-07-06 NOTE — Assessment & Plan Note (Signed)
He is doing well.  Will recheck his ESR. Stop his anbx today.  Provided his ESR is normal, will defer getting MRI. Discussed with pt and his wife.  He will f/u with his PCP and Dr Hal Neer.  He may start PT.

## 2015-07-08 ENCOUNTER — Other Ambulatory Visit (INDEPENDENT_AMBULATORY_CARE_PROVIDER_SITE_OTHER): Payer: Medicare Other

## 2015-07-08 DIAGNOSIS — Z0000 Encounter for general adult medical examination without abnormal findings: Secondary | ICD-10-CM | POA: Diagnosis not present

## 2015-07-08 DIAGNOSIS — Z125 Encounter for screening for malignant neoplasm of prostate: Secondary | ICD-10-CM | POA: Diagnosis not present

## 2015-07-08 LAB — LIPID PANEL
Cholesterol: 190 mg/dL (ref 0–200)
HDL: 63.5 mg/dL (ref >39.00)
LDL Cholesterol: 108 mg/dL — ABNORMAL HIGH (ref 0–99)
NonHDL: 126.51
Total CHOL/HDL Ratio: 3
Triglycerides: 91 mg/dL (ref 0.0–149.0)
VLDL: 18.2 mg/dL (ref 0.0–40.0)

## 2015-07-08 LAB — CBC WITH DIFFERENTIAL/PLATELET
Basophils Absolute: 0 10*3/uL (ref 0.0–0.1)
Basophils Relative: 0.2 % (ref 0.0–3.0)
Eosinophils Absolute: 0.1 10*3/uL (ref 0.0–0.7)
Eosinophils Relative: 0.8 % (ref 0.0–5.0)
HCT: 38.6 % — ABNORMAL LOW (ref 39.0–52.0)
Hemoglobin: 12.6 g/dL — ABNORMAL LOW (ref 13.0–17.0)
Lymphocytes Relative: 73.8 % — ABNORMAL HIGH (ref 12.0–46.0)
Lymphs Abs: 12.3 10*3/uL — ABNORMAL HIGH (ref 0.7–4.0)
MCHC: 32.6 g/dL (ref 30.0–36.0)
MCV: 89.6 fl (ref 78.0–100.0)
Monocytes Absolute: 0.4 10*3/uL (ref 0.1–1.0)
Monocytes Relative: 2.2 % — ABNORMAL LOW (ref 3.0–12.0)
Neutro Abs: 3.8 10*3/uL (ref 1.4–7.7)
Neutrophils Relative %: 23 % — ABNORMAL LOW (ref 43.0–77.0)
Platelets: 174 10*3/uL (ref 150.0–400.0)
RBC: 4.3 Mil/uL (ref 4.22–5.81)
RDW: 17.3 % — ABNORMAL HIGH (ref 11.5–15.5)
WBC: 16.7 10*3/uL — ABNORMAL HIGH (ref 4.0–10.5)

## 2015-07-08 LAB — BASIC METABOLIC PANEL
BUN: 18 mg/dL (ref 6–23)
CO2: 26 mEq/L (ref 19–32)
Calcium: 9.6 mg/dL (ref 8.4–10.5)
Chloride: 108 mEq/L (ref 96–112)
Creatinine, Ser: 1.4 mg/dL (ref 0.40–1.50)
GFR: 62.5 mL/min (ref >60.00)
Glucose, Bld: 108 mg/dL — ABNORMAL HIGH (ref 70–99)
Potassium: 4.1 mEq/L (ref 3.5–5.1)
Sodium: 142 mEq/L (ref 135–145)

## 2015-07-08 LAB — MICROALBUMIN / CREATININE URINE RATIO
Creatinine,U: 94.6 mg/dL
Microalb Creat Ratio: 1.4 mg/g (ref 0.0–30.0)
Microalb, Ur: 1.3 mg/dL (ref 0.0–1.9)

## 2015-07-08 LAB — HEPATIC FUNCTION PANEL
ALT: 9 U/L (ref 0–53)
AST: 12 U/L (ref 0–37)
Albumin: 4 g/dL (ref 3.5–5.2)
Alkaline Phosphatase: 107 U/L (ref 39–117)
Bilirubin, Direct: 0.1 mg/dL (ref 0.0–0.3)
Total Bilirubin: 0.8 mg/dL (ref 0.2–1.2)
Total Protein: 6.1 g/dL (ref 6.0–8.3)

## 2015-07-08 LAB — POC URINALSYSI DIPSTICK (AUTOMATED)
Bilirubin, UA: NEGATIVE
Blood, UA: NEGATIVE
Glucose, UA: NEGATIVE
Ketones, UA: NEGATIVE
Leukocytes, UA: NEGATIVE
Nitrite, UA: NEGATIVE
Protein, UA: NEGATIVE
Spec Grav, UA: 1.015
Urobilinogen, UA: 0.2
pH, UA: 6

## 2015-07-08 LAB — HEMOGLOBIN A1C: Hgb A1c MFr Bld: 5.6 % (ref 4.6–6.5)

## 2015-07-08 LAB — TSH: TSH: 0.93 u[IU]/mL (ref 0.35–4.50)

## 2015-07-08 LAB — PSA: PSA: 0.51 ng/mL (ref 0.10–4.00)

## 2015-07-15 ENCOUNTER — Ambulatory Visit (INDEPENDENT_AMBULATORY_CARE_PROVIDER_SITE_OTHER): Payer: Medicare Other | Admitting: Family Medicine

## 2015-07-15 ENCOUNTER — Encounter: Payer: Self-pay | Admitting: Family Medicine

## 2015-07-15 VITALS — BP 122/74 | HR 88 | Temp 98.0°F | Ht 72.0 in | Wt 235.0 lb

## 2015-07-15 DIAGNOSIS — Z1211 Encounter for screening for malignant neoplasm of colon: Secondary | ICD-10-CM

## 2015-07-15 DIAGNOSIS — N400 Enlarged prostate without lower urinary tract symptoms: Secondary | ICD-10-CM

## 2015-07-15 DIAGNOSIS — R6889 Other general symptoms and signs: Secondary | ICD-10-CM | POA: Diagnosis not present

## 2015-07-15 DIAGNOSIS — Z0001 Encounter for general adult medical examination with abnormal findings: Secondary | ICD-10-CM

## 2015-07-15 MED ORDER — FINASTERIDE 5 MG PO TABS: 5.0000 mg | ORAL_TABLET | Freq: Every day | ORAL | Status: DC

## 2015-07-15 NOTE — Patient Instructions (Addendum)
Please have eye exam sent to [place] at [phone]. Sign release of information at the check out desk for this.   Stop by labs to pick up stool cards  Start finasteride to help shrink your prostate. Hopefully this can improve your symptoms by 6 month follow up- will not show benefit short term so need to take full 6 months  Finasteride (Proscar) tablets What is this medicine? FINASTERIDE (fi NAS teer ide) is used to treat benign prostatic hyperplasia (BPH) in men. This is a condition that causes you to have an enlarged prostate. This medicine helps to control your symptoms, decrease urinary retention, and reduces your risk of needing surgery. When used in combination with certain other medicines, this drug can slow down the progression of your disease. This medicine may be used for other purposes; ask your health care provider or pharmacist if you have questions. What should I tell my health care provider before I take this medicine? They need to know if you have any of these conditions: -liver disease -an unusual or allergic reaction to finasteride, other medicines, foods, dyes, or preservatives -pregnant or trying to get pregnant -breast-feeding How should I use this medicine? Take this medicine by mouth with a glass of water. Follow the directions on the prescription label. You can take this medicine with or without food. Take your doses at regular intervals. Do not take your medicine more often than directed. Do not stop taking except on the advice of your doctor or health care professional. Talk to your pediatrician regarding the use of this medicine in children. Special care may be needed. Overdosage: If you think you have taken too much of this medicine contact a poison control center or emergency room at once. NOTE: This medicine is only for you. Do not share this medicine with others. What if I miss a dose? If you miss a dose, take it as soon as you can. If it is almost time for your next  dose, take only that dose. Do not take double or extra doses. What may interact with this medicine? -saw palmetto or other dietary supplements This list may not describe all possible interactions. Give your health care provider a list of all the medicines, herbs, non-prescription drugs, or dietary supplements you use. Also tell them if you smoke, drink alcohol, or use illegal drugs. Some items may interact with your medicine. What should I watch for while using this medicine? Do not donate blood while you are taking this medicine. This will prevent giving this medicine to a pregnant male through a blood transfusion. Ask your doctor or health care professional when it is safe to donate blood after you stop taking this medicine. Women who are pregnant or may get pregnant must not handle broken or crushed finasteride tablets. The active ingredient could harm the unborn baby. If a pregnant woman comes into contact with broken or crushed tablets she should check with her doctor or health care professional. Exposure to whole tablets is not expected to cause harm as long as they are not swallowed. Contact your doctor or health care professional if your symptoms do not start to get better. You may need to take this medicine for 6 to 12 months to get the best results. This medicine can interfere with PSA laboratory tests for prostate cancer. If you are scheduled to have a lab test for prostate cancer, tell your doctor or health care professional that you are taking this medicine. This medicine may increase your risk  of getting some cancers, like breast cancer. Talk with your doctor. What side effects may I notice from receiving this medicine? Side effects that you should report to your doctor or health care professional as soon as possible: -any signs of an allergic reaction like rash, itching, hives or swelling of the lips or face -changes in breast like lumps, pain or fluids leaking from the nipple -pain in  the testicles Side effects that usually do not require medical attention (report to your doctor or health care professional if they continue or are bothersome): -sexual difficulties like decreased sexual desire or ability to get an erection -small amount of semen released during sex This list may not describe all possible side effects. Call your doctor for medical advice about side effects. You may report side effects to FDA at 1-800-FDA-1088. Where should I keep my medicine? Keep out of the reach of children. Store at room temperature below 30 degrees C (86 degrees F). Protect from light. Keep container tightly closed. Throw away any unused medicine after the expiration date. NOTE: This sheet is a summary. It may not cover all possible information. If you have questions about this medicine, talk to your doctor, pharmacist, or health care provider.    2016, Elsevier/Gold Standard. (2014-12-04 17:24:30)

## 2015-07-15 NOTE — Progress Notes (Signed)
Kevin Reddish, MD Phone: [phone]  Subjective:  Patient presents today for their annual physical. Chief complaint-noted.   See problem oriented charting- ROS- full  review of systems was completed and negative except for: chronic low back pain- to follow up with Dr. Brien Few for pain management. No chest pain or shortness of breath. No headache.   The following were reviewed and entered/updated in epic: Past Medical History  Diagnosis Date  . Diabetes mellitus type 2 with complications ([place]) 0000000  . Hyperlipidemia   . Hypertension   . Leukemia, chronic ([place]) 2001    does not take treatment just has blood levels checked once a year  . Glaucoma     left eye  . Gout   . Acute bronchitis 09/12/2007    Qualifier: Diagnosis of  By: [name] MD, [name]    Patient Active Problem List   Diagnosis Date Noted  . Osteomyelitis of lumbar vertebra (HCC)     Priority: High  . Chronic lymphocytic leukemia ([place]) 03/08/2007    Priority: High  . Diabetes mellitus type II, controlled ([place]) 03/08/2007    Priority: High  . Hyperlipidemia     Priority: Medium  . CKD (chronic kidney disease), stage III 01/13/2015    Priority: Medium  . Lumbar spinal stenosis 06/26/2014    Priority: Medium  . BPH (benign prostatic hyperplasia) 06/24/2014    Priority: Medium  . Gout 03/08/2007    Priority: Medium  . Essential hypertension 03/08/2007    Priority: Medium  . B12 deficiency 12/12/2008    Priority: Low  . Diabetic polyneuropathy ([place]) 12/12/2008    Priority: Low  . ERECTILE DYSFUNCTION 03/18/2008    Priority: Low  . Glaucoma 03/08/2007    Priority: Low  . Osteoarthritis 03/08/2007    Priority: Low  . Enteritis due to Clostridium difficile   . Discitis of lumbar region    Past Surgical History  Procedure Laterality Date  . Lumbar laminectomy  1997  . Hernia repair  1989  . Tonsillectomy    . Colonoscopy w/ polypectomy    . Dental surgery      implanted teeth x 2  . Anterior lat  lumbar fusion Left 01/03/2013    Procedure: ANTERIOR LATERAL LUMBAR FUSION lumbar three-four;  Surgeon: [name], MD;  Location: [place] [facility];  Service: Neurosurgery;  Laterality: Left;  . Lumbar percutaneous pedicle screw 1 level  01/03/2013    Procedure: LUMBAR PERCUTANEOUS PEDICLE SCREWS LUMBAR THREE-FOUR;  Surgeon: [name], MD;  Location: [facility];  Service: Neurosurgery;;  . Back surgery      lumbar fusion - 2016, feb  . Lumbar wound debridement N/A 07/23/2014    Procedure: LUMBAR WOUND DEBRIDEMENT;  Surgeon: [name], MD;  Location: [place] [facility];  Service: Neurosurgery;  Laterality: N/A;    Family History  Problem Relation Age of Onset  . Heart disease Mother     45, second hand  . Heart disease Father     45, former smoker    Medications- reviewed and updated Current Outpatient Prescriptions  Medication Sig Dispense Refill  . acetaminophen (TYLENOL) 500 MG tablet Take 500-1,000 mg by mouth every 6 (six) hours as needed (pain). Reported on 07/15/2015    . allopurinol (ZYLOPRIM) 300 MG tablet Take 1 tablet by mouth  daily 90 tablet 2  . glipiZIDE (GLUCOTROL XL) 5 MG 24 hr tablet TAKE 1 TABLET (5 MG TOTAL) BY MOUTH DAILY. 90 tablet 2  . glucose blood (ONETOUCH  VERIO) test strip Use to test blood sugars daily. Dx: E11.9 100 each 12  . losartan (COZAAR) 100 MG tablet Take 1 tablet by mouth  daily 90 tablet 2  . meloxicam (MOBIC) 15 MG tablet Take 1 tablet by mouth  daily 90 tablet 2  . ONE TOUCH LANCETS MISC Use to test blood sugars. Dx:E11.9 100 each 11  . tamsulosin (FLOMAX) 0.4 MG CAPS capsule Take 1 capsule by mouth  daily 90 capsule 2  . TRAVATAN Z 0.004 % SOLN ophthalmic solution Place 1 drop into both eyes at bedtime.     . vitamin B-12 (CYANOCOBALAMIN) 1000 MCG tablet Take 1,000 mcg by mouth daily.       Allergies-reviewed and updated No Known Allergies  Social History   Social History  . Marital Status: Widowed    Spouse Name: N/A  . Number of  Children: N/A  . Years of Education: N/A   Social History Main Topics  . Smoking status: Former Smoker -- 0.25 packs/day for 20 years    Types: Cigarettes    Quit date: 09/28/1995  . Smokeless tobacco: Never Used  . Alcohol Use: 0.6 oz/week    1 Glasses of wine per week     Comment: monthy  . Drug Use: No  . Sexual Activity: Yes   Other Topics Concern  . None   Social History Narrative   Widowed 2009. 2 sons. 2 grandkids-boy/girl. No greatgrandkids.    Single but dating-same person for a while, may get married again   Lives alone. Does everything for himself except cutting grass if back is hurting.       Retired from [employer]: golf (hard with back), watch tv (pain with moving around)    ROS--See HPI   Objective: BP 122/74 mmHg  Pulse 88  Temp(Src) 98 F (36.7 C)  Ht 6' (1.829 m)  Wt 235 lb (106.595 kg)  BMI 31.86 kg/m2 Gen: NAD, resting comfortably HEENT: Mucous membranes are moist. Oropharynx normal Neck: no thyromegaly CV: RRR no murmurs rubs or gallops Lungs: CTAB no crackles, wheeze, rhonchi Abdomen: soft/nontender/nondistended/normal bowel sounds. No rebound or guarding.  Rectal: normal tone, diffusely enlarged prostate, no masses or tenderness Ext: no edema Skin: warm, dry Neuro: grossly normal, moves all extremities, PERRLA, walks with walker rolling  Diabetic Foot Exam - Simple   Simple Foot Form  Diabetic Foot exam was performed with the following findings:  Yes 07/15/2015  8:42 AM  Visual Inspection  No deformities, no ulcerations, no other skin breakdown bilaterally:  Yes  Sensation Testing  Intact to touch and monofilament testing bilaterally:  Yes  Pulse Check  Posterior Tibialis and Dorsalis pulse intact bilaterally:  Yes  Comments     Assessment/Plan:  82 y.o. male presenting for annual physical.  Health Maintenance counseling: 1. Anticipatory guidance: Patient counseled regarding regular dental exams, eye exams, wearing  seatbelts.  2. Risk factor reduction:  Advised patient of need for regular exercise-trying 2x a week- and diet rich and fruits and vegetables to reduce risk of heart attack and stroke.  3. Immunizations/screenings/ancillary studies Health Maintenance Due  Topic Date Due  . OPHTHALMOLOGY EXAM - need copy 09/18/2014  . FOOT EXAM - today 06/25/2015   4. Prostate cancer screening- low risk based off PSA, does have BPH on exam- see comments below  Lab Results  Component Value Date   PSA 0.51 07/08/2015   PSA 0.66 08/05/2013   PSA 0.80 07/23/2012  5. Colon cancer screening - stool cards to 85  DM- controlled on glipizide 5 mg 24 hr. Hypoglycemia: none. Lowest 100 in AM.  Lab Results  Component Value Date   HGBA1C 5.6 07/08/2015   CLL- follows with oncology yearly, doing well  Hyperlipidemia- no rx for primary prevention- mild only  Gout controlled on allopurinol alone  Hyprtension controlled on 1/2 tab of 100mg  losartan  CKD III- GFR actually just over 60- monitoring especially with mobic  Lumbar stenosisand history osteomyelitis lumbar spine- off antibiotics. Chronic pain- to follow up with Dr. Brien Few   BPH (benign prostatic hyperplasia) Add finasteride to flomax. Nocturia 2 a night and stream weakening.    Return in about 6 months (around 01/15/2016) for follow up- or sooner if needed. Return precautions advised.   Orders Placed This Encounter  Procedures  . POC Hemoccult Bld/Stl (3-Cd Home Screen)    Send home    Standing Status: Future     Number of Occurrences:      Standing Expiration Date: 07/14/2016    Meds ordered this encounter  Medications  . finasteride (PROSCAR) 5 MG tablet    Sig: Take 1 tablet (5 mg total) by mouth daily.    Dispense:  90 tablet    Refill:  3

## 2015-07-15 NOTE — Assessment & Plan Note (Signed)
Add finasteride to flomax. Nocturia 2 a night and stream weakening.

## 2015-07-16 ENCOUNTER — Encounter: Payer: Self-pay | Admitting: Family Medicine

## 2015-07-20 ENCOUNTER — Ambulatory Visit: Payer: Medicare Other | Attending: Infectious Diseases

## 2015-07-20 DIAGNOSIS — R262 Difficulty in walking, not elsewhere classified: Secondary | ICD-10-CM | POA: Diagnosis not present

## 2015-07-20 DIAGNOSIS — R531 Weakness: Secondary | ICD-10-CM | POA: Diagnosis not present

## 2015-07-20 DIAGNOSIS — Z981 Arthrodesis status: Secondary | ICD-10-CM | POA: Diagnosis not present

## 2015-07-20 DIAGNOSIS — M6281 Muscle weakness (generalized): Secondary | ICD-10-CM | POA: Diagnosis not present

## 2015-07-20 DIAGNOSIS — M545 Low back pain, unspecified: Secondary | ICD-10-CM

## 2015-07-20 DIAGNOSIS — Z7409 Other reduced mobility: Secondary | ICD-10-CM

## 2015-07-21 NOTE — Therapy (Signed)
[place], [place], [address] Phone: [phone]   Fax:  [phone]  Physical Therapy Evaluation  Patient Details  Name: Duwayne Bohland MRN: [id number] Date of Birth: [birth year] Referring Provider: Verlin Grills  Encounter Date: 07/20/2015      PT End of Session - 07/21/15 1556    Visit Number 1   Number of Visits 24   Date for PT Re-Evaluation 09/14/15   PT Start Time 1330   PT Stop Time 1421   PT Time Calculation (min) 51 min   Activity Tolerance Patient tolerated treatment well   Behavior During Therapy [facility] for tasks assessed/performed      Past Medical History  Diagnosis Date  . Diabetes mellitus type 2 with complications ([place]) 0000000  . Hyperlipidemia   . Hypertension   . Leukemia, chronic ([place]) 2001    does not take treatment just has blood levels checked once a year  . Glaucoma     left eye  . Gout   . Acute bronchitis 09/12/2007    Qualifier: Diagnosis of  By: [name] MD, [name]     Past Surgical History  Procedure Laterality Date  . Lumbar laminectomy  1997  . Hernia repair  1989  . Tonsillectomy    . Colonoscopy w/ polypectomy    . Dental surgery      implanted teeth x 2  . Anterior lat lumbar fusion Left 01/03/2013    Procedure: ANTERIOR LATERAL LUMBAR FUSION lumbar three-four;  Surgeon: [name], MD;  Location: [place] [facility];  Service: Neurosurgery;  Laterality: Left;  . Lumbar percutaneous pedicle screw 1 level  01/03/2013    Procedure: LUMBAR PERCUTANEOUS PEDICLE SCREWS LUMBAR THREE-FOUR;  Surgeon: [name], MD;  Location: [facility];  Service: Neurosurgery;;  . Back surgery      lumbar fusion - 2016, feb  . Lumbar wound debridement N/A 07/23/2014    Procedure: LUMBAR WOUND DEBRIDEMENT;  Surgeon: [name], MD;  Location: [place] [facility];  Service: Neurosurgery;  Laterality: N/A;    There were no vitals filed for this visit.  Visit Diagnosis:  Midline low back  pain without sciatica  S/P lumbar spinal fusion  Difficulty in walking  Impaired mobility and ADLs  Weakness      Subjective Assessment - 07/20/15 1342    Subjective Pt has long history of back pain and surgeries x 4, consisting of fusions. Last surgery, caused infection, dehicense, required debridement. No significant improvement after surgery noted.  Pt needs to lie propped to 20 degrees ( unable to lie supine) due to lumbar flexion.    Pertinent History DM, HTN, OA, osteomyelitis, CKD, stenosis, laminectomy (1997), fusion (2014, 2016 x 2).    How long can you sit comfortably? not limited    How long can you stand comfortably? unable to stand unsupport since 2016.  Supported with RW can stand for 5 mins    How long can you walk comfortably? Can walk for 10 mins with RW.   Patient Stated Goals want to walk with a cane. want to have less pain.    Currently in Pain? Yes   Pain Score 0-No pain  No pain sitting at rest, standing and walking 3/10    Pain Location Back   Pain Orientation Lower;Left   Pain Descriptors / Indicators Aching;Sharp   Pain Type Chronic pain   Pain Onset More than a month ago   Pain Frequency Intermittent   Aggravating  Factors  waling and standing.    Pain Relieving Factors sitting, lying down, medication    Effect of Pain on Daily Activities difficulty with walking, stairs in home, stairs into and out of home                                PT Education - 07/21/15 0809    Education provided Yes   Education Details PT POC. Use of RW at all times.    Person(s) Educated Patient   Methods Explanation;Demonstration   Comprehension Verbalized understanding          PT Short Term Goals - 07/21/15 1557    PT SHORT TERM GOAL #1   Title Pt will be independent with initial HEP for continued strengthening and mobility by 08/21/15    Time 4   Period Weeks   Status New   PT SHORT TERM GOAL #2   Title Lumbar extension AROM improved to  -10 degrees and pain - free by 08/21/15.     Time 4   Period Weeks   Status New   PT SHORT TERM GOAL #3   Title Pt will be able to demo posture and body mechanics as it relates to lumbar spine by 08/21/15.    Time 4   Period Weeks   Status New           PT Long Term Goals - 07/21/15 1600    PT LONG TERM GOAL #1   Title FOTO score will improve from 50% limited to 45% to demo improved function and mobility by 09/15/15.     Time 8   Period Weeks   Status New   PT LONG TERM GOAL #2   Title H/S flexibility 90/90 measures will improve by at least 10 degrees bilat, to -35 on R and -40 on L, in order for pt to tolerate standing for 1 hour pain- free by 09/15/15.   Time 8   Period Weeks   Status New   PT LONG TERM GOAL #3   Title Pt will tolerate standing and walking for 20 mins with pain < 3/10 by 09/15/15.    Time 8   Period Weeks   Status New   PT LONG TERM GOAL #4   Title Bilat hip ABD strength will improve to 3/5 in order to stand unsupported in order to perform daily ADLs and grooming by 09/15/15.    Time 8   Period Weeks   Status New               Plan - 07/21/15 0755    Clinical Impression Statement Pt presents for moderate complexity evaluation for back pain following long standing history of back pain, laminectomy in 1997, lumbar fusions in 2014, 2016, and wound dehicense in 2016. Pt presents with impairments including abnormal posture, core weakness,  pain, impaired mobility and flexibility, and impaired strength, which limit pt's functional abilities with sitting, standing, walking, bending, lifting. Pt will benefit from oupt PT for 3 times a week for 8 weeks for core strengthening, stretching, pt education in order to address these impairments and functional limitations and return pt to maximal level of funtion.    Pt will benefit from skilled therapeutic intervention in order to improve on the following deficits Abnormal gait;Decreased activity tolerance;Decreased  balance;Decreased mobility;Decreased strength;Hypomobility;Decreased safety awareness;Decreased endurance;Decreased range of motion;Difficulty walking;Pain;Increased muscle spasms;Improper body mechanics;Postural dysfunction   Rehab Potential Fair  Clinical Impairments Affecting Rehab Potential chronic nature of LBP   PT Frequency 3x / week   PT Duration 8 weeks   PT Treatment/Interventions ADLs/Self Care Home Management;Cryotherapy;Electrical Stimulation;Moist Heat;Gait training;Stair training;Neuromuscular re-education;Patient/family education;Functional mobility training;Therapeutic activities;Therapeutic exercise;Balance training;Manual techniques;Dry needling;Passive range of motion;Taping;DME Instruction  NO TRACTION - lumbar fusion    PT Next Visit Plan BERG Test. Ankle DF AROM measures. Est HEP: SKTC, Piriformis stretch, HS stretch, quad stretch. Core strengthening.     PT Home Exercise Plan none established    Consulted and Agree with Plan of Care Patient          G-Codes - 08-19-2015 1609    Functional Limitation Mobility: Walking and moving around   Mobility: Walking and Moving Around Current Status [phone]) At least 40 percent but less than 60 percent impaired, limited or restricted   Mobility: Walking and Moving Around Goal Status [phone]) At least 20 percent but less than 40 percent impaired, limited or restricted       Problem List Patient Active Problem List   Diagnosis Date Noted  . Enteritis due to Clostridium difficile   . Discitis of lumbar region   . Osteomyelitis of lumbar vertebra ([place])   . Hyperlipidemia   . CKD (chronic kidney disease), stage III 01/13/2015  . Lumbar spinal stenosis 06/26/2014  . BPH (benign prostatic hyperplasia) 06/24/2014  . B12 deficiency 12/12/2008  . Diabetic polyneuropathy ([place]) 12/12/2008  . ERECTILE DYSFUNCTION 03/18/2008  . Chronic lymphocytic leukemia ([place]) 03/08/2007  . Diabetes mellitus type II, controlled ([place]) 03/08/2007  .  Gout 03/08/2007  . Glaucoma 03/08/2007  . Essential hypertension 03/08/2007  . Osteoarthritis 03/08/2007    Dollene Cleveland , PT  08-19-15, 4:11 PM  [facility] [address] [place], [place], [address] Phone: [phone]   Fax:  [phone]  Name: Godric Yatsko MRN: [id number] Date of Birth: [birth year]

## 2015-07-27 ENCOUNTER — Ambulatory Visit: Payer: Medicare Other | Admitting: Physical Therapy

## 2015-07-27 DIAGNOSIS — Z981 Arthrodesis status: Secondary | ICD-10-CM

## 2015-07-27 DIAGNOSIS — R531 Weakness: Secondary | ICD-10-CM

## 2015-07-27 DIAGNOSIS — R262 Difficulty in walking, not elsewhere classified: Secondary | ICD-10-CM

## 2015-07-27 DIAGNOSIS — M545 Low back pain, unspecified: Secondary | ICD-10-CM

## 2015-07-27 DIAGNOSIS — Z7409 Other reduced mobility: Secondary | ICD-10-CM

## 2015-07-27 DIAGNOSIS — M6281 Muscle weakness (generalized): Secondary | ICD-10-CM | POA: Diagnosis not present

## 2015-07-27 NOTE — Therapy (Signed)
[place], [place], [address] Phone: [phone]   Fax:  [phone]  Physical Therapy Treatment  Patient Details  Name: Kevin Rogers MRN: [id number] Date of Birth: Oct 14, 1931 Referring Provider: Verlin Grills  Encounter Date: 07/27/2015      PT End of Session - 07/27/15 1455    Visit Number 2   Number of Visits 24   Date for PT Re-Evaluation 09/14/15   PT Start Time 0215   PT Stop Time 0300   PT Time Calculation (min) 45 min      Past Medical History  Diagnosis Date  . Diabetes mellitus type 2 with complications ([place]) 0000000  . Hyperlipidemia   . Hypertension   . Leukemia, chronic ([place]) 2001    does not take treatment just has blood levels checked once a year  . Glaucoma     left eye  . Gout   . Acute bronchitis 09/12/2007    Qualifier: Diagnosis of  By: [name] MD, [name]     Past Surgical History  Procedure Laterality Date  . Lumbar laminectomy  1997  . Hernia repair  1989  . Tonsillectomy    . Colonoscopy w/ polypectomy    . Dental surgery      implanted teeth x 2  . Anterior lat lumbar fusion Left 01/03/2013    Procedure: ANTERIOR LATERAL LUMBAR FUSION lumbar three-four;  Surgeon: [name], MD;  Location: [place] [facility];  Service: Neurosurgery;  Laterality: Left;  . Lumbar percutaneous pedicle screw 1 level  01/03/2013    Procedure: LUMBAR PERCUTANEOUS PEDICLE SCREWS LUMBAR THREE-FOUR;  Surgeon: [name], MD;  Location: [facility];  Service: Neurosurgery;;  . Back surgery      lumbar fusion - 2016, feb  . Lumbar wound debridement N/A 07/23/2014    Procedure: LUMBAR WOUND DEBRIDEMENT;  Surgeon: [name], MD;  Location: [place] [facility];  Service: Neurosurgery;  Laterality: N/A;    There were no vitals filed for this visit.  Visit Diagnosis:  Midline low back pain without sciatica  S/P lumbar spinal fusion  Difficulty in walking  Impaired mobility and  ADLs  Weakness      Subjective Assessment - 07/27/15 1422    Currently in Pain? Yes  none at rest   Pain Score 3    Pain Location Back   Pain Descriptors / Indicators --  piercing   Aggravating Factors  walking and standing   Pain Relieving Factors sitting, lying, meds            [facility] PT Assessment - 07/27/15 0001    AROM   AROM Assessment Site Ankle   Right/Left Ankle Right;Left   Right Ankle Dorsiflexion -2   Left Ankle Dorsiflexion -10   Standardized Balance Assessment   Standardized Balance Assessment Berg Balance Test   Berg Balance Test   Sit to Stand Able to stand  independently using hands   Standing Unsupported Able to stand safely 2 minutes   Sitting with Back Unsupported but Feet Supported on Floor or Stool Able to sit safely and securely 2 minutes   Stand to Sit Sits safely with minimal use of hands   Transfers Able to transfer safely, definite need of hands   Standing Unsupported with Eyes Closed Able to stand 10 seconds safely   Standing Ubsupported with Feet Together Able to place feet together independently and stand for 1 minute with supervision   From Standing, Reach Forward with  Outstretched Arm Can reach forward >12 cm safely (5")   From Standing Position, Pick up Object from [place] to pick up shoe, needs supervision   From Standing Position, Turn to Look Behind Over each Shoulder Looks behind one side only/other side shows less weight shift   Turn 360 Degrees Needs close supervision or verbal cueing   Standing Unsupported, Alternately Place Feet on Step/Stool Able to stand independently and complete 8 steps >20 seconds   Standing Unsupported, One Foot in Front Able to plae foot ahead of the other independently and hold 30 seconds   Standing on One Leg Tries to lift leg/unable to hold 3 seconds but remains standing independently   Total Score 42                     [facility] Adult PT Treatment/Exercise - 07/27/15 0001    Exercises    Exercises Lumbar   Lumbar Exercises: Stretches   Active Hamstring Stretch 3 reps;30 seconds   Active Hamstring Stretch Limitations with strap   Single Knee to Chest Stretch 3 reps;30 seconds   Single Knee to Chest Stretch Limitations cues for pain free ROM   Lower Trunk Rotation 3 reps;30 seconds   Quad Stretch --   Piriformis Stretch 3 reps;30 seconds   Piriformis Stretch Limitations knee to opposite shoulder                PT Education - 07/27/15 1454    Education provided Yes   Education Details Stretching HEP: SKTC, hamstring, knee to opposite shoulder, LTR   Person(s) Educated Patient   Methods Explanation;Handout   Comprehension Verbalized understanding          PT Short Term Goals - 07/21/15 1557    PT SHORT TERM GOAL #1   Title Pt will be independent with initial HEP for continued strengthening and mobility by 08/21/15    Time 4   Period Weeks   Status New   PT SHORT TERM GOAL #2   Title Lumbar extension AROM improved to -10 degrees and pain - free by 08/21/15.     Time 4   Period Weeks   Status New   PT SHORT TERM GOAL #3   Title Pt will be able to demo posture and body mechanics as it relates to lumbar spine by 08/21/15.    Time 4   Period Weeks   Status New           PT Long Term Goals - 07/21/15 1600    PT LONG TERM GOAL #1   Title FOTO score will improve from 50% limited to 45% to demo improved function and mobility by 09/15/15.     Time 8   Period Weeks   Status New   PT LONG TERM GOAL #2   Title H/S flexibility 90/90 measures will improve by at least 10 degrees bilat, to -35 on R and -40 on L, in order for pt to tolerate standing for 1 hour pain- free by 09/15/15.   Time 8   Period Weeks   Status New   PT LONG TERM GOAL #3   Title Pt will tolerate standing and walking for 20 mins with pain < 3/10 by 09/15/15.    Time 8   Period Weeks   Status New   PT LONG TERM GOAL #4   Title Bilat hip ABD strength will improve to 3/5 in order to stand  unsupported in order to perform daily ADLs and  grooming by 09/15/15.    Time 8   Period Weeks   Status New               Plan - 07/27/15 1647    Clinical Impression Statement BERG 42/56. AROM DF lacks neutral bilateral. Instructed pt in trunk stretching and gave HEP. No increased pain post treatment.    PT Next Visit Plan review HEP, add core strength, balance        Problem List Patient Active Problem List   Diagnosis Date Noted  . Enteritis due to Clostridium difficile   . Discitis of lumbar region   . Osteomyelitis of lumbar vertebra ([place])   . Hyperlipidemia   . CKD (chronic kidney disease), stage III 01/13/2015  . Lumbar spinal stenosis 06/26/2014  . BPH (benign prostatic hyperplasia) 06/24/2014  . B12 deficiency 12/12/2008  . Diabetic polyneuropathy ([place]) 12/12/2008  . ERECTILE DYSFUNCTION 03/18/2008  . Chronic lymphocytic leukemia ([place]) 03/08/2007  . Diabetes mellitus type II, controlled ([place]) 03/08/2007  . Gout 03/08/2007  . Glaucoma 03/08/2007  . Essential hypertension 03/08/2007  . Osteoarthritis 03/08/2007    Dorene Ar , PTA  07/27/2015, 4:49 PM  [facility] [address] [place], [place], [address] Phone: [phone]   Fax:  [phone]  Name: Kevin Rogers MRN: [id number] Date of Birth: 03-Nov-1931

## 2015-07-29 ENCOUNTER — Ambulatory Visit: Payer: Medicare Other

## 2015-07-29 DIAGNOSIS — R531 Weakness: Secondary | ICD-10-CM

## 2015-07-29 DIAGNOSIS — Z981 Arthrodesis status: Secondary | ICD-10-CM

## 2015-07-29 DIAGNOSIS — Z7409 Other reduced mobility: Secondary | ICD-10-CM | POA: Diagnosis not present

## 2015-07-29 DIAGNOSIS — R262 Difficulty in walking, not elsewhere classified: Secondary | ICD-10-CM

## 2015-07-29 DIAGNOSIS — M545 Low back pain, unspecified: Secondary | ICD-10-CM

## 2015-07-29 DIAGNOSIS — M6281 Muscle weakness (generalized): Secondary | ICD-10-CM | POA: Diagnosis not present

## 2015-07-29 NOTE — Therapy (Signed)
[place], [place], [address] Phone: [phone]   Fax:  [phone]  Physical Therapy Treatment  Patient Details  Name: Kevin Rogers MRN: [id number] Date of Birth: 1931-10-26 Referring Provider: Verlin Grills  Encounter Date: 07/29/2015      PT End of Session - 07/29/15 1147    Visit Number 3   Number of Visits 24   Date for PT Re-Evaluation 09/14/15   PT Start Time 1100   PT Stop Time 1145   PT Time Calculation (min) 45 min   Activity Tolerance Patient tolerated treatment well;No increased pain   Behavior During Therapy [facility] for tasks assessed/performed      Past Medical History  Diagnosis Date  . Diabetes mellitus type 2 with complications ([place]) 0000000  . Hyperlipidemia   . Hypertension   . Leukemia, chronic ([place]) 2001    does not take treatment just has blood levels checked once a year  . Glaucoma     left eye  . Gout   . Acute bronchitis 09/12/2007    Qualifier: Diagnosis of  By: [name] MD, [name]     Past Surgical History  Procedure Laterality Date  . Lumbar laminectomy  1997  . Hernia repair  1989  . Tonsillectomy    . Colonoscopy w/ polypectomy    . Dental surgery      implanted teeth x 2  . Anterior lat lumbar fusion Left 01/03/2013    Procedure: ANTERIOR LATERAL LUMBAR FUSION lumbar three-four;  Surgeon: [name], MD;  Location: [place] [facility];  Service: Neurosurgery;  Laterality: Left;  . Lumbar percutaneous pedicle screw 1 level  01/03/2013    Procedure: LUMBAR PERCUTANEOUS PEDICLE SCREWS LUMBAR THREE-FOUR;  Surgeon: [name], MD;  Location: [facility];  Service: Neurosurgery;;  . Back surgery      lumbar fusion - 2016, feb  . Lumbar wound debridement N/A 07/23/2014    Procedure: LUMBAR WOUND DEBRIDEMENT;  Surgeon: [name], MD;  Location: [place] [facility];  Service: Neurosurgery;  Laterality: N/A;    There were no vitals filed for this visit.  Visit Diagnosis:   Midline low back pain without sciatica  S/P lumbar spinal fusion  Difficulty in walking  Impaired mobility and ADLs  Weakness      Subjective Assessment - 07/29/15 1113    Subjective He has 3/10 pain . He reports walking with walker a year and 2 canes 6 months after that. He reports doing HEP 1-2x/day   Currently in Pain? Yes   Pain Score 3    Pain Location Back   Pain Orientation Lower;Left   Multiple Pain Sites No                         [facility] Adult PT Treatment/Exercise - 07/29/15 1114    Lumbar Exercises: Aerobic   Stationary Bike Nustep L5 LE only 6 min   Lumbar Exercises: Standing   Other Standing Lumbar Exercises with walker practices lifting one leg with same side arm off walker  x 8 RT and LT  then LT leg stand RT foot back on toes with  isolated glut med squeezes to shift weight ove LT foot    Other Standing Lumbar Exercises Started in standing but had to sit to DF and PF due to wweakness then did  15 reps in sitting   Lumbar Exercises: Seated   Sit to Stand 10 reps  Sit to Stand Limitations at 24 inch height   Lumbar Exercises: Supine   Ab Set 10 reps   Glut Set 10 reps   Glut Set Limitations done with ab set with scooping pelvis, verbal and visual cues given   Clam 10 reps  RT and Lt   Clam Limitations with cues for abdominal set with movement of each leg x 10   Bent Knee Raise 10 reps   Bent Knee Raise Limitations RT /LT with cues for abdominal setting   Other Supine Lumbar Exercises isometric ball squeeze x 12 reps with ab set    Lumbar Exercises: Sidelying   Hip Abduction 10 reps  RT and LT                   PT Short Term Goals - 07/21/15 1557    PT SHORT TERM GOAL #1   Title Pt will be independent with initial HEP for continued strengthening and mobility by 08/21/15    Time 4   Period Weeks   Status New   PT SHORT TERM GOAL #2   Title Lumbar extension AROM improved to -10 degrees and pain - free by 08/21/15.     Time 4    Period Weeks   Status New   PT SHORT TERM GOAL #3   Title Pt will be able to demo posture and body mechanics as it relates to lumbar spine by 08/21/15.    Time 4   Period Weeks   Status New           PT Long Term Goals - 07/21/15 1600    PT LONG TERM GOAL #1   Title FOTO score will improve from 50% limited to 45% to demo improved function and mobility by 09/15/15.     Time 8   Period Weeks   Status New   PT LONG TERM GOAL #2   Title H/S flexibility 90/90 measures will improve by at least 10 degrees bilat, to -35 on R and -40 on L, in order for pt to tolerate standing for 1 hour pain- free by 09/15/15.   Time 8   Period Weeks   Status New   PT LONG TERM GOAL #3   Title Pt will tolerate standing and walking for 20 mins with pain < 3/10 by 09/15/15.    Time 8   Period Weeks   Status New   PT LONG TERM GOAL #4   Title Bilat hip ABD strength will improve to 3/5 in order to stand unsupported in order to perform daily ADLs and grooming by 09/15/15.    Time 8   Period Weeks   Status New               Plan - 07/29/15 1147    Clinical Impression Statement He tolerated all exercise but weakness limited what type of exercise he could do and he has much less strength/stability on LT leg   PT Next Visit Plan Continue core/LE strength   Consulted and Agree with Plan of Care Patient        Problem List Patient Active Problem List   Diagnosis Date Noted  . Enteritis due to Clostridium difficile   . Discitis of lumbar region   . Osteomyelitis of lumbar vertebra ([place])   . Hyperlipidemia   . CKD (chronic kidney disease), stage III 01/13/2015  . Lumbar spinal stenosis 06/26/2014  . BPH (benign prostatic hyperplasia) 06/24/2014  . B12 deficiency 12/12/2008  . Diabetic  polyneuropathy ([place]) 12/12/2008  . ERECTILE DYSFUNCTION 03/18/2008  . Chronic lymphocytic leukemia ([place]) 03/08/2007  . Diabetes mellitus type II, controlled ([place]) 03/08/2007  . Gout 03/08/2007  . Glaucoma  03/08/2007  . Essential hypertension 03/08/2007  . Osteoarthritis 03/08/2007    Darrel Hoover PT 07/29/2015, 11:49 AM  [facility] [address] [place], [place], [address] Phone: [phone]   Fax:  [phone]  Name: Kevin Rogers MRN: [id number] Date of Birth: 01-07-1932

## 2015-07-31 ENCOUNTER — Ambulatory Visit: Payer: Medicare Other

## 2015-07-31 DIAGNOSIS — Z981 Arthrodesis status: Secondary | ICD-10-CM

## 2015-07-31 DIAGNOSIS — R262 Difficulty in walking, not elsewhere classified: Secondary | ICD-10-CM | POA: Diagnosis not present

## 2015-07-31 DIAGNOSIS — M545 Low back pain, unspecified: Secondary | ICD-10-CM

## 2015-07-31 DIAGNOSIS — M6281 Muscle weakness (generalized): Secondary | ICD-10-CM

## 2015-07-31 DIAGNOSIS — Z7409 Other reduced mobility: Secondary | ICD-10-CM | POA: Diagnosis not present

## 2015-07-31 DIAGNOSIS — R531 Weakness: Secondary | ICD-10-CM | POA: Diagnosis not present

## 2015-07-31 NOTE — Therapy (Signed)
Kevin Rogers, [place], [address] Phone: [phone]   Fax:  [phone]  Physical Therapy Treatment  Patient Details  Name: Kevin Rogers MRN: [id number] Date of Birth: 24-Aug-1931 Referring Provider: Verlin Rogers  Encounter Date: 07/31/2015      PT End of Session - 07/31/15 1025    Visit Number 4   Number of Visits 24   Date for PT Re-Evaluation 09/14/15   Authorization Type [facility] [employer]   Authorization Time Period No limit   PT Start Time 1020   PT Stop Time 1105   PT Time Calculation (min) 45 min   Activity Tolerance Patient tolerated treatment well   Behavior During Therapy [facility] for tasks assessed/performed      Past Medical History  Diagnosis Date  . Diabetes mellitus type 2 with complications ([place]) 0000000  . Hyperlipidemia   . Hypertension   . Leukemia, chronic ([place]) 2001    does not take treatment just has blood levels checked once a year  . Glaucoma     left eye  . Gout   . Acute bronchitis 09/12/2007    Qualifier: Diagnosis of  By: Kevin Morale MD, Kevin Rogers     Past Surgical History  Procedure Laterality Date  . Lumbar laminectomy  1997  . Hernia repair  1989  . Tonsillectomy    . Colonoscopy w/ polypectomy    . Dental surgery      implanted teeth x 2  . Anterior lat lumbar fusion Left 01/03/2013    Procedure: ANTERIOR LATERAL LUMBAR FUSION lumbar three-four;  Surgeon: Kevin Ghee, MD;  Location: [place] [facility];  Service: Neurosurgery;  Laterality: Left;  . Lumbar percutaneous pedicle screw 1 level  01/03/2013    Procedure: LUMBAR PERCUTANEOUS PEDICLE SCREWS LUMBAR THREE-FOUR;  Surgeon: Kevin Ghee, MD;  Location: [facility];  Service: Neurosurgery;;  . Back surgery      lumbar fusion - 2016, feb  . Lumbar wound debridement N/A 07/23/2014    Procedure: LUMBAR WOUND DEBRIDEMENT;  Surgeon: Kevin Chimera, MD;  Location: [place] [facility];  Service: Neurosurgery;  Laterality: N/A;    There  were no vitals filed for this visit.  Visit Diagnosis:  Midline low back pain without sciatica  S/P lumbar spinal fusion  Muscle weakness (generalized)      Subjective Assessment - 07/31/15 1024    Subjective PAin same . No changes   Currently in Pain? Yes   Pain Score 3    Pain Location Back   Pain Orientation Left;Lower            [facility] PT Assessment - 07/31/15 0001    Strength   Right Knee Flexion 4-/5   Right Knee Extension 4+/5   Left Knee Flexion 4+/5   Left Knee Extension 4+/5   Right Ankle Dorsiflexion 4-/5   Left Ankle Dorsiflexion 2/5                     [facility] Adult PT Treatment/Exercise - 07/31/15 1026    Neuro Re-ed    Neuro Re-ed Details  Step stand RT and LT in front  wit turning trunk RT and LT part range with CG for balance /standing tolerance    Lumbar Exercises: Aerobic   Stationary Bike Nustep L5 LE only 6 min   Lumbar Exercises: Standing   Other Standing Lumbar Exercises Stand facing wall with reach and lift same leg x 10 with weight shift RT and  Lt.  2 sets   Lumbar Exercises: Seated   Sit to Stand 10 reps   Sit to Stand Limitations at 24 inch height   Lumbar Exercises: Supine   Heel Slides 10 reps  RT and LT   Bent Knee Raise 10 reps   Bent Knee Raise Limitations RT /LT with cues for abdominal setting   Bridge 10 reps   Other Supine Lumbar Exercises rolling RT and LT x 5 . He had more trouble rolling To LT than RT.    Lumbar Exercises: Sidelying   Clam 10 reps   Clam Limitations RT and LT cued for limiting pelvic alignment   Hip Abduction 10 reps  RT and LT   Other Sidelying Lumbar Exercises reverse  clam RT and LT  x12                PT Education - 07/31/15 1236    Education provided Yes   Education Details sit to stand with emphasis on using gluteals to drive hips up and forward   Person(s) Educated Patient;Spouse   Methods Explanation;Demonstration   Comprehension Returned demonstration;Verbalized  understanding          PT Short Term Goals - 07/31/15 1238    PT SHORT TERM GOAL #1   Title Pt will be independent with initial HEP for continued strengthening and mobility by 08/21/15    Status On-going   PT SHORT TERM GOAL #2   Title Lumbar extension AROM improved to -10 degrees and pain - free by 08/21/15.     Status On-going   PT SHORT TERM GOAL #3   Title Pt will be able to demo posture and body mechanics as it relates to lumbar spine by 08/21/15.    Status On-going           PT Long Term Goals - 07/21/15 1600    PT LONG TERM GOAL #1   Title FOTO score will improve from 50% limited to 45% to demo improved function and mobility by 09/15/15.     Time 8   Period Weeks   Status New   PT LONG TERM GOAL #2   Title H/S flexibility 90/90 measures will improve by at least 10 degrees bilat, to -35 on R and -40 on L, in order for pt to tolerate standing for 1 hour pain- free by 09/15/15.   Time 8   Period Weeks   Status New   PT LONG TERM GOAL #3   Title Pt will tolerate standing and walking for 20 mins with pain < 3/10 by 09/15/15.    Time 8   Period Weeks   Status New   PT LONG TERM GOAL #4   Title Bilat hip ABD strength will improve to 3/5 in order to stand unsupported in order to perform daily ADLs and grooming by 09/15/15.    Time 8   Period Weeks   Status New               Plan - 07/31/15 1237    Clinical Impression Statement [name] continues to work hard but weakness in LE makes mobility without walker unsafe for now.    PT Next Visit Plan Continue core/LE strength. Start HEP mat exercises   Consulted and Agree with Plan of Care Patient        Problem List Patient Active Problem List   Diagnosis Date Noted  . Enteritis due to Clostridium difficile   . Discitis of lumbar region   .  Osteomyelitis of lumbar vertebra ([place])   . Hyperlipidemia   . CKD (chronic kidney disease), stage III 01/13/2015  . Lumbar spinal stenosis 06/26/2014  . BPH (benign  prostatic hyperplasia) 06/24/2014  . B12 deficiency 12/12/2008  . Diabetic polyneuropathy ([place]) 12/12/2008  . ERECTILE DYSFUNCTION 03/18/2008  . Chronic lymphocytic leukemia ([place]) 03/08/2007  . Diabetes mellitus type II, controlled ([place]) 03/08/2007  . Gout 03/08/2007  . Glaucoma 03/08/2007  . Essential hypertension 03/08/2007  . Osteoarthritis 03/08/2007    Darrel Hoover PT 07/31/2015, 12:40 PM  [facility] [address] [place], [place], [address] Phone: [phone]   Fax:  [phone]  Name: Kevin Rogers MRN: [id number] Date of Birth: 1931-10-23

## 2015-08-03 ENCOUNTER — Ambulatory Visit: Payer: Medicare Other | Attending: Infectious Diseases | Admitting: Physical Therapy

## 2015-08-03 DIAGNOSIS — R262 Difficulty in walking, not elsewhere classified: Secondary | ICD-10-CM | POA: Insufficient documentation

## 2015-08-03 DIAGNOSIS — M545 Low back pain: Secondary | ICD-10-CM | POA: Diagnosis not present

## 2015-08-03 DIAGNOSIS — Z7409 Other reduced mobility: Secondary | ICD-10-CM | POA: Insufficient documentation

## 2015-08-03 DIAGNOSIS — M6281 Muscle weakness (generalized): Secondary | ICD-10-CM | POA: Insufficient documentation

## 2015-08-03 NOTE — Therapy (Signed)
[place], [place], [address] Phone: [phone]   Fax:  [phone]  Physical Therapy Treatment  Patient Details  Name: Kevin Rogers MRN: [id number] Date of Birth: 1931-07-22 Referring Provider: Verlin Grills  Encounter Date: 08/03/2015      PT End of Session - 08/03/15 1425    Visit Number 5   Number of Visits 24   Date for PT Re-Evaluation 09/14/15   Authorization Type [facility] [employer]   PT Start Time 0219   PT Stop Time 0300   PT Time Calculation (min) 41 min      Past Medical History  Diagnosis Date  . Diabetes mellitus type 2 with complications ([place]) 0000000  . Hyperlipidemia   . Hypertension   . Leukemia, chronic ([place]) 2001    does not take treatment just has blood levels checked once a year  . Glaucoma     left eye  . Gout   . Acute bronchitis 09/12/2007    Qualifier: Diagnosis of  By: [name] MD, [name]     Past Surgical History  Procedure Laterality Date  . Lumbar laminectomy  1997  . Hernia repair  1989  . Tonsillectomy    . Colonoscopy w/ polypectomy    . Dental surgery      implanted teeth x 2  . Anterior lat lumbar fusion Left 01/03/2013    Procedure: ANTERIOR LATERAL LUMBAR FUSION lumbar three-four;  Surgeon: [name], MD;  Location: [place] [facility];  Service: Neurosurgery;  Laterality: Left;  . Lumbar percutaneous pedicle screw 1 level  01/03/2013    Procedure: LUMBAR PERCUTANEOUS PEDICLE SCREWS LUMBAR THREE-FOUR;  Surgeon: [name], MD;  Location: [facility];  Service: Neurosurgery;;  . Back surgery      lumbar fusion - 2016, feb  . Lumbar wound debridement N/A 07/23/2014    Procedure: LUMBAR WOUND DEBRIDEMENT;  Surgeon: [name], MD;  Location: [place] [facility];  Service: Neurosurgery;  Laterality: N/A;    There were no vitals filed for this visit.  Visit Diagnosis:  Muscle weakness (generalized)  Difficulty in walking, not elsewhere classified       Subjective Assessment - 08/03/15 1424    Subjective N o change in pain. 3/10 at the most.   Currently in Pain? No/denies   Pain Location Back   Aggravating Factors  standing, walking   Pain Relieving Factors sitting, lying, meds            [facility] PT Assessment - 08/03/15 0001    AROM   Lumbar Extension neutral   Lumbar - Right Side Bend 20   Lumbar - Left Side Bend 25   Strength   Right Hip ABduction 2+/5   Left Hip ABduction 2+/5   Flexibility   Hamstrings SLR 65 degrees bilat                     [facility] Adult PT Treatment/Exercise - 08/03/15 0001    Lumbar Exercises: Aerobic   Stationary Bike Nustep L6 LE only 6 min   Lumbar Exercises: Supine   Clam 10 reps  RT and Lt   Clam Limitations with cues for abdominal set with movement of each leg x 10   Heel Slides 10 reps  RT and LT   Bent Knee Raise 10 reps   Bent Knee Raise Limitations RT /LT with cues for abdominal setting   Knee/Hip Exercises: Stretches   Hip Flexor Stretch 1 rep;60  seconds   Hip Flexor Stretch Limitations bilateral                PT Education - 08/03/15 1500    Education provided Yes   Education Details Prepilates   Person(s) Educated Patient   Methods Explanation;Handout   Comprehension Verbalized understanding          PT Short Term Goals - 08/03/15 1508    PT SHORT TERM GOAL #1   Title Pt will be independent with initial HEP for continued strengthening and mobility by 08/21/15    Status Achieved   PT SHORT TERM GOAL #2   Title Lumbar extension AROM improved to -10 degrees and pain - free by 08/21/15.     Status Achieved   PT SHORT TERM GOAL #3   Title Pt will be able to demo posture and body mechanics as it relates to lumbar spine by 08/21/15.    Baseline requires cues for bed mobility   Status On-going           PT Long Term Goals - 08/03/15 1510    PT LONG TERM GOAL #1   Title FOTO score will improve from 50% limited to 45% to demo improved function and  mobility by 09/15/15.     Time 8   Period Weeks   Status On-going   PT LONG TERM GOAL #2   Title H/S flexibility 90/90 measures will improve by at least 10 degrees bilat, to -35 on R and -40 on L, in order for pt to tolerate standing for 1 hour pain- free by 09/15/15.   Time 8   Period Weeks   Status On-going   PT LONG TERM GOAL #3   Title Pt will tolerate standing and walking for 20 mins with pain < 3/10 by 09/15/15.    Time 8   Period Weeks   Status On-going   PT LONG TERM GOAL #4   Title Bilat hip ABD strength will improve to 3/5 in order to stand unsupported in order to perform daily ADLs and grooming by 09/15/15.    Time 8   Period Weeks   Status On-going               Plan - 08/03/15 1504    Clinical Impression Statement Pts lumbar AROM improved. Hip abduction strength nearly the same. Pt with improved ability to maintain erect posture however fatigues quickly with gait. Updated HEP to include core strengthening, clam shells and hip flexor stretch without increased pain.    Pt will benefit from skilled therapeutic intervention in order to improve on the following deficits --  difficulty walking and muscle weakness   PT Next Visit Plan Review mat HEP, continue core and LE strength, gait with upright posture        Problem List Patient Active Problem List   Diagnosis Date Noted  . Enteritis due to Clostridium difficile   . Discitis of lumbar region   . Osteomyelitis of lumbar vertebra ([name])   . Hyperlipidemia   . CKD (chronic kidney disease), stage III 01/13/2015  . Lumbar spinal stenosis 06/26/2014  . BPH (benign prostatic hyperplasia) 06/24/2014  . B12 deficiency 12/12/2008  . Diabetic polyneuropathy ([place]) 12/12/2008  . ERECTILE DYSFUNCTION 03/18/2008  . Chronic lymphocytic leukemia ([place]) 03/08/2007  . Diabetes mellitus type II, controlled ([place]) 03/08/2007  . Gout 03/08/2007  . Glaucoma 03/08/2007  . Essential hypertension 03/08/2007  . Osteoarthritis  03/08/2007    Dorene Ar, PTA 08/03/2015, 4:57  PM  [place], [place], [address] Phone: [phone]   Fax:  [phone]  Name: Kevin Rogers MRN: [id number] Date of Birth: 05/15/1931

## 2015-08-03 NOTE — Patient Instructions (Signed)
     Isometric Hold With Pelvic Floor (Hook-Lying)  Lie with hips and knees bent. Slowly inhale, and then exhale. Pull navel toward spine and tighten pelvic floor. Hold for __10_ seconds. Continue to breathe in and out during hold. Rest for _10__ seconds. Repeat __10_ times. Do __2-3_ times a day.   Knee Fold  Lie on back, legs bent, arms by sides. Exhale, lifting knee to chest. Inhale, returning. Keep abdominals flat, navel to spine. Repeat __10__ times, alternating legs. Do __2__ sessions per day.  Knee Drop  Keep pelvis stable. Without rotating hips, slowly drop knee to side, pause, return to center, bring knee across midline toward opposite hip. Feel obliques engaging. Repeat for ___10_ times each leg.   Copyright  VHI. All rights reserved.       Heel Slide to Straight   Slide one leg down to straight. Return. Be sure pelvis does not rock forward, tilt, rotate, or tip to side. Do _10__ times. Restabilize pelvis. Repeat with other leg. Do __1-2_ sets, __2_ times per day.  Abduction: Clam (Eccentric) - Side-Lying    Lie on side with knees bent. Lift top knee, keeping feet together. Keep trunk steady. Slowly lower for 3-5 seconds. _10__ reps per set, _3__ sets per day, _7__ days per week. Add ___ lbs when you achieve ___ repetitions.  Hip Flexor Stretch    Lying on back near edge of bed, bend one leg, foot flat. Hang other leg over edge, relaxed, thigh resting entirely on bed for __1__ minutes. Repeat __1__ times on each side. Do __2__ sessions per day. Advanced Exercise: Bend knee back keeping thigh in contact with bed.  http://gt2.exer.us/346   Copyright  VHI. All rights reserved.

## 2015-08-05 ENCOUNTER — Ambulatory Visit: Payer: Medicare Other | Admitting: Physical Therapy

## 2015-08-05 DIAGNOSIS — R262 Difficulty in walking, not elsewhere classified: Secondary | ICD-10-CM | POA: Diagnosis not present

## 2015-08-05 DIAGNOSIS — M6281 Muscle weakness (generalized): Secondary | ICD-10-CM | POA: Diagnosis not present

## 2015-08-05 DIAGNOSIS — Z7409 Other reduced mobility: Secondary | ICD-10-CM | POA: Diagnosis not present

## 2015-08-05 DIAGNOSIS — M545 Low back pain, unspecified: Secondary | ICD-10-CM

## 2015-08-05 NOTE — Therapy (Signed)
[place], [place], [address] Phone: [phone]   Fax:  [phone]  Physical Therapy Treatment  Patient Details  Name: Kevin Rogers MRN: [id number] Date of Birth: 01/23/1932 Referring Provider: Verlin Grills  Encounter Date: 08/05/2015      PT End of Session - 08/05/15 1807    Visit Number 6   Number of Visits 24   Date for PT Re-Evaluation 09/14/15   PT Start Time [id number]   PT Stop Time 1500   PT Time Calculation (min) 43 min   Activity Tolerance Patient tolerated treatment well   Behavior During Therapy [facility] for tasks assessed/performed      Past Medical History  Diagnosis Date  . Diabetes mellitus type 2 with complications ([place]) 0000000  . Hyperlipidemia   . Hypertension   . Leukemia, chronic ([place]) 2001    does not take treatment just has blood levels checked once a year  . Glaucoma     left eye  . Gout   . Acute bronchitis 09/12/2007    Qualifier: Diagnosis of  By: [name] MD, [name]     Past Surgical History  Procedure Laterality Date  . Lumbar laminectomy  1997  . Hernia repair  1989  . Tonsillectomy    . Colonoscopy w/ polypectomy    . Dental surgery      implanted teeth x 2  . Anterior lat lumbar fusion Left 01/03/2013    Procedure: ANTERIOR LATERAL LUMBAR FUSION lumbar three-four;  Surgeon: [name], MD;  Location: [place] [facility];  Service: Neurosurgery;  Laterality: Left;  . Lumbar percutaneous pedicle screw 1 level  01/03/2013    Procedure: LUMBAR PERCUTANEOUS PEDICLE SCREWS LUMBAR THREE-FOUR;  Surgeon: [name], MD;  Location: [facility];  Service: Neurosurgery;;  . Back surgery      lumbar fusion - 2016, feb  . Lumbar wound debridement N/A 07/23/2014    Procedure: LUMBAR WOUND DEBRIDEMENT;  Surgeon: [name], MD;  Location: [place] [facility];  Service: Neurosurgery;  Laterality: N/A;    There were no vitals filed for this visit.  Visit Diagnosis:  Muscle weakness  (generalized)  Difficulty in walking, not elsewhere classified  Midline low back pain without sciatica      Subjective Assessment - 08/05/15 1421    Subjective This morning pain was 6/10 Vs normal 3-4/10.  It has been sore and hurting since the last visit.  May be due to not enough pillows under his head while laying on the mat. Uses the walker at all times.    Currently in Pain? Yes   Pain Score 6    Pain Location Back   Pain Orientation Left;Lower   Pain Descriptors / Indicators Sore   Pain Frequency Intermittent   Aggravating Factors  lying flat,  being up on feet.    Pain Relieving Factors pain medication                         [facility] Adult PT Treatment/Exercise - 08/05/15 1430    Lumbar Exercises: Stretches   Passive Hamstring Stretch 3 reps;30 seconds   Double Knee to Chest Stretch --  10 X feet on black ball   Lower Trunk Rotation --  10 X legs on black ball   Lumbar Exercises: Aerobic   Stationary Bike 6 minutes L4, legs only   Lumbar Exercises: Seated   Sit to Stand 5 reps  sit to stand,  with 5 second lower.     Sit to Stand Limitations sitting posture work,  anterior pelvic tilt X5   Lumbar Exercises: Supine   Bent Knee Raise 10 reps  0, 3 LBs 10 X each   Bridge 10 reps   Large Ball Abdominal Isometric 10 reps  both and single arm presses   Knee/Hip Exercises: Standing   Forward Step Up --  5-8 X 4 inches, 2 hands   Other Standing Knee Exercises weightshifts working on glut med.  10 X 2 sets for each hip.                  PT Short Term Goals - 08/03/15 1508    PT SHORT TERM GOAL #1   Title Pt will be independent with initial HEP for continued strengthening and mobility by 08/21/15    Status Achieved   PT SHORT TERM GOAL #2   Title Lumbar extension AROM improved to -10 degrees and pain - free by 08/21/15.     Status Achieved   PT SHORT TERM GOAL #3   Title Pt will be able to demo posture and body mechanics as it relates to lumbar  spine by 08/21/15.    Baseline requires cues for bed mobility   Status On-going           PT Long Term Goals - 08/03/15 1510    PT LONG TERM GOAL #1   Title FOTO score will improve from 50% limited to 45% to demo improved function and mobility by 09/15/15.     Time 8   Period Weeks   Status On-going   PT LONG TERM GOAL #2   Title H/S flexibility 90/90 measures will improve by at least 10 degrees bilat, to -35 on R and -40 on L, in order for pt to tolerate standing for 1 hour pain- free by 09/15/15.   Time 8   Period Weeks   Status On-going   PT LONG TERM GOAL #3   Title Pt will tolerate standing and walking for 20 mins with pain < 3/10 by 09/15/15.    Time 8   Period Weeks   Status On-going   PT LONG TERM GOAL #4   Title Bilat hip ABD strength will improve to 3/5 in order to stand unsupported in order to perform daily ADLs and grooming by 09/15/15.    Time 8   Period Weeks   Status On-going               Plan - 08/05/15 1809    Clinical Impression Statement Patient will continue to benifit from skilled PT to address:  muscle weakness,(generalized), difficulty in walking, not elsewhere classified, midline lowback pain without sciatica.  Able to progress to some colsed chain exercises today.    PT Next Visit Plan Review mat HEP, continue core and LE strength, gait with upright posture, continue closes chain strength.Update home program.    PT Home Exercise Plan continue   Consulted and Agree with Plan of Care Patient;Family member/caregiver   Family Member Consulted wife        Problem List Patient Active Problem List   Diagnosis Date Noted  . Enteritis due to Clostridium difficile   . Discitis of lumbar region   . Osteomyelitis of lumbar vertebra ([place])   . Hyperlipidemia   . CKD (chronic kidney disease), stage III 01/13/2015  . Lumbar spinal stenosis 06/26/2014  . BPH (benign prostatic hyperplasia) 06/24/2014  . B12 deficiency 12/12/2008  .  Diabetic  polyneuropathy ([place]) 12/12/2008  . ERECTILE DYSFUNCTION 03/18/2008  . Chronic lymphocytic leukemia ([place]) 03/08/2007  . Diabetes mellitus type II, controlled ([place]) 03/08/2007  . Gout 03/08/2007  . Glaucoma 03/08/2007  . Essential hypertension 03/08/2007  . Osteoarthritis 03/08/2007    Kevin Rogers 08/05/2015, 6:12 PM  [facility] [address] [place], [place], [address] Phone: [phone]   Fax:  [phone]  Name: Kevin Rogers MRN: [id number] Date of Birth: July 03, 1931    Melvenia Needles, PTA 08/05/2015 6:12 PM Phone: [phone] Fax: [phone]

## 2015-08-07 ENCOUNTER — Ambulatory Visit: Payer: Medicare Other | Admitting: Physical Therapy

## 2015-08-07 DIAGNOSIS — M6281 Muscle weakness (generalized): Secondary | ICD-10-CM

## 2015-08-07 DIAGNOSIS — Z7409 Other reduced mobility: Secondary | ICD-10-CM | POA: Diagnosis not present

## 2015-08-07 DIAGNOSIS — R262 Difficulty in walking, not elsewhere classified: Secondary | ICD-10-CM

## 2015-08-07 DIAGNOSIS — M545 Low back pain, unspecified: Secondary | ICD-10-CM

## 2015-08-07 NOTE — Therapy (Signed)
[place], [place], [address] Phone: [phone]   Fax:  [phone]  Physical Therapy Treatment  Patient Details  Name: Kevin Rogers MRN: [id number] Date of Birth: 1932/02/26 Referring Provider: Verlin Rogers  Encounter Date: 08/07/2015      PT End of Session - 08/07/15 1108    Visit Number 6   Number of Visits 24   Date for PT Re-Evaluation 09/14/15   PT Start Time 1107   PT Stop Time 1147   PT Time Calculation (min) 40 min   Activity Tolerance Patient tolerated treatment well   Behavior During Therapy [facility] for tasks assessed/performed      Past Medical History  Diagnosis Date  . Diabetes mellitus type 2 with complications ([place]) 0000000  . Hyperlipidemia   . Hypertension   . Leukemia, chronic ([place]) 2001    does not take treatment just has blood levels checked once a year  . Glaucoma     left eye  . Gout   . Acute bronchitis 09/12/2007    Qualifier: Diagnosis of  By: [name] MD, [name]     Past Surgical History  Procedure Laterality Date  . Lumbar laminectomy  1997  . Hernia repair  1989  . Tonsillectomy    . Colonoscopy w/ polypectomy    . Dental surgery      implanted teeth x 2  . Anterior lat lumbar fusion Left 01/03/2013    Procedure: ANTERIOR LATERAL LUMBAR FUSION lumbar three-four;  Surgeon: [name], MD;  Location: [facility];  Service: Neurosurgery;  Laterality: Left;  . Lumbar percutaneous pedicle screw 1 level  01/03/2013    Procedure: LUMBAR PERCUTANEOUS PEDICLE SCREWS LUMBAR THREE-FOUR;  Surgeon: [name], MD;  Location: [facility];  Service: Neurosurgery;;  . Back surgery      lumbar fusion - 2016, feb  . Lumbar wound debridement N/A 07/23/2014    Procedure: LUMBAR WOUND DEBRIDEMENT;  Surgeon: [name], MD;  Location: [place] [facility];  Service: Neurosurgery;  Laterality: N/A;    There were no vitals filed for this visit.      Subjective Assessment -  08/07/15 1109    Subjective "in the morning I have more pain rated at a 6/10 but after moving around it drops to a 3-4/10" pt reports being consistent with his HEP.    Currently in Pain? Yes   Pain Score 3    Pain Location Back   Pain Orientation Left;Lower   Pain Descriptors / Indicators Sore  uncomfortable pain   Pain Type Chronic pain   Pain Onset More than a month ago                         [facility] Adult PT Treatment/Exercise - 08/07/15 1116    Lumbar Exercises: Stretches   Active Hamstring Stretch 3 reps;30 seconds  contract / relax with 10 sec hold   Lower Trunk Rotation 3 reps;30 seconds   Lumbar Exercises: Aerobic   Stationary Bike L6 x 5 min  Nu-step using LE only   Lumbar Exercises: Standing   Other Standing Lumbar Exercises standing hip abduction 2 x 10 (holding onto back of chair for safety)  cues to keep toes point forward   Lumbar Exercises: Seated   Hip Flexion on Ball AROM;Other (comment);Limitations;15 reps  2 sets, sitting on table not ball   Sit to Stand 5 reps  x 2 from table sitting  on 4 inch step for added height   Lumbar Exercises: Supine   Bent Knee Raise 20 reps  2 sets, cues to keep core tight   Bent Knee Raise Limitations count out loud to facilitate breathing   Bridge 15 reps  1 set   Straight Leg Raise 10 reps;1 second  3 x with tactile cues for correct height                  PT Short Term Goals - 08/03/15 1508    PT SHORT TERM GOAL #1   Title Pt will be independent with initial HEP for continued strengthening and mobility by 08/21/15    Status Achieved   PT SHORT TERM GOAL #2   Title Lumbar extension AROM improved to -10 degrees and pain - free by 08/21/15.     Status Achieved   PT SHORT TERM GOAL #3   Title Pt will be able to demo posture and body mechanics as it relates to lumbar spine by 08/21/15.    Baseline requires cues for bed mobility   Status On-going           PT Long Term Goals - 08/03/15 1510     PT LONG TERM GOAL #1   Title FOTO score will improve from 50% limited to 45% to demo improved function and mobility by 09/15/15.     Time 8   Period Weeks   Status On-going   PT LONG TERM GOAL #2   Title H/S flexibility 90/90 measures will improve by at least 10 degrees bilat, to -35 on R and -40 on L, in order for pt to tolerate standing for 1 hour pain- free by 09/15/15.   Time 8   Period Weeks   Status On-going   PT LONG TERM GOAL #3   Title Pt will tolerate standing and walking for 20 mins with pain < 3/10 by 09/15/15.    Time 8   Period Weeks   Status On-going   PT LONG TERM GOAL #4   Title Bilat hip ABD strength will improve to 3/5 in order to stand unsupported in order to perform daily ADLs and grooming by 09/15/15.    Time 8   Period Weeks   Status On-going               Plan - 08/07/15 1151    Clinical Impression Statement [name] reports pain at 3-4/10 this morning. focused on strengthening of the hips in supine and standing, pt required cues for proper form and to work on breathing counting out loud. Post session he reported pain to be 1.5/10 in the low back wiht standing and declined modalities.    PT Next Visit Plan Review mat HEP, continue core and LE strength, gait with upright posture, continue closes chain strength.Update home program.    Consulted and Agree with Plan of Care Patient;Family member/caregiver   Family Member Consulted wife      Patient will benefit from skilled therapeutic intervention in order to improve the following deficits and impairments:  Abnormal gait, Decreased activity tolerance, Decreased balance, Decreased endurance, Decreased safety awareness, Pain, Improper body mechanics, Postural dysfunction, Increased fascial restricitons, Increased muscle spasms, Decreased range of motion, Decreased strength, Hypomobility, Difficulty walking, Decreased mobility  Visit Diagnosis: Muscle weakness (generalized)  Difficulty in walking, not elsewhere  classified  Midline low back pain without sciatica     Problem List Patient Active Problem List   Diagnosis Date Noted  . Enteritis due  to Clostridium difficile   . Discitis of lumbar region   . Osteomyelitis of lumbar vertebra ([place])   . Hyperlipidemia   . CKD (chronic kidney disease), stage III 01/13/2015  . Lumbar spinal stenosis 06/26/2014  . BPH (benign prostatic hyperplasia) 06/24/2014  . B12 deficiency 12/12/2008  . Diabetic polyneuropathy ([place]) 12/12/2008  . ERECTILE DYSFUNCTION 03/18/2008  . Chronic lymphocytic leukemia ([place]) 03/08/2007  . Diabetes mellitus type II, controlled ([place]) 03/08/2007  . Gout 03/08/2007  . Glaucoma 03/08/2007  . Essential hypertension 03/08/2007  . Osteoarthritis 03/08/2007   Starr Lake PT, DPT, LAT, ATC  08/07/2015  12:00 PM      [facility] [address] [place], [place], [address] Phone: [phone]   Fax:  [phone]  Name: Kevin Rogers MRN: [id number] Date of Birth: June 11, 1931

## 2015-08-10 ENCOUNTER — Ambulatory Visit: Payer: Medicare Other | Admitting: Physical Therapy

## 2015-08-10 ENCOUNTER — Other Ambulatory Visit (INDEPENDENT_AMBULATORY_CARE_PROVIDER_SITE_OTHER): Payer: Medicare Other

## 2015-08-10 DIAGNOSIS — R262 Difficulty in walking, not elsewhere classified: Secondary | ICD-10-CM

## 2015-08-10 DIAGNOSIS — Z789 Other specified health status: Secondary | ICD-10-CM

## 2015-08-10 DIAGNOSIS — M6281 Muscle weakness (generalized): Secondary | ICD-10-CM | POA: Diagnosis not present

## 2015-08-10 DIAGNOSIS — Z7409 Other reduced mobility: Secondary | ICD-10-CM

## 2015-08-10 DIAGNOSIS — M545 Low back pain, unspecified: Secondary | ICD-10-CM

## 2015-08-10 DIAGNOSIS — Z1211 Encounter for screening for malignant neoplasm of colon: Secondary | ICD-10-CM

## 2015-08-10 LAB — POC HEMOCCULT BLD/STL (HOME/3-CARD/SCREEN)
Card #2 Fecal Occult Blod, POC: NEGATIVE
Card #3 Fecal Occult Blood, POC: NEGATIVE
Fecal Occult Blood, POC: NEGATIVE

## 2015-08-10 NOTE — Therapy (Signed)
[place], [place], [address] Phone: [phone]   Fax:  [phone]  Physical Therapy Treatment  Patient Details  Name: Kevin Rogers MRN: [id number] Date of Birth: 1932-03-31 Referring Provider: Verlin Grills  Encounter Date: 08/10/2015      PT End of Session - 08/10/15 1707    Visit Number 7   Number of Visits 24   Date for PT Re-Evaluation 09/14/15   PT Start Time 0215   PT Stop Time 0300   PT Time Calculation (min) 45 min      Past Medical History  Diagnosis Date  . Diabetes mellitus type 2 with complications ([place]) 6962  . Hyperlipidemia   . Hypertension   . Leukemia, chronic ([place]) 2001    does not take treatment just has blood levels checked once a year  . Glaucoma     left eye  . Gout   . Acute bronchitis 09/12/2007    Qualifier: Diagnosis of  By: [name] MD, [name]     Past Surgical History  Procedure Laterality Date  . Lumbar laminectomy  1997  . Hernia repair  1989  . Tonsillectomy    . Colonoscopy w/ polypectomy    . Dental surgery      implanted teeth x 2  . Anterior lat lumbar fusion Left 01/03/2013    Procedure: ANTERIOR LATERAL LUMBAR FUSION lumbar three-four;  Surgeon: [name], MD;  Location: [place] [facility];  Service: Neurosurgery;  Laterality: Left;  . Lumbar percutaneous pedicle screw 1 level  01/03/2013    Procedure: LUMBAR PERCUTANEOUS PEDICLE SCREWS LUMBAR THREE-FOUR;  Surgeon: [name], MD;  Location: [facility];  Service: Neurosurgery;;  . Back surgery      lumbar fusion - 2016, feb  . Lumbar wound debridement N/A 07/23/2014    Procedure: LUMBAR WOUND DEBRIDEMENT;  Surgeon: [name], MD;  Location: [place] [facility];  Service: Neurosurgery;  Laterality: N/A;    There were no vitals filed for this visit.      Subjective Assessment - 08/10/15 1418    Subjective Pt. rated his pain as a 3/10 before tx. and 4-5/10 after tx.   Currently in Pain? Yes   Pain Score 3    Pain Location Back            [facility] PT Assessment - 08/10/15 0001    Strength   Right Hip ABduction 3/5   Left Hip ABduction 3/5   Flexibility   Hamstrings 90/90 HS supine: R: -38, L: -32   HS SLR: 43 bilat                     [facility] Adult PT Treatment/Exercise - 08/10/15 0001    Exercises   Exercises Knee/Hip   Lumbar Exercises: Stretches   Passive Hamstring Stretch 3 reps;30 seconds   Lower Trunk Rotation 3 reps;30 seconds   Lumbar Exercises: Seated   Sit to Stand 10 reps  2 sets, elevated table   Lumbar Exercises: Supine   Bridge 15 reps  2 sets.   Straight Leg Raise 10 reps;1 second  3x. cues for slow eccentric movement.   Other Supine Lumbar Exercises deadbugs  supine. 10 reps. x 2 sets.   Knee/Hip Exercises: Standing   Hip Abduction Stengthening;Both;3 sets;10 reps   Knee/Hip Exercises: Sidelying   Clams 3 sets. x 10 reps.  PT Short Term Goals - 08/03/15 1508    PT SHORT TERM GOAL #1   Title Pt will be independent with initial HEP for continued strengthening and mobility by 08/21/15    Status Achieved   PT SHORT TERM GOAL #2   Title Lumbar extension AROM improved to -10 degrees and pain - free by 08/21/15.     Status Achieved   PT SHORT TERM GOAL #3   Title Pt will be able to demo posture and body mechanics as it relates to lumbar spine by 08/21/15.    Baseline requires cues for bed mobility   Status On-going           PT Long Term Goals - 08/03/15 1510    PT LONG TERM GOAL #1   Title FOTO score will improve from 50% limited to 45% to demo improved function and mobility by 09/15/15.     Time 8   Period Weeks   Status On-going   PT LONG TERM GOAL #2   Title H/S flexibility 90/90 measures will improve by at least 10 degrees bilat, to -35 on R and -40 on L, in order for pt to tolerate standing for 1 hour pain- free by 09/15/15.   Time 8   Period Weeks   Status On-going   PT LONG TERM GOAL #3    Title Pt will tolerate standing and walking for 20 mins with pain < 3/10 by 09/15/15.    Time 8   Period Weeks   Status On-going   PT LONG TERM GOAL #4   Title Bilat hip ABD strength will improve to 3/5 in order to stand unsupported in order to perform daily ADLs and grooming by 09/15/15.    Time 8   Period Weeks   Status On-going               Plan - 08/10/15 1658    Clinical Impression Statement He reported a 3-4/10 pain this morning. Tested his hip abduction strength. Reached his goal of a 3/5 MMT for hip abduction. Focused on stretching the hamstrings and hips. Also focused on strengthening his hips in supine and standing.. Cues needed to maintain proper posture during standing exercises. Pt able to perform sit-stand 2 sets x10 with minimal  rest break.    PT Next Visit Plan Increase intensity of band from yellow to red for clam shells. Continue to focus on proper posture. Continue to strengthen hip abduction strength even though goal of 3/5 was met. Continue with core exercises.      Patient will benefit from skilled therapeutic intervention in order to improve the following deficits and impairments:     Visit Diagnosis: Muscle weakness (generalized)  Difficulty in walking, not elsewhere classified  Midline low back pain without sciatica  Impaired mobility and ADLs     Problem List Patient Active Problem List   Diagnosis Date Noted  . Enteritis due to Clostridium difficile   . Discitis of lumbar region   . Osteomyelitis of lumbar vertebra ([facility])   . Hyperlipidemia   . CKD (chronic kidney disease), stage III 01/13/2015  . Lumbar spinal stenosis 06/26/2014  . BPH (benign prostatic hyperplasia) 06/24/2014  . B12 deficiency 12/12/2008  . Diabetic polyneuropathy ([facility]) 12/12/2008  . ERECTILE DYSFUNCTION 03/18/2008  . Chronic lymphocytic leukemia ([facility]) 03/08/2007  . Diabetes mellitus type II, controlled ([facility]) 03/08/2007  . Gout 03/08/2007  . Glaucoma 03/08/2007  .  Essential hypertension 03/08/2007  . Osteoarthritis 03/08/2007    Andrew   Tendler, SPTA 08/10/2015, 5:31 PM  [facility] [address] [place], [place], [address] Phone: [phone]   Fax:  [phone]  Name: Dominie Prusinski MRN: [id number] Date of Birth: 08/31/1931     

## 2015-08-12 ENCOUNTER — Ambulatory Visit: Payer: Medicare Other | Admitting: Physical Therapy

## 2015-08-12 DIAGNOSIS — M545 Low back pain, unspecified: Secondary | ICD-10-CM

## 2015-08-12 DIAGNOSIS — R262 Difficulty in walking, not elsewhere classified: Secondary | ICD-10-CM

## 2015-08-12 DIAGNOSIS — Z7409 Other reduced mobility: Secondary | ICD-10-CM | POA: Diagnosis not present

## 2015-08-12 DIAGNOSIS — M6281 Muscle weakness (generalized): Secondary | ICD-10-CM | POA: Diagnosis not present

## 2015-08-12 NOTE — Therapy (Addendum)
[place], [place], [address] Phone: [phone]   Fax:  [phone]  Physical Therapy Treatment  Patient Details  Name: Kevin Rogers MRN: [id number] Date of Birth: Dec 06, 1931 Referring Provider: Verlin Grills  Encounter Date: 08/12/2015      PT End of Session - 08/12/15 1501    Visit Number 8   Number of Visits 24   Date for PT Re-Evaluation 09/14/15   PT Start Time 1410   PT Stop Time 1500   PT Time Calculation (min) 50 min   Activity Tolerance Patient tolerated treatment well;No increased pain   Behavior During Therapy [facility] for tasks assessed/performed      Past Medical History  Diagnosis Date  . Diabetes mellitus type 2 with complications ([place]) 0000000  . Hyperlipidemia   . Hypertension   . Leukemia, chronic ([place]) 2001    does not take treatment just has blood levels checked once a year  . Glaucoma     left eye  . Gout   . Acute bronchitis 09/12/2007    Qualifier: Diagnosis of  By: [name] MD, [name]     Past Surgical History  Procedure Laterality Date  . Lumbar laminectomy  1997  . Hernia repair  1989  . Tonsillectomy    . Colonoscopy w/ polypectomy    . Dental surgery      implanted teeth x 2  . Anterior lat lumbar fusion Left 01/03/2013    Procedure: ANTERIOR LATERAL LUMBAR FUSION lumbar three-four;  Surgeon: [name], MD;  Location: [place] [facility];  Service: Neurosurgery;  Laterality: Left;  . Lumbar percutaneous pedicle screw 1 level  01/03/2013    Procedure: LUMBAR PERCUTANEOUS PEDICLE SCREWS LUMBAR THREE-FOUR;  Surgeon: [name], MD;  Location: [facility];  Service: Neurosurgery;;  . Back surgery      lumbar fusion - 2016, feb  . Lumbar wound debridement N/A 07/23/2014    Procedure: LUMBAR WOUND DEBRIDEMENT;  Surgeon: [name], MD;  Location: [place] [facility];  Service: Neurosurgery;  Laterality: N/A;    There were no vitals filed for this visit.      Subjective  Assessment - 08/12/15 1416    Subjective Able to lift his legs a little better, higher.  Does not have problems with bed mobility.    Currently in Pain? Yes   Pain Score 3    Pain Location Back   Pain Orientation Left;Lower   Pain Descriptors / Indicators Sore   Aggravating Factors  lying flat, being up on feet   Pain Relieving Factors pain medication,  wedge on table                         [facility] Adult PT Treatment/Exercise - 08/12/15 1417    Lumbar Exercises: Stretches   Lower Trunk Rotation --  10 X   Pelvic Tilt --  10 x   Lumbar Exercises: Aerobic   Stationary Bike L6, 5 minutes  Nu step   Lumbar Exercises: Seated   Hip Flexion on Ball 10 reps  2 sets sitting on mat   Sit to Stand 10 reps  with brief pause with lowering.   Lumbar Exercises: Supine   Bent Knee Raise --  10 with 1 LBS ankles, 20 sets.   Bridge 15 reps  small  movement, 2 sets   Straight Leg Raise 10 reps  each,    Lumbar Exercises: Sidelying   Clam  5 reps  2 sets  , 1 set 2 LBS   Other Sidelying Lumbar Exercises Resisted hip and knee flexion 10 X each, manually   Knee/Hip Exercises: Stretches   Passive Hamstring Stretch 3 reps;30 seconds   Knee/Hip Exercises: Standing   Hip Abduction 1 set;15 reps;Knee straight  each   Forward Step Up 10 reps  each leg, CGA for safety, 4 inches   Other Standing Knee Exercises weight shift forward with arm slide up wall 10 X each, CGA   Knee/Hip Exercises: Seated   Sit to Sand 5 reps  from elevated surface                  PT Short Term Goals - 08/12/15 1716    PT SHORT TERM GOAL #1   Title Pt will be independent with initial HEP for continued strengthening and mobility by 08/21/15    Status Achieved   PT SHORT TERM GOAL #2   Title Lumbar extension AROM improved to -10 degrees and pain - free by 08/21/15.     Status Achieved   PT SHORT TERM GOAL #3   Title Pt will be able to demo posture and body mechanics as it relates to lumbar  spine by 08/21/15.    Baseline cues for sitting/standing posture   Time 4   Period Weeks   Status On-going           PT Long Term Goals - 08/03/15 1510    PT LONG TERM GOAL #1   Title FOTO score will improve from 50% limited to 45% to demo improved function and mobility by 09/15/15.     Time 8   Period Weeks   Status On-going   PT LONG TERM GOAL #2   Title H/S flexibility 90/90 measures will improve by at least 10 degrees bilat, to -35 on R and -40 on L, in order for pt to tolerate standing for 1 hour pain- free by 09/15/15.   Time 8   Period Weeks   Status On-going   PT LONG TERM GOAL #3   Title Pt will tolerate standing and walking for 20 mins with pain < 3/10 by 09/15/15.    Time 8   Period Weeks   Status On-going   PT LONG TERM GOAL #4   Title Bilat hip ABD strength will improve to 3/5 in order to stand unsupported in order to perform daily ADLs and grooming by 09/15/15.    Time 8   Period Weeks   Status On-going               Plan - 08/12/15 1502    Clinical Impression Statement Patient able to exercises a few exercises with light weights.  Strength improving gradually.  Patient will continue to benifit from skilled PT to address: muscle weakness, difficulty with walking, not classified elsewhere, midline lowback pain without sciatica.   Rehab Potential Fair   Clinical Impairments Affecting Rehab Potential chronic nature of LBP   PT Frequency 3x / week   PT Duration 8 weeks   PT Treatment/Interventions ADLs/Self Care Home Management;Cryotherapy;Electrical Stimulation;Moist Heat;Gait training;Stair training;Neuromuscular re-education;Patient/family education;Functional mobility training;Therapeutic activities;Therapeutic exercise;Balance training;Manual techniques;Dry needling;Passive range of motion;Taping;DME Instruction   PT Next Visit Plan Hip abduction, flexion,  gluteals in stangding,  try 4 inch stairs.  Core work, ICD 10 Cert completed   PT Home Exercise Plan  continue   Consulted and Agree with Plan of Care Patient;Family member/caregiver   Family Member Consulted wife  Patient will benefit from skilled therapeutic intervention in order to improve the following deficits and impairments:  Abnormal gait, Decreased activity tolerance, Decreased balance, Decreased endurance, Decreased safety awareness, Pain, Improper body mechanics, Postural dysfunction, Increased fascial restricitons, Increased muscle spasms, Decreased range of motion, Decreased strength, Hypomobility, Difficulty walking, Decreased mobility  Visit Diagnosis: Muscle weakness (generalized) - Plan: PT plan of care cert/re-cert  Difficulty in walking, not elsewhere classified - Plan: PT plan of care cert/re-cert  Midline low back pain without sciatica - Plan: PT plan of care cert/re-cert     Problem List Patient Active Problem List   Diagnosis Date Noted  . Enteritis due to Clostridium difficile   . Discitis of lumbar region   . Osteomyelitis of lumbar vertebra ([place])   . Hyperlipidemia   . CKD (chronic kidney disease), stage III 01/13/2015  . Lumbar spinal stenosis 06/26/2014  . BPH (benign prostatic hyperplasia) 06/24/2014  . B12 deficiency 12/12/2008  . Diabetic polyneuropathy ([place]) 12/12/2008  . ERECTILE DYSFUNCTION 03/18/2008  . Chronic lymphocytic leukemia ([place]) 03/08/2007  . Diabetes mellitus type II, controlled ([place]) 03/08/2007  . Gout 03/08/2007  . Glaucoma 03/08/2007  . Essential hypertension 03/08/2007  . Osteoarthritis 03/08/2007    Starr Lake 08/12/2015, 5:31 PM  [facility] [address] [place], [place], [address] Phone: [phone]   Fax:  [phone]  Name: Kevin Rogers MRN: [id number] Date of Birth: 08-Jan-1932    Melvenia Needles, PTA 08/12/2015 5:31 PM Phone: [phone] Fax: [phone]

## 2015-08-12 NOTE — Addendum Note (Signed)
Addended by: Larey Days on: 08/12/2015 05:32 PM   Modules accepted: Orders

## 2015-08-17 ENCOUNTER — Ambulatory Visit: Payer: Medicare Other

## 2015-08-17 DIAGNOSIS — M6281 Muscle weakness (generalized): Secondary | ICD-10-CM

## 2015-08-17 DIAGNOSIS — R262 Difficulty in walking, not elsewhere classified: Secondary | ICD-10-CM

## 2015-08-17 DIAGNOSIS — M545 Low back pain, unspecified: Secondary | ICD-10-CM

## 2015-08-17 DIAGNOSIS — Z7409 Other reduced mobility: Secondary | ICD-10-CM | POA: Diagnosis not present

## 2015-08-17 NOTE — Therapy (Signed)
[place], [place], [address] Phone: [phone]   Fax:  [phone]  Physical Therapy Treatment  Patient Details  Name: Kevin Rogers MRN: [id number] Date of Birth: 1932/04/08 Referring Provider: Verlin Grills  Encounter Date: 08/17/2015      PT End of Session - 08/17/15 1509    Visit Number 9   Number of Visits 24   PT Start Time 0215   PT Stop Time 0300   PT Time Calculation (min) 45 min   Activity Tolerance Patient tolerated treatment well   Behavior During Therapy [facility] for tasks assessed/performed      Past Medical History  Diagnosis Date  . Diabetes mellitus type 2 with complications ([place]) 0000000  . Hyperlipidemia   . Hypertension   . Leukemia, chronic ([place]) 2001    does not take treatment just has blood levels checked once a year  . Glaucoma     left eye  . Gout   . Acute bronchitis 09/12/2007    Qualifier: Diagnosis of  By: [name] MD, [name]     Past Surgical History  Procedure Laterality Date  . Lumbar laminectomy  1997  . Hernia repair  1989  . Tonsillectomy    . Colonoscopy w/ polypectomy    . Dental surgery      implanted teeth x 2  . Anterior lat lumbar fusion Left 01/03/2013    Procedure: ANTERIOR LATERAL LUMBAR FUSION lumbar three-four;  Surgeon: [name], MD;  Location: [place] [facility];  Service: Neurosurgery;  Laterality: Left;  . Lumbar percutaneous pedicle screw 1 level  01/03/2013    Procedure: LUMBAR PERCUTANEOUS PEDICLE SCREWS LUMBAR THREE-FOUR;  Surgeon: [name], MD;  Location: [facility];  Service: Neurosurgery;;  . Back surgery      lumbar fusion - 2016, feb  . Lumbar wound debridement N/A 07/23/2014    Procedure: LUMBAR WOUND DEBRIDEMENT;  Surgeon: [name], MD;  Location: [place] [facility];  Service: Neurosurgery;  Laterality: N/A;    There were no vitals filed for this visit.      Subjective Assessment - 08/17/15 1420    Subjective Back only 1-2  lately and leg weakness is primary problem.    Currently in Pain? Yes   Pain Score 2    Pain Location Back   Pain Orientation Left;Lower   Pain Descriptors / Indicators Sore   Pain Type Chronic pain   Pain Onset More than a month ago   Pain Frequency Intermittent   Aggravating Factors  extennsion positions   Pain Relieving Factors meds, elevated head   Multiple Pain Sites No                         [facility] Adult PT Treatment/Exercise - 08/17/15 1412    Knee/Hip Exercises: Aerobic   Nustep L6 8 min LE only   Knee/Hip Exercises: Standing   Heel Raises Limitations unable to lift heels so did pressure into floor x 15 bilateral   Hip Abduction Right;Left;15 reps   Abduction Limitations 5 pounds ast ankle   Forward Step Up Step Height: 4";10 reps;Right;Left   Knee/Hip Exercises: Seated   Long Arc Quad Right;Left;15 reps;Strengthening   Long Arc Quad Weight 5 lbs.   Other Seated Knee/Hip Exercises hip flexion x15 RT and LT   Sit to Sand 10 reps;with UE support  hands on walker   Knee/Hip Exercises: Supine   Bridges Limitations 12  Knee Extension Right;Left;10 reps  from hooklye part of range with SLR   Knee Flexion Both;15 reps  legs on red ball   Other Supine Knee/Hip Exercises clams blue band x 15 both knees with 3 sec hold   Other Supine Knee/Hip Exercises ball squeeze x 20 followed by LTR x 20 RT and LT legs on ball, followed by dead bug with legs on ball x 12 RT and LT, ending session on Nustep with L6 x 10 reps each leg then L7 x 10 each leg  then L8  then L9 , then 10 RPM > 50                  PT Short Term Goals - 08/12/15 1716    PT SHORT TERM GOAL #1   Title Pt will be independent with initial HEP for continued strengthening and mobility by 08/21/15    Status Achieved   PT SHORT TERM GOAL #2   Title Lumbar extension AROM improved to -10 degrees and pain - free by 08/21/15.     Status Achieved   PT SHORT TERM GOAL #3   Title Pt will be able to  demo posture and body mechanics as it relates to lumbar spine by 08/21/15.    Baseline cues for sitting/standing posture   Time 4   Period Weeks   Status On-going           PT Long Term Goals - 08/17/15 1512    PT LONG TERM GOAL #1   Title FOTO score will improve from 50% limited to 45% to demo improved function and mobility by 09/15/15.     Status Unable to assess   PT LONG TERM GOAL #2   Title H/S flexibility 90/90 measures will improve by at least 10 degrees bilat, to -35 on R and -40 on L, in order for pt to tolerate standing for 1 hour pain- free by 09/15/15.   Status On-going   PT LONG TERM GOAL #3   Title Pt will tolerate standing and walking for 20 mins with pain < 3/10 by 09/15/15.    Status On-going   PT LONG TERM GOAL #4   Title Bilat hip ABD strength will improve to 3/5 in order to stand unsupported in order to perform daily ADLs and grooming by 09/15/15.    Status On-going               Plan - 08/17/15 1509    Clinical Impression Statement He tolerated all eercise without incr back paiin. His wife  reported he is lifting legs higher. His HR was never more than 88 BPM with the second bout on the nustep.  He needs to have FOTO next to assess function and goal assessment.   PT Next Visit Plan Contniue LE strength,    FOTO ,    Goal assessment,    possible MMT LE.    G-CODE   Consulted and Agree with Plan of Care Patient;Family member/caregiver   Family Member Consulted wife      Patient will benefit from skilled therapeutic intervention in order to improve the following deficits and impairments:  Abnormal gait, Decreased activity tolerance, Decreased balance, Decreased endurance, Decreased safety awareness, Pain, Improper body mechanics, Postural dysfunction, Increased fascial restricitons, Increased muscle spasms, Decreased range of motion, Decreased strength, Hypomobility, Difficulty walking, Decreased mobility  Visit Diagnosis: Muscle weakness  (generalized)  Difficulty in walking, not elsewhere classified  Midline low back pain without sciatica  Problem List Patient Active Problem List   Diagnosis Date Noted  . Enteritis due to Clostridium difficile   . Discitis of lumbar region   . Osteomyelitis of lumbar vertebra ([place])   . Hyperlipidemia   . CKD (chronic kidney disease), stage III 01/13/2015  . Lumbar spinal stenosis 06/26/2014  . BPH (benign prostatic hyperplasia) 06/24/2014  . B12 deficiency 12/12/2008  . Diabetic polyneuropathy ([place]) 12/12/2008  . ERECTILE DYSFUNCTION 03/18/2008  . Chronic lymphocytic leukemia ([place]) 03/08/2007  . Diabetes mellitus type II, controlled ([place]) 03/08/2007  . Gout 03/08/2007  . Glaucoma 03/08/2007  . Essential hypertension 03/08/2007  . Osteoarthritis 03/08/2007    Darrel Hoover PT 08/17/2015, 3:14 PM  [place] [address] [place], [place], [address] Phone: [phone]   Fax:  [phone]  Name: Kevin Rogers MRN: [id number] Date of Birth: 1931-05-03

## 2015-08-19 ENCOUNTER — Ambulatory Visit: Payer: Medicare Other

## 2015-08-19 DIAGNOSIS — Z7409 Other reduced mobility: Secondary | ICD-10-CM | POA: Diagnosis not present

## 2015-08-19 DIAGNOSIS — M6281 Muscle weakness (generalized): Secondary | ICD-10-CM | POA: Diagnosis not present

## 2015-08-19 DIAGNOSIS — R262 Difficulty in walking, not elsewhere classified: Secondary | ICD-10-CM | POA: Diagnosis not present

## 2015-08-19 DIAGNOSIS — M545 Low back pain, unspecified: Secondary | ICD-10-CM

## 2015-08-19 NOTE — Therapy (Signed)
[place], [place], [address] Phone: [phone]   Fax:  [phone]  Physical Therapy Treatment  Patient Details  Name: Kevin Rogers MRN: [id number] Date of Birth: 1931/10/30 Referring Provider: Verlin Grills  Encounter Date: 08/19/2015      PT End of Session - 08/19/15 1427    Visit Number 10   Number of Visits 24   Date for PT Re-Evaluation 09/14/15   PT Start Time 0215   PT Stop Time 0305   PT Time Calculation (min) 50 min   Activity Tolerance Patient tolerated treatment well   Behavior During Therapy [facility] for tasks assessed/performed      Past Medical History  Diagnosis Date  . Diabetes mellitus type 2 with complications ([place]) 0000000  . Hyperlipidemia   . Hypertension   . Leukemia, chronic ([place]) 2001    does not take treatment just has blood levels checked once a year  . Glaucoma     left eye  . Gout   . Acute bronchitis 09/12/2007    Qualifier: Diagnosis of  By: [name] MD, [name]     Past Surgical History  Procedure Laterality Date  . Lumbar laminectomy  1997  . Hernia repair  1989  . Tonsillectomy    . Colonoscopy w/ polypectomy    . Dental surgery      implanted teeth x 2  . Anterior lat lumbar fusion Left 01/03/2013    Procedure: ANTERIOR LATERAL LUMBAR FUSION lumbar three-four;  Surgeon: [name], MD;  Location: [place] [facility];  Service: Neurosurgery;  Laterality: Left;  . Lumbar percutaneous pedicle screw 1 level  01/03/2013    Procedure: LUMBAR PERCUTANEOUS PEDICLE SCREWS LUMBAR THREE-FOUR;  Surgeon: [name], MD;  Location: [facility];  Service: Neurosurgery;;  . Back surgery      lumbar fusion - 2016, feb  . Lumbar wound debridement N/A 07/23/2014    Procedure: LUMBAR WOUND DEBRIDEMENT;  Surgeon: [name], MD;  Location: [place] [facility];  Service: Neurosurgery;  Laterality: N/A;    There were no vitals filed for this visit.                       [place] Adult PT Treatment/Exercise - 08/19/15 0001    Ambulation/Gait   Gait Comments  3 walks with SPC 2 for 110 feet and one at 250 feet with +1 CG  HR max 100 and one incident of  LT foot catching carpet needing  min to mod asssit to stabilize   Lumbar Exercises: Supine   Clam Limitations single leg control with abdomenal set verbal cues for ab set.    Bent Knee Raise 15 reps   Bent Knee Raise Limitations RT and Lt with verbal cue to pull abs with leg lift.   Bridge 15 reps  small movements    Straight Leg Raise 10 reps   Other Supine Lumbar Exercises knee extension alternate RT /Lt x 15  each with ab set in hook lye supine, then abduction both legs green band clam then lower trun  rotation green band x 15 RT and LT   Knee/Hip Exercises: Aerobic   Nustep L6  7 min LE only   Knee/Hip Exercises: Seated   Long Arc Quad Right;Left;15 reps;Strengthening   Long Arc Quad Weight 6 lbs.  5 sec hold      Ended with walk 200 feet to car with SPC with CGA  PT Short Term Goals - 08/12/15 1716    PT SHORT TERM GOAL #1   Title Pt will be independent with initial HEP for continued strengthening and mobility by 08/21/15    Status Achieved   PT SHORT TERM GOAL #2   Title Lumbar extension AROM improved to -10 degrees and pain - free by 08/21/15.     Status Achieved   PT SHORT TERM GOAL #3   Title Pt will be able to demo posture and body mechanics as it relates to lumbar spine by 08/21/15.    Baseline cues for sitting/standing posture   Time 4   Period Weeks   Status On-going           PT Long Term Goals - 08/17/15 1512    PT LONG TERM GOAL #1   Title FOTO score will improve from 50% limited to 45% to demo improved function and mobility by 09/15/15.     Status Unable to assess   PT LONG TERM GOAL #2   Title H/S flexibility 90/90 measures will improve by at least 10 degrees bilat, to -35 on R and -40 on L, in order for pt to tolerate standing for 1 hour pain- free by  09/15/15.   Status On-going   PT LONG TERM GOAL #3   Title Pt will tolerate standing and walking for 20 mins with pain < 3/10 by 09/15/15.    Status On-going   PT LONG TERM GOAL #4   Title Bilat hip ABD strength will improve to 3/5 in order to stand unsupported in order to perform daily ADLs and grooming by 09/15/15.    Status On-going               Plan - 2015/08/25 1509    Clinical Impression Statement He was able to walk with [facility] with good endurance but still not safe to walk at home safely.  No increased pain or fatigue.    PT Next Visit Plan Contniue LE strength, ,    ,    possible MMT LE.    G-CODE   Consulted and Agree with Plan of Care Patient   Family Member Consulted wife      Patient will benefit from skilled therapeutic intervention in order to improve the following deficits and impairments:  Abnormal gait, Decreased activity tolerance, Decreased balance, Decreased endurance, Decreased safety awareness, Pain, Improper body mechanics, Postural dysfunction, Increased fascial restricitons, Increased muscle spasms, Decreased range of motion, Decreased strength, Hypomobility, Difficulty walking, Decreased mobility  Visit Diagnosis: Muscle weakness (generalized)  Difficulty in walking, not elsewhere classified  Midline low back pain without sciatica       G-Codes - 08/25/15 1525    Functional Assessment Tool Used FOTO 51% limitation   Functional Limitation Mobility: Walking and moving around   Mobility: Walking and Moving Around Current Status [phone]) At least 40 percent but less than 60 percent impaired, limited or restricted   Mobility: Walking and Moving Around Goal Status [phone]) At least 20 percent but less than 40 percent impaired, limited or restricted      Problem List Patient Active Problem List   Diagnosis Date Noted  . Enteritis due to Clostridium difficile   . Discitis of lumbar region   . Osteomyelitis of lumbar vertebra ([place])   . Hyperlipidemia   .  CKD (chronic kidney disease), stage III 01/13/2015  . Lumbar spinal stenosis 06/26/2014  . BPH (benign prostatic hyperplasia) 06/24/2014  . B12 deficiency 12/12/2008  . Diabetic  polyneuropathy ([place]) 12/12/2008  . ERECTILE DYSFUNCTION 03/18/2008  . Chronic lymphocytic leukemia ([place]) 03/08/2007  . Diabetes mellitus type II, controlled ([place]) 03/08/2007  . Gout 03/08/2007  . Glaucoma 03/08/2007  . Essential hypertension 03/08/2007  . Osteoarthritis 03/08/2007    Darrel Hoover  PT 08/19/2015, 3:28 PM  [place] [facility] [address] [place], [place], [address] Phone: [phone]   Fax:  [phone]  Name: Kevin Rogers MRN: [id number] Date of Birth: 08-02-1931

## 2015-08-21 ENCOUNTER — Ambulatory Visit: Payer: Medicare Other | Admitting: Physical Therapy

## 2015-08-21 DIAGNOSIS — M545 Low back pain, unspecified: Secondary | ICD-10-CM

## 2015-08-21 DIAGNOSIS — M6281 Muscle weakness (generalized): Secondary | ICD-10-CM

## 2015-08-21 DIAGNOSIS — R262 Difficulty in walking, not elsewhere classified: Secondary | ICD-10-CM | POA: Diagnosis not present

## 2015-08-21 DIAGNOSIS — Z7409 Other reduced mobility: Secondary | ICD-10-CM | POA: Diagnosis not present

## 2015-08-21 NOTE — Therapy (Addendum)
[place], [place], [address] Phone: [phone]   Fax:  [phone]  Physical Therapy Treatment  Patient Details  Name: Kevin Rogers MRN: [id number] Date of Birth: 03-16-1932 Referring Provider: Verlin Grills  Encounter Date: 08/21/2015      PT End of Session - 08/21/15 1109    Visit Number 11   Number of Visits 24   Date for PT Re-Evaluation 09/14/15   Authorization Type [facility] Medicare   PT Start Time 1102   PT Stop Time 1147   PT Time Calculation (min) 45 min      Past Medical History  Diagnosis Date  . Diabetes mellitus type 2 with complications ([place]) 4356  . Hyperlipidemia   . Hypertension   . Leukemia, chronic ([place]) 2001    does not take treatment just has blood levels checked once a year  . Glaucoma     left eye  . Gout   . Acute bronchitis 09/12/2007    Qualifier: Diagnosis of  By: [name] MD, [name]     Past Surgical History  Procedure Laterality Date  . Lumbar laminectomy  1997  . Hernia repair  1989  . Tonsillectomy    . Colonoscopy w/ polypectomy    . Dental surgery      implanted teeth x 2  . Anterior lat lumbar fusion Left 01/03/2013    Procedure: ANTERIOR LATERAL LUMBAR FUSION lumbar three-four;  Surgeon: [name], MD;  Location: [place] [facility];  Service: Neurosurgery;  Laterality: Left;  . Lumbar percutaneous pedicle screw 1 level  01/03/2013    Procedure: LUMBAR PERCUTANEOUS PEDICLE SCREWS LUMBAR THREE-FOUR;  Surgeon: [name], MD;  Location: [facility];  Service: Neurosurgery;;  . Back surgery      lumbar fusion - 2016, feb  . Lumbar wound debridement N/A 07/23/2014    Procedure: LUMBAR WOUND DEBRIDEMENT;  Surgeon: [name], MD;  Location: [place] [facility];  Service: Neurosurgery;  Laterality: N/A;    There were no vitals filed for this visit.      Subjective Assessment - 08/21/15 1104    Subjective A little pain today   Currently in Pain? Yes   Pain  Score 3   3-4/10   Pain Location Back   Pain Orientation Left;Lower   Pain Descriptors / Indicators Aching;Sore            [facility] PT Assessment - 08/21/15 0001    Observation/Other Assessments   Focus on Therapeutic Outcomes (FOTO)  51% limited from score last visit- decreased from inital 50% limited   Strength   Right Hip ABduction 3/5                     [facility] Adult PT Treatment/Exercise - 08/21/15 0001    Ambulation/Gait   Ambulation/Gait Yes   Ambulation Distance (Feet) 185 Feet x3 with SPC   Gait Comments 3 bouts of gait with SPC and CGA, HR 112 bpm, SPo2 97% on fisrt bout, then checked BP 160/78, then  third bout HR 105, SP02 98%   Lumbar Exercises: Aerobic   Stationary Bike Nustep 5, 5 minutes LE only   Lumbar Exercises: Supine   Clam 20 reps   Clam Limitations green band   Lumbar Exercises: Sidelying   Hip Abduction 10 reps  2 sets bilat                  PT Short Term Goals - 08/21/15  [place] #1   Title Pt will be independent with initial HEP for continued strengthening and mobility by 08/21/15    Time 4   Period Weeks   Status Achieved   PT SHORT TERM GOAL #2   Title Lumbar extension AROM improved to -10 degrees and pain - free by 08/21/15.     Time 4   Period Weeks   Status Achieved   PT SHORT TERM GOAL #3   Title Pt will be able to demo posture and body mechanics as it relates to lumbar spine by 08/21/15.    Baseline cues for sitting/standing posture   Time 4   Period Weeks   Status Partially Met           PT Long Term Goals - 08/21/15 1132    PT LONG TERM GOAL #1   Title FOTO score will improve from 50% limited to 45% to demo improved function and mobility by 09/15/15.     Baseline 51% limited   Time 8   Period Weeks   Status Not Met   PT LONG TERM GOAL #2   Title H/S flexibility 90/90 measures will improve by at least 10 degrees bilat, to -35 on R and -40 on L, in order for pt to tolerate standing for 1  hour pain- free by 09/15/15.   Baseline -35, -32    Time 8   Period Weeks   Status Achieved   PT LONG TERM GOAL #3   Title Pt will tolerate standing and walking for 20 mins with pain < 3/10 by 09/15/15.    Baseline Pain increased 1 point on pain scale , depends on starting pain   Time 8   Period Weeks   Status Partially Met   PT LONG TERM GOAL #4   Title Bilat hip ABD strength will improve to 3/5 in order to stand unsupported in order to perform daily ADLs and grooming by 09/15/15.    Baseline still has UE support   Time 8   Period Weeks   Status Partially Met               Plan - 08/21/15 1153    Clinical Impression Statement Pt able to tolerate 3 bouts of gait with SPC and Supervision/CGA and HR max 112 bpm, SP02 and BP normal for exercise. Pt FOTO score 1% changed on last visit. He reports no more appointments scheduled and he and his wife voice concern about co-pay. Discussed group therex option with him and his wife and Mr Rocks would like to discharge from formal PT and join our group class. He reports walking distance with rollator unchanged however pain is less severe and only increases 1 point iwith 20-30 minutes of ambulation.  He requires cues for posture with gait as he fatigues. His hip abduction strength has improved to 3/5 bilateral. LTG#2 MET, LTG#3,#4 partially MET.    PT Next Visit Plan Discharge to group therex      Patient will benefit from skilled therapeutic intervention in order to improve the following deficits and impairments:  Abnormal gait, Decreased activity tolerance, Decreased balance, Decreased endurance, Decreased safety awareness, Pain, Improper body mechanics, Postural dysfunction, Increased fascial restricitons, Increased muscle spasms, Decreased range of motion, Decreased strength, Hypomobility, Difficulty walking, Decreased mobility  Visit Diagnosis: Muscle weakness (generalized)  Difficulty in walking, not elsewhere classified  Midline low  back pain without sciatica  Impaired mobility and ADLs  Problem List Patient Active Problem List   Diagnosis Date Noted  . Enteritis due to Clostridium difficile   . Discitis of lumbar region   . Osteomyelitis of lumbar vertebra ([place])   . Hyperlipidemia   . CKD (chronic kidney disease), stage III 01/13/2015  . Lumbar spinal stenosis 06/26/2014  . BPH (benign prostatic hyperplasia) 06/24/2014  . B12 deficiency 12/12/2008  . Diabetic polyneuropathy ([place]) 12/12/2008  . ERECTILE DYSFUNCTION 03/18/2008  . Chronic lymphocytic leukemia ([place]) 03/08/2007  . Diabetes mellitus type II, controlled ([place]) 03/08/2007  . Gout 03/08/2007  . Glaucoma 03/08/2007  . Essential hypertension 03/08/2007  . Osteoarthritis 03/08/2007    Dorene Ar, PTA 08/21/2015, 12:04 PM  [facility] [address] [place], [place], [address] Phone: [phone]   Fax:  [phone]  Name: Aaden Buckman MRN: [id number] Date of Birth: Jul 07, 1931    PHYSICAL THERAPY DISCHARGE SUMMARY  Visits from Start of Care: 11  Current functional level related to goals / functional outcomes: See above   Remaining deficits: He continues with LE weakness limiting independence with mobility with unilateral device.    Education / Equipment: HEP  Plan: Patient agrees to discharge.  Patient goals were partially met. Patient is being discharged due to financial reasons.  ?????   Darrel Hoover, PT       09/07/15           7:03 AM

## 2015-08-26 DIAGNOSIS — M4806 Spinal stenosis, lumbar region: Secondary | ICD-10-CM | POA: Diagnosis not present

## 2015-08-26 DIAGNOSIS — I10 Essential (primary) hypertension: Secondary | ICD-10-CM | POA: Diagnosis not present

## 2015-09-03 ENCOUNTER — Ambulatory Visit: Payer: Medicare Other | Admitting: Internal Medicine

## 2015-10-12 DIAGNOSIS — N5202 Corporo-venous occlusive erectile dysfunction: Secondary | ICD-10-CM | POA: Diagnosis not present

## 2015-10-14 DIAGNOSIS — M4806 Spinal stenosis, lumbar region: Secondary | ICD-10-CM | POA: Diagnosis not present

## 2015-10-14 DIAGNOSIS — I10 Essential (primary) hypertension: Secondary | ICD-10-CM | POA: Diagnosis not present

## 2015-10-26 ENCOUNTER — Other Ambulatory Visit: Payer: Self-pay | Admitting: Family Medicine

## 2015-12-02 DIAGNOSIS — M4806 Spinal stenosis, lumbar region: Secondary | ICD-10-CM | POA: Diagnosis not present

## 2015-12-08 ENCOUNTER — Other Ambulatory Visit (HOSPITAL_BASED_OUTPATIENT_CLINIC_OR_DEPARTMENT_OTHER): Payer: Medicare Other

## 2015-12-08 ENCOUNTER — Encounter: Payer: Self-pay | Admitting: Oncology

## 2015-12-08 ENCOUNTER — Ambulatory Visit (HOSPITAL_BASED_OUTPATIENT_CLINIC_OR_DEPARTMENT_OTHER): Payer: Medicare Other | Admitting: Oncology

## 2015-12-08 ENCOUNTER — Telehealth: Payer: Self-pay | Admitting: Oncology

## 2015-12-08 VITALS — BP 132/73 | HR 65 | Temp 98.1°F | Resp 18 | Ht 72.0 in | Wt 246.3 lb

## 2015-12-08 DIAGNOSIS — D72820 Lymphocytosis (symptomatic): Secondary | ICD-10-CM

## 2015-12-08 DIAGNOSIS — C9110 Chronic lymphocytic leukemia of B-cell type not having achieved remission: Secondary | ICD-10-CM

## 2015-12-08 LAB — CBC WITH DIFFERENTIAL/PLATELET
BASO%: 0.1 % (ref 0.0–2.0)
Basophils Absolute: 0 10*3/uL (ref 0.0–0.1)
EOS%: 0.5 % (ref 0.0–7.0)
Eosinophils Absolute: 0.1 10*3/uL (ref 0.0–0.5)
HCT: 41.2 % (ref 38.4–49.9)
HGB: 13.2 g/dL (ref 13.0–17.1)
LYMPH%: 77.2 % — ABNORMAL HIGH (ref 14.0–49.0)
MCH: 29.7 pg (ref 27.2–33.4)
MCHC: 32.1 g/dL (ref 32.0–36.0)
MCV: 92.5 fL (ref 79.3–98.0)
MONO#: 0.2 10*3/uL (ref 0.1–0.9)
MONO%: 1.4 % (ref 0.0–14.0)
NEUT#: 3.6 10*3/uL (ref 1.5–6.5)
NEUT%: 20.8 % — ABNORMAL LOW (ref 39.0–75.0)
Platelets: 174 10*3/uL (ref 140–400)
RBC: 4.45 10*6/uL (ref 4.20–5.82)
RDW: 14.9 % — ABNORMAL HIGH (ref 11.0–14.6)
WBC: 17.1 10*3/uL — ABNORMAL HIGH (ref 4.0–10.3)
lymph#: 13.2 10*3/uL — ABNORMAL HIGH (ref 0.9–3.3)

## 2015-12-08 LAB — COMPREHENSIVE METABOLIC PANEL
ALT: 19 U/L (ref 0–55)
AST: 15 U/L (ref 5–34)
Albumin: 3.8 g/dL (ref 3.5–5.0)
Alkaline Phosphatase: 111 U/L (ref 40–150)
Anion Gap: 8 mEq/L (ref 3–11)
BUN: 23.4 mg/dL (ref 7.0–26.0)
CO2: 23 mEq/L (ref 22–29)
Calcium: 9.7 mg/dL (ref 8.4–10.4)
Chloride: 111 mEq/L — ABNORMAL HIGH (ref 98–109)
Creatinine: 1.5 mg/dL — ABNORMAL HIGH (ref 0.7–1.3)
EGFR: 51 mL/min/{1.73_m2} — ABNORMAL LOW (ref >90)
Glucose: 127 mg/dl (ref 70–140)
Potassium: 4.7 mEq/L (ref 3.5–5.1)
Sodium: 142 mEq/L (ref 136–145)
Total Bilirubin: 1.01 mg/dL (ref 0.20–1.20)
Total Protein: 6.7 g/dL (ref 6.4–8.3)

## 2015-12-08 LAB — TECHNOLOGIST REVIEW

## 2015-12-08 NOTE — Telephone Encounter (Signed)
Gave pt cal & avs °

## 2015-12-08 NOTE — Progress Notes (Signed)
[facility] PROGRESS NOTE 12/08/15   DIAGNOSIS: 82 year old gentleman diagnosed with lymphocytosis diagnosed in October 2014. Differential diagnosis include the lymphoproliferative disorder such as a CLL.  CURRENT THERAPY: Observation and surveillance.  INTERVAL HISTORY:  Mr. Laton presents today for a follow-up visit with his wife. Since his last visit, he reports no changes in his health. He has completely recovered from his  osteomyelitis of the lumbar spine. He is ambulating without any major difficulties and have not had any recent hospitalization or illnesses. Has not reported any relapse of that illness. He denied any fevers or chills or sweats. He denied any lymphadenopathy or constitutional symptoms. His appetite remains excellent and his weight is stable.  He denies any headaches, blurry vision, syncope or seizures. He does not report any chest pain, palpitation, orthopnea or leg edema. Does not report any cough, wheezing or hemoptysis. He does not report any abdominal pain, nausea, vomiting, constipation, diarrhea. He does not report any dysuria, urinary frequency or hematuria.The remainder review of systems unremarkable.  MEDICAL HISTORY: Past Medical History:  Diagnosis Date  . Acute bronchitis 09/12/2007   Qualifier: Diagnosis of  By: [name] MD, [name]   . Diabetes mellitus type 2 with complications ([place]) 0000000  . Glaucoma    left eye  . Gout   . Hyperlipidemia   . Hypertension   . Leukemia, chronic ([place]) 2001   does not take treatment just has blood levels checked once a year    IALLERGIES:  has No Known Allergies.  MEDICATIONS: has a current medication list which includes the following prescription(s): acetaminophen, allopurinol, finasteride, glipizide, glucose blood, losartan, meloxicam, one touch lancets, tamsulosin, travatan z, and vitamin b-12.   PHYSICAL EXAMINATION: ECOG PERFORMANCE STATUS: 1  Blood pressure 132/73, pulse 65,  temperature 98.1 F (36.7 C), temperature source Oral, resp. rate 18, height 6' (1.829 m), weight 246 lb 4.8 oz (111.7 kg), SpO2 97 %.  GENERAL: Well-appearing gentleman without distress. SKIN: no rashes or significant lesions without petechiae. EYES: Sclerae is anicteric. Oral mucosa was without any thrush or ulcers. NECK: supple, thyroid normal size, non-tender, without nodularity LYMPH:  no palpable lymphadenopathy in the cervical, axillary or supraclavicular LUNGS: clear to auscultation and percussion without rhonchi, or wheezes. HEART: regular rate & rhythm and no murmurs rubs or gallops. ABDOMEN:abdomen soft, non-tender and normal bowel sounds no splenomegaly. Musculoskeletal:no cyanosis of digits and no clubbing  NEURO: alert & oriented x 3 with fluent speech, no focal motor/sensory deficits  LABORATORY DATA: Results for orders placed or performed in visit on 12/08/15 (from the past 48 hour(s))  CBC with Differential/Platelet     Status: Abnormal   Collection Time: 12/08/15  8:29 AM  Result Value Ref Range   WBC 17.1 (H) 4.0 - 10.3 10e3/uL   NEUT# 3.6 1.5 - 6.5 10e3/uL   HGB 13.2 13.0 - 17.1 g/dL   HCT 41.2 38.4 - 49.9 %   Platelets 174 140 - 400 10e3/uL   MCV 92.5 79.3 - 98.0 fL   MCH 29.7 27.2 - 33.4 pg   MCHC 32.1 32.0 - 36.0 g/dL   RBC 4.45 4.20 - 5.82 10e6/uL   RDW 14.9 (H) 11.0 - 14.6 %   lymph# 13.2 (H) 0.9 - 3.3 10e3/uL   MONO# 0.2 0.1 - 0.9 10e3/uL   Eosinophils Absolute 0.1 0.0 - 0.5 10e3/uL   Basophils Absolute 0.0 0.0 - 0.1 10e3/uL   NEUT% 20.8 (L) 39.0 - 75.0 %   LYMPH% 77.2 (  H) 14.0 - 49.0 %   MONO% 1.4 0.0 - 14.0 %   EOS% 0.5 0.0 - 7.0 %   BASO% 0.1 0.0 - 2.0 %       ASSESSMENT: [name] 82 y.o. male with:   1. 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 This was initially diagnosed in 2014 although his lymphocytosis dates back to 2007.   His laboratory data today continues to be close to his  baseline of around 16 or 17,000 with lymphocytosis. The plan is to continue with active surveillance and repeat laboratory testing in 6 months. I will also obtain peripheral blood flow cytometry for better quantification of his lymphocyte morphology. I see no indication for active treatment at this time given the chronicity of his lymphocytosis and likely symptoms.  2. Osteomyelitis of the lumbar spine: Appears to have recovered well that evidence of relapse.  3. Follow-up: Will be in 6 months.  [facility] MD 12/08/15

## 2015-12-10 DIAGNOSIS — H401122 Primary open-angle glaucoma, left eye, moderate stage: Secondary | ICD-10-CM | POA: Diagnosis not present

## 2015-12-10 LAB — HM DIABETES EYE EXAM

## 2016-01-18 ENCOUNTER — Ambulatory Visit (INDEPENDENT_AMBULATORY_CARE_PROVIDER_SITE_OTHER): Payer: Medicare Other | Admitting: Family Medicine

## 2016-01-18 ENCOUNTER — Encounter: Payer: Self-pay | Admitting: Family Medicine

## 2016-01-18 VITALS — BP 122/76 | HR 86 | Temp 98.0°F | Wt 249.8 lb

## 2016-01-18 DIAGNOSIS — E1122 Type 2 diabetes mellitus with diabetic chronic kidney disease: Secondary | ICD-10-CM | POA: Diagnosis not present

## 2016-01-18 DIAGNOSIS — N182 Chronic kidney disease, stage 2 (mild): Secondary | ICD-10-CM | POA: Diagnosis not present

## 2016-01-18 DIAGNOSIS — N183 Chronic kidney disease, stage 3 unspecified: Secondary | ICD-10-CM

## 2016-01-18 DIAGNOSIS — I10 Essential (primary) hypertension: Secondary | ICD-10-CM | POA: Diagnosis not present

## 2016-01-18 DIAGNOSIS — M1009 Idiopathic gout, multiple sites: Secondary | ICD-10-CM | POA: Diagnosis not present

## 2016-01-18 DIAGNOSIS — Z23 Encounter for immunization: Secondary | ICD-10-CM | POA: Diagnosis not present

## 2016-01-18 LAB — POCT GLYCOSYLATED HEMOGLOBIN (HGB A1C): Hemoglobin A1C: 6

## 2016-01-18 NOTE — Progress Notes (Signed)
Subjective:  Kevin Rogers is a 82 y.o. year old very pleasant male patient who presents for/with See problem oriented charting ROS- no chest pain or shortness of breath, some swelling in legs- chronic.see any ROS included in HPI as well.   Past Medical History-  Patient Active Problem List   Diagnosis Date Noted  . Osteomyelitis of lumbar vertebra (HCC)     Priority: High  . Chronic lymphocytic leukemia ([place]) 03/08/2007    Priority: High  . Diabetes mellitus type II, controlled ([place]) 03/08/2007    Priority: High  . Hyperlipidemia     Priority: Medium  . CKD (chronic kidney disease), stage III 01/13/2015    Priority: Medium  . Lumbar spinal stenosis 06/26/2014    Priority: Medium  . BPH (benign prostatic hyperplasia) 06/24/2014    Priority: Medium  . Gout 03/08/2007    Priority: Medium  . Essential hypertension 03/08/2007    Priority: Medium  . Enteritis due to Clostridium difficile     Priority: Low  . B12 deficiency 12/12/2008    Priority: Low  . Diabetic polyneuropathy ([place]) 12/12/2008    Priority: Low  . ERECTILE DYSFUNCTION 03/18/2008    Priority: Low  . Glaucoma 03/08/2007    Priority: Low  . Osteoarthritis 03/08/2007    Priority: Low  . Discitis of lumbar region     Medications- reviewed and updated Current Outpatient Prescriptions  Medication Sig Dispense Refill  . acetaminophen (TYLENOL) 500 MG tablet Take 500-1,000 mg by mouth every 6 (six) hours as needed (pain). Reported on 07/15/2015    . allopurinol (ZYLOPRIM) 300 MG tablet Take 1 tablet by mouth  daily 90 tablet 3  . finasteride (PROSCAR) 5 MG tablet Take 1 tablet (5 mg total) by mouth daily. 90 tablet 3  . glipiZIDE (GLUCOTROL XL) 5 MG 24 hr tablet TAKE 1 TABLET (5 MG TOTAL) BY MOUTH DAILY. 90 tablet 2  . glucose blood (ONETOUCH VERIO) test strip Use to test blood sugars daily. Dx: E11.9 100 each 12  . losartan (COZAAR) 100 MG tablet Take 1 tablet by mouth  daily 90 tablet 2  . meloxicam (MOBIC) 15 MG  tablet Take 1 tablet by mouth  daily 90 tablet 3  . ONE TOUCH LANCETS MISC Use to test blood sugars. Dx:E11.9 100 each 11  . tamsulosin (FLOMAX) 0.4 MG CAPS capsule Take 1 capsule by mouth  daily 90 capsule 3  . TRAVATAN Z 0.004 % SOLN ophthalmic solution Place 1 drop into the left eye at bedtime.     . vitamin B-12 (CYANOCOBALAMIN) 1000 MCG tablet Take 1,000 mcg by mouth daily.     No current facility-administered medications for this visit.     Objective: BP 122/76 (BP Location: Left Arm, Patient Position: Sitting, Cuff Size: Large)   Pulse 86   Temp 98 F (36.7 C) (Oral)   Wt 249 lb 12.8 oz (113.3 kg)   SpO2 95%   BMI 33.88 kg/m  Gen: NAD, resting comfortably CV: RRR no murmurs rubs or gallops Lungs: CTAB no crackles, wheeze, rhonchi Abdomen: soft/nontender/nondistended/normal bowel sounds. No rebound or guarding.  Ext: 1+ edema Skin: warm, dry Neuro: grossly normal, moves all extremitie  Assessment/Plan:  Come off meloxicam with kidneys worsening- may promote worsening edema. Return precautions around edema given.    Diabetes mellitus type II, controlled S: well controlled in past. On glipizide 5mg  24 hr. No hypoglycemia Lab Results  Component Value Date   HGBA1C 5.6 07/08/2015   HGBA1C 5.6 01/13/2015  HGBA1C 5.9 06/24/2014   A/P: update a1c today  CKD (chronic kidney disease), stage III S: creatinine has bumped slightly to 1.5 from 1.2-1.4 range on labs last month with oncology A/P: will stop meloxicam given worsening- recheck 6 months   Essential hypertension S: controlled on losartan 50mg  (half of 100mg ) BP Readings from Last 3 Encounters:  01/18/16 122/76  12/08/15 132/73  07/15/15 122/74  A/P:Continue current meds:  Doing well   Gout S: no flares on allopurinol 300mg  Lab Results  Component Value Date   LABURIC 4.2 (L) 02/10/2015  A/P: consider allopurinol recheck in 6 months, no changes today   BPH (benign prostatic hyperplasia) S: urgency  improved. Nocturia from twice a night to once a night with recent start of finasteride A/P: continue current medicine   Return in about 6 months (around 07/17/2016).  CPE  Orders Placed This Encounter  Procedures  . Flu vaccine HIGH DOSE PF  . POCT glycosylated hemoglobin (Hb A1C)   Lab Results  Component Value Date   HGBA1C 6.0 01/18/2016   Return precautions advised.  Garret Reddish, MD

## 2016-01-18 NOTE — Assessment & Plan Note (Signed)
S: well controlled in past. On glipizide 5mg  24 hr. No hypoglycemia Lab Results  Component Value Date   HGBA1C 5.6 07/08/2015   HGBA1C 5.6 01/13/2015   HGBA1C 5.9 06/24/2014   A/P: update a1c today

## 2016-01-18 NOTE — Assessment & Plan Note (Signed)
S: controlled on losartan 50mg  (half of 100mg ) BP Readings from Last 3 Encounters:  01/18/16 122/76  12/08/15 132/73  07/15/15 122/74  A/P:Continue current meds:  Doing well

## 2016-01-18 NOTE — Patient Instructions (Addendum)
will stop meloxicam given worsening of kidney function- recheck 6 months.would call Dr. Brien Few to see if anything else he can do to help the back as pain may worsen off meloxicam  Glad Finasteride is helping  a1c up slightly to 6- no changes in meds- recheck next visit Lab Results  Component Value Date   HGBA1C 5.6 07/08/2015   Flu shot today  If you have had your eye exam within the last year, please sign release of information at check out desk. If you have not had an eye exam within a year, please get one at this time as this is important for your diabetes care

## 2016-01-18 NOTE — Assessment & Plan Note (Signed)
S: urgency improved. Nocturia from twice a night to once a night with recent start of finasteride A/P: continue current medicine

## 2016-01-18 NOTE — Progress Notes (Signed)
Pre visit review using our clinic review tool, if applicable. No additional management support is needed unless otherwise documented below in the visit note. 

## 2016-01-18 NOTE — Assessment & Plan Note (Signed)
S: no flares on allopurinol 300mg  Lab Results  Component Value Date   LABURIC 4.2 (L) 02/10/2015  A/P: consider allopurinol recheck in 6 months, no changes today

## 2016-01-18 NOTE — Assessment & Plan Note (Signed)
S: creatinine has bumped slightly to 1.5 from 1.2-1.4 range on labs last month with oncology A/P: will stop meloxicam given worsening- recheck 6 months

## 2016-01-19 LAB — HM DIABETES EYE EXAM

## 2016-01-20 ENCOUNTER — Encounter: Payer: Self-pay | Admitting: Family Medicine

## 2016-01-26 DIAGNOSIS — M47817 Spondylosis without myelopathy or radiculopathy, lumbosacral region: Secondary | ICD-10-CM | POA: Diagnosis not present

## 2016-01-26 DIAGNOSIS — M4806 Spinal stenosis, lumbar region: Secondary | ICD-10-CM | POA: Diagnosis not present

## 2016-01-26 DIAGNOSIS — I10 Essential (primary) hypertension: Secondary | ICD-10-CM | POA: Diagnosis not present

## 2016-01-27 DIAGNOSIS — M47817 Spondylosis without myelopathy or radiculopathy, lumbosacral region: Secondary | ICD-10-CM | POA: Diagnosis not present

## 2016-01-27 DIAGNOSIS — M47814 Spondylosis without myelopathy or radiculopathy, thoracic region: Secondary | ICD-10-CM | POA: Diagnosis not present

## 2016-02-23 DIAGNOSIS — M961 Postlaminectomy syndrome, not elsewhere classified: Secondary | ICD-10-CM | POA: Diagnosis not present

## 2016-02-23 DIAGNOSIS — Z79899 Other long term (current) drug therapy: Secondary | ICD-10-CM | POA: Diagnosis not present

## 2016-02-23 DIAGNOSIS — M47817 Spondylosis without myelopathy or radiculopathy, lumbosacral region: Secondary | ICD-10-CM | POA: Diagnosis not present

## 2016-02-23 DIAGNOSIS — M48061 Spinal stenosis, lumbar region without neurogenic claudication: Secondary | ICD-10-CM | POA: Diagnosis not present

## 2016-02-25 ENCOUNTER — Other Ambulatory Visit: Payer: Self-pay | Admitting: Family Medicine

## 2016-03-02 DIAGNOSIS — M47817 Spondylosis without myelopathy or radiculopathy, lumbosacral region: Secondary | ICD-10-CM | POA: Diagnosis not present

## 2016-04-19 DIAGNOSIS — M47817 Spondylosis without myelopathy or radiculopathy, lumbosacral region: Secondary | ICD-10-CM | POA: Diagnosis not present

## 2016-04-19 DIAGNOSIS — M961 Postlaminectomy syndrome, not elsewhere classified: Secondary | ICD-10-CM | POA: Diagnosis not present

## 2016-04-19 DIAGNOSIS — Z79899 Other long term (current) drug therapy: Secondary | ICD-10-CM | POA: Diagnosis not present

## 2016-04-19 DIAGNOSIS — M48061 Spinal stenosis, lumbar region without neurogenic claudication: Secondary | ICD-10-CM | POA: Diagnosis not present

## 2016-04-27 DIAGNOSIS — M47817 Spondylosis without myelopathy or radiculopathy, lumbosacral region: Secondary | ICD-10-CM | POA: Diagnosis not present

## 2016-06-08 DIAGNOSIS — H401131 Primary open-angle glaucoma, bilateral, mild stage: Secondary | ICD-10-CM | POA: Diagnosis not present

## 2016-06-08 DIAGNOSIS — H5203 Hypermetropia, bilateral: Secondary | ICD-10-CM | POA: Diagnosis not present

## 2016-06-08 DIAGNOSIS — H524 Presbyopia: Secondary | ICD-10-CM | POA: Diagnosis not present

## 2016-06-08 DIAGNOSIS — E119 Type 2 diabetes mellitus without complications: Secondary | ICD-10-CM | POA: Diagnosis not present

## 2016-06-09 ENCOUNTER — Ambulatory Visit (HOSPITAL_BASED_OUTPATIENT_CLINIC_OR_DEPARTMENT_OTHER): Payer: Medicare Other | Admitting: Oncology

## 2016-06-09 ENCOUNTER — Other Ambulatory Visit (HOSPITAL_BASED_OUTPATIENT_CLINIC_OR_DEPARTMENT_OTHER): Payer: Medicare Other

## 2016-06-09 ENCOUNTER — Telehealth: Payer: Self-pay | Admitting: Oncology

## 2016-06-09 ENCOUNTER — Other Ambulatory Visit (HOSPITAL_COMMUNITY)
Admission: RE | Admit: 2016-06-09 | Discharge: 2016-06-09 | Disposition: A | Discharge status: Home / Self Care | Payer: Medicare Other | Source: Ambulatory Visit | Attending: Oncology | Admitting: Oncology

## 2016-06-09 VITALS — BP 101/75 | HR 81 | Temp 97.7°F | Resp 18 | Ht 72.0 in | Wt 253.3 lb

## 2016-06-09 DIAGNOSIS — D72820 Lymphocytosis (symptomatic): Secondary | ICD-10-CM | POA: Diagnosis not present

## 2016-06-09 DIAGNOSIS — C9110 Chronic lymphocytic leukemia of B-cell type not having achieved remission: Secondary | ICD-10-CM | POA: Insufficient documentation

## 2016-06-09 LAB — COMPREHENSIVE METABOLIC PANEL
ALT: 13 U/L (ref 0–55)
AST: 13 U/L (ref 5–34)
Albumin: 3.9 g/dL (ref 3.5–5.0)
Alkaline Phosphatase: 93 U/L (ref 40–150)
Anion Gap: 9 mEq/L (ref 3–11)
BUN: 18.2 mg/dL (ref 7.0–26.0)
CO2: 26 mEq/L (ref 22–29)
Calcium: 10.2 mg/dL (ref 8.4–10.4)
Chloride: 108 mEq/L (ref 98–109)
Creatinine: 1.3 mg/dL (ref 0.7–1.3)
EGFR: 57 mL/min/{1.73_m2} — ABNORMAL LOW (ref >90)
Glucose: 132 mg/dl (ref 70–140)
Potassium: 4.5 mEq/L (ref 3.5–5.1)
Sodium: 143 mEq/L (ref 136–145)
Total Bilirubin: 1.12 mg/dL (ref 0.20–1.20)
Total Protein: 6.7 g/dL (ref 6.4–8.3)

## 2016-06-09 LAB — CBC WITH DIFFERENTIAL/PLATELET
BASO%: 0.1 % (ref 0.0–2.0)
Basophils Absolute: 0 10*3/uL (ref 0.0–0.1)
EOS%: 0.5 % (ref 0.0–7.0)
Eosinophils Absolute: 0.1 10*3/uL (ref 0.0–0.5)
HCT: 39.8 % (ref 38.4–49.9)
HGB: 12.9 g/dL — ABNORMAL LOW (ref 13.0–17.1)
LYMPH%: 73.2 % — ABNORMAL HIGH (ref 14.0–49.0)
MCH: 29.7 pg (ref 27.2–33.4)
MCHC: 32.4 g/dL (ref 32.0–36.0)
MCV: 91.5 fL (ref 79.3–98.0)
MONO#: 0.2 10*3/uL (ref 0.1–0.9)
MONO%: 1.5 % (ref 0.0–14.0)
NEUT#: 3.3 10*3/uL (ref 1.5–6.5)
NEUT%: 24.7 % — ABNORMAL LOW (ref 39.0–75.0)
Platelets: 160 10*3/uL (ref 140–400)
RBC: 4.35 10*6/uL (ref 4.20–5.82)
RDW: 14.2 % (ref 11.0–14.6)
WBC: 13.6 10*3/uL — ABNORMAL HIGH (ref 4.0–10.3)
lymph#: 9.9 10*3/uL — ABNORMAL HIGH (ref 0.9–3.3)

## 2016-06-09 NOTE — Telephone Encounter (Signed)
Appointments scheduled per 2/8 LOS. Patient given AVS report and calendars with future scheduled appointments.  °

## 2016-06-09 NOTE — Progress Notes (Signed)
[facility] PROGRESS NOTE 06/09/16   DIAGNOSIS: 83 year old gentleman diagnosed with lymphocytosis diagnosed in October 2014. Differential diagnosis include the lymphoproliferative disorder such as a CLL.  CURRENT THERAPY: Observation and surveillance.  INTERVAL HISTORY:  Kevin Rogers presents today for a follow-up visit with his wife. Since his last visit, he continues to be stable without any recent complaints. He is ambulating without any major difficulties and have not had any recent hospitalization or illnesses. He does use a walker for long distances and denies any falls or syncope.    He denied any fevers or chills or sweats. He denied any lymphadenopathy or constitutional symptoms. He denies any excessive fatigue or appetite changes. He has gained weight since last visit.  He denies any headaches, blurry vision, syncope or seizures. He does not report any chest pain, palpitation, orthopnea or leg edema. Does not report any cough, wheezing or hemoptysis. He does not report any abdominal pain, nausea, vomiting, constipation, diarrhea. He does not report any dysuria, urinary frequency or hematuria.The remainder review of systems unremarkable.  MEDICAL HISTORY: Past Medical History:  Diagnosis Date  . Acute bronchitis 09/12/2007   Qualifier: Diagnosis of  By: [name] MD, [name]   . Diabetes mellitus type 2 with complications ([place]) 0000000  . Glaucoma    left eye  . Gout   . Hyperlipidemia   . Hypertension   . Leukemia, chronic ([place]) 2001   does not take treatment just has blood levels checked once a year    IALLERGIES:  has No Known Allergies.  MEDICATIONS: has a current medication list which includes the following prescription(s): acetaminophen, allopurinol, finasteride, glipizide, glucose blood, losartan, meloxicam, one touch lancets, tamsulosin, travatan z, and vitamin b-12.   PHYSICAL EXAMINATION: ECOG PERFORMANCE STATUS: 1  Blood pressure  101/75, pulse 81, temperature 97.7 F (36.5 C), temperature source Oral, resp. rate 18, height 6' (1.829 m), weight 253 lb 4.8 oz (114.9 kg), SpO2 100 %.  GENERAL: Alert, awake gentleman without distress. SKIN: no rashes or significant lesions without petechiae. EYES: Sclerae is anicteric. Oral mucosa without thrush or ulcers. NECK: supple, thyroid normal size, non-tender, without nodularity LYMPH:  no palpable lymphadenopathy in the cervical, axillary or supraclavicular LUNGS: clear to auscultation and percussion without rhonchi, or wheezes. HEART: regular rate & rhythm and no murmurs rubs or gallops. ABDOMEN:abdomen soft, non-tender and normal bowel sounds no rebound or guarding Musculoskeletal:no cyanosis of digits and no clubbing  NEURO: alert & oriented x 3 with fluent speech, no focal motor/sensory deficits  LABORATORY DATA: Results for orders placed or performed in visit on 12/08/15 (from the past 48 hour(s))  CBC with Differential/Platelet     Status: Abnormal   Collection Time: 06/09/16  9:34 AM  Result Value Ref Range   WBC 13.6 (H) 4.0 - 10.3 10e3/uL   NEUT# 3.3 1.5 - 6.5 10e3/uL   HGB 12.9 (L) 13.0 - 17.1 g/dL   HCT 39.8 38.4 - 49.9 %   Platelets 160 140 - 400 10e3/uL   MCV 91.5 79.3 - 98.0 fL   MCH 29.7 27.2 - 33.4 pg   MCHC 32.4 32.0 - 36.0 g/dL   RBC 4.35 4.20 - 5.82 10e6/uL   RDW 14.2 11.0 - 14.6 %   lymph# 9.9 (H) 0.9 - 3.3 10e3/uL   MONO# 0.2 0.1 - 0.9 10e3/uL   Eosinophils Absolute 0.1 0.0 - 0.5 10e3/uL   Basophils Absolute 0.0 0.0 - 0.1 10e3/uL   NEUT% 24.7 (L) 39.0 -  75.0 %   LYMPH% 73.2 (H) 14.0 - 49.0 %   MONO% 1.5 0.0 - 14.0 %   EOS% 0.5 0.0 - 7.0 %   BASO% 0.1 0.0 - 2.0 %       ASSESSMENT: Kevin Rogers 83 y.o. male with:   1. 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 This was initially diagnosed in 2014 with his lymphocytosis dates back to 2007.   His white cell count today is 13,000 with his  lymphocytosis was less than what it was 6 months ago. The plan is to continue with active surveillance at this time without any indication for treatment. Indication for treatment were reviewed today which include rapid increase in his lymphocyte count, cytopenia or constitutional symptoms.  2. Osteomyelitis of the lumbar spine: Appears to have recovered well that evidence of relapse.  3. Follow-up: Will be in 8 months.  [facility] MD 06/09/16

## 2016-06-13 LAB — FLOW CYTOMETRY

## 2016-06-23 DIAGNOSIS — Z79899 Other long term (current) drug therapy: Secondary | ICD-10-CM | POA: Diagnosis not present

## 2016-06-23 DIAGNOSIS — M961 Postlaminectomy syndrome, not elsewhere classified: Secondary | ICD-10-CM | POA: Diagnosis not present

## 2016-06-23 DIAGNOSIS — M47814 Spondylosis without myelopathy or radiculopathy, thoracic region: Secondary | ICD-10-CM | POA: Diagnosis not present

## 2016-06-23 DIAGNOSIS — M47817 Spondylosis without myelopathy or radiculopathy, lumbosacral region: Secondary | ICD-10-CM | POA: Diagnosis not present

## 2016-07-11 ENCOUNTER — Other Ambulatory Visit: Payer: Self-pay | Admitting: Family Medicine

## 2016-07-20 ENCOUNTER — Encounter: Payer: Self-pay | Admitting: Family Medicine

## 2016-07-20 ENCOUNTER — Ambulatory Visit (INDEPENDENT_AMBULATORY_CARE_PROVIDER_SITE_OTHER): Payer: Medicare Other | Admitting: Family Medicine

## 2016-07-20 VITALS — BP 130/76 | HR 81 | Temp 98.1°F | Ht 70.0 in | Wt 255.4 lb

## 2016-07-20 DIAGNOSIS — Z0000 Encounter for general adult medical examination without abnormal findings: Secondary | ICD-10-CM

## 2016-07-20 DIAGNOSIS — M159 Polyosteoarthritis, unspecified: Secondary | ICD-10-CM

## 2016-07-20 DIAGNOSIS — N401 Enlarged prostate with lower urinary tract symptoms: Secondary | ICD-10-CM | POA: Diagnosis not present

## 2016-07-20 DIAGNOSIS — R351 Nocturia: Secondary | ICD-10-CM | POA: Diagnosis not present

## 2016-07-20 DIAGNOSIS — M1009 Idiopathic gout, multiple sites: Secondary | ICD-10-CM | POA: Diagnosis not present

## 2016-07-20 DIAGNOSIS — M150 Primary generalized (osteo)arthritis: Secondary | ICD-10-CM

## 2016-07-20 DIAGNOSIS — M8949 Other hypertrophic osteoarthropathy, multiple sites: Secondary | ICD-10-CM

## 2016-07-20 DIAGNOSIS — Z1211 Encounter for screening for malignant neoplasm of colon: Secondary | ICD-10-CM

## 2016-07-20 DIAGNOSIS — N183 Chronic kidney disease, stage 3 unspecified: Secondary | ICD-10-CM

## 2016-07-20 DIAGNOSIS — E1122 Type 2 diabetes mellitus with diabetic chronic kidney disease: Secondary | ICD-10-CM

## 2016-07-20 DIAGNOSIS — N182 Chronic kidney disease, stage 2 (mild): Secondary | ICD-10-CM

## 2016-07-20 DIAGNOSIS — M461 Sacroiliitis, not elsewhere classified: Secondary | ICD-10-CM | POA: Diagnosis not present

## 2016-07-20 LAB — LIPID PANEL
Cholesterol: 189 mg/dL (ref 0–200)
HDL: 50.8 mg/dL (ref >39.00)
LDL Cholesterol: 112 mg/dL — ABNORMAL HIGH (ref 0–99)
NonHDL: 137.94
Total CHOL/HDL Ratio: 4
Triglycerides: 132 mg/dL (ref 0.0–149.0)
VLDL: 26.4 mg/dL (ref 0.0–40.0)

## 2016-07-20 LAB — HEMOGLOBIN A1C: Hgb A1c MFr Bld: 6 % (ref 4.6–6.5)

## 2016-07-20 NOTE — Assessment & Plan Note (Signed)
OA in shoulders- worse without meloxicam. No significant increase off his meloxicam- is on hydrocodone for back pain through Dr. Brien Few with neurosurgery

## 2016-07-20 NOTE — Progress Notes (Signed)
Pre visit review using our clinic review tool, if applicable. No additional management support is needed unless otherwise documented below in the visit note. 

## 2016-07-20 NOTE — Patient Instructions (Addendum)
Stop by lab to pick up stool cards. Also they will draw blood on you. a1c and cholesterol only since you did other labs recently  Trial off allopurinol. See [place] back if get hot/swollen/painful joint  Definitely see me in 6 months (as long as a1c under 7).   Need to work on weight loss.   As encouragement for weight loss- I would also like for you to sign up for an annual wellness visit with our nurse, Manuela Schwartz, who specializes in the annual wellness exam. This is a free benefit under medicare that may help [place] find additional ways to help you. Some highlights are reviewing medications, lifestyle, and doing a dementia screen.

## 2016-07-20 NOTE — Progress Notes (Signed)
Phone: [phone]  Subjective:  Patient presents today for their annual physical. Chief complaint-noted.   See problem oriented charting- ROS- full  review of systems was completed and negative except nocturia x 1 and continued issues with back pain- getting him to point has to use walker at all times. No chest pain or shortness of breath. No headache or blurry vision.  The following were reviewed and entered/updated in epic: Past Medical History:  Diagnosis Date  . Acute bronchitis 09/12/2007   Qualifier: Diagnosis of  By: [name] MD, [name]   . Diabetes mellitus type 2 with complications ([place]) 2376  . Glaucoma    left eye  . Gout   . Hyperlipidemia   . Hypertension   . Leukemia, chronic ([place]) 2001   does not take treatment just has blood levels checked once a year   Patient Active Problem List   Diagnosis Date Noted  . Chronic lymphocytic leukemia ([place]) 03/08/2007    Priority: High  . Diabetes mellitus type II, controlled ([place]) 03/08/2007    Priority: High  . History of osteomyelitis of lumbar vertebrae     Priority: Medium  . Hyperlipidemia     Priority: Medium  . CKD (chronic kidney disease), stage III 01/13/2015    Priority: Medium  . Lumbar spinal stenosis 06/26/2014    Priority: Medium  . BPH (benign prostatic hyperplasia) 06/24/2014    Priority: Medium  . Gout 03/08/2007    Priority: Medium  . Essential hypertension 03/08/2007    Priority: Medium  . Enteritis due to Clostridium difficile     Priority: Low  . Discitis of lumbar region     Priority: Low  . B12 deficiency 12/12/2008    Priority: Low  . Diabetic polyneuropathy ([place]) 12/12/2008    Priority: Low  . ERECTILE DYSFUNCTION 03/18/2008    Priority: Low  . Glaucoma 03/08/2007    Priority: Low  . Osteoarthritis 03/08/2007    Priority: Low   Past Surgical History:  Procedure Laterality Date  . ANTERIOR LAT LUMBAR FUSION Left 01/03/2013   Procedure: ANTERIOR LATERAL LUMBAR FUSION lumbar  three-four;  Surgeon: [name], MD;  Location: [facility];  Service: Neurosurgery;  Laterality: Left;  . BACK SURGERY     lumbar fusion - 2016, feb  . COLONOSCOPY W/ POLYPECTOMY    . DENTAL SURGERY     implanted teeth x 2  . HERNIA REPAIR  1989  . LUMBAR LAMINECTOMY  1997  . LUMBAR PERCUTANEOUS PEDICLE SCREW 1 LEVEL  01/03/2013   Procedure: LUMBAR PERCUTANEOUS PEDICLE SCREWS LUMBAR THREE-FOUR;  Surgeon: [name], MD;  Location: [facility];  Service: Neurosurgery;;  . LUMBAR WOUND DEBRIDEMENT N/A 07/23/2014   Procedure: LUMBAR WOUND DEBRIDEMENT;  Surgeon: [name], MD;  Location: [facility];  Service: Neurosurgery;  Laterality: N/A;  . TONSILLECTOMY      Family History  Problem Relation Age of Onset  . Heart disease Mother     1, second hand  . Heart disease Father     28, former smoker    Medications- reviewed and updated Current Outpatient Prescriptions  Medication Sig Dispense Refill  . acetaminophen (TYLENOL) 500 MG tablet Take 500-1,000 mg by mouth every 6 (six) hours as needed (pain). Reported on 07/15/2015    . finasteride (PROSCAR) 5 MG tablet TAKE 1 TABLET (5 MG TOTAL) BY MOUTH DAILY. 90 tablet 3  . glipiZIDE (GLUCOTROL XL) 5 MG 24 hr tablet TAKE 1 TABLET (5 MG TOTAL) BY  MOUTH DAILY. 90 tablet 1  . glucose blood (ONETOUCH VERIO) test strip Use to test blood sugars daily. Dx: E11.9 100 each 12  . losartan (COZAAR) 100 MG tablet Take 1 tablet by mouth  daily 90 tablet 2  . meloxicam (MOBIC) 15 MG tablet Take 1 tablet by mouth  daily 90 tablet 3  . ONE TOUCH LANCETS MISC Use to test blood sugars. Dx:E11.9 100 each 11  . tamsulosin (FLOMAX) 0.4 MG CAPS capsule Take 1 capsule by mouth  daily 90 capsule 3  . TRAVATAN Z 0.004 % SOLN ophthalmic solution Place 1 drop into the left eye at bedtime.     . vitamin B-12 (CYANOCOBALAMIN) 1000 MCG tablet Take 1,000 mcg by mouth daily.     No current facility-administered medications for this visit.      Allergies-reviewed and updated No Known Allergies  Social History   Social History  . Marital status: Widowed    Spouse name: N/A  . Number of children: N/A  . Years of education: N/A   Social History Main Topics  . Smoking status: Former Smoker    Packs/day: 0.25    Years: 20.00    Types: Cigarettes    Quit date: 09/28/1995  . Smokeless tobacco: Never Used  . Alcohol use 0.6 oz/week    1 Glasses of wine per week     Comment: monthy  . Drug use: No  . Sexual activity: Yes   Other Topics Concern  . None   Social History Narrative   Remarried. 2017. Widowed 2009. 2 sons. 2 grandkids-boy/girl. No greatgrandkids.    Lives alone. Did everything for himself in past but back limiting him now      Retired from [employer]: golf stopped(hard with back), watch tv (pain with moving around)    Objective: BP 130/76 (BP Location: Left Arm, Patient Position: Sitting, Cuff Size: Large)   Pulse 81   Temp 98.1 F (36.7 C) (Oral)   Ht 5\' 10"  (1.778 m)   Wt 255 lb 6.4 oz (115.8 kg)   SpO2 92%   BMI 36.65 kg/m  Gen: NAD, resting comfortably HEENT: Mucous membranes are moist. Oropharynx normal Neck: no thyromegaly CV: RRR no murmurs rubs or gallops Lungs: CTAB no crackles, wheeze, rhonchi Abdomen: soft/nontender/nondistended/normal bowel sounds. No rebound or guarding.  Ext: 1+ edema Skin: warm, dry Neuro: grossly normal, moves all extremities, PERRLA, walks with walker Did not complete rectal this year   Diabetic Foot Exam - Simple   Simple Foot Form Diabetic Foot exam was performed with the following findings:  Yes 07/20/2016  9:02 AM  Visual Inspection No deformities, no ulcerations, no other skin breakdown bilaterally:  Yes Sensation Testing Intact to touch and monofilament testing bilaterally:  Yes Pulse Check Posterior Tibialis and Dorsalis pulse intact bilaterally:  Yes Comments    Assessment/Plan:  83 y.o. male presenting for annual physical.   Health Maintenance counseling: 1. Anticipatory guidance: Patient counseled regarding regular dental exams- needs to see them soon, eye exams every 6 months, wearing seatbelts.  2. Risk factor reduction:  Advised patient of need for regular exercise and diet rich and fruits and vegetables to reduce risk of heart attack and stroke. Exercise- going to [facility] twice a week. Diet-weight creeping up- discussed reversing trend.  Wt Readings from Last 3 Encounters:  07/20/16 255 lb 6.4 oz (115.8 kg)  06/09/16 253 lb 4.8 oz (114.9 kg)  01/18/16 249 lb 12.8 oz (113.3  kg)  3. Immunizations/screenings/ancillary studies Immunization History  Administered Date(s) Administered  . Influenza Split 02/10/2011, 01/11/2012  . Influenza Whole 03/15/2007, 01/31/2008, 02/04/2009, 02/01/2010  . Influenza, High Dose Seasonal PF 01/18/2016  . Influenza,inj,Quad PF,36+ Mos 02/12/2014, 01/13/2015  . Pneumococcal Conjugate-13 03/23/2015  . Pneumococcal Polysaccharide-23 03/24/2009, 02/12/2014  . Td 03/09/2007   never had chicken pox- no shingles vaccine 4. Prostate cancer screening- we opted out given stability of bph symptoms  Lab Results  Component Value Date   PSA 0.51 07/08/2015   PSA 0.66 08/05/2013   PSA 0.80 07/23/2012   5. Colon cancer screening - passed age based screening but wants to get stool cards  Status of chronic or acute concerns   CLL- followed by oncology stage 0 doing well  HLD- no rx for primary prevention- will update lipids. Mainly for encouragement for weight loss.   HTN controlled on half of a 100mg  losartan. Continue rx  Gout S: years ago patient diagnosed with gout but in retrospection patient states he thinks it may have been coming from his back. Denies ever having a hot or swollen joint in the big toe on the left- just an irritated feeling. He still gets that now when back is bothering him.  A/P: we opted to trial off the allopurinol. If he flares back up with potential gout will  see in office to evaluate further and treat   Osteoarthritis OA in shoulders- worse without meloxicam. No significant increase off his meloxicam- is on hydrocodone for back pain through Dr. Brien Few with neurosurgery  CKD (chronic kidney disease), stage III S:Stopped meloxicam last visit due to risks to kidneys. Last visit had bump up to 1.5 from 1.2-1.4 range previous.  A/P: reviewed CMP from oncology and kidney function improved slightly to baseline 6 weeks ago- will not repeat today. Stay off mobic  Diabetes mellitus type II, controlled S: has been controlled. On glipizide 5mg  24 hr. No low blood sugars Lab Results  Component Value Date   HGBA1C 6.0 01/18/2016   HGBA1C 5.6 07/08/2015   HGBA1C 5.6 01/13/2015   A/P: update a1c today   BPH (benign prostatic hyperplasia) known BPH- has been tolerating finasteride. Nocturia oncea  Night stable down from twice a night. We opted to hold off on psa given age and improvement in symptoms. On flomax as well. Some tenderness at times in chest and touches area and goes away- no nodules. ? If increase in size of breast tissue.    No Follow-up on file.  Orders Placed This Encounter  Procedures  . Hemoglobin A1c    [place]  . Lipid panel    [place]    Order Specific Question:   Has the patient fasted?    Answer:   No  . POC Hemoccult Bld/Stl (3-Cd Home Screen)    Send home    Standing Status:   Future    Standing Expiration Date:   07/20/2017    No orders of the defined types were placed in this encounter.   Return precautions advised.   Garret Reddish, MD

## 2016-07-20 NOTE — Assessment & Plan Note (Signed)
S: has been controlled. On glipizide 5mg  24 hr. No low blood sugars Lab Results  Component Value Date   HGBA1C 6.0 01/18/2016   HGBA1C 5.6 07/08/2015   HGBA1C 5.6 01/13/2015   A/P: update a1c today

## 2016-07-20 NOTE — Assessment & Plan Note (Signed)
S:Stopped meloxicam last visit due to risks to kidneys. Last visit had bump up to 1.5 from 1.2-1.4 range previous.  A/P: reviewed CMP from oncology and kidney function improved slightly to baseline 6 weeks ago- will not repeat today. Stay off mobic

## 2016-07-20 NOTE — Assessment & Plan Note (Signed)
S: years ago patient diagnosed with gout but in retrospection patient states he thinks it may have been coming from his back. Denies ever having a hot or swollen joint in the big toe on the left- just an irritated feeling. He still gets that now when back is bothering him.  A/P: we opted to trial off the allopurinol. If he flares back up with potential gout will see in office to evaluate further and treat

## 2016-07-20 NOTE — Assessment & Plan Note (Addendum)
known BPH- has been tolerating finasteride. Nocturia oncea  Night stable down from twice a night. We opted to hold off on psa given age and improvement in symptoms. On flomax as well. Some tenderness at times in chest and touches area and goes away- no nodules. ? If increase in size of breast tissue.

## 2016-08-17 ENCOUNTER — Other Ambulatory Visit: Payer: Self-pay | Admitting: Family Medicine

## 2016-09-22 DIAGNOSIS — M461 Sacroiliitis, not elsewhere classified: Secondary | ICD-10-CM | POA: Diagnosis not present

## 2016-09-22 DIAGNOSIS — M48061 Spinal stenosis, lumbar region without neurogenic claudication: Secondary | ICD-10-CM | POA: Diagnosis not present

## 2016-09-22 DIAGNOSIS — M961 Postlaminectomy syndrome, not elsewhere classified: Secondary | ICD-10-CM | POA: Diagnosis not present

## 2016-09-22 DIAGNOSIS — Z79899 Other long term (current) drug therapy: Secondary | ICD-10-CM | POA: Diagnosis not present

## 2016-10-03 DIAGNOSIS — R29898 Other symptoms and signs involving the musculoskeletal system: Secondary | ICD-10-CM | POA: Diagnosis not present

## 2016-10-03 DIAGNOSIS — M5416 Radiculopathy, lumbar region: Secondary | ICD-10-CM | POA: Diagnosis not present

## 2016-10-03 DIAGNOSIS — M48061 Spinal stenosis, lumbar region without neurogenic claudication: Secondary | ICD-10-CM | POA: Diagnosis not present

## 2016-10-03 DIAGNOSIS — M21372 Foot drop, left foot: Secondary | ICD-10-CM | POA: Diagnosis not present

## 2016-10-05 DIAGNOSIS — M47814 Spondylosis without myelopathy or radiculopathy, thoracic region: Secondary | ICD-10-CM | POA: Diagnosis not present

## 2016-10-13 DIAGNOSIS — M21372 Foot drop, left foot: Secondary | ICD-10-CM | POA: Diagnosis not present

## 2016-10-13 NOTE — Progress Notes (Signed)
Subjective:   Kevin Rogers is a 83 y.o. male who presents for an Initial Medicare Annual Wellness Visit.  Pt states that for the past few weeks he has noticed swelling to left or right leg. Pt states that it comes and goes. Pt denies any shortness of breath, shortness of breath when laying down or cough. I instructed pt to call office if he notices any worsening or any new symptoms. On exam I noticed bilateral leg swelling, non-pitting. Mostly over ankle area. I suggested he try to elevate his legs when sitting.  The Patient was informed that the wellness visit is to identify future health risk and educate and initiate measures that can reduce risk for increased disease through the lifespan.   Review of Systems  No ROS.  Medicare Wellness Visit. Additional risk factors are reflected in the social history.  Cardiac Risk Factors include: advanced age (>69men, >65 women);diabetes mellitus;dyslipidemia;hypertension;obesity (BMI >30kg/m2)  Sleep patterns: 8 hrs night. Wakes up feeling rested. Gets up 1-2 times at night to use restroom. Home Safety/Smoke Alarms: Feels safe in home. Smoke alarms in place.  Living environment; residence and Firearm Safety: Lives with wife in two story home. Lives on one story. Seat Belt Safety/Bike Helmet: Wears seat belt.   Counseling:   Eye Exam- Dr Claudean Kinds, every 6 months. Dental- Over 2 years. Pt states he will call and schedule dental exam.  Male:   CCS- Aged out. Pt states he will bring the stool card to the office.  PSA-  Lab Results  Component Value Date   PSA 0.51 07/08/2015   PSA 0.66 08/05/2013   PSA 0.80 07/23/2012      Objective:    Today's Vitals   10/18/16 1457  BP: 110/60  Pulse: 61  Resp: 16  SpO2: 96%  Weight: 249 lb 4.8 oz (113.1 kg)  Height: 5\' 10"  (1.778 m)   Body mass index is 35.77 kg/m.  Current Medications (verified) Outpatient Encounter Prescriptions as of 10/18/2016  Medication Sig  . acetaminophen (TYLENOL)  500 MG tablet Take 500-1,000 mg by mouth every 6 (six) hours as needed (pain). Reported on 07/15/2015  . finasteride (PROSCAR) 5 MG tablet TAKE 1 TABLET (5 MG TOTAL) BY MOUTH DAILY.  [name] glipiZIDE (GLUCOTROL XL) 5 MG 24 hr tablet TAKE 1 TABLET (5 MG TOTAL) BY MOUTH DAILY.  [name] glucose blood (ONETOUCH VERIO) test strip Use to test blood sugars daily. Dx: E11.9  . losartan (COZAAR) 100 MG tablet Take 1 tablet by mouth  daily  . ONE TOUCH LANCETS MISC Use to test blood sugars. Dx:E11.9  . tamsulosin (FLOMAX) 0.4 MG CAPS capsule Take 1 capsule by mouth  daily  . TRAVATAN Z 0.004 % SOLN ophthalmic solution Place 1 drop into the left eye at bedtime.   . vitamin B-12 (CYANOCOBALAMIN) 1000 MCG tablet Take 1,000 mcg by mouth daily.  . [DISCONTINUED] meloxicam (MOBIC) 15 MG tablet Take 1 tablet by mouth  daily (Patient not taking: Reported on 10/18/2016)   No facility-administered encounter medications on file as of 10/18/2016.     Allergies (verified) Patient has no known allergies.   History: Past Medical History:  Diagnosis Date  . Acute bronchitis 09/12/2007   Qualifier: Diagnosis of  By: [name] MD, [name]   . Diabetes mellitus type 2 with complications ([place]) 2297  . Glaucoma    left eye  . Gout   . Hyperlipidemia   . Hypertension   . Leukemia, chronic ([place]) 2001   does  not take treatment just has blood levels checked once a year   Past Surgical History:  Procedure Laterality Date  . ANTERIOR LAT LUMBAR FUSION Left 01/03/2013   Procedure: ANTERIOR LATERAL LUMBAR FUSION lumbar three-four;  Surgeon: [name], MD;  Location: [facility];  Service: Neurosurgery;  Laterality: Left;  . BACK SURGERY     lumbar fusion - 2016, feb  . COLONOSCOPY W/ POLYPECTOMY    . DENTAL SURGERY     implanted teeth x 2  . HERNIA REPAIR  1989  . LUMBAR LAMINECTOMY  1997  . LUMBAR PERCUTANEOUS PEDICLE SCREW 1 LEVEL  01/03/2013   Procedure: LUMBAR PERCUTANEOUS PEDICLE SCREWS LUMBAR THREE-FOUR;  Surgeon: [name], MD;  Location: [facility];  Service: Neurosurgery;;  . LUMBAR WOUND DEBRIDEMENT N/A 07/23/2014   Procedure: LUMBAR WOUND DEBRIDEMENT;  Surgeon: [name], MD;  Location: [facility];  Service: Neurosurgery;  Laterality: N/A;  . TONSILLECTOMY     Family History  Problem Relation Age of Onset  . Heart disease Mother        12, second hand  . Heart disease Father        27, former smoker   Social History   Occupational History  . Not on file.   Social History Main Topics  . Smoking status: Former Smoker    Packs/day: 0.25    Years: 20.00    Types: Cigarettes    Quit date: 09/28/1995  . Smokeless tobacco: Never Used  . Alcohol use 0.6 oz/week    1 Glasses of wine per week     Comment: monthy  . Drug use: No  . Sexual activity: Yes   Tobacco Counseling Counseling given: Not Answered   Activities of Daily Living In your present state of health, do you have any difficulty performing the following activities: 10/18/2016  Hearing? N  Vision? N  Difficulty concentrating or making decisions? N  Walking or climbing stairs? Y  Dressing or bathing? N  Doing errands, shopping? N  Preparing Food and eating ? N  Using the Toilet? N  In the past six months, have you accidently leaked urine? N  Do you have problems with loss of bowel control? N  Managing your Medications? N  Managing your Finances? N  Housekeeping or managing your Housekeeping? N  Some recent data might be hidden    Immunizations and Health Maintenance Immunization History  Administered Date(s) Administered  . Influenza Split 02/10/2011, 01/11/2012  . Influenza Whole 03/15/2007, 01/31/2008, 02/04/2009, 02/01/2010  . Influenza, High Dose Seasonal PF 01/18/2016  . Influenza,inj,Quad PF,36+ Mos 02/12/2014, 01/13/2015  . Pneumococcal Conjugate-13 03/23/2015  . Pneumococcal Polysaccharide-23 03/24/2009, 02/12/2014  . Td 03/09/2007   There are no preventive care reminders to display for this  patient.  Patient Care Team: [name], MD as PCP - General (Family Medicine) [name], MD as Consulting Physician (Neurosurgery)  Indicate any recent Medical Services you may have received from other than [facility] providers in the past year (date may be approximate).    Assessment:   This is a routine wellness examination for [place]. Physical assessment deferred to PCP.  Hearing/Vision screen Hearing Screening Comments: Able to hear conversational tones w/o difficulty. No issues reported.   Vision Screening Comments: Dr Claudean Kinds, every 6 months.  Dietary issues and exercise activities discussed: Current Exercise Habits: The patient does not participate in regular exercise at present, Exercise limited by: orthopedic condition(s)  Diet (meal preparation, eat out, water intake,  caffeinated beverages, dairy products, fruits and vegetables): Eats 2.5 meals/day. Drinks green tea. Uses splenda to sweeten at times. Drinks 4-5 glasses/day. Meals cooked at home. Breakfast: Sausage or bacon with egg or cereal. Lunch: Sandwich. Dinner:   Meat (mostly chicken or pork chops) with vegetables and a starch.  Snacks: Nuts, potato chips. .   Goals    . Gain strength on the back    . Maintain current health status      Depression Screen PHQ 2/9 Scores 10/18/2016 07/20/2016 05/05/2015 03/31/2015  PHQ - 2 Score 0 0 0 0    Fall Risk Fall Risk  10/18/2016 07/20/2016 07/06/2015 05/05/2015 03/31/2015  Falls in the past year? No Yes No No No  Number falls in past yr: - 1 - - -  Injury with Fall? - Yes - - -  Risk for fall due to : Impaired balance/gait;Impaired mobility Impaired balance/gait - - -  Risk for fall due to (comments): - - - - -    Cognitive Function:   Ad8 score reviewed for issues:  Issues making decisions:no  Less interest in hobbies / activities:no  Repeats questions, stories (family complaining):  Trouble using ordinary gadgets (microwave, computer, phone):no  Forgets  the month or year:no  Mismanaging finances: no  Remembering appts:no  Daily problems with thinking and/or memory:no Ad8 score is=0     Screening Tests Health Maintenance  Topic Date Due  . INFLUENZA VACCINE  11/30/2016  . OPHTHALMOLOGY EXAM  01/18/2017  . HEMOGLOBIN A1C  01/20/2017  . TETANUS/TDAP  03/08/2017  . FOOT EXAM  07/20/2017  . PNA vac Low Risk Adult  Completed      Plan:    Follow-up w/ PCP as scheduled. Pt will bring back stool card that was given to him in March.  I have personally reviewed and noted the following in the patient's chart:   . Medical and social history . Use of alcohol, tobacco or illicit drugs  . Current medications and supplements . Functional ability and status . Nutritional status . Physical activity . Advanced directives . List of other physicians . Vitals . Screenings to include cognitive, depression, and falls . Referrals and appointments  In addition, I have reviewed and discussed with patient certain preventive protocols, quality metrics, and best practice recommendations. A written personalized care plan for preventive services as well as general preventive health recommendations were provided to patient.     Ree Edman, RN   10/18/2016

## 2016-10-18 ENCOUNTER — Ambulatory Visit (INDEPENDENT_AMBULATORY_CARE_PROVIDER_SITE_OTHER): Payer: Medicare Other

## 2016-10-18 VITALS — BP 110/60 | HR 61 | Resp 16 | Ht 70.0 in | Wt 249.3 lb

## 2016-10-18 DIAGNOSIS — Z0000 Encounter for general adult medical examination without abnormal findings: Secondary | ICD-10-CM | POA: Diagnosis not present

## 2016-10-18 NOTE — Patient Instructions (Addendum)
Schedule appointment with dentist.  Bring in stool card. Bring a copy of your advance directives to your next office visit.  Back Exercises If you have pain in your back, do these exercises 2-3 times each day or as told by your doctor. When the pain goes away, do the exercises once each day, but repeat the steps more times for each exercise (do more repetitions). If you do not have pain in your back, do these exercises once each day or as told by your doctor. Exercises Single Knee to Chest  Do these steps 3-5 times in a row for each leg: 1. Lie on your back on a firm bed or the floor with your legs stretched out. 2. Bring one knee to your chest. 3. Hold your knee to your chest by grabbing your knee or thigh. 4. Pull on your knee until you feel a gentle stretch in your lower back. 5. Keep doing the stretch for 10-30 seconds. 6. Slowly let go of your leg and straighten it.  Pelvic Tilt  Do these steps 5-10 times in a row: 1. Lie on your back on a firm bed or the floor with your legs stretched out. 2. Bend your knees so they point up to the ceiling. Your feet should be flat on the floor. 3. Tighten your lower belly (abdomen) muscles to press your lower back against the floor. This will make your tailbone point up to the ceiling instead of pointing down to your feet or the floor. 4. Stay in this position for 5-10 seconds while you gently tighten your muscles and breathe evenly.  Cat-Cow  Do these steps until your lower back bends more easily: 1. Get on your hands and knees on a firm surface. Keep your hands under your shoulders, and keep your knees under your hips. You may put padding under your knees. 2. Let your head hang down, and make your tailbone point down to the floor so your lower back is round like the back of a cat. 3. Stay in this position for 5 seconds. 4. Slowly lift your head and make your tailbone point up to the ceiling so your back hangs low (sags) like the back of a  cow. 5. Stay in this position for 5 seconds.  Press-Ups  Do these steps 5-10 times in a row: 1. Lie on your belly (face-down) on the floor. 2. Place your hands near your head, about shoulder-width apart. 3. While you keep your back relaxed and keep your hips on the floor, slowly straighten your arms to raise the top half of your body and lift your shoulders. Do not use your back muscles. To make yourself more comfortable, you may change where you place your hands. 4. Stay in this position for 5 seconds. 5. Slowly return to lying flat on the floor.  Bridges  Do these steps 10 times in a row: 1. Lie on your back on a firm surface. 2. Bend your knees so they point up to the ceiling. Your feet should be flat on the floor. 3. Tighten your butt muscles and lift your butt off of the floor until your waist is almost as high as your knees. If you do not feel the muscles working in your butt and the back of your thighs, slide your feet 1-2 inches farther away from your butt. 4. Stay in this position for 3-5 seconds. 5. Slowly lower your butt to the floor, and let your butt muscles relax.  If this exercise  is too easy, try doing it with your arms crossed over your chest. Belly Crunches  Do these steps 5-10 times in a row: 1. Lie on your back on a firm bed or the floor with your legs stretched out. 2. Bend your knees so they point up to the ceiling. Your feet should be flat on the floor. 3. Cross your arms over your chest. 4. Tip your chin a little bit toward your chest but do not bend your neck. 5. Tighten your belly muscles and slowly raise your chest just enough to lift your shoulder blades a tiny bit off of the floor. 6. Slowly lower your chest and your head to the floor.  Back Lifts Do these steps 5-10 times in a row: 1. Lie on your belly (face-down) with your arms at your sides, and rest your forehead on the floor. 2. Tighten the muscles in your legs and your butt. 3. Slowly lift your  chest off of the floor while you keep your hips on the floor. Keep the back of your head in line with the curve in your back. Look at the floor while you do this. 4. Stay in this position for 3-5 seconds. 5. Slowly lower your chest and your face to the floor.  Contact a doctor if:  Your back pain gets a lot worse when you do an exercise.  Your back pain does not lessen 2 hours after you exercise. If you have any of these problems, stop doing the exercises. Do not do them again unless your doctor says it is okay. Get help right away if:  You have sudden, very bad back pain. If this happens, stop doing the exercises. Do not do them again unless your doctor says it is okay. This information is not intended to replace advice given to you by your health care provider. Make sure you discuss any questions you have with your health care provider. Document Released: 05/21/2010 Document Revised: 09/24/2015 Document Reviewed: 06/12/2014 Elsevier Interactive Patient Education  2018 [place] 81 Years and Older, Male Preventive care refers to lifestyle choices and visits with your health care provider that can promote health and wellness. What does preventive care include?  A yearly physical exam. This is also called an annual well check.  Dental exams once or twice a year.  Routine eye exams. Ask your health care provider how often you should have your eyes checked.  Personal lifestyle choices, including: ? Daily care of your teeth and gums. ? Regular physical activity. ? Eating a healthy diet. ? Avoiding tobacco and drug use. ? Limiting alcohol use. ? Practicing safe sex. ? Taking low doses of aspirin every day. ? Taking vitamin and mineral supplements as recommended by your health care provider. What happens during an annual well check? The services and screenings done by your health care provider during your annual well check will depend on your age, overall health,  lifestyle risk factors, and family history of disease. Counseling Your health care provider may ask you questions about your:  Alcohol use.  Tobacco use.  Drug use.  Emotional well-being.  Home and relationship well-being.  Sexual activity.  Eating habits.  History of falls.  Memory and ability to understand (cognition).  Work and work Statistician.  Screening You may have the following tests or measurements:  Height, weight, and BMI.  Blood pressure.  Lipid and cholesterol levels. These may be checked every 5 years, or more frequently if you are over  66 years old.  Skin check.  Lung cancer screening. You may have this screening every year starting at age 66 if you have a 30-pack-year history of smoking and currently smoke or have quit within the past 15 years.  Fecal occult blood test (FOBT) of the stool. You may have this test every year starting at age 63.  Flexible sigmoidoscopy or colonoscopy. You may have a sigmoidoscopy every 5 years or a colonoscopy every 10 years starting at age 58.  Prostate cancer screening. Recommendations will vary depending on your family history and other risks.  Hepatitis C blood test.  Hepatitis B blood test.  Sexually transmitted disease (STD) testing.  Diabetes screening. This is done by checking your blood sugar (glucose) after you have not eaten for a while (fasting). You may have this done every 1-3 years.  Abdominal aortic aneurysm (AAA) screening. You may need this if you are a current or former smoker.  Osteoporosis. You may be screened starting at age 62 if you are at high risk.  Talk with your health care provider about your test results, treatment options, and if necessary, the need for more tests. Vaccines Your health care provider may recommend certain vaccines, such as:  Influenza vaccine. This is recommended every year.  Tetanus, diphtheria, and acellular pertussis (Tdap, Td) vaccine. You may need a Td  booster every 10 years.  Varicella vaccine. You may need this if you have not been vaccinated.  Zoster vaccine. You may need this after age 68.  Measles, mumps, and rubella (MMR) vaccine. You may need at least one dose of MMR if you were born in 1957 or later. You may also need a second dose.  Pneumococcal 13-valent conjugate (PCV13) vaccine. One dose is recommended after age 53.  Pneumococcal polysaccharide (PPSV23) vaccine. One dose is recommended after age 62.  Meningococcal vaccine. You may need this if you have certain conditions.  Hepatitis A vaccine. You may need this if you have certain conditions or if you travel or work in places where you may be exposed to hepatitis A.  Hepatitis B vaccine. You may need this if you have certain conditions or if you travel or work in places where you may be exposed to hepatitis B.  Haemophilus influenzae type b (Hib) vaccine. You may need this if you have certain risk factors.  Talk to your health care provider about which screenings and vaccines you need and how often you need them. This information is not intended to replace advice given to you by your health care provider. Make sure you discuss any questions you have with your health care provider. Document Released: 05/15/2015 Document Revised: 01/06/2016 Document Reviewed: 02/17/2015 Elsevier Interactive Patient Education  2017 [employer].

## 2016-10-18 NOTE — Progress Notes (Signed)
I have reviewed and agree with note, evaluation, plan.   Stephen Hunter, MD  

## 2016-10-27 DIAGNOSIS — H1132 Conjunctival hemorrhage, left eye: Secondary | ICD-10-CM | POA: Diagnosis not present

## 2016-11-21 ENCOUNTER — Other Ambulatory Visit: Payer: Self-pay

## 2016-11-21 MED ORDER — TAMSULOSIN HCL 0.4 MG PO CAPS: 0.4000 mg | ORAL_CAPSULE | Freq: Every day | ORAL | 3 refills | Status: DC

## 2016-12-14 DIAGNOSIS — H2513 Age-related nuclear cataract, bilateral: Secondary | ICD-10-CM | POA: Diagnosis not present

## 2016-12-14 DIAGNOSIS — H401131 Primary open-angle glaucoma, bilateral, mild stage: Secondary | ICD-10-CM | POA: Diagnosis not present

## 2016-12-20 DIAGNOSIS — R29898 Other symptoms and signs involving the musculoskeletal system: Secondary | ICD-10-CM | POA: Diagnosis not present

## 2016-12-20 DIAGNOSIS — M47814 Spondylosis without myelopathy or radiculopathy, thoracic region: Secondary | ICD-10-CM | POA: Diagnosis not present

## 2016-12-20 DIAGNOSIS — M48061 Spinal stenosis, lumbar region without neurogenic claudication: Secondary | ICD-10-CM | POA: Diagnosis not present

## 2016-12-20 DIAGNOSIS — M5416 Radiculopathy, lumbar region: Secondary | ICD-10-CM | POA: Diagnosis not present

## 2016-12-20 DIAGNOSIS — M21372 Foot drop, left foot: Secondary | ICD-10-CM | POA: Diagnosis not present

## 2017-01-12 ENCOUNTER — Other Ambulatory Visit (INDEPENDENT_AMBULATORY_CARE_PROVIDER_SITE_OTHER): Payer: Medicare Other

## 2017-01-12 DIAGNOSIS — Z1211 Encounter for screening for malignant neoplasm of colon: Secondary | ICD-10-CM

## 2017-01-12 LAB — POC HEMOCCULT BLD/STL (HOME/3-CARD/SCREEN)
Card #2 Fecal Occult Blod, POC: NEGATIVE
Card #3 Fecal Occult Blood, POC: NEGATIVE
Fecal Occult Blood, POC: NEGATIVE

## 2017-01-24 ENCOUNTER — Ambulatory Visit (INDEPENDENT_AMBULATORY_CARE_PROVIDER_SITE_OTHER): Payer: Medicare Other | Admitting: Family Medicine

## 2017-01-24 ENCOUNTER — Encounter: Payer: Self-pay | Admitting: Family Medicine

## 2017-01-24 VITALS — BP 122/82 | HR 64 | Temp 98.4°F | Ht 70.0 in | Wt 248.2 lb

## 2017-01-24 DIAGNOSIS — N401 Enlarged prostate with lower urinary tract symptoms: Secondary | ICD-10-CM

## 2017-01-24 DIAGNOSIS — N182 Chronic kidney disease, stage 2 (mild): Secondary | ICD-10-CM

## 2017-01-24 DIAGNOSIS — M1009 Idiopathic gout, multiple sites: Secondary | ICD-10-CM

## 2017-01-24 DIAGNOSIS — N183 Chronic kidney disease, stage 3 unspecified: Secondary | ICD-10-CM

## 2017-01-24 DIAGNOSIS — R351 Nocturia: Secondary | ICD-10-CM

## 2017-01-24 DIAGNOSIS — E1122 Type 2 diabetes mellitus with diabetic chronic kidney disease: Secondary | ICD-10-CM | POA: Diagnosis not present

## 2017-01-24 DIAGNOSIS — I10 Essential (primary) hypertension: Secondary | ICD-10-CM

## 2017-01-24 DIAGNOSIS — Z23 Encounter for immunization: Secondary | ICD-10-CM

## 2017-01-24 DIAGNOSIS — M48061 Spinal stenosis, lumbar region without neurogenic claudication: Secondary | ICD-10-CM

## 2017-01-24 LAB — COMPREHENSIVE METABOLIC PANEL
ALT: 11 U/L (ref 0–53)
AST: 12 U/L (ref 0–37)
Albumin: 4.2 g/dL (ref 3.5–5.2)
Alkaline Phosphatase: 71 U/L (ref 39–117)
BUN: 21 mg/dL (ref 6–23)
CO2: 31 mEq/L (ref 19–32)
Calcium: 9.9 mg/dL (ref 8.4–10.5)
Chloride: 105 mEq/L (ref 96–112)
Creatinine, Ser: 1.38 mg/dL (ref 0.40–1.50)
GFR: 63.3 mL/min (ref >60.00)
Glucose, Bld: 126 mg/dL — ABNORMAL HIGH (ref 70–99)
Potassium: 4.7 mEq/L (ref 3.5–5.1)
Sodium: 143 mEq/L (ref 135–145)
Total Bilirubin: 0.9 mg/dL (ref 0.2–1.2)
Total Protein: 6.5 g/dL (ref 6.0–8.3)

## 2017-01-24 LAB — CBC WITH DIFFERENTIAL/PLATELET
Basophils Absolute: 0 10*3/uL (ref 0.0–0.1)
Basophils Relative: 0.1 % (ref 0.0–3.0)
Eosinophils Absolute: 0.1 10*3/uL (ref 0.0–0.7)
Eosinophils Relative: 0.4 % (ref 0.0–5.0)
HCT: 41.2 % (ref 39.0–52.0)
Hemoglobin: 13.2 g/dL (ref 13.0–17.0)
Lymphocytes Relative: 74.7 % — ABNORMAL HIGH (ref 12.0–46.0)
Lymphs Abs: 8.9 10*3/uL — ABNORMAL HIGH (ref 0.7–4.0)
MCHC: 32 g/dL (ref 30.0–36.0)
MCV: 92.3 fl (ref 78.0–100.0)
Monocytes Absolute: 0.2 10*3/uL (ref 0.1–1.0)
Monocytes Relative: 1.7 % — ABNORMAL LOW (ref 3.0–12.0)
Neutro Abs: 2.8 10*3/uL (ref 1.4–7.7)
Neutrophils Relative %: 23.1 % — ABNORMAL LOW (ref 43.0–77.0)
Platelets: 169 10*3/uL (ref 150.0–400.0)
RBC: 4.47 Mil/uL (ref 4.22–5.81)
RDW: 13.8 % (ref 11.5–15.5)
WBC: 12 10*3/uL — ABNORMAL HIGH (ref 4.0–10.5)

## 2017-01-24 LAB — HEMOGLOBIN A1C: Hgb A1c MFr Bld: 6 % (ref 4.6–6.5)

## 2017-01-24 IMAGING — CT CT L SPINE W/O CM
3 series · 13 of 33 positions shown, 16 images · non-contrast
Comparison: CT myelogram 05/13/2014

CLINICAL DATA: Severe back pain. Fever and sepsis of unclear
etiology.

EXAM:
CT LUMBAR SPINE WITHOUT CONTRAST
TECHNIQUE: Multidetector CT imaging of the lumbar spine was performed without
intravenous contrast administration. Multiplanar CT image
reconstructions were also generated.

[Series 3: l spine 2.0 i30s 3 · axial · 0.33mm/px · z∈[-235,-85]mm · 5 of 109 slices shown, 7 images]
[im 17/109  soft-tissue]
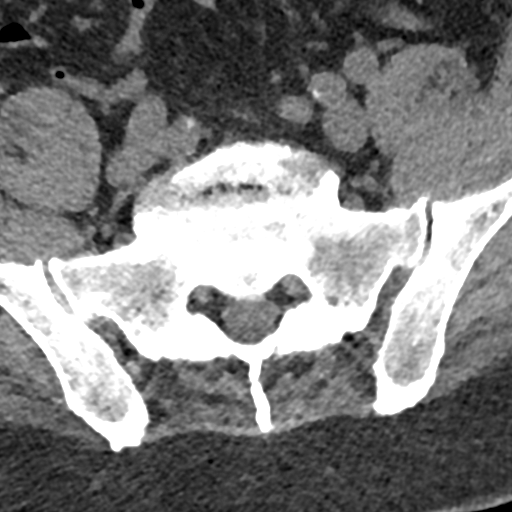
[im 17/109  bone]
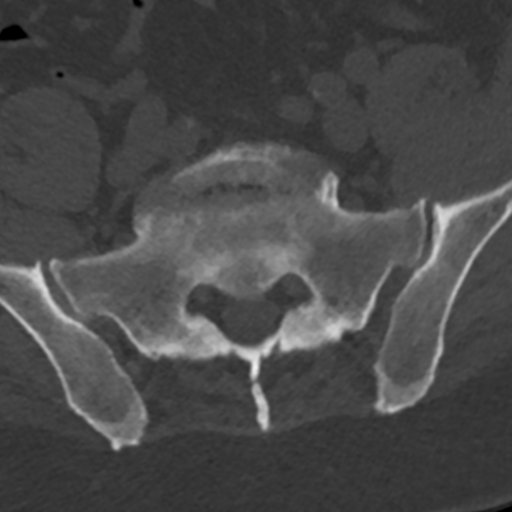
[im 34/109  bone]
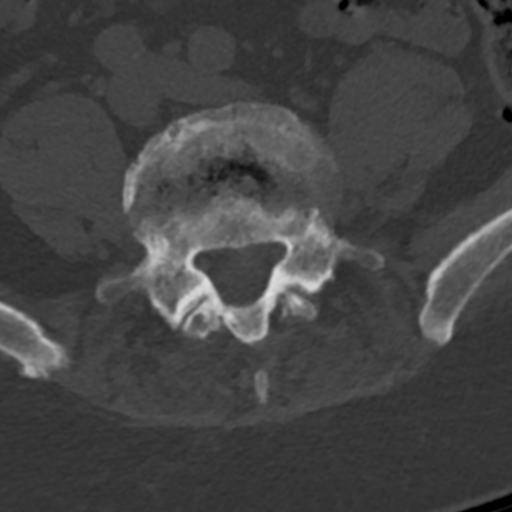
[im 59/109  bone]
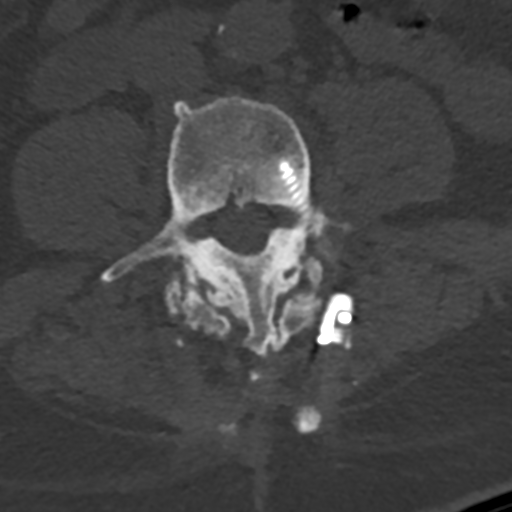
[im 75/109  bone]
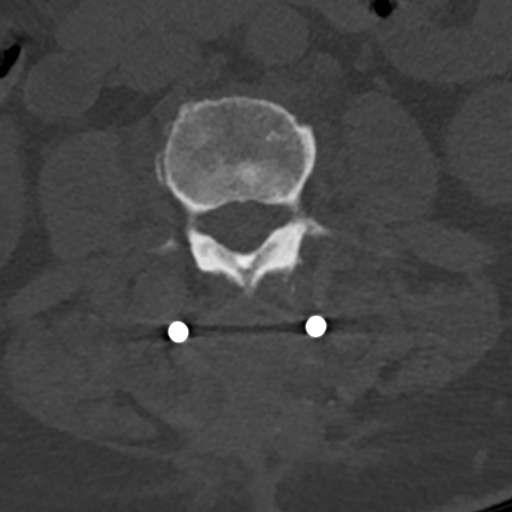
[im 92/109  soft-tissue]
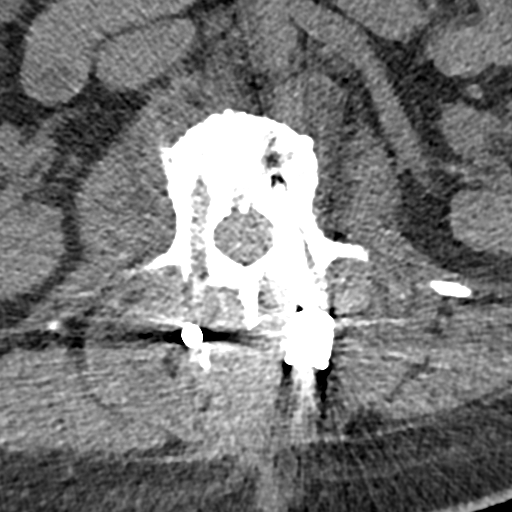
[im 92/109  bone]
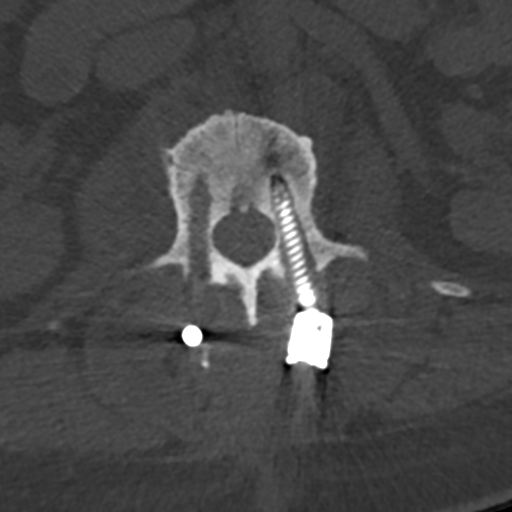

[Series 6: coronal st · coronal · 0.32mm/px · 3 of 70 slices shown]
[im 14/70  bone]
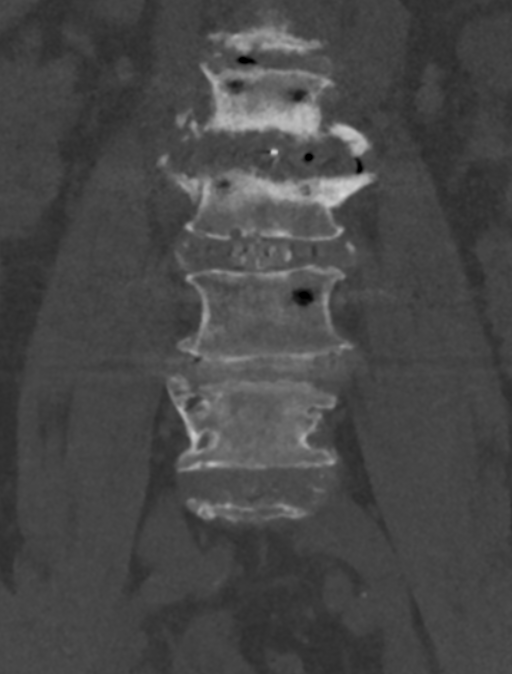
[im 28/70  bone]
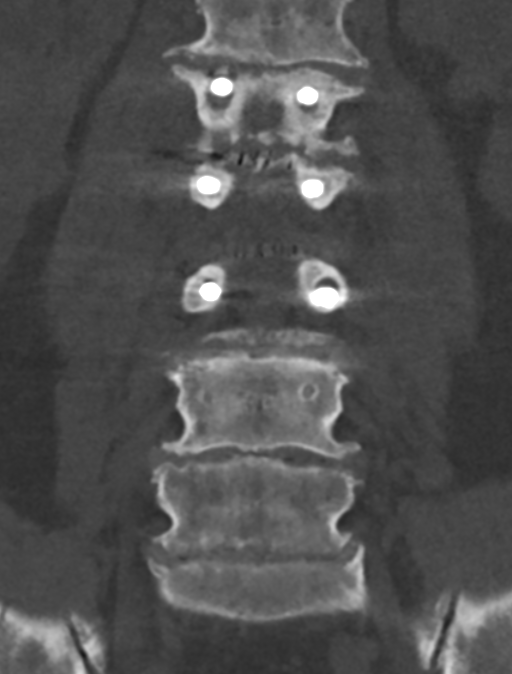
[im 42/70  bone]
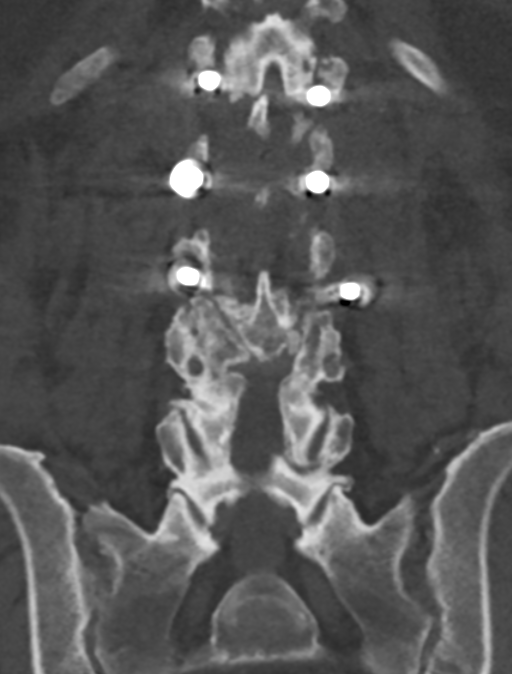

[Series 7: sagittal st · sagittal · 0.35mm/px · 5 of 63 slices shown, 6 images]
[im 21/63  bone]
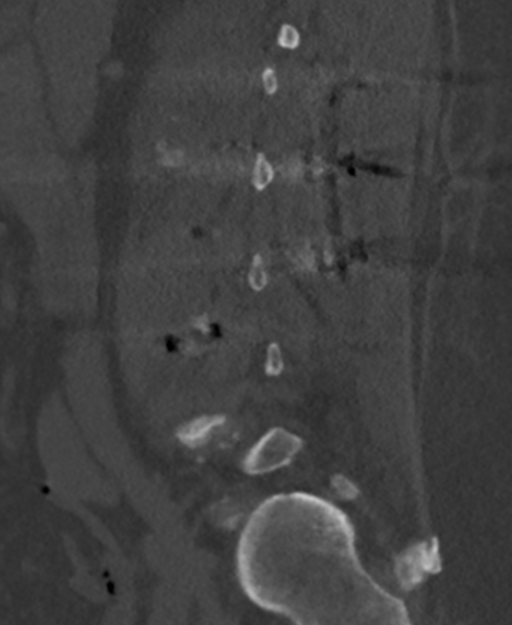
[im 26/63  bone]
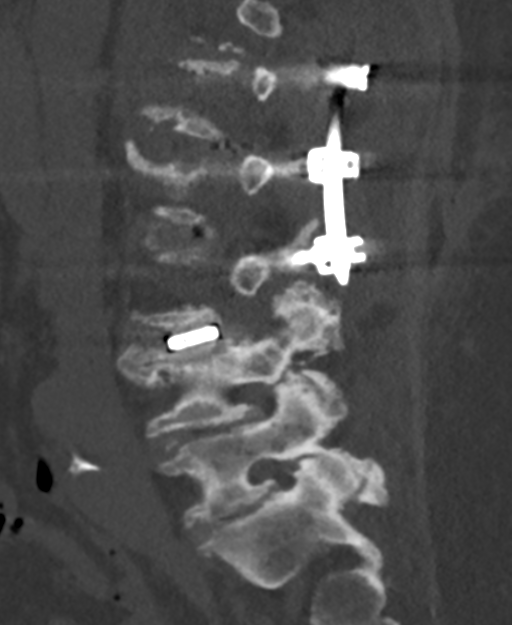
[im 32/63  soft-tissue]
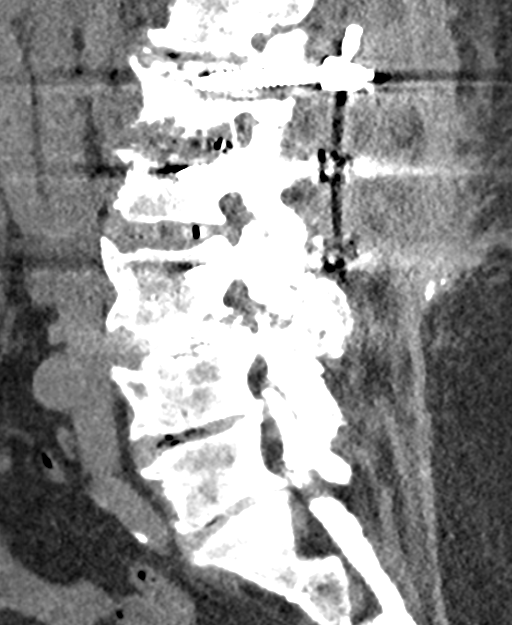
[im 32/63  bone]
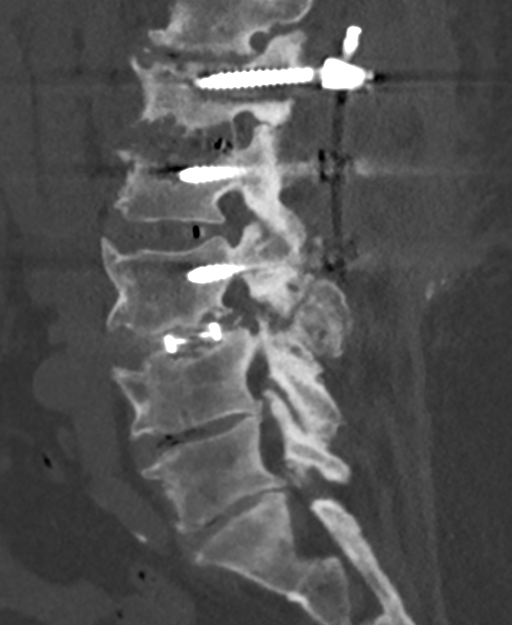
[im 37/63  bone]
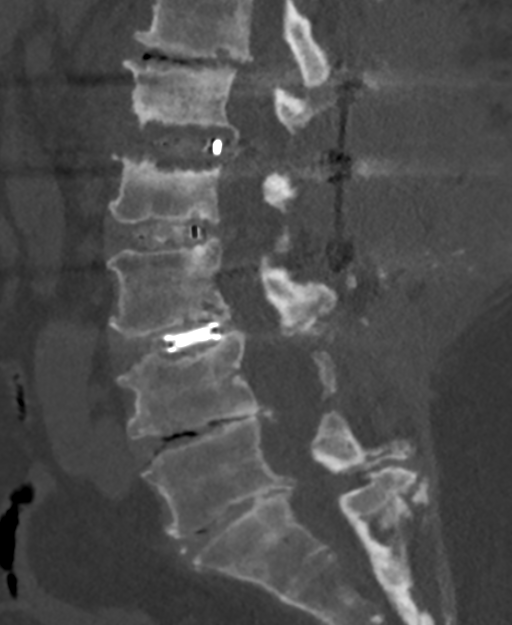
[im 42/63  bone]
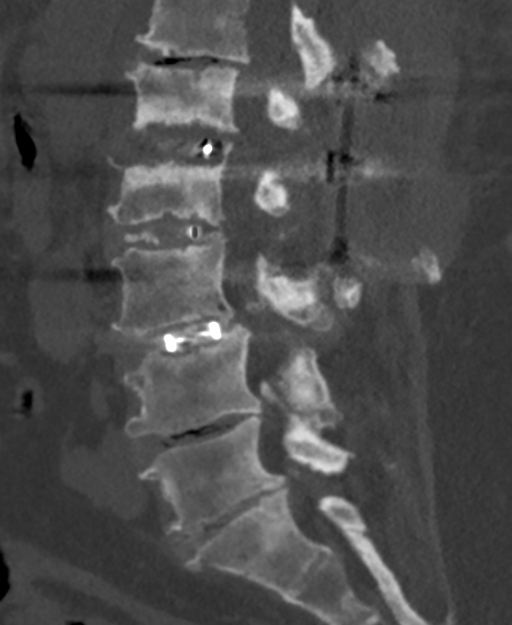

[13 of 33 positions shown; findings below may reference images not displayed]

FINDINGS: Spinal numbering as previously established.

L1-2 and L2-3 posterior lumbar fusion with rod and pedicle screw
fixation. Pre-existing L3-4 PLIF with associated posterior fusion
hardware removed since the comparison study.

Wide discectomy space at L1-2 with L2 superior endplate fracture
(most convincing on coronal reformats) and excessive subsidence that
appears chronic, but has occurred since 09/09/2014 radiography.
There has also been progressive subsidence into the L1 inferior
endplate. The associated endplates are eroded, with underlying
sclerosis, even greater than expected based on extensive
preoperative subchondral irregularity. Wide lucency around the L1
and L3 pedicle screws, consistent with loosening. No graft
displacement.

No L1 superior endplate fracture as suspected on prior radiography.
T12-L1 disc narrowing and subchondral erosive changes similar to
preoperative myelogram.

Perispinal edematous change, best visualized at the level of T12-L1,
but likely also at the L1-L2 level - increasing concern for
postoperative infection.

No gross canal or incisional fluid collection.

No progressive degenerative stenosis compared to CT myelogram
comparison.
IMPRESSION: 1. Progressive widening of the L1-2 discectomy site with chronic
appearing L2 superior endplate fracture and excessive subsidence.
Appearance and history concerning for postoperative discitis, MRI
with contrast recommended.
2. Rapid loosening of L1 and L3 pedicle screws.
3. No L1 superior endplate fracture as questioned on previous
radiography.
4. No acute finding at the L2-3 and L3-4 discectomy sites. No solid
bony fusion.

## 2017-01-24 NOTE — Assessment & Plan Note (Signed)
S: CKD III- Cr in past 1.2-1.4 and had bumped to 1.5 before going back to baseline A/P: update bmet/cmet . Continue off mobic

## 2017-01-24 NOTE — Assessment & Plan Note (Signed)
S: chronic back pain and OA in shoulders- high level without meloxicam but off med due to CKD III- checking bmp every 6 months. On hydrocodone or oxycodone for back pain through Dr. Brien Few neurosurgery- takes it BID.  A/P: continue without meloxicam with chronic narcotics through neurosurgery

## 2017-01-24 NOTE — Assessment & Plan Note (Signed)
S: controlled on  half losartan 100mg .  BP Readings from Last 3 Encounters:  01/24/17 122/82  10/18/16 110/60  07/20/16 130/76  A/P: We discussed blood pressure goal of <140/90. Continue current meds

## 2017-01-24 NOTE — Patient Instructions (Addendum)
High dose flu shot today  [place] job with 7 lbs down since march!   Please stop by lab before you go

## 2017-01-24 NOTE — Progress Notes (Signed)
Subjective:  Kevin Rogers is a 83 y.o. year old very pleasant male patient who presents for/with See problem oriented charting ROS- continued low back pain. No chest pain or shortness of breath. No headache or blurry vision.    Past Medical History-  Patient Active Problem List   Diagnosis Date Noted  . Chronic lymphocytic leukemia ([place]) 03/08/2007    Priority: High  . Diabetes mellitus type II, controlled ([place]) 03/08/2007    Priority: High  . History of osteomyelitis of lumbar vertebrae     Priority: Medium  . Hyperlipidemia     Priority: Medium  . CKD (chronic kidney disease), stage III 01/13/2015    Priority: Medium  . Lumbar spinal stenosis 06/26/2014    Priority: Medium  . BPH (benign prostatic hyperplasia) 06/24/2014    Priority: Medium  . Gout 03/08/2007    Priority: Medium  . Essential hypertension 03/08/2007    Priority: Medium  . Enteritis due to Clostridium difficile     Priority: Low  . Discitis of lumbar region     Priority: Low  . B12 deficiency 12/12/2008    Priority: Low  . Diabetic polyneuropathy ([place]) 12/12/2008    Priority: Low  . ERECTILE DYSFUNCTION 03/18/2008    Priority: Low  . Glaucoma 03/08/2007    Priority: Low  . Osteoarthritis 03/08/2007    Priority: Low    Medications- reviewed and updated Current Outpatient Prescriptions  Medication Sig Dispense Refill  . acetaminophen (TYLENOL) 500 MG tablet Take 500-1,000 mg by mouth every 6 (six) hours as needed (pain). Reported on 07/15/2015    . finasteride (PROSCAR) 5 MG tablet TAKE 1 TABLET (5 MG TOTAL) BY MOUTH DAILY. 90 tablet 3  . glipiZIDE (GLUCOTROL XL) 5 MG 24 hr tablet TAKE 1 TABLET (5 MG TOTAL) BY MOUTH DAILY. 90 tablet 1  . glucose blood (ONETOUCH VERIO) test strip Use to test blood sugars daily. Dx: E11.9 100 each 12  . losartan (COZAAR) 100 MG tablet Take 1 tablet by mouth  daily 90 tablet 2  . ONE TOUCH LANCETS MISC Use to test blood sugars. Dx:E11.9 100 each 11  . tamsulosin (FLOMAX)  0.4 MG CAPS capsule Take 1 capsule (0.4 mg total) by mouth daily. 90 capsule 3  . TRAVATAN Z 0.004 % SOLN ophthalmic solution Place 1 drop into the left eye at bedtime.     . vitamin B-12 (CYANOCOBALAMIN) 1000 MCG tablet Take 1,000 mcg by mouth daily.     No current facility-administered medications for this visit.     Objective: BP 122/82 (BP Location: Left Arm, Patient Position: Sitting, Cuff Size: Large)   Pulse 64   Temp 98.4 F (36.9 C) (Oral)   Ht 5\' 10"  (1.778 m)   Wt 248 lb 3.2 oz (112.6 kg)   SpO2 95%   BMI 35.61 kg/m  Gen: NAD, resting comfortably CV: RRR no murmurs rubs or gallops Lungs: CTAB no crackles, wheeze, rhonchi Abdomen: obese but improved Ext: no edema Skin: warm, dry Neuro: walks with walker  Assessment/Plan:  CLL- continues to follow with oncology  Essential hypertension S: controlled on  half losartan 100mg .  BP Readings from Last 3 Encounters:  01/24/17 122/82  10/18/16 110/60  07/20/16 130/76  A/P: We discussed blood pressure goal of <140/90. Continue current meds   Gout S: last visit opted to trial off allopurinol as never had a hot or swollen joint- had painful areas in his legs but thought to be related to arthritis either in the joints  or in the low back.  A/P: no gout flares- we will monitor but may never have been truly been gout- issues may have bene referred from back  Diabetes mellitus type II, controlled S: well controlled. On glipizide 5 mg 24 hr.  CBGs-  denies any lows Exercise and diet- riding bike  And doing some hand machines- twice a week- down 7 lbs from march Lab Results  Component Value Date   HGBA1C 6.0 07/20/2016   HGBA1C 6.0 01/18/2016   HGBA1C 5.6 07/08/2015   A/P: update a1c, suspect continues to do well  BPH (benign prostatic hyperplasia) S: compliant with finasteride and urinating once a night- improved from prior twice a night. Some tenderness in breast at times- could be from medicine- actually none since  last visit.   Also on flomax A/P: continue current medicines- doin gwell  CKD (chronic kidney disease), stage III S: CKD III- Cr in past 1.2-1.4 and had bumped to 1.5 before going back to baseline A/P: update bmet/cmet . Continue off mobic  Lumbar spinal stenosis S: chronic back pain and OA in shoulders- high level without meloxicam but off med due to CKD III- checking bmp every 6 months. On hydrocodone or oxycodone for back pain through Dr. Brien Few neurosurgery- takes it BID.  A/P: continue without meloxicam with chronic narcotics through neurosurgery  Future Appointments Date Time Provider [place]  02/09/2017 9:00 AM [facility] 6 [facility] None  02/09/2017 9:30 AM Shadad, Mathis Dad, MD [facility] None   6 month CPE  Return precautions advised.  Garret Reddish, MD

## 2017-01-24 NOTE — Assessment & Plan Note (Signed)
S: well controlled. On glipizide 5 mg 24 hr.  CBGs-  denies any lows Exercise and diet- riding bike  And doing some hand machines- twice a week- down 7 lbs from march Lab Results  Component Value Date   HGBA1C 6.0 07/20/2016   HGBA1C 6.0 01/18/2016   HGBA1C 5.6 07/08/2015   A/P: update a1c, suspect continues to do well

## 2017-01-24 NOTE — Assessment & Plan Note (Signed)
S: last visit opted to trial off allopurinol as never had a hot or swollen joint- had painful areas in his legs but thought to be related to arthritis either in the joints or in the low back.  A/P: no gout flares- we will monitor but may never have been truly been gout- issues may have bene referred from back

## 2017-01-24 NOTE — Addendum Note (Signed)
Addended by: Lucianne Lei M on: 01/24/2017 10:34 AM   Modules accepted: Orders

## 2017-01-24 NOTE — Assessment & Plan Note (Signed)
S: compliant with finasteride and urinating once a night- improved from prior twice a night. Some tenderness in breast at times- could be from medicine- actually none since last visit.   Also on flomax A/P: continue current medicines- doin gwell

## 2017-02-09 ENCOUNTER — Ambulatory Visit (HOSPITAL_BASED_OUTPATIENT_CLINIC_OR_DEPARTMENT_OTHER): Payer: Medicare Other | Admitting: Oncology

## 2017-02-09 ENCOUNTER — Other Ambulatory Visit (HOSPITAL_BASED_OUTPATIENT_CLINIC_OR_DEPARTMENT_OTHER): Payer: Medicare Other

## 2017-02-09 ENCOUNTER — Telehealth: Payer: Self-pay | Admitting: Oncology

## 2017-02-09 VITALS — BP 133/67 | HR 65 | Temp 98.1°F | Resp 18 | Ht 70.0 in | Wt 251.1 lb

## 2017-02-09 DIAGNOSIS — C9110 Chronic lymphocytic leukemia of B-cell type not having achieved remission: Secondary | ICD-10-CM

## 2017-02-09 DIAGNOSIS — D72820 Lymphocytosis (symptomatic): Secondary | ICD-10-CM

## 2017-02-09 LAB — CBC WITH DIFFERENTIAL/PLATELET
BASO%: 0.1 % (ref 0.0–2.0)
Basophils Absolute: 0 10*3/uL (ref 0.0–0.1)
EOS%: 0.5 % (ref 0.0–7.0)
Eosinophils Absolute: 0.1 10*3/uL (ref 0.0–0.5)
HCT: 39.8 % (ref 38.4–49.9)
HGB: 13.3 g/dL (ref 13.0–17.1)
LYMPH%: 76 % — ABNORMAL HIGH (ref 14.0–49.0)
MCH: 29.9 pg (ref 27.2–33.4)
MCHC: 33.4 g/dL (ref 32.0–36.0)
MCV: 89.4 fL (ref 79.3–98.0)
MONO#: 0.2 10*3/uL (ref 0.1–0.9)
MONO%: 1.7 % (ref 0.0–14.0)
NEUT#: 3 10*3/uL (ref 1.5–6.5)
NEUT%: 21.7 % — ABNORMAL LOW (ref 39.0–75.0)
Platelets: 144 10*3/uL (ref 140–400)
RBC: 4.45 10*6/uL (ref 4.20–5.82)
RDW: 13.5 % (ref 11.0–14.6)
WBC: 13.9 10*3/uL — ABNORMAL HIGH (ref 4.0–10.3)
lymph#: 10.5 10*3/uL — ABNORMAL HIGH (ref 0.9–3.3)
nRBC: 0 % (ref 0–0)

## 2017-02-09 NOTE — Progress Notes (Signed)
[facility] PROGRESS NOTE 02/09/17   DIAGNOSIS: 83 year old gentleman diagnosed with lymphocytosis diagnosed in October 2014. Differential diagnosis include the lymphoproliferative disorder such as a CLL.  CURRENT THERAPY: Observation and surveillance.  INTERVAL HISTORY:  Mr. Yohn presents today for a follow-up visit with his wife. Since his last visit, he reports no changes in his health. He continues to have excellent quality of life without any decline.He is ambulating without any major difficulties using a walker and have not had any recent hospitalization or illnesses.  He denied any fevers or chills or sweats. He denied any lymphadenopathy or constitutional symptoms. He denies any excessive fatigue or appetite changes. His appetite has improved and gained weight.  He denies any headaches, blurry vision, syncope or seizures. He does not report any chest pain, palpitation, orthopnea or leg edema. Does not report any cough, wheezing or hemoptysis. He does not report any abdominal pain, nausea, vomiting, constipation, diarrhea. He does not report any dysuria, urinary frequency or hematuria.The remainder review of systems unremarkable.  MEDICAL HISTORY: Past Medical History:  Diagnosis Date  . Acute bronchitis 09/12/2007   Qualifier: Diagnosis of  By: [name] MD, [name]   . Diabetes mellitus type 2 with complications ([place]) 5188  . Glaucoma    left eye  . Gout   . Hyperlipidemia   . Hypertension   . Leukemia, chronic ([place]) 2001   does not take treatment just has blood levels checked once a year    IALLERGIES:  has No Known Allergies.  MEDICATIONS: has a current medication list which includes the following prescription(s): acetaminophen, finasteride, glipizide, glucose blood, losartan, one touch lancets, tamsulosin, travatan z, and vitamin b-12.   PHYSICAL EXAMINATION: ECOG PERFORMANCE STATUS: 1  Blood pressure 133/67, pulse 65, temperature 98.1 F  (36.7 C), temperature source Oral, resp. rate 18, height 5\' 10"  (1.778 m), weight 251 lb 1.6 oz (113.9 kg), SpO2 99 %.  GENERAL: Well-appearing gentleman without distress. SKIN: no petechiae or ecchymosis. EYES: Sclerae is anicteric. Oral mucosa without masses or lesions. NECK: supple without lymphadenopathy. LYMPH:  no palpable lymphadenopathy in the cervical, axillary or supraclavicular LUNGS: clear to auscultation and percussion without rhonchi, or wheezes. HEART: regular rate & rhythm and no murmurs rubs or gallops. ABDOMEN:abdomen soft, non-tender and normal bowel sounds. No shifting dullness or ascites. Musculoskeletal:no cyanosis of digits and no clubbing  NEURO: alert & oriented x 3 with fluent speech, no focal motor/sensory deficits  LABORATORY DATA: Results for orders placed or performed in visit on 02/09/17 (from the past 48 hour(s))  CBC with Differential/Platelet     Status: Abnormal   Collection Time: 02/09/17  8:51 AM  Result Value Ref Range   WBC 13.9 (H) 4.0 - 10.3 10e3/uL   NEUT# 3.0 1.5 - 6.5 10e3/uL   HGB 13.3 13.0 - 17.1 g/dL   HCT 39.8 38.4 - 49.9 %   Platelets 144 140 - 400 10e3/uL   MCV 89.4 79.3 - 98.0 fL   MCH 29.9 27.2 - 33.4 pg   MCHC 33.4 32.0 - 36.0 g/dL   RBC 4.45 4.20 - 5.82 10e6/uL   RDW 13.5 11.0 - 14.6 %   lymph# 10.5 (H) 0.9 - 3.3 10e3/uL   MONO# 0.2 0.1 - 0.9 10e3/uL   Eosinophils Absolute 0.1 0.0 - 0.5 10e3/uL   Basophils Absolute 0.0 0.0 - 0.1 10e3/uL   NEUT% 21.7 (L) 39.0 - 75.0 %   LYMPH% 76.0 (H) 14.0 - 49.0 %   MONO%  1.7 0.0 - 14.0 %   EOS% 0.5 0.0 - 7.0 %   BASO% 0.1 0.0 - 2.0 %   nRBC 0 0 - 0 %       ASSESSMENT: [name] 83 y.o. male with:   1. 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 This was initially diagnosed in 2014 with his lymphocytosis dates back to 2007.   His white cell count today is unchanged from previous counts including lymphocyte percentage.   The  plan is to continue with active surveillance at this time without any indication for treatment. Indication for treatment were reviewed today which include rapid increase in his lymphocyte count, cytopenia or constitutional symptoms. The likelihood of transforming into an acute symptomatic process is very low at this time.  2. Follow-up: Will be in 12 months.  Zola Button MD 02/09/17

## 2017-02-09 NOTE — Telephone Encounter (Signed)
Scheduled appt per 10/11 los - Gave patient AVS and calender per los.  

## 2017-02-15 DIAGNOSIS — I10 Essential (primary) hypertension: Secondary | ICD-10-CM | POA: Diagnosis not present

## 2017-02-15 DIAGNOSIS — M461 Sacroiliitis, not elsewhere classified: Secondary | ICD-10-CM | POA: Diagnosis not present

## 2017-02-20 ENCOUNTER — Other Ambulatory Visit: Payer: Self-pay

## 2017-02-20 MED ORDER — GLIPIZIDE ER 5 MG PO TB24: ORAL_TABLET | ORAL | 1 refills | Status: DC

## 2017-03-01 ENCOUNTER — Other Ambulatory Visit: Payer: Self-pay

## 2017-03-01 MED ORDER — LOSARTAN POTASSIUM 100 MG PO TABS: 100.0000 mg | ORAL_TABLET | Freq: Every day | ORAL | 2 refills | Status: DC

## 2017-03-27 DIAGNOSIS — M47817 Spondylosis without myelopathy or radiculopathy, lumbosacral region: Secondary | ICD-10-CM | POA: Diagnosis not present

## 2017-03-27 DIAGNOSIS — M461 Sacroiliitis, not elsewhere classified: Secondary | ICD-10-CM | POA: Diagnosis not present

## 2017-03-27 DIAGNOSIS — M48061 Spinal stenosis, lumbar region without neurogenic claudication: Secondary | ICD-10-CM | POA: Diagnosis not present

## 2017-03-27 DIAGNOSIS — M961 Postlaminectomy syndrome, not elsewhere classified: Secondary | ICD-10-CM | POA: Diagnosis not present

## 2017-03-31 ENCOUNTER — Encounter: Payer: Self-pay | Admitting: Gastroenterology

## 2017-04-12 DIAGNOSIS — M47817 Spondylosis without myelopathy or radiculopathy, lumbosacral region: Secondary | ICD-10-CM | POA: Diagnosis not present

## 2017-06-13 DIAGNOSIS — E119 Type 2 diabetes mellitus without complications: Secondary | ICD-10-CM | POA: Diagnosis not present

## 2017-06-13 DIAGNOSIS — H25013 Cortical age-related cataract, bilateral: Secondary | ICD-10-CM | POA: Diagnosis not present

## 2017-06-13 DIAGNOSIS — M47814 Spondylosis without myelopathy or radiculopathy, thoracic region: Secondary | ICD-10-CM | POA: Diagnosis not present

## 2017-06-13 DIAGNOSIS — H5203 Hypermetropia, bilateral: Secondary | ICD-10-CM | POA: Diagnosis not present

## 2017-06-13 DIAGNOSIS — H401131 Primary open-angle glaucoma, bilateral, mild stage: Secondary | ICD-10-CM | POA: Diagnosis not present

## 2017-06-13 LAB — HM DIABETES EYE EXAM

## 2017-06-16 ENCOUNTER — Encounter: Payer: Self-pay | Admitting: Family Medicine

## 2017-06-21 DIAGNOSIS — M47814 Spondylosis without myelopathy or radiculopathy, thoracic region: Secondary | ICD-10-CM | POA: Diagnosis not present

## 2017-07-06 ENCOUNTER — Other Ambulatory Visit: Payer: Self-pay

## 2017-07-06 MED ORDER — FINASTERIDE 5 MG PO TABS: 5.0000 mg | ORAL_TABLET | Freq: Every day | ORAL | 3 refills | Status: DC

## 2017-07-20 DIAGNOSIS — M961 Postlaminectomy syndrome, not elsewhere classified: Secondary | ICD-10-CM | POA: Diagnosis not present

## 2017-07-20 DIAGNOSIS — M48061 Spinal stenosis, lumbar region without neurogenic claudication: Secondary | ICD-10-CM | POA: Diagnosis not present

## 2017-07-20 DIAGNOSIS — M461 Sacroiliitis, not elsewhere classified: Secondary | ICD-10-CM | POA: Diagnosis not present

## 2017-07-20 DIAGNOSIS — M47817 Spondylosis without myelopathy or radiculopathy, lumbosacral region: Secondary | ICD-10-CM | POA: Diagnosis not present

## 2017-07-20 DIAGNOSIS — M47814 Spondylosis without myelopathy or radiculopathy, thoracic region: Secondary | ICD-10-CM | POA: Diagnosis not present

## 2017-07-24 ENCOUNTER — Encounter: Payer: Self-pay | Admitting: Family Medicine

## 2017-07-24 ENCOUNTER — Ambulatory Visit: Payer: Medicare Other | Admitting: Family Medicine

## 2017-07-24 VITALS — BP 130/70 | HR 64 | Temp 98.2°F | Ht 70.0 in | Wt 254.4 lb

## 2017-07-24 DIAGNOSIS — M48061 Spinal stenosis, lumbar region without neurogenic claudication: Secondary | ICD-10-CM

## 2017-07-24 DIAGNOSIS — N183 Chronic kidney disease, stage 3 unspecified: Secondary | ICD-10-CM

## 2017-07-24 DIAGNOSIS — R351 Nocturia: Secondary | ICD-10-CM | POA: Diagnosis not present

## 2017-07-24 DIAGNOSIS — Z1211 Encounter for screening for malignant neoplasm of colon: Secondary | ICD-10-CM

## 2017-07-24 DIAGNOSIS — E1122 Type 2 diabetes mellitus with diabetic chronic kidney disease: Secondary | ICD-10-CM | POA: Diagnosis not present

## 2017-07-24 DIAGNOSIS — N401 Enlarged prostate with lower urinary tract symptoms: Secondary | ICD-10-CM

## 2017-07-24 DIAGNOSIS — E785 Hyperlipidemia, unspecified: Secondary | ICD-10-CM | POA: Diagnosis not present

## 2017-07-24 DIAGNOSIS — M1009 Idiopathic gout, multiple sites: Secondary | ICD-10-CM | POA: Diagnosis not present

## 2017-07-24 DIAGNOSIS — N182 Chronic kidney disease, stage 2 (mild): Secondary | ICD-10-CM | POA: Diagnosis not present

## 2017-07-24 DIAGNOSIS — Z0000 Encounter for general adult medical examination without abnormal findings: Secondary | ICD-10-CM | POA: Diagnosis not present

## 2017-07-24 LAB — LIPID PANEL
Cholesterol: 206 mg/dL — ABNORMAL HIGH (ref 0–200)
HDL: 52.6 mg/dL (ref >39.00)
LDL Cholesterol: 120 mg/dL — ABNORMAL HIGH (ref 0–99)
NonHDL: 153.86
Total CHOL/HDL Ratio: 4
Triglycerides: 170 mg/dL — ABNORMAL HIGH (ref 0.0–149.0)
VLDL: 34 mg/dL (ref 0.0–40.0)

## 2017-07-24 LAB — CBC
HCT: 39.5 % (ref 39.0–52.0)
Hemoglobin: 13 g/dL (ref 13.0–17.0)
MCHC: 33 g/dL (ref 30.0–36.0)
MCV: 90.5 fl (ref 78.0–100.0)
Platelets: 168 10*3/uL (ref 150.0–400.0)
RBC: 4.37 Mil/uL (ref 4.22–5.81)
RDW: 13.4 % (ref 11.5–15.5)
WBC: 12.7 10*3/uL — ABNORMAL HIGH (ref 4.0–10.5)

## 2017-07-24 LAB — COMPREHENSIVE METABOLIC PANEL
ALT: 13 U/L (ref 0–53)
AST: 12 U/L (ref 0–37)
Albumin: 3.8 g/dL (ref 3.5–5.2)
Alkaline Phosphatase: 64 U/L (ref 39–117)
BUN: 25 mg/dL — ABNORMAL HIGH (ref 6–23)
CO2: 27 mEq/L (ref 19–32)
Calcium: 10 mg/dL (ref 8.4–10.5)
Chloride: 106 mEq/L (ref 96–112)
Creatinine, Ser: 1.36 mg/dL (ref 0.40–1.50)
GFR: 64.3 mL/min (ref >60.00)
Glucose, Bld: 140 mg/dL — ABNORMAL HIGH (ref 70–99)
Potassium: 4.5 mEq/L (ref 3.5–5.1)
Sodium: 141 mEq/L (ref 135–145)
Total Bilirubin: 1.3 mg/dL — ABNORMAL HIGH (ref 0.2–1.2)
Total Protein: 6.6 g/dL (ref 6.0–8.3)

## 2017-07-24 LAB — URIC ACID: Uric Acid, Serum: 8.9 mg/dL — ABNORMAL HIGH (ref 4.0–7.8)

## 2017-07-24 LAB — HEMOGLOBIN A1C: Hgb A1c MFr Bld: 6.6 % — ABNORMAL HIGH (ref 4.6–6.5)

## 2017-07-24 MED ORDER — ATORVASTATIN CALCIUM 10 MG PO TABS: 10.0000 mg | ORAL_TABLET | ORAL | 3 refills | Status: DC

## 2017-07-24 NOTE — Assessment & Plan Note (Signed)
S: mild poorly controlled on no rx. No myalgias.  Lab Results  Component Value Date   CHOL 189 07/20/2016   HDL 50.80 07/20/2016   LDLCALC 112 (H) 07/20/2016   LDLDIRECT 128.4 08/05/2013   TRIG 132.0 07/20/2016   CHOLHDL 4 07/20/2016   A/P: we discussed with diabetes trial atorvastatin 10mg  once a week.

## 2017-07-24 NOTE — Assessment & Plan Note (Signed)
Lumbar stenosis- also with OA in shoulders- pain went up off mobic but does have CKD III. On chronic narcotics through Dr. Brien Few of neurosurgery. Still walking with walker.

## 2017-07-24 NOTE — Assessment & Plan Note (Signed)
S: remains off mobic.  BP at goal.  A/P:Will monitor creatinine today.

## 2017-07-24 NOTE — Patient Instructions (Addendum)
Please stop by lab before you go  Get your tetanus shot at your pharmacy  Start atorvastatin 10mg  once a week for cholesterol. Your cholesterol is only slightly high so a very low dose like this is reasonable and has been found particularly helpful for folks with diabetes.   I would also like for you to sign up for an annual wellness visit on 10/18/17 or later with one of our nurses, Cassie or Manuela Schwartz, who both specialize in the annual wellness visit. This is a free benefit under medicare that may help [place] find additional ways to help you. Some highlights are reviewing medications, lifestyle, and doing a dementia screen.   See me in 6 months as well

## 2017-07-24 NOTE — Assessment & Plan Note (Signed)
S: well controlled. On glipizide 5mg  24 hr Exercise and diet- biking some and some hand machines Lab Results  Component Value Date   HGBA1C 6.0 01/24/2017   HGBA1C 6.0 07/20/2016   HGBA1C 6.0 01/18/2016   A/P: update a1c today and foot exam

## 2017-07-24 NOTE — Assessment & Plan Note (Signed)
S: patient peeing about once a night down from twice a night. In past at times has had some breast tenderness. Also on flomax.  A/P: we discussed doing rectal and psa- at his age and risks from other health issues- he opts out of this. Very reasonable

## 2017-07-24 NOTE — Progress Notes (Addendum)
Phone: [phone]  Subjective:  Patient presents today for their annual physical. Chief complaint-noted.   See problem oriented charting- ROS- full  review of systems was completed and negative except for: some issues with hearing, continued back pain  The following were reviewed and entered/updated in epic: Past Medical History:  Diagnosis Date  . Acute bronchitis 09/12/2007   Qualifier: Diagnosis of  By: [name] MD, [name]   . Diabetes mellitus type 2 with complications ([place]) 1443  . Glaucoma    left eye  . Gout   . Hyperlipidemia   . Hypertension   . Leukemia, chronic ([place]) 2001   does not take treatment just has blood levels checked once a year   Patient Active Problem List   Diagnosis Date Noted  . Chronic lymphocytic leukemia ([employer]) 03/08/2007    Priority: High  . Diabetes mellitus type II, controlled ([place]) 03/08/2007    Priority: High  . History of osteomyelitis of lumbar vertebrae     Priority: Medium  . Hyperlipidemia     Priority: Medium  . CKD (chronic kidney disease), stage III ([place]) 01/13/2015    Priority: Medium  . Lumbar spinal stenosis 06/26/2014    Priority: Medium  . BPH (benign prostatic hyperplasia) 06/24/2014    Priority: Medium  . Gout 03/08/2007    Priority: Medium  . Essential hypertension 03/08/2007    Priority: Medium  . Enteritis due to Clostridium difficile     Priority: Low  . Discitis of lumbar region     Priority: Low  . B12 deficiency 12/12/2008    Priority: Low  . Diabetic polyneuropathy ([place]) 12/12/2008    Priority: Low  . ERECTILE DYSFUNCTION 03/18/2008    Priority: Low  . Glaucoma 03/08/2007    Priority: Low  . Osteoarthritis 03/08/2007    Priority: Low   Past Surgical History:  Procedure Laterality Date  . ANTERIOR LAT LUMBAR FUSION Left 01/03/2013   Procedure: ANTERIOR LATERAL LUMBAR FUSION lumbar three-four;  Surgeon: [name], MD;  Location: [facility];  Service: Neurosurgery;  Laterality: Left;  . BACK  SURGERY     lumbar fusion - 2016, feb  . COLONOSCOPY W/ POLYPECTOMY    . DENTAL SURGERY     implanted teeth x 2  . HERNIA REPAIR  1989  . LUMBAR LAMINECTOMY  1997  . LUMBAR PERCUTANEOUS PEDICLE SCREW 1 LEVEL  01/03/2013   Procedure: LUMBAR PERCUTANEOUS PEDICLE SCREWS LUMBAR THREE-FOUR;  Surgeon: [name], MD;  Location: [facility];  Service: Neurosurgery;;  . LUMBAR WOUND DEBRIDEMENT N/A 07/23/2014   Procedure: LUMBAR WOUND DEBRIDEMENT;  Surgeon: [name], MD;  Location: [facility];  Service: Neurosurgery;  Laterality: N/A;  . TONSILLECTOMY      Family History  Problem Relation Age of Onset  . Heart disease Mother        30, second hand  . Heart disease Father        52, former smoker    Medications- reviewed and updated Current Outpatient Medications  Medication Sig Dispense Refill  . acetaminophen (TYLENOL) 500 MG tablet Take 500-1,000 mg by mouth every 6 (six) hours as needed (pain). Reported on 07/15/2015    . finasteride (PROSCAR) 5 MG tablet Take 1 tablet (5 mg total) by mouth daily. 90 tablet 3  . glipiZIDE (GLUCOTROL XL) 5 MG 24 hr tablet TAKE 1 TABLET (5 MG TOTAL) BY MOUTH DAILY. 90 tablet 1  . glucose blood (ONETOUCH VERIO) test strip Use to test blood  sugars daily. Dx: E11.9 100 each 12  . HYDROcodone-acetaminophen (NORCO/VICODIN) 5-325 MG tablet Take 1-2 tablets by mouth every 6 (six) hours as needed.    [name] losartan (COZAAR) 100 MG tablet Take 1 tablet (100 mg total) by mouth daily. 90 tablet 2  . ONE TOUCH LANCETS MISC Use to test blood sugars. Dx:E11.9 100 each 11  . tamsulosin (FLOMAX) 0.4 MG CAPS capsule Take 1 capsule (0.4 mg total) by mouth daily. 90 capsule 3  . TRAVATAN Z 0.004 % SOLN ophthalmic solution Place 1 drop into the left eye at bedtime.     . vitamin B-12 (CYANOCOBALAMIN) 1000 MCG tablet Take 1,000 mcg by mouth daily.    [name] atorvastatin (LIPITOR) 10 MG tablet Take 1 tablet (10 mg total) by mouth once a week. 13 tablet 3   No current  facility-administered medications for this visit.     Allergies-reviewed and updated No Known Allergies  Social History   Social History Narrative   Remarried. 2017. Widowed 2009. 2 sons. 2 grandkids-boy/girl. No greatgrandkids.    Lives alone. Did everything for himself in past but back limiting him now      Retired from [employer]: golf stopped(hard with back), watch tv (pain with moving around)    Objective: BP 130/70 (BP Location: Left Arm, Patient Position: Sitting, Cuff Size: Large)   Pulse 64   Temp 98.2 F (36.8 C) (Oral)   Ht 5\' 10"  (1.778 m)   Wt 254 lb 6.4 oz (115.4 kg)   SpO2 94%   BMI 36.50 kg/m  Gen: NAD, resting comfortably HEENT: Mucous membranes are moist. Oropharynx normal Neck: no thyromegaly or cervical lymphadenopathy CV: RRR no murmurs rubs or gallops Lungs: CTAB no crackles, wheeze, rhonchi Abdomen: soft/nontender/nondistended/normal bowel sounds. No rebound or guarding.  Ext: 1+ edema Skin: warm, dry Neuro: grossly normal, moves all extremities, PERRLA. Walks with walker   Assessment/Plan:  84 y.o. male presenting for annual physical.  Health Maintenance counseling: 1. Anticipatory guidance: Patient counseled regarding regular dental exams -q6 months, eye exams - yearly for diabetesy, wearing seatbelts.  2. Risk factor reduction:  Advised patient of need for regular exercise (advised arm exercises due to his back- he is getting to [facility] twice a week some weeks) and diet rich and fruits and vegetables to reduce risk of heart attack and stroke. Advised getting weight back under 250 Wt Readings from Last 3 Encounters:  07/24/17 254 lb 6.4 oz (115.4 kg)  02/09/17 251 lb 1.6 oz (113.9 kg)  01/24/17 248 lb 3.2 oz (112.6 kg)  3. Immunizations/screenings/ancillary studies decclines shingrix- states never had chicken pox. Td at pharmacy advised Immunization History  Administered Date(s) Administered  . Influenza Split 02/10/2011,  01/11/2012  . Influenza Whole 03/15/2007, 01/31/2008, 02/04/2009, 02/01/2010  . Influenza, High Dose Seasonal PF 01/18/2016, 01/24/2017  . Influenza,inj,Quad PF,6+ Mos 02/12/2014, 01/13/2015  . Pneumococcal Conjugate-13 03/23/2015  . Pneumococcal Polysaccharide-23 03/24/2009, 02/12/2014  . Td 03/09/2007  4. Prostate cancer screening- see bph section  Lab Results  Component Value Date   PSA 0.51 07/08/2015   PSA 0.66 08/05/2013   PSA 0.80 07/23/2012   5. Colon cancer screening - wants to do stool cards through age 17- will set up today  Status of chronic or acute concerns  CLL- follows with oncology Dr. Alen Blew  HTN- controlled on losartan 50mg  (half of 100mg  pill)  Gout- has been off allopurinol about a year now. Not 100% clear if has  had true gout flares in past. Will get uric acid level today.   Plans [facility] in [facility] at [facility]- wife has noted issues  Hyperlipidemia S: mild poorly controlled on no rx. No myalgias.  Lab Results  Component Value Date   CHOL 189 07/20/2016   HDL 50.80 07/20/2016   LDLCALC 112 (H) 07/20/2016   LDLDIRECT 128.4 08/05/2013   TRIG 132.0 07/20/2016   CHOLHDL 4 07/20/2016   A/P: we discussed with diabetes trial atorvastatin 10mg  once a week.   Diabetes mellitus type II, controlled S: well controlled. On glipizide 5mg  24 hr Exercise and diet- biking some and some hand machines Lab Results  Component Value Date   HGBA1C 6.0 01/24/2017   HGBA1C 6.0 07/20/2016   HGBA1C 6.0 01/18/2016   A/P: update a1c today and foot exam  BPH (benign prostatic hyperplasia) S: patient peeing about once a night down from twice a night. In past at times has had some breast tenderness. Also on flomax.  A/P: we discussed doing rectal and psa- at his age and risks from other health issues- he opts out of this. Very reasonable  CKD (chronic kidney disease), stage III S: remains off mobic.  BP at goal.  A/P:Will monitor creatinine today.  Lumbar spinal  stenosis Lumbar stenosis- also with OA in shoulders- pain went up off mobic but does have CKD III. On chronic narcotics through Dr. Brien Few of neurosurgery. Still walking with walker.   Future Appointments  Date Time Provider [place]  02/08/2018  9:00 AM [facility] ONLY [facility] None  02/08/2018  9:30 AM Shadad, Mathis Dad, MD [facility] None   No follow-ups on file.  Lab/Order associations: Screen for colon cancer - Plan: Hemoccult Cards (X3 cards)  Hyperlipidemia, unspecified hyperlipidemia type  Controlled type 2 diabetes mellitus with stage 2 chronic kidney disease, without long-term current use of insulin ([place]) - Plan: CBC, Comprehensive metabolic panel, Lipid panel, Hemoglobin A1c  Benign prostatic hyperplasia with nocturia  CKD (chronic kidney disease), stage III ([place]) - Plan: Comprehensive metabolic panel  Spinal stenosis of lumbar region, unspecified whether neurogenic claudication present  Idiopathic gout of multiple sites, unspecified chronicity - Plan: Uric acid  Meds ordered this encounter  Medications  . atorvastatin (LIPITOR) 10 MG tablet    Sig: Take 1 tablet (10 mg total) by mouth once a week.    Dispense:  13 tablet    Refill:  3    Return precautions advised.  Garret Reddish, MD

## 2017-08-21 ENCOUNTER — Other Ambulatory Visit: Payer: Self-pay

## 2017-08-21 MED ORDER — GLIPIZIDE ER 5 MG PO TB24: ORAL_TABLET | ORAL | 1 refills | Status: DC

## 2017-09-11 ENCOUNTER — Telehealth: Payer: Self-pay | Admitting: Family Medicine

## 2017-09-11 NOTE — Telephone Encounter (Signed)
°  Relation to pt: self Call back number: [phone] [facility] [address], [place] [address] [phone]     Reason for call:  Patient would like to know if 2 pills can be called into the pharmacy mentioned below, patient was informed to be please allow 84 yo 72 hour turn around time, patient stated he would be back in [place] and didn't want to go a day without, please advise

## 2017-09-11 NOTE — Telephone Encounter (Signed)
Copied from [place] [id number]. Topic: Quick Communication - Rx Refill/Question >> Sep 11, 2017  1:56 PM Scherrie Gerlach wrote: Medication: tamsulosin (FLOMAX) 0.4 MG CAPS capsule pharmacy is calling from [place]. Pt there visiting. Out of this med.  [facility]ing ref[facility] for 14 days [id number]  Address: [address] [place] [address] Phone:  [phone]  Fax [phone]

## 2017-09-12 MED ORDER — TAMSULOSIN HCL 0.4 MG PO CAPS: 0.4000 mg | ORAL_CAPSULE | Freq: Every day | ORAL | 0 refills | Status: DC

## 2017-09-12 NOTE — Telephone Encounter (Signed)
Flomax refill Last OV: 07/24/17 Last Refill:11/21/16 #90 capsules 3 RF [facility] [place] [place] PCP: Garret Reddish MD

## 2017-10-06 ENCOUNTER — Other Ambulatory Visit: Payer: Self-pay | Admitting: Family Medicine

## 2017-10-23 DIAGNOSIS — M7918 Myalgia, other site: Secondary | ICD-10-CM | POA: Diagnosis not present

## 2017-10-23 DIAGNOSIS — Z79899 Other long term (current) drug therapy: Secondary | ICD-10-CM | POA: Diagnosis not present

## 2017-10-23 DIAGNOSIS — M461 Sacroiliitis, not elsewhere classified: Secondary | ICD-10-CM | POA: Diagnosis not present

## 2017-10-23 DIAGNOSIS — M961 Postlaminectomy syndrome, not elsewhere classified: Secondary | ICD-10-CM | POA: Diagnosis not present

## 2017-11-03 NOTE — Progress Notes (Signed)
Subjective:   Kevin Rogers is a 84 y.o. male who presents for Medicare Annual/Subsequent preventive examination.  Lives in two story home with wife.   Review of Systems:  No ROS.  Medicare Wellness Visit. Additional risk factors are reflected in the social history.  Sleeps 8-10 hrs/night. Wakes up feeling rested. Rarely naps.   Cardiac Risk Factors include: advanced age (>32men, >53 women);diabetes mellitus;dyslipidemia;hypertension;obesity (BMI >30kg/m2)     Objective:    Vitals: BP 130/68   Pulse 68   Resp 16   Ht 5\' 10"  (1.778 m)   Wt 253 lb 9.6 oz (115 kg)   SpO2 96%   BMI 36.39 kg/m   Body mass index is 36.39 kg/m.  Advanced Directives 11/07/2017 10/18/2016 12/08/2015 07/20/2015 06/10/2015 02/09/2015 01/27/2015  Does Patient Have a Medical Advance Directive? Yes Yes Yes Yes Yes Yes Yes  Type of Advance Directive Living will;Healthcare Power of [place];Living will Living will [place];Living will [place];Living will Living will;Healthcare Power of Attorney Living will  Does patient want to make changes to medical advance directive? No - Patient declined - - - No - Patient declined - No - Patient declined  Copy of [place] in Chart? No - copy requested No - copy requested No - copy requested - No - copy requested No - copy requested No - copy requested  Would patient like information on creating a medical advance directive? - No - Patient declined - - - - No - patient declined information    Tobacco Social History   Tobacco Use  Smoking Status Former Smoker  . Packs/day: 0.25  . Years: 20.00  . Pack years: 5.00  . Types: Cigarettes  . Last attempt to quit: 09/28/1995  . Years since quitting: 22.1  Smokeless Tobacco Never Used     Counseling given: Not Answered  Past Medical History:  Diagnosis Date  . Acute bronchitis 09/12/2007   Qualifier: Diagnosis of  By: [name] MD, [name]     . Diabetes mellitus type 2 with complications ([place]) 2119  . Glaucoma    left eye  . Gout   . Hyperlipidemia   . Hypertension   . Leukemia, chronic ([place]) 2001   does not take treatment just has blood levels checked once a year   Past Surgical History:  Procedure Laterality Date  . ANTERIOR LAT LUMBAR FUSION Left 01/03/2013   Procedure: ANTERIOR LATERAL LUMBAR FUSION lumbar three-four;  Surgeon: [name], MD;  Location: [facility];  Service: Neurosurgery;  Laterality: Left;  . BACK SURGERY     lumbar fusion - 2016, feb  . COLONOSCOPY W/ POLYPECTOMY    . DENTAL SURGERY     implanted teeth x 2  . HERNIA REPAIR  1989  . LUMBAR LAMINECTOMY  1997  . LUMBAR PERCUTANEOUS PEDICLE SCREW 1 LEVEL  01/03/2013   Procedure: LUMBAR PERCUTANEOUS PEDICLE SCREWS LUMBAR THREE-FOUR;  Surgeon: [name], MD;  Location: [facility];  Service: Neurosurgery;;  . LUMBAR WOUND DEBRIDEMENT N/A 07/23/2014   Procedure: LUMBAR WOUND DEBRIDEMENT;  Surgeon: [name], MD;  Location: [facility];  Service: Neurosurgery;  Laterality: N/A;  . TONSILLECTOMY     Family History  Problem Relation Age of Onset  . Heart disease Mother        89, second hand  . Heart disease Father        79, former smoker   Social History  Socioeconomic History  . Marital status: Married    Spouse name: Not on file  . Number of children: Not on file  . Years of education: Not on file  . Highest education level: Associate degree: occupational, Hotel manager, or vocational program  Occupational History    Comment: Retired from [employer]  . Financial resource strain: Not on file  . Food insecurity:    Worry: Not on file    Inability: Not on file  . Transportation needs:    Medical: Not on file    Non-medical: Not on file  Tobacco Use  . Smoking status: Former Smoker    Packs/day: 0.25    Years: 20.00    Pack years: 5.00    Types: Cigarettes    Last attempt to quit: 09/28/1995     Years since quitting: 22.1  . Smokeless tobacco: Never Used  Substance and Sexual Activity  . Alcohol use: Yes    Alcohol/week: 0.6 oz    Types: 1 Glasses of wine per week    Comment: rarely  . Drug use: No  . Sexual activity: Yes  Lifestyle  . Physical activity:    Days per week: Not on file    Minutes per session: Not on file  . Stress: Not on file  Relationships  . Social connections:    Talks on phone: Not on file    Gets together: Not on file    Attends religious service: Not on file    Active member of club or organization: Not on file    Attends meetings of clubs or organizations: Not on file    Relationship status: Not on file  Other Topics Concern  . Not on file  Social History Narrative   Remarried. 2017. Widowed 2009. 2 sons. 2 grandkids-boy/girl. No greatgrandkids.     Did everything for himself in past but back limiting him now      Retired from [employer]: golf stopped(hard with back), watch tv (pain with moving around)    Outpatient Encounter Medications as of 11/07/2017  Medication Sig  . acetaminophen (TYLENOL) 500 MG tablet Take 500-1,000 mg by mouth every 6 (six) hours as needed (pain). Reported on 07/15/2015  . atorvastatin (LIPITOR) 10 MG tablet Take 1 tablet (10 mg total) by mouth once a week.  . finasteride (PROSCAR) 5 MG tablet Take 1 tablet (5 mg total) by mouth daily.  [name] glipiZIDE (GLUCOTROL XL) 5 MG 24 hr tablet TAKE 1 TABLET (5 MG TOTAL) BY MOUTH DAILY.  [name] glucose blood (ONETOUCH VERIO) test strip Use to test blood sugars daily. Dx: E11.9  . HYDROcodone-acetaminophen (NORCO/VICODIN) 5-325 MG tablet Take 1-2 tablets by mouth every 6 (six) hours as needed.  [name] losartan (COZAAR) 100 MG tablet Take 1 tablet (100 mg total) by mouth daily.  . ONE TOUCH LANCETS MISC Use to test blood sugars. Dx:E11.9  . tamsulosin (FLOMAX) 0.4 MG CAPS capsule Take 1 capsule (0.4 mg total) by mouth daily.  . tamsulosin (FLOMAX) 0.4 MG CAPS capsule TAKE 1  CAPSULE BY MOUTH  DAILY  . TRAVATAN Z 0.004 % SOLN ophthalmic solution Place 1 drop into the left eye at bedtime.   . vitamin B-12 (CYANOCOBALAMIN) 1000 MCG tablet Take 1,000 mcg by mouth daily.   No facility-administered encounter medications on file as of 11/07/2017.     Activities of Daily Living In your present state of health, do you have any difficulty  performing the following activities: 11/07/2017  Hearing? N  Vision? N  Difficulty concentrating or making decisions? N  Walking or climbing stairs? N  Dressing or bathing? N  Preparing Food and eating ? N  Using the Toilet? N  In the past six months, have you accidently leaked urine? N  Do you have problems with loss of bowel control? N  Managing your Medications? N  Managing your Finances? N  Housekeeping or managing your Housekeeping? N  Some recent data might be hidden    Patient Care Team: [name], MD as PCP - General (Family Medicine) [name], MD as Consulting Physician (Neurosurgery) Ralene Bathe, MD as Consulting Physician (Ophthalmology) Wyatt Portela, MD as Consulting Physician (Oncology) Marlaine Hind, MD as Consulting Physician (Physical Medicine and Rehabilitation)   Assessment:   This is a routine wellness examination for [place].  Exercise Activities and Dietary recommendations Current Exercise Habits: Structured exercise class, Type of exercise: Other - see comments;strength training/weights, Time (Minutes): 60, Frequency (Times/Week): 2, Weekly Exercise (Minutes/Week): 120, Intensity: Moderate, Exercise limited by: None identified  Eats 2-3 meals/day. Drinks 4-5 glasses water/day. Drinks 2 cups green tea/day. Most meals made at home.  Breakfast: Egg and sausage or bacon and fruit. Drinks green tea.  Lunch/snack: Sandwich  Dinner: Meat and vegetables with breads or potatoes.  Usually has dessert daily, cake or pie or ice cream.   Goals    . I want my back to feel better         Fall Risk Fall Risk  11/07/2017 10/18/2016 07/20/2016 07/06/2015 05/05/2015  Falls in the past year? No No Yes No No  Number falls in past yr: - - 1 - -  Injury with Fall? - - Yes - -  Risk for fall due to : - Impaired balance/gait;Impaired mobility Impaired balance/gait - -  Risk for fall due to: Comment - - - - -   Depression Screen PHQ 2/9 Scores 11/07/2017 10/18/2016 07/20/2016 05/05/2015  PHQ - 2 Score 0 0 0 0  PHQ- 9 Score 0 - - -  PHQ9 completed. No signs or symptoms of depression. States he has gone to several funerals lately, states he is at that age. He is trying to focus on healing his back and getting back into the [facility]. Total time spent on topic was 9 minutes.  Cognitive Function Ad8 score reviewed for issues:  Issues making decisions:no  Less interest in hobbies / activities:no  Repeats questions, stories (family complaining):no  Trouble using ordinary gadgets (microwave, computer, phone):no  Forgets the month or year: no  Mismanaging finances: no  Remembering appts:no  Daily problems with thinking and/or memory:no Ad8 score is=0 Patient spends a lot of time researching on the computer. Discussed increasing different types of brain stimulating activities.          Immunization History  Administered Date(s) Administered  . Influenza Split 02/10/2011, 01/11/2012  . Influenza Whole 03/15/2007, 01/31/2008, 02/04/2009, 02/01/2010  . Influenza, High Dose Seasonal PF 01/18/2016, 01/24/2017  . Influenza,inj,Quad PF,6+ Mos 02/12/2014, 01/13/2015  . Pneumococcal Conjugate-13 03/23/2015  . Pneumococcal Polysaccharide-23 03/24/2009, 02/12/2014  . Td 03/09/2007  . Tdap 09/19/2017   Screening Tests Health Maintenance  Topic Date Due  . INFLUENZA VACCINE  11/30/2017  . HEMOGLOBIN A1C  01/24/2018  . OPHTHALMOLOGY EXAM  06/13/2018  . FOOT EXAM  07/25/2018  . TETANUS/TDAP  09/20/2027  . PNA vac Low Risk Adult  Completed       Plan:   Follow  up with PCP as  directed.  I have personally reviewed and noted the following in the patient's chart:   . Medical and social history . Use of alcohol, tobacco or illicit drugs  . Current medications and supplements . Functional ability and status . Nutritional status . Physical activity . Advanced directives . List of other physicians . Vitals . Screenings to include cognitive, depression, and falls . Referrals and appointments  In addition, I have reviewed and discussed with patient certain preventive protocols, quality metrics, and best practice recommendations. A written personalized care plan for preventive services as well as general preventive health recommendations were provided to patient.     Williemae Area, RN  11/07/2017

## 2017-11-03 NOTE — Progress Notes (Signed)
PCP notes:   Health maintenance: Up to date.   Abnormal screenings: None.   Patient concerns: None.   Nurse concerns: none.   Next PCP appt: 11/23/17

## 2017-11-07 ENCOUNTER — Ambulatory Visit (INDEPENDENT_AMBULATORY_CARE_PROVIDER_SITE_OTHER): Payer: Medicare Other | Admitting: *Deleted

## 2017-11-07 ENCOUNTER — Encounter: Payer: Self-pay | Admitting: *Deleted

## 2017-11-07 ENCOUNTER — Other Ambulatory Visit: Payer: Self-pay

## 2017-11-07 VITALS — BP 130/68 | HR 68 | Resp 16 | Ht 70.0 in | Wt 253.6 lb

## 2017-11-07 DIAGNOSIS — Z1211 Encounter for screening for malignant neoplasm of colon: Secondary | ICD-10-CM

## 2017-11-07 DIAGNOSIS — Z0000 Encounter for general adult medical examination without abnormal findings: Secondary | ICD-10-CM | POA: Diagnosis not present

## 2017-11-07 DIAGNOSIS — Z1331 Encounter for screening for depression: Secondary | ICD-10-CM | POA: Diagnosis not present

## 2017-11-07 NOTE — Progress Notes (Signed)
I have reviewed and agree with note, evaluation, plan.   Sebert Stollings, MD  

## 2017-11-07 NOTE — Addendum Note (Signed)
Addended by: Frutoso Chase A on: 11/07/2017 02:37 PM   Modules accepted: Orders

## 2017-11-07 NOTE — Patient Instructions (Addendum)
Mr. Tienda ,  Bring a copy of your living will and/or healthcare power of attorney to your next office visit.  Thank you for taking time to come for your Medicare Wellness Visit. I appreciate your ongoing commitment to your health goals. Please review the following plan we discussed and let me know if I can assist you in the future.   These are the goals we discussed: Goals    . I want my back to feel better        This is a list of the screening recommended for you and due dates:  Health Maintenance  Topic Date Due  . Flu Shot  11/30/2017  . Hemoglobin A1C  01/24/2018  . Eye exam for diabetics  06/13/2018  . Complete foot exam   07/25/2018  . Tetanus Vaccine  09/20/2027  . Pneumonia vaccines  Completed   Preventive Care for Adults  A healthy lifestyle and preventive care can promote health and wellness. Preventive health guidelines for adults include the following key practices.  . A routine yearly physical is a good way to check with your health care provider about your health and preventive screening. It is a chance to share any concerns and updates on your health and to receive a thorough exam.  . Visit your dentist for a routine exam and preventive care every 6 months. Brush your teeth twice a day and floss once a day. Good oral hygiene prevents tooth decay and gum disease.  . The frequency of eye exams is based on your age, health, family medical history, use  of contact lenses, and other factors. Follow your health care provider's recommendations for frequency of eye exams.  . Eat a healthy diet. Foods like vegetables, fruits, whole grains, low-fat dairy products, and lean protein foods contain the nutrients you need without too many calories. Decrease your intake of foods high in solid fats, added sugars, and salt. Eat the right amount of calories for you. Get information about a proper diet from your health care provider, if necessary.  . Regular physical exercise is one  of the most important things you can do for your health. Most adults should get at least 150 minutes of moderate-intensity exercise (any activity that increases your heart rate and causes you to sweat) each week. In addition, most adults need muscle-strengthening exercises on 2 or more days a week.  Silver Sneakers may be a benefit available to you. To determine eligibility, you may visit the website: www.silversneakers.com or contact program at [phone]) [phone] Mon-Fri between 8AM-8PM.   . Maintain a healthy weight. The body mass index (BMI) is a screening tool to identify possible weight problems. It provides an estimate of body fat based on height and weight. Your health care provider can find your BMI and can help you achieve or maintain a healthy weight.   For adults 20 years and older: ? A BMI below 18.5 is considered underweight. ? A BMI of 18.5 to 24.9 is normal. ? A BMI of 25 to 29.9 is considered overweight. ? A BMI of 30 and above is considered obese.   . Maintain normal blood lipids and cholesterol levels by exercising and minimizing your intake of saturated fat. Eat a balanced diet with plenty of fruit and vegetables. Blood tests for lipids and cholesterol should begin at age 52 and be repeated every 5 years. If your lipid or cholesterol levels are high, you are over 50, or you are at high risk for heart disease, you  may need your cholesterol levels checked more frequently. Ongoing high lipid and cholesterol levels should be treated with medicines if diet and exercise are not working.  . If you smoke, find out from your health care provider how to quit. If you do not use tobacco, please do not start.  . If you choose to drink alcohol, please do not consume more than 2 drinks per day. One drink is considered to be 12 ounces (355 mL) of beer, 5 ounces (148 mL) of wine, or 1.5 ounces (44 mL) of liquor.  . If you are 21-66 years old, ask your health care provider if you should take aspirin  to prevent strokes.  . Use sunscreen. Apply sunscreen liberally and repeatedly throughout the day. You should seek shade when your shadow is shorter than you. Protect yourself by wearing long sleeves, pants, a wide-brimmed hat, and sunglasses year round, whenever you are outdoors.  . Once a month, do a whole body skin exam, using a mirror to look at the skin on your back. Tell your health care provider of new moles, moles that have irregular borders, moles that are larger than a pencil eraser, or moles that have changed in shape or color.

## 2017-11-09 ENCOUNTER — Other Ambulatory Visit: Payer: Self-pay

## 2017-11-09 ENCOUNTER — Other Ambulatory Visit: Payer: Medicare Other

## 2017-11-09 DIAGNOSIS — Z1211 Encounter for screening for malignant neoplasm of colon: Secondary | ICD-10-CM

## 2017-11-09 LAB — HEMOCCULT SLIDES (X 3 CARDS)
Fecal Occult Blood: NEGATIVE
OCCULT 1: NEGATIVE
OCCULT 2: NEGATIVE
OCCULT 3: NEGATIVE
OCCULT 4: NEGATIVE
OCCULT 5: NEGATIVE

## 2017-11-23 ENCOUNTER — Ambulatory Visit (INDEPENDENT_AMBULATORY_CARE_PROVIDER_SITE_OTHER): Payer: Medicare Other | Admitting: Family Medicine

## 2017-11-23 ENCOUNTER — Encounter: Payer: Self-pay | Admitting: Family Medicine

## 2017-11-23 VITALS — BP 126/78 | HR 62 | Temp 97.7°F | Ht 70.0 in | Wt 253.6 lb

## 2017-11-23 DIAGNOSIS — M1009 Idiopathic gout, multiple sites: Secondary | ICD-10-CM

## 2017-11-23 DIAGNOSIS — N182 Chronic kidney disease, stage 2 (mild): Secondary | ICD-10-CM

## 2017-11-23 DIAGNOSIS — I10 Essential (primary) hypertension: Secondary | ICD-10-CM | POA: Diagnosis not present

## 2017-11-23 DIAGNOSIS — E1122 Type 2 diabetes mellitus with diabetic chronic kidney disease: Secondary | ICD-10-CM | POA: Diagnosis not present

## 2017-11-23 DIAGNOSIS — N183 Chronic kidney disease, stage 3 unspecified: Secondary | ICD-10-CM

## 2017-11-23 LAB — COMPREHENSIVE METABOLIC PANEL
ALT: 13 U/L (ref 0–53)
AST: 9 U/L (ref 0–37)
Albumin: 3.9 g/dL (ref 3.5–5.2)
Alkaline Phosphatase: 67 U/L (ref 39–117)
BUN: 18 mg/dL (ref 6–23)
CO2: 27 mEq/L (ref 19–32)
Calcium: 9.1 mg/dL (ref 8.4–10.5)
Chloride: 108 mEq/L (ref 96–112)
Creatinine, Ser: 1.4 mg/dL (ref 0.40–1.50)
GFR: 62.13 mL/min (ref >60.00)
Glucose, Bld: 142 mg/dL — ABNORMAL HIGH (ref 70–99)
Potassium: 4.9 mEq/L (ref 3.5–5.1)
Sodium: 142 mEq/L (ref 135–145)
Total Bilirubin: 0.8 mg/dL (ref 0.2–1.2)
Total Protein: 6.2 g/dL (ref 6.0–8.3)

## 2017-11-23 LAB — CBC WITH DIFFERENTIAL/PLATELET
Basophils Absolute: 0 10*3/uL (ref 0.0–0.1)
Basophils Relative: 0.2 % (ref 0.0–3.0)
Eosinophils Absolute: 0.1 10*3/uL (ref 0.0–0.7)
Eosinophils Relative: 0.9 % (ref 0.0–5.0)
HCT: 39.3 % (ref 39.0–52.0)
Hemoglobin: 12.8 g/dL — ABNORMAL LOW (ref 13.0–17.0)
Lymphocytes Relative: 74.4 % — ABNORMAL HIGH (ref 12.0–46.0)
Lymphs Abs: 11 10*3/uL — ABNORMAL HIGH (ref 0.7–4.0)
MCHC: 32.6 g/dL (ref 30.0–36.0)
MCV: 90.4 fl (ref 78.0–100.0)
Monocytes Absolute: 0.2 10*3/uL (ref 0.1–1.0)
Monocytes Relative: 1.5 % — ABNORMAL LOW (ref 3.0–12.0)
Neutro Abs: 3.4 10*3/uL (ref 1.4–7.7)
Neutrophils Relative %: 23 % — ABNORMAL LOW (ref 43.0–77.0)
Platelets: 156 10*3/uL (ref 150.0–400.0)
RBC: 4.35 Mil/uL (ref 4.22–5.81)
RDW: 14.2 % (ref 11.5–15.5)
WBC: 14.9 10*3/uL — ABNORMAL HIGH (ref 4.0–10.5)

## 2017-11-23 LAB — POCT GLYCOSYLATED HEMOGLOBIN (HGB A1C): Hemoglobin A1C: 6.5 % — AB (ref 4.0–5.6)

## 2017-11-23 LAB — LDL CHOLESTEROL, DIRECT: Direct LDL: 76 mg/dL

## 2017-11-23 NOTE — Assessment & Plan Note (Signed)
S: controlled on losartan 50mg  (half of 100mg  pill) BP Readings from Last 3 Encounters:  11/23/17 126/78  11/07/17 130/68  07/24/17 130/70  A/P: We discussed blood pressure goal of <140/90. No change

## 2017-11-23 NOTE — Assessment & Plan Note (Signed)
S: 0 flares in 4 months on no rx- has been off allopurinol for a year.  Lab Results  Component Value Date   LABURIC 8.9 (H) 07/24/2017  A/P: will continue to monitor- would need to restart allopurinol in future if starts having flare ups

## 2017-11-23 NOTE — Patient Instructions (Addendum)
Please check with your pharmacy to see if they have the shingrix vaccine. If they do- please get this immunization and update [place] by phone call or mychart with dates you receive the vaccine  No changes today  Please stop by lab before you go

## 2017-11-23 NOTE — Progress Notes (Signed)
Subjective:  Chancelor Hardrick is a 84 y.o. year old very pleasant male patient who presents for/with See problem oriented charting ROS- No chest pain or shortness of breath. No headache or blurry vision.  Continued back pain  Past Medical History-  Patient Active Problem List   Diagnosis Date Noted  . Chronic lymphocytic leukemia ([place]) 03/08/2007    Priority: High  . Diabetes mellitus type II, controlled ([place]) 03/08/2007    Priority: High  . History of osteomyelitis of lumbar vertebrae     Priority: Medium  . Hyperlipidemia     Priority: Medium  . CKD (chronic kidney disease), stage III ([place]) 01/13/2015    Priority: Medium  . Lumbar spinal stenosis 06/26/2014    Priority: Medium  . BPH (benign prostatic hyperplasia) 06/24/2014    Priority: Medium  . Gout 03/08/2007    Priority: Medium  . Essential hypertension 03/08/2007    Priority: Medium  . Enteritis due to Clostridium difficile     Priority: Low  . Discitis of lumbar region     Priority: Low  . B12 deficiency 12/12/2008    Priority: Low  . Diabetic polyneuropathy ([place]) 12/12/2008    Priority: Low  . ERECTILE DYSFUNCTION 03/18/2008    Priority: Low  . Glaucoma 03/08/2007    Priority: Low  . Osteoarthritis 03/08/2007    Priority: Low    Medications- reviewed and updated Current Outpatient Medications  Medication Sig Dispense Refill  . acetaminophen (TYLENOL) 500 MG tablet Take 500-1,000 mg by mouth every 6 (six) hours as needed (pain). Reported on 07/15/2015    . atorvastatin (LIPITOR) 10 MG tablet Take 1 tablet (10 mg total) by mouth once a week. 13 tablet 3  . finasteride (PROSCAR) 5 MG tablet Take 1 tablet (5 mg total) by mouth daily. 90 tablet 3  . glipiZIDE (GLUCOTROL XL) 5 MG 24 hr tablet TAKE 1 TABLET (5 MG TOTAL) BY MOUTH DAILY. 90 tablet 1  . glucose blood (ONETOUCH VERIO) test strip Use to test blood sugars daily. Dx: E11.9 100 each 12  . HYDROcodone-acetaminophen (NORCO/VICODIN) 5-325 MG tablet Take 1-2  tablets by mouth every 6 (six) hours as needed.    [name] losartan (COZAAR) 100 MG tablet Take 1 tablet (100 mg total) by mouth daily. 90 tablet 2  . ONE TOUCH LANCETS MISC Use to test blood sugars. Dx:E11.9 100 each 11  . tamsulosin (FLOMAX) 0.4 MG CAPS capsule Take 1 capsule (0.4 mg total) by mouth daily. 14 capsule 0  . tamsulosin (FLOMAX) 0.4 MG CAPS capsule TAKE 1 CAPSULE BY MOUTH  DAILY 90 capsule 3  . TRAVATAN Z 0.004 % SOLN ophthalmic solution Place 1 drop into the left eye at bedtime.     . vitamin B-12 (CYANOCOBALAMIN) 1000 MCG tablet Take 1,000 mcg by mouth daily.     No current facility-administered medications for this visit.     Objective: BP 126/78 (BP Location: Left Arm, Patient Position: Sitting, Cuff Size: Normal)   Pulse 62   Temp 97.7 F (36.5 C) (Oral)   Ht 5\' 10"  (1.778 m)   Wt 253 lb 9.6 oz (115 kg)   SpO2 95%   BMI 36.39 kg/m  Gen: NAD, resting comfortably CV: RRR no murmurs rubs or gallops Lungs: CTAB no crackles, wheeze, rhonchi Abdomen: soft/nontender/nondistended/normal bowel sounds. No rebound or guarding.  Ext: trace edema Skin: warm, dry  Assessment/Plan:  Other notes: 1.CLL follows with Dr. Alen Blew. Update CBC  S: poorly controlled on last check. We started atorvastatin  10mg  once a week last visit Lab Results  Component Value Date   CHOL 206 (H) 07/24/2017   HDL 52.60 07/24/2017   LDLCALC 120 (H) 07/24/2017   LDLDIRECT 128.4 08/05/2013   TRIG 170.0 (H) 07/24/2017   CHOLHDL 4 07/24/2017   A/P: update LDL today- hopeful for even modest LDL improvement   Gout S: 0 flares in 4 months on no rx- has been off allopurinol for a year.  Lab Results  Component Value Date   LABURIC 8.9 (H) 07/24/2017  A/P: will continue to monitor- would need to restart allopurinol in future if starts having flare ups  Essential hypertension S: controlled on losartan 50mg  (half of 100mg  pill) BP Readings from Last 3 Encounters:  11/23/17 126/78  11/07/17 130/68   07/24/17 130/70  A/P: We discussed blood pressure goal of <140/90. No change  CKD (chronic kidney disease), stage III S:  Known CKD. Luckily has been able to stay off Mobic A/P: BP at goal, trying to control lipids better- avoid nsaids.   Diabetes mellitus type II, controlled S:  controlled on glipizide 5mg  24 hour Exercise and diet-  keeping up his biking and some hand machines outside of having grandkids over for the summer Lab Results  Component Value Date   HGBA1C 6.5 (A) 11/23/2017   HGBA1C 6.6 (H) 07/24/2017   HGBA1C 6.0 01/24/2017   A/P:  Continue current rx- ok to stretch back to 6 month visits   Future Appointments  Date Time Provider [place]  02/08/2018  9:00 AM [facility] ONLY [facility] None  02/08/2018  9:30 AM Wyatt Portela, MD [facility] None  11/13/2018  2:00 PM [facility]   Return in about 8 months (around 07/25/2018) for physical.  Lab/Order associations: Controlled type 2 diabetes mellitus with stage 2 chronic kidney disease, without long-term current use of insulin ([place]) - Plan: POCT glycosylated hemoglobin (Hb A1C), Comprehensive metabolic panel, CBC with Differential/Platelet, LDL cholesterol, direct  Idiopathic gout of multiple sites, unspecified chronicity  Essential hypertension  CKD (chronic kidney disease), stage III ([place])  Return precautions advised.  Garret Reddish, MD

## 2017-11-23 NOTE — Assessment & Plan Note (Signed)
S:  controlled on glipizide 5mg  24 hour Exercise and diet-  keeping up his biking and some hand machines outside of having grandkids over for the summer Lab Results  Component Value Date   HGBA1C 6.5 (A) 11/23/2017   HGBA1C 6.6 (H) 07/24/2017   HGBA1C 6.0 01/24/2017   A/P:  Continue current rx- ok to stretch back to 6 month visits

## 2017-11-23 NOTE — Assessment & Plan Note (Signed)
S:  Known CKD. Luckily has been able to stay off Mobic A/P: BP at goal, trying to control lipids better- avoid nsaids.

## 2018-01-24 ENCOUNTER — Telehealth: Payer: Self-pay | Admitting: Oncology

## 2018-01-24 NOTE — Telephone Encounter (Signed)
FS PAL - moved appointments from 10/10 to 10/24. Spoke with patient.

## 2018-01-25 ENCOUNTER — Ambulatory Visit: Payer: Medicare Other | Admitting: Family Medicine

## 2018-01-25 DIAGNOSIS — H2513 Age-related nuclear cataract, bilateral: Secondary | ICD-10-CM | POA: Diagnosis not present

## 2018-01-25 DIAGNOSIS — H25013 Cortical age-related cataract, bilateral: Secondary | ICD-10-CM | POA: Diagnosis not present

## 2018-01-25 DIAGNOSIS — H401131 Primary open-angle glaucoma, bilateral, mild stage: Secondary | ICD-10-CM | POA: Diagnosis not present

## 2018-01-29 ENCOUNTER — Ambulatory Visit (INDEPENDENT_AMBULATORY_CARE_PROVIDER_SITE_OTHER): Payer: Medicare Other

## 2018-01-29 ENCOUNTER — Encounter: Payer: Self-pay | Admitting: Family Medicine

## 2018-01-29 DIAGNOSIS — Z23 Encounter for immunization: Secondary | ICD-10-CM | POA: Diagnosis not present

## 2018-02-08 ENCOUNTER — Other Ambulatory Visit: Payer: Medicare Other

## 2018-02-08 ENCOUNTER — Ambulatory Visit: Payer: Medicare Other | Admitting: Oncology

## 2018-02-08 DIAGNOSIS — M961 Postlaminectomy syndrome, not elsewhere classified: Secondary | ICD-10-CM | POA: Diagnosis not present

## 2018-02-08 DIAGNOSIS — Z79899 Other long term (current) drug therapy: Secondary | ICD-10-CM | POA: Diagnosis not present

## 2018-02-08 DIAGNOSIS — M5416 Radiculopathy, lumbar region: Secondary | ICD-10-CM | POA: Diagnosis not present

## 2018-02-08 DIAGNOSIS — M461 Sacroiliitis, not elsewhere classified: Secondary | ICD-10-CM | POA: Diagnosis not present

## 2018-02-13 ENCOUNTER — Other Ambulatory Visit: Payer: Self-pay | Admitting: Family Medicine

## 2018-02-22 ENCOUNTER — Inpatient Hospital Stay (HOSPITAL_BASED_OUTPATIENT_CLINIC_OR_DEPARTMENT_OTHER): Payer: Medicare Other | Admitting: Oncology

## 2018-02-22 ENCOUNTER — Telehealth: Payer: Self-pay | Admitting: Oncology

## 2018-02-22 ENCOUNTER — Inpatient Hospital Stay: Payer: Medicare Other | Attending: Oncology

## 2018-02-22 VITALS — BP 131/66 | HR 74 | Temp 98.3°F | Resp 17 | Ht 70.0 in | Wt 252.1 lb

## 2018-02-22 DIAGNOSIS — C9110 Chronic lymphocytic leukemia of B-cell type not having achieved remission: Secondary | ICD-10-CM

## 2018-02-22 LAB — CBC WITH DIFFERENTIAL/PLATELET
Band Neutrophils: 0 %
Basophils Absolute: 0 10*3/uL (ref 0.0–0.1)
Basophils Relative: 0 %
Eosinophils Absolute: 0 10*3/uL (ref 0.0–0.5)
Eosinophils Relative: 0 %
HCT: 40.9 % (ref 39.0–52.0)
Hemoglobin: 13.2 g/dL (ref 13.0–17.0)
Lymphocytes Relative: 76 %
Lymphs Abs: 14.2 10*3/uL — ABNORMAL HIGH (ref 0.7–4.0)
MCH: 29.2 pg (ref 26.0–34.0)
MCHC: 32.3 g/dL (ref 30.0–36.0)
MCV: 90.5 fL (ref 80.0–100.0)
Monocytes Absolute: 0 10*3/uL — ABNORMAL LOW (ref 0.1–1.0)
Monocytes Relative: 0 %
Neutro Abs: 4.5 10*3/uL (ref 1.7–7.7)
Neutrophils Relative %: 24 %
Platelets: 140 10*3/uL — ABNORMAL LOW (ref 150–400)
RBC: 4.52 MIL/uL (ref 4.22–5.81)
RDW: 13.5 % (ref 11.5–15.5)
WBC: 18.7 10*3/uL — ABNORMAL HIGH (ref 4.0–10.5)
nRBC: 0 % (ref 0.0–0.2)

## 2018-02-22 NOTE — Telephone Encounter (Signed)
Scheduled appt per 10/24 los - gave patient AVS and calender per los . 

## 2018-02-22 NOTE — Progress Notes (Signed)
[facility] PROGRESS NOTE 02/22/18   DIAGNOSIS: 84 year old man CLL confirmed in 2018.  He presented with lymphocytosis dating back to 2007.   CURRENT THERAPY: Active surveillance.  INTERVAL HISTORY:  Kevin Rogers presents today for a follow-up visit.  Since last visit, he reports no major changes or complaints.  He remains in good health without any deterioration in his performance status or quality of life.  He does ambulate with the help of a walker without any falls or tripping.  He denies any lymphadenopathy, constitutional symptoms or weight loss.  His appetite and performance status is unchanged.  He denies any headaches, blurry vision, syncope or seizures.  He denies any alteration mental status or confusion.  He denies any fevers or chills or early satiety.  He does not report any chest pain, palpitation, orthopnea or leg edema. Does not report any cough, wheezing or hemoptysis. He does not report any abdominal pain, nausea, vomiting.  He denies any changes in bowel habits. He does not report any dysuria, urinary frequency or hematuria.he denies any bone pain or pathological fractures.  He denies any bleeding clotting tendency.  He denies any ecchymosis petechiae.  The remainder review of systems is negative.  MEDICAL HISTORY: Past Medical History:  Diagnosis Date  . Acute bronchitis 09/12/2007   Qualifier: Diagnosis of  By: [name] MD, [name]   . Diabetes mellitus type 2 with complications ([place]) 0349  . Glaucoma    left eye  . Gout   . Hyperlipidemia   . Hypertension   . Leukemia, chronic ([place]) 2001   does not take treatment just has blood levels checked once a year    IALLERGIES:  has No Known Allergies.  MEDICATIONS: has a current medication list which includes the following prescription(s): acetaminophen, atorvastatin, finasteride, glipizide, glucose blood, hydrocodone-acetaminophen, losartan, one touch lancets, tamsulosin, tamsulosin, travatan  z, and vitamin b-12.   PHYSICAL EXAMINATION: ECOG PERFORMANCE STATUS: 1  Blood pressure 131/66, pulse 74, temperature 98.3 F (36.8 C), temperature source Oral, resp. rate 17, height 5\' 10"  (1.778 m), weight 252 lb 1.6 oz (114.4 kg), SpO2 98 %.   General appearance: Comfortable appearing without any discomfort Head: Normocephalic without any trauma Oropharynx: Mucous membranes are moist and pink without any thrush or ulcers. Eyes: Pupils are equal and round reactive to light. Lymph nodes: No cervical, supraclavicular, inguinal or axillary lymphadenopathy.   Heart:regular rate and rhythm.  S1 and S2 without leg edema. Lung: Clear without any rhonchi or wheezes.  No dullness to percussion. Abdomin: Soft, nontender, nondistended with good bowel sounds.  No hepatosplenomegaly. Musculoskeletal: No joint deformity or effusion.  Full range of motion noted. Neurological: No deficits noted on motor, sensory and deep tendon reflex exam. Skin: No petechial rash or dryness.  Appeared moist.   CBC    Component Value Date/Time   WBC 14.9 (H) 11/23/2017 0919   RBC 4.35 11/23/2017 0919   HGB 12.8 (L) 11/23/2017 0919   HGB 13.3 02/09/2017 0851   HCT 39.3 11/23/2017 0919   HCT 39.8 02/09/2017 0851   PLT 156.0 11/23/2017 0919   PLT 144 02/09/2017 0851   MCV 90.4 11/23/2017 0919   MCV 89.4 02/09/2017 0851   MCH 29.9 02/09/2017 0851   MCH 27.8 05/05/2015 1614   MCHC 32.6 11/23/2017 0919   RDW 14.2 11/23/2017 0919   RDW 13.5 02/09/2017 0851   LYMPHSABS 11.0 (H) 11/23/2017 0919   LYMPHSABS 10.5 (H) 02/09/2017 0851   MONOABS 0.2 11/23/2017  0919   MONOABS 0.2 02/09/2017 0851   EOSABS 0.1 11/23/2017 0919   EOSABS 0.1 02/09/2017 0851   BASOSABS 0.0 11/23/2017 0919   BASOSABS 0.0 02/09/2017 0851       ASSESSMENT: Kevin Rogers 84 y.o. male with:   1.  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   The natural course of this disease was reviewed today with the patient and indication for treatment were discussed.  These indication would include rapid increase in his lymphocyte count, constitutional symptoms, painful adenopathy or cytopenias.  At this time, I see no indication for treatment I have recommended continued active surveillance.  Laboratory data from July 2019 were reviewed and showed his lymphocytosis has not changed at this time.  His labs from today are currently pending and will continue to monitor them annually.  2.  Cytopenias: No evidence of thrombocytopenia or anemia.  He is at risk of developing autoimmune cytopenias and we will continue to monitor him periodically.  3. Follow-up: Will be in 1 year.  15  minutes was spent with the patient face-to-face today.  More than 50% of time was dedicated to reviewing the natural course of his disease, laboratory data and coordinating future plan of care.   Zola Button MD 02/22/18

## 2018-02-26 ENCOUNTER — Emergency Department
Admission: EM | Admit: 2018-02-26 | Discharge: 2018-02-27 | Disposition: A | Discharge status: Home / Self Care | Payer: Medicare Other | Attending: Emergency Medicine | Admitting: Emergency Medicine

## 2018-02-26 ENCOUNTER — Encounter: Payer: Self-pay | Admitting: Emergency Medicine

## 2018-02-26 ENCOUNTER — Other Ambulatory Visit: Payer: Self-pay

## 2018-02-26 DIAGNOSIS — Z7984 Long term (current) use of oral hypoglycemic drugs: Secondary | ICD-10-CM | POA: Diagnosis not present

## 2018-02-26 DIAGNOSIS — E785 Hyperlipidemia, unspecified: Secondary | ICD-10-CM | POA: Diagnosis not present

## 2018-02-26 DIAGNOSIS — N401 Enlarged prostate with lower urinary tract symptoms: Secondary | ICD-10-CM | POA: Diagnosis not present

## 2018-02-26 DIAGNOSIS — N12 Tubulo-interstitial nephritis, not specified as acute or chronic: Secondary | ICD-10-CM | POA: Diagnosis not present

## 2018-02-26 DIAGNOSIS — Z79899 Other long term (current) drug therapy: Secondary | ICD-10-CM | POA: Insufficient documentation

## 2018-02-26 DIAGNOSIS — N183 Chronic kidney disease, stage 3 (moderate): Secondary | ICD-10-CM | POA: Diagnosis not present

## 2018-02-26 DIAGNOSIS — E1122 Type 2 diabetes mellitus with diabetic chronic kidney disease: Secondary | ICD-10-CM | POA: Diagnosis not present

## 2018-02-26 DIAGNOSIS — I129 Hypertensive chronic kidney disease with stage 1 through stage 4 chronic kidney disease, or unspecified chronic kidney disease: Secondary | ICD-10-CM | POA: Diagnosis not present

## 2018-02-26 DIAGNOSIS — N10 Acute tubulo-interstitial nephritis: Secondary | ICD-10-CM | POA: Diagnosis not present

## 2018-02-26 DIAGNOSIS — Z87891 Personal history of nicotine dependence: Secondary | ICD-10-CM | POA: Diagnosis not present

## 2018-02-26 DIAGNOSIS — R531 Weakness: Secondary | ICD-10-CM | POA: Diagnosis present

## 2018-02-26 DIAGNOSIS — R3911 Hesitancy of micturition: Secondary | ICD-10-CM

## 2018-02-26 DIAGNOSIS — I440 Atrioventricular block, first degree: Secondary | ICD-10-CM | POA: Diagnosis not present

## 2018-02-26 LAB — LACTIC ACID, PLASMA: Lactic Acid, Venous: 1.8 mmol/L (ref 0.5–1.9)

## 2018-02-26 LAB — CBC WITH DIFFERENTIAL/PLATELET
Abs Immature Granulocytes: 0.1 10*3/uL — ABNORMAL HIGH (ref 0.00–0.07)
Basophils Absolute: 0 10*3/uL (ref 0.0–0.1)
Basophils Relative: 0 %
Eosinophils Absolute: 0 10*3/uL (ref 0.0–0.5)
Eosinophils Relative: 0 %
HCT: 39 % (ref 39.0–52.0)
Hemoglobin: 12.9 g/dL — ABNORMAL LOW (ref 13.0–17.0)
Immature Granulocytes: 1 %
Lymphocytes Relative: 58 %
Lymphs Abs: 11 10*3/uL — ABNORMAL HIGH (ref 0.7–4.0)
MCH: 29.9 pg (ref 26.0–34.0)
MCHC: 33.1 g/dL (ref 30.0–36.0)
MCV: 90.3 fL (ref 80.0–100.0)
Monocytes Absolute: 0.5 10*3/uL (ref 0.1–1.0)
Monocytes Relative: 2 %
Neutro Abs: 7.5 10*3/uL (ref 1.7–7.7)
Neutrophils Relative %: 39 %
Platelets: 136 10*3/uL — ABNORMAL LOW (ref 150–400)
RBC: 4.32 MIL/uL (ref 4.22–5.81)
RDW: 13.3 % (ref 11.5–15.5)
Smear Review: NORMAL
WBC: 19.1 10*3/uL — ABNORMAL HIGH (ref 4.0–10.5)
nRBC: 0 % (ref 0.0–0.2)

## 2018-02-26 LAB — COMPREHENSIVE METABOLIC PANEL
ALT: 14 U/L (ref 0–44)
AST: 15 U/L (ref 15–41)
Albumin: 3.8 g/dL (ref 3.5–5.0)
Alkaline Phosphatase: 60 U/L (ref 38–126)
Anion gap: 7 (ref 5–15)
BUN: 25 mg/dL — ABNORMAL HIGH (ref 8–23)
CO2: 23 mmol/L (ref 22–32)
Calcium: 9.6 mg/dL (ref 8.9–10.3)
Chloride: 103 mmol/L (ref 98–111)
Creatinine, Ser: 1.73 mg/dL — ABNORMAL HIGH (ref 0.61–1.24)
GFR calc Af Amer: 40 mL/min — ABNORMAL LOW (ref >60)
GFR calc non Af Amer: 35 mL/min — ABNORMAL LOW (ref >60)
Glucose, Bld: 222 mg/dL — ABNORMAL HIGH (ref 70–99)
Potassium: 4.9 mmol/L (ref 3.5–5.1)
Sodium: 133 mmol/L — ABNORMAL LOW (ref 135–145)
Total Bilirubin: 2.2 mg/dL — ABNORMAL HIGH (ref 0.3–1.2)
Total Protein: 6.7 g/dL (ref 6.5–8.1)

## 2018-02-26 LAB — URINALYSIS, COMPLETE (UACMP) WITH MICROSCOPIC
Bilirubin Urine: NEGATIVE
Glucose, UA: NEGATIVE mg/dL
Hgb urine dipstick: NEGATIVE
Ketones, ur: NEGATIVE mg/dL
Nitrite: NEGATIVE
Protein, ur: NEGATIVE mg/dL
Specific Gravity, Urine: 1.015 (ref 1.005–1.030)
WBC, UA: >50 WBC/hpf — ABNORMAL HIGH (ref 0–5)
pH: 5 (ref 5.0–8.0)

## 2018-02-26 LAB — TROPONIN I: Troponin I: <0.03 ng/mL (ref <0.03)

## 2018-02-26 NOTE — ED Triage Notes (Signed)
Pt to triage via w/c with no distress noted; reports weakness & fever since yesterday

## 2018-02-26 NOTE — ED Notes (Signed)
Pt reports intermittent fevers since yesterday with weakness.  No chest pain or sob.  No n/v/d.  Pt has left shoulder/upper arm pain.  No known injury.  Pt alert  Speech clear.

## 2018-02-27 LAB — PATHOLOGIST SMEAR REVIEW

## 2018-02-27 MED ORDER — CEPHALEXIN 500 MG PO CAPS: 500.0000 mg | ORAL_CAPSULE | Freq: Four times a day (QID) | ORAL | 0 refills | Status: AC

## 2018-02-27 MED ORDER — SODIUM CHLORIDE 0.9 % IV SOLN
2.0000 g | Freq: Once | INTRAVENOUS | Status: AC
  Administered 2018-02-27: 2 g via INTRAVENOUS
  Filled 2018-02-27: qty 20

## 2018-02-27 MED ORDER — SODIUM CHLORIDE 0.9 % IV BOLUS
2000.0000 mL | Freq: Once | INTRAVENOUS | Status: AC
  Administered 2018-02-27: 2000 mL via INTRAVENOUS

## 2018-02-27 NOTE — Discharge Instructions (Signed)
It was a pleasure to take care of you today, and thank you for coming to our emergency department.  If you have any questions or concerns before leaving please ask the nurse to grab me and I'm more than happy to go through your aftercare instructions again.  If you were prescribed any opioid pain medication today such as Norco, Vicodin, Percocet, morphine, hydrocodone, or oxycodone please make sure you do not drive when you are taking this medication as it can alter your ability to drive safely.  If you have any concerns once you are home that you are not improving or are in fact getting worse before you can make it to your follow-up appointment, please do not hesitate to call 911 and come back for further evaluation.  Darel Hong, MD  Results for orders placed or performed during the hospital encounter of 02/26/18  CBC with Differential  Result Value Ref Range   WBC 19.1 (H) 4.0 - 10.5 K/uL   RBC 4.32 4.22 - 5.81 MIL/uL   Hemoglobin 12.9 (L) 13.0 - 17.0 g/dL   HCT 39.0 39.0 - 52.0 %   MCV 90.3 80.0 - 100.0 fL   MCH 29.9 26.0 - 34.0 pg   MCHC 33.1 30.0 - 36.0 g/dL   RDW 13.3 11.5 - 15.5 %   Platelets 136 (L) 150 - 400 K/uL   nRBC 0.0 0.0 - 0.2 %   Neutrophils Relative % 39 %   Neutro Abs 7.5 1.7 - 7.7 K/uL   Lymphocytes Relative 58 %   Lymphs Abs 11.0 (H) 0.7 - 4.0 K/uL   Monocytes Relative 2 %   Monocytes Absolute 0.5 0.1 - 1.0 K/uL   Eosinophils Relative 0 %   Eosinophils Absolute 0.0 0.0 - 0.5 K/uL   Basophils Relative 0 %   Basophils Absolute 0.0 0.0 - 0.1 K/uL   WBC Morphology HIDE    RBC Morphology MORPHOLOGY UNREMARKABLE    Smear Review Normal platelet morphology    Immature Granulocytes 1 %   Abs Immature Granulocytes 0.10 (H) 0.00 - 0.07 K/uL   Abnormal Lymphocytes Present PRESENT    Large Granular Lymphocytes PRESENT    Reactive, Benign Lymphocytes PRESENT   Comprehensive metabolic panel  Result Value Ref Range   Sodium 133 (L) 135 - 145 mmol/L   Potassium 4.9  3.5 - 5.1 mmol/L   Chloride 103 98 - 111 mmol/L   CO2 23 22 - 32 mmol/L   Glucose, Bld 222 (H) 70 - 99 mg/dL   BUN 25 (H) 8 - 23 mg/dL   Creatinine, Ser 1.73 (H) 0.61 - 1.24 mg/dL   Calcium 9.6 8.9 - 10.3 mg/dL   Total Protein 6.7 6.5 - 8.1 g/dL   Albumin 3.8 3.5 - 5.0 g/dL   AST 15 15 - 41 U/L   ALT 14 0 - 44 U/L   Alkaline Phosphatase 60 38 - 126 U/L   Total Bilirubin 2.2 (H) 0.3 - 1.2 mg/dL   GFR calc non Af Amer 35 (L) >60 mL/min   GFR calc Af Amer 40 (L) >60 mL/min   Anion gap 7 5 - 15  Troponin I  Result Value Ref Range   Troponin I <0.03 <0.03 ng/mL  Urinalysis, Complete w Microscopic  Result Value Ref Range   Color, Urine YELLOW (A) YELLOW   APPearance CLOUDY (A) CLEAR   Specific Gravity, Urine 1.015 1.005 - 1.030   pH 5.0 5.0 - 8.0   Glucose, UA NEGATIVE NEGATIVE mg/dL  Hgb urine dipstick NEGATIVE NEGATIVE   Bilirubin Urine NEGATIVE NEGATIVE   Ketones, ur NEGATIVE NEGATIVE mg/dL   Protein, ur NEGATIVE NEGATIVE mg/dL   Nitrite NEGATIVE NEGATIVE   Leukocytes, UA LARGE (A) NEGATIVE   RBC / HPF 6-10 0 - 5 RBC/hpf   WBC, UA >50 (H) 0 - 5 WBC/hpf   Bacteria, UA FEW (A) NONE SEEN   Squamous Epithelial / LPF 0-5 0 - 5   WBC Clumps PRESENT    Mucus PRESENT   Lactic acid, plasma  Result Value Ref Range   Lactic Acid, Venous 1.8 0.5 - 1.9 mmol/L

## 2018-02-27 NOTE — ED Notes (Signed)
ED Provider at bedside. 

## 2018-02-27 NOTE — ED Notes (Signed)
Iv started  meds infusing

## 2018-02-27 NOTE — ED Provider Notes (Signed)
[facility] Emergency Department Provider Note  ____________________________________________   First MD Initiated Contact with Patient 02/26/18 2253     (approximate)  I have reviewed the triage vital signs and the nursing notes.   HISTORY  Chief Complaint Weakness   HPI Kevin Rogers is a 84 y.o. male who presents to the emergency department with myalgia fevers and chills that began yesterday along with diffuse weakness.  No chest pain, shortness of breath, abdominal pain nausea or vomiting.  He does note frequent urination and a foul smell to his urine.  No cough.  No sore throat.  No ear tugging.  Symptoms came on gradually are moderate to severe or improved with antipyretics nothing seems to make them worse.  He does have a long-standing history of BPH for which she takes both Flomax and finasteride.    Past Medical History:  Diagnosis Date  . Acute bronchitis 09/12/2007   Qualifier: Diagnosis of  By: [name] MD, [name]   . Diabetes mellitus type 2 with complications ([place]) 9169  . Glaucoma    left eye  . Gout   . Hyperlipidemia   . Hypertension   . Leukemia, chronic ([place]) 2001   does not take treatment just has blood levels checked once a year    Patient Active Problem List   Diagnosis Date Noted  . Enteritis due to Clostridium difficile   . Discitis of lumbar region   . History of osteomyelitis of lumbar vertebrae   . Hyperlipidemia   . CKD (chronic kidney disease), stage III ([place]) 01/13/2015  . Lumbar spinal stenosis 06/26/2014  . BPH (benign prostatic hyperplasia) 06/24/2014  . B12 deficiency 12/12/2008  . Diabetic polyneuropathy ([place]) 12/12/2008  . ERECTILE DYSFUNCTION 03/18/2008  . Chronic lymphocytic leukemia ([place]) 03/08/2007  . Diabetes mellitus type II, controlled ([place]) 03/08/2007  . Gout 03/08/2007  . Glaucoma 03/08/2007  . Essential hypertension 03/08/2007  . Osteoarthritis 03/08/2007    Past Surgical History:  Procedure  Laterality Date  . ANTERIOR LAT LUMBAR FUSION Left 01/03/2013   Procedure: ANTERIOR LATERAL LUMBAR FUSION lumbar three-four;  Surgeon: [name], MD;  Location: [place] [facility];  Service: Neurosurgery;  Laterality: Left;  . BACK SURGERY     lumbar fusion - 2016, feb  . COLONOSCOPY W/ POLYPECTOMY    . DENTAL SURGERY     implanted teeth x 2  . HERNIA REPAIR  1989  . LUMBAR LAMINECTOMY  1997  . LUMBAR PERCUTANEOUS PEDICLE SCREW 1 LEVEL  01/03/2013   Procedure: LUMBAR PERCUTANEOUS PEDICLE SCREWS LUMBAR THREE-FOUR;  Surgeon: [name], MD;  Location: [place] [facility];  Service: Neurosurgery;;  . LUMBAR WOUND DEBRIDEMENT N/A 07/23/2014   Procedure: LUMBAR WOUND DEBRIDEMENT;  Surgeon: [name], MD;  Location: [place] [facility];  Service: Neurosurgery;  Laterality: N/A;  . TONSILLECTOMY      Prior to Admission medications   Medication Sig Start Date End Date Taking? Authorizing Provider  acetaminophen (TYLENOL) 500 MG tablet Take 500-1,000 mg by mouth every 6 (six) hours as needed (pain). Reported on 07/15/2015    [provider]  atorvastatin (LIPITOR) 10 MG tablet Take 1 tablet (10 mg total) by mouth once a week. 07/24/17   [name], MD  cephALEXin (KEFLEX) 500 MG capsule Take 1 capsule (500 mg total) by mouth 4 (four) times daily for 10 days. 02/27/18 03/09/18  Darel Hong, MD  finasteride (PROSCAR) 5 MG tablet Take 1 tablet (5 mg total) by mouth daily.  07/06/17   [name], MD  glipiZIDE (GLUCOTROL XL) 5 MG 24 hr tablet TAKE 1 TABLET BY MOUTH EVERY DAY 02/13/18   [name], MD  glucose blood (ONETOUCH VERIO) test strip Use to test blood sugars daily. Dx: E11.9 08/18/14   [name], MD  HYDROcodone-acetaminophen (NORCO/VICODIN) 5-325 MG tablet Take 1-2 tablets by mouth every 6 (six) hours as needed. 07/21/17   [provider]  losartan (COZAAR) 100 MG tablet Take 1 tablet (100 mg total) by mouth daily. 03/01/17   [name], MD  ONE TOUCH  LANCETS MISC Use to test blood sugars. Dx:E11.9 08/18/14   [name], MD  tamsulosin (FLOMAX) 0.4 MG CAPS capsule Take 1 capsule (0.4 mg total) by mouth daily. 09/12/17   [name], MD  tamsulosin (FLOMAX) 0.4 MG CAPS capsule TAKE 1 CAPSULE BY MOUTH  DAILY 10/09/17   [name], MD  TRAVATAN Z 0.004 % SOLN ophthalmic solution Place 1 drop into the left eye at bedtime.  09/03/14   [provider]  vitamin B-12 (CYANOCOBALAMIN) 1000 MCG tablet Take 1,000 mcg by mouth daily.    [provider]    Allergies Patient has no known allergies.  Family History  Problem Relation Age of Onset  . Heart disease Mother        25, second hand  . Heart disease Father        18, former smoker    Social History Social History   Tobacco Use  . Smoking status: Former Smoker    Packs/day: 0.25    Years: 20.00    Pack years: 5.00    Types: Cigarettes    Last attempt to quit: 09/28/1995    Years since quitting: 22.4  . Smokeless tobacco: Never Used  Substance Use Topics  . Alcohol use: Yes    Alcohol/week: 1.0 standard drinks    Types: 1 Glasses of wine per week    Comment: rarely  . Drug use: No    Review of Systems Constitutional: Positive for fevers and chills Eyes: No visual changes. ENT: No sore throat. Cardiovascular: Denies chest pain. Respiratory: Denies shortness of breath. Gastrointestinal: No abdominal pain.  No nausea, no vomiting.  No diarrhea.  No constipation. Genitourinary: Positive for dysuria. Musculoskeletal: Negative for back pain. Skin: Negative for rash. Neurological: Negative for headaches, focal weakness or numbness.   ____________________________________________   PHYSICAL EXAM:  VITAL SIGNS: ED Triage Vitals  Enc Vitals Group     BP 02/26/18 1947 (!) 130/93     Pulse Rate 02/26/18 1947 92     Resp 02/26/18 1947 20     Temp 02/26/18 1947 98.9 F (37.2 C)     Temp Source 02/26/18 1947 Oral     SpO2 02/26/18 1947 99 %       Weight 02/26/18 1946 252 lb 3.3 oz (114.4 kg)     Height 02/26/18 1946 5\' 10"  (1.778 m)     Head Circumference --      Peak Flow --      Pain Score 02/26/18 1946 0     Pain Loc --      Pain Edu? --      Excl. in [place]? --     Constitutional: Alert and oriented x4 pleasant cooperative speaks in full clear sentences no diaphoresis Eyes: PERRL EOMI. Head: Atraumatic. Nose: No congestion/rhinnorhea. Mouth/Throat: No trismus Neck: No stridor.   Cardiovascular: Normal rate, regular rhythm. Grossly normal heart  sounds.  Good peripheral circulation. Respiratory: Normal respiratory effort.  No retractions. Lungs CTAB and moving good air Gastrointestinal: Soft nondistended nontender no rebound or guarding no peritonitis very minimal costovertebral tenderness bilaterally Musculoskeletal: No lower extremity edema   Neurologic:  Normal speech and language. No gross focal neurologic deficits are appreciated. Skin:  Skin is warm, dry and intact. No rash noted. Psychiatric: Mood and affect are normal. Speech and behavior are normal.    ____________________________________________   DIFFERENTIAL includes but not limited to  Influenza, pyelonephritis, bacteremia, sepsis, pneumonia ____________________________________________   LABS (all labs ordered are listed, but only abnormal results are displayed)  Labs Reviewed  URINE CULTURE - Abnormal; Notable for the following components:      Result Value   Culture >=100,000 COLONIES/mL ENTEROBACTER AEROGENES (*)    Organism ID, Bacteria ENTEROBACTER AEROGENES (*)    All other components within normal limits  CBC WITH DIFFERENTIAL/PLATELET - Abnormal; Notable for the following components:   WBC 19.1 (*)    Hemoglobin 12.9 (*)    Platelets 136 (*)    Lymphs Abs 11.0 (*)    Abs Immature Granulocytes 0.10 (*)    All other components within normal limits  COMPREHENSIVE METABOLIC PANEL - Abnormal; Notable for the following components:   Sodium  133 (*)    Glucose, Bld 222 (*)    BUN 25 (*)    Creatinine, Ser 1.73 (*)    Total Bilirubin 2.2 (*)    GFR calc non Af Amer 35 (*)    GFR calc Af Amer 40 (*)    All other components within normal limits  URINALYSIS, COMPLETE (UACMP) WITH MICROSCOPIC - Abnormal; Notable for the following components:   Color, Urine YELLOW (*)    APPearance CLOUDY (*)    Leukocytes, UA LARGE (*)    WBC, UA >50 (*)    Bacteria, UA FEW (*)    All other components within normal limits  TROPONIN I  LACTIC ACID, PLASMA  PATHOLOGIST SMEAR REVIEW    Lab work reviewed by me with elevated white count concerning for infection.  Urinalysis is consistent with active infection Slight decrease in GFR consistent with dehydration __________________________________________  EKG  ED ECG REPORT I, Darel Hong, the attending physician, personally viewed and interpreted this ECG.  Date: 02/26/2018 EKG Time:  Rate: 93 Rhythm: normal sinus rhythm QRS Axis: Left axis Intervals: First-degree AV block ST/T Wave abnormalities: normal Narrative Interpretation: no evidence of acute ischemia  ____________________________________________  RADIOLOGY   ____________________________________________   PROCEDURES  Procedure(s) performed: no  Procedures  Critical Care performed: no  ____________________________________________   INITIAL IMPRESSION / ASSESSMENT AND PLAN / ED COURSE  Pertinent labs & imaging results that were available during my care of the patient were reviewed by me and considered in my medical decision making (see chart for details).   As part of my medical decision making, I reviewed the following data within the [place] History obtained from family if available, nursing notes, old chart and ekg, as well as notes from prior ED visits.  Patient comes to the emergency department febrile with diffuse myalgias.  His exam is actually relatively unremarkable.  No viral  symptoms and no suggestion of influenza.  Urinalysis obtained which is consistent with infection.  He has very minimal costovertebral tenderness however a UTI should not cause this degree of fever so I think is reasonable to consider him pyelonephritis.  I reviewed previous urine cultures and he is never had  resistant bacteria.  Given 2 g of ceftriaxone here and 2 L of fluid and will observe for possible lysis and hypotension.     ----------------------------------------- 2:37 AM on 02/27/2018 -----------------------------------------  The patient is tolerated his antibiotics and fluids well.  As he is already on maximum prostate therapy with both finasteride and Flomax I will additionally refer him to urology to discuss possible TURP or other options to help prevent further episodes of sepsis in the future.  The patient is discharged home in improved condition verbalizes understanding agreement the plan with strict return precautions.  10 days of Keflex. ____________________________________________   FINAL CLINICAL IMPRESSION(S) / ED DIAGNOSES  Final diagnoses:  Pyelonephritis  Benign prostatic hyperplasia with urinary hesitancy      NEW MEDICATIONS STARTED DURING THIS VISIT:  Discharge Medication List as of 02/27/2018  2:37 AM    START taking these medications   Details  cephALEXin (KEFLEX) 500 MG capsule Take 1 capsule (500 mg total) by mouth 4 (four) times daily for 10 days., Starting Tue 02/27/2018, Until Fri 03/09/2018, Print         Note:  This document was prepared using Dragon voice recognition software and may include unintentional dictation errors.     Darel Hong, MD 03/01/18 2128

## 2018-02-28 DIAGNOSIS — M7542 Impingement syndrome of left shoulder: Secondary | ICD-10-CM | POA: Diagnosis not present

## 2018-02-28 DIAGNOSIS — M7541 Impingement syndrome of right shoulder: Secondary | ICD-10-CM | POA: Diagnosis not present

## 2018-03-01 LAB — URINE CULTURE: Culture: >=100000 — AB

## 2018-03-05 NOTE — Progress Notes (Signed)
ED Antimicrobial Stewardship Positive Culture Follow Up   Kevin Rogers is an 84 y.o. male who presented to [facility] on 02/26/2018 with a chief complaint of     Chief Complaint  Patient presents with  . Weakness           Recent Results (from the past 720 hour(s))  Urine culture     Status: Abnormal   Collection Time: 02/26/18  7:57 PM  Result Value Ref Range Status   Specimen Description   Final    URINE, RANDOM Performed at [facility], [address] [place], [place] [address]    Special Requests   Final    NONE Performed at [facility], [address] [place], [place] [address]    Culture >=100,000 COLONIES/mL ENTEROBACTER AEROGENES (A)  Final   Report Status 03/01/2018 FINAL  Final   Organism ID, Bacteria ENTEROBACTER AEROGENES (A)  Final      Susceptibility   Enterobacter aerogenes - MIC*    CEFAZOLIN >=64 RESISTANT Resistant     CEFTRIAXONE <=1 SENSITIVE Sensitive     CIPROFLOXACIN <=0.25 SENSITIVE Sensitive     GENTAMICIN <=1 SENSITIVE Sensitive     IMIPENEM 0.5 SENSITIVE Sensitive     NITROFURANTOIN 64 INTERMEDIATE Intermediate     TRIMETH/SULFA <=20 SENSITIVE Sensitive     PIP/TAZO <=4 SENSITIVE Sensitive     * >=100,000 COLONIES/mL ENTEROBACTER AEROGENES    [x]  Treated with cephalexin, organism resistant to prescribed antimicrobial []  Patient discharged originally without antimicrobial agent and treatment is now indicated  New antibiotic prescription: Cipro 500mg  bid x 7 days ED Provider: Harvest Dark  - Spoke with patient on phone and counseled therapy change - Called in new therapy to [facility], PharmD Clinical Pharmacist 03/05/2018 4:05 PM

## 2018-03-12 ENCOUNTER — Other Ambulatory Visit: Payer: Self-pay | Admitting: Family Medicine

## 2018-03-15 ENCOUNTER — Ambulatory Visit (INDEPENDENT_AMBULATORY_CARE_PROVIDER_SITE_OTHER): Payer: Medicare Other | Admitting: Urology

## 2018-03-15 ENCOUNTER — Encounter: Payer: Self-pay | Admitting: Urology

## 2018-03-15 VITALS — BP 113/67 | HR 106 | Ht 72.0 in | Wt 254.0 lb

## 2018-03-15 DIAGNOSIS — R3911 Hesitancy of micturition: Secondary | ICD-10-CM

## 2018-03-15 DIAGNOSIS — N401 Enlarged prostate with lower urinary tract symptoms: Secondary | ICD-10-CM | POA: Diagnosis not present

## 2018-03-15 LAB — URINALYSIS, COMPLETE
Bilirubin, UA: NEGATIVE
Glucose, UA: NEGATIVE
Ketones, UA: NEGATIVE
Nitrite, UA: NEGATIVE
Protein, UA: NEGATIVE
RBC, UA: NEGATIVE
Specific Gravity, UA: 1.02 (ref 1.005–1.030)
Urobilinogen, Ur: 0.2 mg/dL (ref 0.2–1.0)
pH, UA: 5.5 (ref 5.0–7.5)

## 2018-03-15 LAB — MICROSCOPIC EXAMINATION: RBC, UA: NONE SEEN /hpf (ref 0–2)

## 2018-03-15 LAB — BLADDER SCAN AMB NON-IMAGING

## 2018-03-15 NOTE — Progress Notes (Signed)
03/15/2018 9:18 PM   Kevin Rogers 01-04-1932 [id number]  Referring provider: [name], MD [address] [place], [place] [address]  Chief Complaint  Patient presents with  . Hospitalization Follow-up    HPI: 84 year old male who presented to the ED on 02/27/2018 complaining of myalgia, fever, chills and weakness.  UA was positive for infection and urine culture grew Enterobacter aerogenes.  He received 2 g of ceftriaxone and was discharged on cephalexin.  The organism was resistant to Keflex and he was contacted and treated with a 7-day course of Cipro.  His symptoms resolved.  He has a history of BPH and is on tamsulosin and finasteride.  Urology follow-up was recommended by the ED to discuss possible TURP.  He has stable lower urinary tract symptoms on combination therapy.  IPSS completed today was 9/35 with a quality of life rated 3/6.  He denies previous history of UTI or urinary retention.   PMH: Past Medical History:  Diagnosis Date  . Acute bronchitis 09/12/2007   Qualifier: Diagnosis of  By: [name] MD, [name]   . Diabetes mellitus type 2 with complications ([place]) 1914  . Glaucoma    left eye  . Gout   . Hyperlipidemia   . Hypertension   . Leukemia, chronic ([place]) 2001   does not take treatment just has blood levels checked once a year    Surgical History: Past Surgical History:  Procedure Laterality Date  . ANTERIOR LAT LUMBAR FUSION Left 01/03/2013   Procedure: ANTERIOR LATERAL LUMBAR FUSION lumbar three-four;  Surgeon: [name], MD;  Location: [place] [facility];  Service: Neurosurgery;  Laterality: Left;  . BACK SURGERY     lumbar fusion - 2016, feb  . COLONOSCOPY W/ POLYPECTOMY    . DENTAL SURGERY     implanted teeth x 2  . HERNIA REPAIR  1989  . LUMBAR LAMINECTOMY  1997  . LUMBAR PERCUTANEOUS PEDICLE SCREW 1 LEVEL  01/03/2013   Procedure: LUMBAR PERCUTANEOUS PEDICLE SCREWS LUMBAR THREE-FOUR;  Surgeon: [name], MD;  Location: [facility];   Service: Neurosurgery;;  . LUMBAR WOUND DEBRIDEMENT N/A 07/23/2014   Procedure: LUMBAR WOUND DEBRIDEMENT;  Surgeon: [name], MD;  Location: [facility];  Service: Neurosurgery;  Laterality: N/A;  . TONSILLECTOMY      Home Medications:  Allergies as of 03/15/2018   No Known Allergies     Medication List        Accurate as of 03/15/18  9:18 PM. Always use your most recent med list.          acetaminophen 500 MG tablet Commonly known as:  TYLENOL Take 500-1,000 mg by mouth every 6 (six) hours as needed (pain). Reported on 07/15/2015   atorvastatin 10 MG tablet Commonly known as:  LIPITOR Take 1 tablet (10 mg total) by mouth once a week.   finasteride 5 MG tablet Commonly known as:  PROSCAR Take 1 tablet (5 mg total) by mouth daily.   glipiZIDE 5 MG 24 hr tablet Commonly known as:  GLUCOTROL XL TAKE 1 TABLET BY MOUTH EVERY DAY   glucose blood test strip Use to test blood sugars daily. Dx: E11.9   HYDROcodone-acetaminophen 5-325 MG tablet Commonly known as:  NORCO/VICODIN Take 1-2 tablets by mouth every 6 (six) hours as needed.   losartan 100 MG tablet Commonly known as:  COZAAR TAKE 1 TABLET BY MOUTH  DAILY   ONE TOUCH LANCETS Misc Use to test blood sugars. Dx:E11.9  tamsulosin 0.4 MG Caps capsule Commonly known as:  FLOMAX Take 1 capsule (0.4 mg total) by mouth daily.   tamsulosin 0.4 MG Caps capsule Commonly known as:  FLOMAX TAKE 1 CAPSULE BY MOUTH  DAILY   TRAVATAN Z 0.004 % Soln ophthalmic solution Generic drug:  Travoprost (BAK Free) Place 1 drop into the left eye at bedtime.   vitamin B-12 1000 MCG tablet Commonly known as:  CYANOCOBALAMIN Take 1,000 mcg by mouth daily.       Allergies: No Known Allergies  Family History: Family History  Problem Relation Age of Onset  . Heart disease Mother        63, second hand  . Heart disease Father        20, former smoker    Social History:  reports that he quit smoking about 22 years ago.  His smoking use included cigarettes. He has a 5.00 pack-year smoking history. He has never used smokeless tobacco. He reports that he drinks about 1.0 standard drinks of alcohol per week. He reports that he does not use drugs.  ROS: UROLOGY Frequent Urination?: No Hard to postpone urination?: No Burning/pain with urination?: No Get up at night to urinate?: Yes Leakage of urine?: No Urine stream starts and stops?: No Trouble starting stream?: No Do you have to strain to urinate?: No Blood in urine?: No Urinary tract infection?: No Sexually transmitted disease?: No Injury to kidneys or bladder?: No Painful intercourse?: No Weak stream?: No Erection problems?: No Penile pain?: No  Gastrointestinal Nausea?: No Vomiting?: No Indigestion/heartburn?: No Diarrhea?: No Constipation?: No  Constitutional Fever: No Night sweats?: No Weight loss?: No Fatigue?: No  Skin Skin rash/lesions?: No Itching?: No  Eyes Blurred vision?: No Double vision?: No  Ears/Nose/Throat Sore throat?: No Sinus problems?: No  Hematologic/Lymphatic Swollen glands?: No Easy bruising?: No  Cardiovascular Leg swelling?: No Chest pain?: No  Respiratory Cough?: No Shortness of breath?: No  Endocrine Excessive thirst?: No  Musculoskeletal Back pain?: Yes Joint pain?: Yes  Neurological Headaches?: No Dizziness?: No  Psychologic Depression?: No Anxiety?: No  Physical Exam: BP 113/67 (BP Location: Left Arm, Patient Position: Sitting, Cuff Size: Large)   Pulse (!) 106   Ht 6' (1.829 m)   Wt 254 lb (115.2 kg)   BMI 34.45 kg/m   Constitutional:  Alert and oriented, No acute distress. HEENT: [place] AT, moist mucus membranes.  Trachea midline, no masses. Cardiovascular: No clubbing, cyanosis, or edema. Respiratory: Normal respiratory effort, no increased work of breathing. GI: Abdomen is soft, nontender, nondistended, no abdominal masses GU: No CVA tenderness.  Prostate 40 g, smooth  without nodules. Lymph: No cervical or inguinal lymphadenopathy. Skin: No rashes, bruises or suspicious lesions. Neurologic: Grossly intact, no focal deficits, moving all 4 extremities. Psychiatric: Normal mood and affect.  Laboratory Data:  Urinalysis Dipstick 1+ leukocytes/microscopy 11-30 WBC.  Assessment & Plan:   84 year old male with a history of BPH on combination therapy with a recent febrile UTI consistent with acute prostatitis.  PVR by bladder scan today was 49 mL.  He is satisfied on medical management.  I did discuss surgical options including TURP/PVP and minimally invasive options including UroLift.  He would like to continue medical management at this time.  Follow-up urinalysis today did show pyuria and a urine culture was ordered.  Return in about 6 months (around 09/13/2018) for BPH, IPSS, PVR .  Abbie Sons, [place] [address], [place], [place] [address] [phone]  [phone]  

## 2018-03-16 ENCOUNTER — Other Ambulatory Visit: Payer: Medicare Other

## 2018-03-16 ENCOUNTER — Other Ambulatory Visit: Payer: Self-pay | Admitting: Family Medicine

## 2018-03-16 DIAGNOSIS — R3911 Hesitancy of micturition: Principal | ICD-10-CM

## 2018-03-16 DIAGNOSIS — N401 Enlarged prostate with lower urinary tract symptoms: Secondary | ICD-10-CM

## 2018-03-18 LAB — CULTURE, URINE COMPREHENSIVE

## 2018-03-19 ENCOUNTER — Telehealth: Payer: Self-pay

## 2018-03-19 NOTE — Telephone Encounter (Signed)
-----   Message from Abbie Sons, MD sent at 03/19/2018  7:28 AM EST ----- Urine culture showed no evidence of infection

## 2018-04-12 DIAGNOSIS — M5416 Radiculopathy, lumbar region: Secondary | ICD-10-CM | POA: Diagnosis not present

## 2018-04-12 DIAGNOSIS — M48061 Spinal stenosis, lumbar region without neurogenic claudication: Secondary | ICD-10-CM | POA: Diagnosis not present

## 2018-04-12 DIAGNOSIS — M7542 Impingement syndrome of left shoulder: Secondary | ICD-10-CM | POA: Diagnosis not present

## 2018-04-12 DIAGNOSIS — M7541 Impingement syndrome of right shoulder: Secondary | ICD-10-CM | POA: Diagnosis not present

## 2018-04-12 DIAGNOSIS — M47814 Spondylosis without myelopathy or radiculopathy, thoracic region: Secondary | ICD-10-CM | POA: Diagnosis not present

## 2018-06-04 ENCOUNTER — Telehealth: Payer: Self-pay | Admitting: Family Medicine

## 2018-06-04 NOTE — Telephone Encounter (Signed)
See note  Copied from [place] [phone]. Topic: General - Inquiry >> Jun 04, 2018  1:49 PM Sheran Luz wrote: Reason for CRM: Patient called to inquire if a hearing test can be preformed in office during [place] with PCP or if he would need a referral. Please advise.

## 2018-06-04 NOTE — Telephone Encounter (Signed)
Called patient and let him know we can perform a very basic hearing test here in the office or we can refer him. He stated he is due for a physical next month and he will request the hearing test then. I told him if he changed his mind and would like a referral to please let us know. He verbalized understanding

## 2018-06-22 ENCOUNTER — Other Ambulatory Visit: Payer: Self-pay | Admitting: Family Medicine

## 2018-07-04 ENCOUNTER — Other Ambulatory Visit: Payer: Self-pay | Admitting: Family Medicine

## 2018-07-10 DIAGNOSIS — M5416 Radiculopathy, lumbar region: Secondary | ICD-10-CM | POA: Diagnosis not present

## 2018-07-10 DIAGNOSIS — Z79899 Other long term (current) drug therapy: Secondary | ICD-10-CM | POA: Diagnosis not present

## 2018-07-10 DIAGNOSIS — M461 Sacroiliitis, not elsewhere classified: Secondary | ICD-10-CM | POA: Diagnosis not present

## 2018-07-10 DIAGNOSIS — M961 Postlaminectomy syndrome, not elsewhere classified: Secondary | ICD-10-CM | POA: Diagnosis not present

## 2018-07-25 ENCOUNTER — Encounter: Payer: Medicare Other | Admitting: Family Medicine

## 2018-08-08 ENCOUNTER — Other Ambulatory Visit: Payer: Self-pay | Admitting: Family Medicine

## 2018-09-19 ENCOUNTER — Ambulatory Visit: Payer: Medicare Other | Admitting: Urology

## 2018-10-08 ENCOUNTER — Other Ambulatory Visit: Payer: Self-pay | Admitting: Family Medicine

## 2018-10-08 DIAGNOSIS — M48061 Spinal stenosis, lumbar region without neurogenic claudication: Secondary | ICD-10-CM | POA: Diagnosis not present

## 2018-10-08 DIAGNOSIS — M5416 Radiculopathy, lumbar region: Secondary | ICD-10-CM | POA: Diagnosis not present

## 2018-10-08 DIAGNOSIS — Z79899 Other long term (current) drug therapy: Secondary | ICD-10-CM | POA: Diagnosis not present

## 2018-10-08 DIAGNOSIS — M461 Sacroiliitis, not elsewhere classified: Secondary | ICD-10-CM | POA: Diagnosis not present

## 2018-10-16 DIAGNOSIS — H401122 Primary open-angle glaucoma, left eye, moderate stage: Secondary | ICD-10-CM | POA: Diagnosis not present

## 2018-10-16 LAB — HM DIABETES EYE EXAM

## 2018-10-23 ENCOUNTER — Other Ambulatory Visit: Payer: Self-pay

## 2018-10-24 ENCOUNTER — Other Ambulatory Visit: Payer: Self-pay

## 2018-10-24 NOTE — Patient Outreach (Signed)
[facility]) Care Management  10/24/2018  Kevin Rogers 1931-08-19 [id number]   Referral Date: 10/23/2018 Referral Source: EMMI prevent Referral Reason: EMMI engagement scoring   Outreach Attempt: Spoke with patient but he states he cannot talk as he is leaving for an appointment.  Advised that CM would call back another time.  He verbalized understanding.    Plan: RN CM will attempt patient again within 4 business day and send a letter.   [name], RN, MSN [facility] Care Management Care Management Coordinator Direct Line [phone] Toll Free: [phone]  Fax: [phone]

## 2018-10-30 ENCOUNTER — Other Ambulatory Visit: Payer: Self-pay

## 2018-10-30 NOTE — Patient Outreach (Signed)
[name]) Care Management  10/30/2018  Kevin Rogers 05-26-1931 [id number]   Referral Date: 10/23/2018 Referral Source: EMMI prevent Referral Reason: EMMI engagement scoring   Outreach Attempt: Spoke with patient but he states he cannot talk and asked for a call back.  Advised that CM would call back another time.  He verbalized understanding.    Plan: RN CM will attempt patient again within 4 business days.  [name], RN, MSN [place] Management Care Management Coordinator Direct Line [phone] Cell [phone] Toll Free: [phone]  Fax: [phone]

## 2018-11-01 ENCOUNTER — Other Ambulatory Visit: Payer: Self-pay

## 2018-11-01 NOTE — Patient Outreach (Signed)
[facility] [facility]) Care Management  11/01/2018  Carrel Leather 01/27/1932 [id number]   Referral Date: 10/23/2018 Referral Source: EMMI prevent Referral Reason: EMMI engagement scoring   Outreach Attempt: No answer.  HIPAA compliant voice message left.  Plan: RN CM will wait for return call.  If no return call will close case.    [name], RN, MSN [place] Management Care Management Coordinator Direct Line [phone] Cell [phone] Toll Free: [phone]  Fax: [phone]

## 2018-11-06 ENCOUNTER — Other Ambulatory Visit: Payer: Self-pay

## 2018-11-06 NOTE — Patient Outreach (Signed)
[facility]) Care Management  11/06/2018  Kevin Rogers 06-18-1931 [id number]   Multiple attempts to establish contact with patient without success. No response from letter mailed to patient.   Plan: RN CM will close case at this time.   [name], RN, MSN [place] Management Care Management Coordinator Direct Line [phone] Cell [phone] Toll Free: [phone]  Fax: [phone]

## 2018-11-13 ENCOUNTER — Ambulatory Visit: Payer: Medicare Other

## 2019-01-02 ENCOUNTER — Encounter: Payer: Self-pay | Admitting: Family Medicine

## 2019-01-02 ENCOUNTER — Other Ambulatory Visit: Payer: Self-pay

## 2019-01-02 ENCOUNTER — Ambulatory Visit (INDEPENDENT_AMBULATORY_CARE_PROVIDER_SITE_OTHER): Payer: Medicare Other

## 2019-01-02 DIAGNOSIS — Z23 Encounter for immunization: Secondary | ICD-10-CM | POA: Diagnosis not present

## 2019-01-08 DIAGNOSIS — M961 Postlaminectomy syndrome, not elsewhere classified: Secondary | ICD-10-CM | POA: Diagnosis not present

## 2019-01-08 DIAGNOSIS — M47814 Spondylosis without myelopathy or radiculopathy, thoracic region: Secondary | ICD-10-CM | POA: Diagnosis not present

## 2019-01-08 DIAGNOSIS — I10 Essential (primary) hypertension: Secondary | ICD-10-CM | POA: Diagnosis not present

## 2019-01-08 DIAGNOSIS — Z79899 Other long term (current) drug therapy: Secondary | ICD-10-CM | POA: Diagnosis not present

## 2019-01-08 DIAGNOSIS — M7918 Myalgia, other site: Secondary | ICD-10-CM | POA: Diagnosis not present

## 2019-01-16 ENCOUNTER — Other Ambulatory Visit: Payer: Self-pay

## 2019-01-16 ENCOUNTER — Ambulatory Visit (INDEPENDENT_AMBULATORY_CARE_PROVIDER_SITE_OTHER): Payer: Medicare Other

## 2019-01-16 DIAGNOSIS — Z0000 Encounter for general adult medical examination without abnormal findings: Secondary | ICD-10-CM

## 2019-01-16 NOTE — Patient Instructions (Signed)
Kevin Rogers , Thank you for taking time to come for your Medicare Wellness Visit. I appreciate your ongoing commitment to your health goals. Please review the following plan we discussed and let me know if I can assist you in the future.   Screening recommendations/referrals: Colorectal Screening: not indicated   Vision and Dental Exams: Recommended annual ophthalmology exams for early detection of glaucoma and other disorders of the eye Recommended annual dental exams for proper oral hygiene  Diabetic Exams: Diabetic Eye Exam: up to date; I will request your last report Diabetic Foot Exam: at next office visit   Vaccinations: Influenza vaccine: completed 01/02/19 Pneumococcal vaccine: last 03/23/15 Tdap vaccine:up to date; last 09/19/17 Shingles vaccine: Please call your insurance company to determine your out of pocket expense for the Shingrix vaccine. You may receive this vaccine at your local pharmacy.  Advanced directives: Please bring a copy of your POA (Power of Attorney) and/or Living Will to your next appointment.  Goals: Recommend to remove any items from the home that may cause slips or trips.  Next appointment: Please schedule your Annual Wellness Visit with your Nurse Health Advisor in one year.  Preventive Care 83 Years and Older, Male Preventive care refers to lifestyle choices and visits with your health care provider that can promote health and wellness. What does preventive care include?  A yearly physical exam. This is also called an annual well check.  Dental exams once or twice a year.  Routine eye exams. Ask your health care provider how often you should have your eyes checked.  Personal lifestyle choices, including:  Daily care of your teeth and gums.  Regular physical activity.  Eating a healthy diet.  Avoiding tobacco and drug use.  Limiting alcohol use.  Practicing safe sex.  Taking low doses of aspirin every day if recommended by your health  care provider..  Taking vitamin and mineral supplements as recommended by your health care provider. What happens during an annual well check? The services and screenings done by your health care provider during your annual well check will depend on your age, overall health, lifestyle risk factors, and family history of disease. Counseling  Your health care provider may ask you questions about your:  Alcohol use.  Tobacco use.  Drug use.  Emotional well-being.  Home and relationship well-being.  Sexual activity.  Eating habits.  History of falls.  Memory and ability to understand (cognition).  Work and work Statistician. Screening  You may have the following tests or measurements:  Height, weight, and BMI.  Blood pressure.  Lipid and cholesterol levels. These may be checked every 5 years, or more frequently if you are over 83 years old.  Skin check.  Lung cancer screening. You may have this screening every year starting at age 83 if you have a 30-pack-year history of smoking and currently smoke or have quit within the past 15 years.  Fecal occult blood test (FOBT) of the stool. You may have this test every year starting at age 83.  Flexible sigmoidoscopy or colonoscopy. You may have a sigmoidoscopy every 5 years or a colonoscopy every 10 years starting at age 83.  Prostate cancer screening. Recommendations will vary depending on your family history and other risks.  Hepatitis C blood test.  Hepatitis B blood test.  Sexually transmitted disease (STD) testing.  Diabetes screening. This is done by checking your blood sugar (glucose) after you have not eaten for a while (fasting). You may have this done every  1-3 years.  Abdominal aortic aneurysm (AAA) screening. You may need this if you are a current or former smoker.  Osteoporosis. You may be screened starting at age 83 if you are at high risk. Talk with your health care provider about your test results,  treatment options, and if necessary, the need for more tests. Vaccines  Your health care provider may recommend certain vaccines, such as:  Influenza vaccine. This is recommended every year.  Tetanus, diphtheria, and acellular pertussis (Tdap, Td) vaccine. You may need a Td booster every 10 years.  Zoster vaccine. You may need this after age 83.  Pneumococcal 13-valent conjugate (PCV13) vaccine. One dose is recommended after age 83.  Pneumococcal polysaccharide (PPSV23) vaccine. One dose is recommended after age 83. Talk to your health care provider about which screenings and vaccines you need and how often you need them. This information is not intended to replace advice given to you by your health care provider. Make sure you discuss any questions you have with your health care provider. Document Released: 05/15/2015 Document Revised: 01/06/2016 Document Reviewed: 02/17/2015 Elsevier Interactive Patient Education  2017 [place] Prevention in the Home Falls can cause injuries. They can happen to people of all ages. There are many things you can do to make your home safe and to help prevent falls. What can I do on the outside of my home?  Regularly fix the edges of walkways and driveways and fix any cracks.  Remove anything that might make you trip as you walk through a door, such as a raised step or threshold.  Trim any bushes or trees on the path to your home.  Use bright outdoor lighting.  Clear any walking paths of anything that might make someone trip, such as rocks or tools.  Regularly check to see if handrails are loose or broken. Make sure that both sides of any steps have handrails.  Any raised decks and porches should have guardrails on the edges.  Have any leaves, snow, or ice cleared regularly.  Use sand or salt on walking paths during winter.  Clean up any spills in your garage right away. This includes oil or grease spills. What can I do in the  bathroom?  Use night lights.  Install grab bars by the toilet and in the tub and shower. Do not use towel bars as grab bars.  Use non-skid mats or decals in the tub or shower.  If you need to sit down in the shower, use a plastic, non-slip stool.  Keep the floor dry. Clean up any water that spills on the floor as soon as it happens.  Remove soap buildup in the tub or shower regularly.  Attach bath mats securely with double-sided non-slip rug tape.  Do not have throw rugs and other things on the floor that can make you trip. What can I do in the bedroom?  Use night lights.  Make sure that you have a light by your bed that is easy to reach.  Do not use any sheets or blankets that are too big for your bed. They should not hang down onto the floor.  Have a firm chair that has side arms. You can use this for support while you get dressed.  Do not have throw rugs and other things on the floor that can make you trip. What can I do in the kitchen?  Clean up any spills right away.  Avoid walking on wet floors.  Keep items  that you use a lot in easy-to-reach places.  If you need to reach something above you, use a strong step stool that has a grab bar.  Keep electrical cords out of the way.  Do not use floor polish or wax that makes floors slippery. If you must use wax, use non-skid floor wax.  Do not have throw rugs and other things on the floor that can make you trip. What can I do with my stairs?  Do not leave any items on the stairs.  Make sure that there are handrails on both sides of the stairs and use them. Fix handrails that are broken or loose. Make sure that handrails are as long as the stairways.  Check any carpeting to make sure that it is firmly attached to the stairs. Fix any carpet that is loose or worn.  Avoid having throw rugs at the top or bottom of the stairs. If you do have throw rugs, attach them to the floor with carpet tape.  Make sure that you have a  light switch at the top of the stairs and the bottom of the stairs. If you do not have them, ask someone to add them for you. What else can I do to help prevent falls?  Wear shoes that:  Do not have high heels.  Have rubber bottoms.  Are comfortable and fit you well.  Are closed at the toe. Do not wear sandals.  If you use a stepladder:  Make sure that it is fully opened. Do not climb a closed stepladder.  Make sure that both sides of the stepladder are locked into place.  Ask someone to hold it for you, if possible.  Clearly mark and make sure that you can see:  Any grab bars or handrails.  First and last steps.  Where the edge of each step is.  Use tools that help you move around (mobility aids) if they are needed. These include:  Canes.  Walkers.  Scooters.  Crutches.  Turn on the lights when you go into a dark area. Replace any light bulbs as soon as they burn out.  Set up your furniture so you have a clear path. Avoid moving your furniture around.  If any of your floors are uneven, fix them.  If there are any pets around you, be aware of where they are.  Review your medicines with your doctor. Some medicines can make you feel dizzy. This can increase your chance of falling. Ask your doctor what other things that you can do to help prevent falls. This information is not intended to replace advice given to you by your health care provider. Make sure you discuss any questions you have with your health care provider. Document Released: 02/12/2009 Document Revised: 09/24/2015 Document Reviewed: 05/23/2014 Elsevier Interactive Patient Education  2017 [employer].

## 2019-01-16 NOTE — Progress Notes (Signed)
This visit is being conducted via phone call due to the COVID-19 pandemic. This patient has given me verbal consent via phone to conduct this visit, patient states they are participating from their home address. Some vital signs may be absent or patient reported.   Patient identification: identified by name, DOB, and current address.   Subjective:   Kevin Rogers is a 85 y.o. male who presents for Medicare Annual/Subsequent preventive examination.  Review of Systems:   Cardiac Risk Factors include: advanced age (>17men, >75 women);diabetes mellitus;dyslipidemia;male gender;hypertension     Objective:    Vitals: There were no vitals taken for this visit.  There is no height or weight on file to calculate BMI.  Advanced Directives 01/16/2019 02/26/2018 11/07/2017 10/18/2016 12/08/2015 07/20/2015 06/10/2015  Does Patient Have a Medical Advance Directive? Yes No Yes Yes Yes Yes Yes  Type of Advance Directive Living will;Healthcare Power of Attorney - Living will;Healthcare Power of [place];Living will Living will [place];Living will [place];Living will  Does patient want to make changes to medical advance directive? No - Patient declined - No - Patient declined - - - No - Patient declined  Copy of [place] in Chart? No - copy requested - No - copy requested No - copy requested No - copy requested - No - copy requested  Would patient like information on creating a medical advance directive? - No - Patient declined - No - Patient declined - - -    Tobacco Social History   Tobacco Use  Smoking Status Former Smoker  . Packs/day: 0.25  . Years: 20.00  . Pack years: 5.00  . Types: Cigarettes  . Quit date: 09/28/1995  . Years since quitting: 23.3  Smokeless Tobacco Never Used     Counseling given: Not Answered   Clinical Intake:  Pre-visit preparation completed: Yes  Pain : No/denies pain   Diabetes: Yes CBG done?: No Did pt. bring in CBG monitor from home?: No(reports blood sugars to be in the 120s-130s)  How often do you need to have someone help you when you read instructions, pamphlets, or other written materials from your doctor or pharmacy?: 1 - Never  Interpreter Needed?: No  Information entered by :: Denman George LPN  Past Medical History:  Diagnosis Date  . Acute bronchitis 09/12/2007   Qualifier: Diagnosis of  By: [name] MD, [name]   . Diabetes mellitus type 2 with complications ([place]) 0000000  . Glaucoma    left eye  . Gout   . Hyperlipidemia   . Hypertension   . 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 ANTERIOR LAT LUMBAR FUSION Left 01/03/2013   Procedure: ANTERIOR LATERAL LUMBAR FUSION lumbar three-four;  Surgeon: [name], MD;  Location: [facility];  Service: Neurosurgery;  Laterality: Left;  . BACK SURGERY     lumbar fusion - 2016, feb  . COLONOSCOPY W/ POLYPECTOMY    . DENTAL SURGERY     implanted teeth x 2  . HERNIA REPAIR  1989  . LUMBAR LAMINECTOMY  1997  . LUMBAR PERCUTANEOUS PEDICLE SCREW 1 LEVEL  01/03/2013   Procedure: LUMBAR PERCUTANEOUS PEDICLE SCREWS LUMBAR THREE-FOUR;  Surgeon: [name], MD;  Location: [facility];  Service: Neurosurgery;;  . LUMBAR WOUND DEBRIDEMENT N/A 07/23/2014   Procedure: LUMBAR WOUND  DEBRIDEMENT;  Surgeon: [name], MD;  Location: [facility];  Service: Neurosurgery;  Laterality: N/A;  . TONSILLECTOMY     Family History  Problem Relation Age of Onset  . Heart disease Mother        83, second hand  . Heart disease Father        29, former smoker   Social History   Socioeconomic History  . Marital status: Married    Spouse name: Not on file  . Number of children: Not on file  . Years of education: Not on file  . Highest education level: Associate degree: occupational,  Hotel manager, or vocational program  Occupational History    Comment: Retired from [employer]  . Financial resource strain: Not on file  . Food insecurity    Worry: Not on file    Inability: Not on file  . Transportation needs    Medical: Not on file    Non-medical: Not on file  Tobacco Use  . Smoking status: Former Smoker    Packs/day: 0.25    Years: 20.00    Pack years: 5.00    Types: Cigarettes    Quit date: 09/28/1995    Years since quitting: 23.3  . Smokeless tobacco: Never Used  Substance and Sexual Activity  . Alcohol use: Yes    Alcohol/week: 1.0 standard drinks    Types: 1 Glasses of wine per week    Comment: rarely  . Drug use: No  . Sexual activity: Yes  Lifestyle  . Physical activity    Days per week: Not on file    Minutes per session: Not on file  . Stress: Not on file  Relationships  . Social Herbalist on phone: Not on file    Gets together: Not on file    Attends religious service: Not on file    Active member of club or organization: Not on file    Attends meetings of clubs or organizations: Not on file    Relationship status: Not on file  Other Topics Concern  . Not on file  Social History Narrative   Remarried. 2017. Widowed 2009. 2 sons. 2 grandkids-boy/girl. No greatgrandkids.     Did everything for himself in past but back limiting him now      Retired from [employer]: golf stopped(hard with back), watch tv (pain with moving around)    Outpatient Encounter Medications as of 01/16/2019  Medication Sig  . acetaminophen (TYLENOL) 500 MG tablet Take 500-1,000 mg by mouth every 6 (six) hours as needed (pain). Reported on 07/15/2015  . atorvastatin (LIPITOR) 10 MG tablet TAKE 1 TABLET BY MOUTH ONCE A WEEK  . finasteride (PROSCAR) 5 MG tablet TAKE 1 TABLET BY MOUTH EVERY DAY  . glipiZIDE (GLUCOTROL XL) 5 MG 24 hr tablet TAKE 1 TABLET BY MOUTH EVERY DAY  . glucose blood (ONETOUCH VERIO) test strip Use  to test blood sugars daily. Dx: E11.9  . HYDROcodone-acetaminophen (NORCO/VICODIN) 5-325 MG tablet Take 1-2 tablets by mouth every 6 (six) hours as needed.  [name] losartan (COZAAR) 100 MG tablet TAKE 1 TABLET BY MOUTH  DAILY  . ONE TOUCH LANCETS MISC Use to test blood sugars. Dx:E11.9  . tamsulosin (FLOMAX) 0.4 MG CAPS capsule TAKE 1 CAPSULE BY MOUTH  DAILY  . TRAVATAN Z 0.004 % SOLN ophthalmic solution Place 1 drop into the left eye at bedtime.   . vitamin  B-12 (CYANOCOBALAMIN) 1000 MCG tablet Take 1,000 mcg by mouth daily.  . [DISCONTINUED] tamsulosin (FLOMAX) 0.4 MG CAPS capsule Take 1 capsule (0.4 mg total) by mouth daily.  . [DISCONTINUED] tamsulosin (FLOMAX) 0.4 MG CAPS capsule TAKE 1 CAPSULE BY MOUTH  DAILY   No facility-administered encounter medications on file as of 01/16/2019.     Activities of Daily Living In your present state of health, do you have any difficulty performing the following activities: 01/16/2019  Hearing? N  Vision? N  Difficulty concentrating or making decisions? N  Walking or climbing stairs? Y  Comment due to impaired mobility  Dressing or bathing? N  Doing errands, shopping? N  Preparing Food and eating ? N  Using the Toilet? N  In the past six months, have you accidently leaked urine? N  Do you have problems with loss of bowel control? N  Managing your Medications? N  Managing your Finances? N  Housekeeping or managing your Housekeeping? N  Some recent data might be hidden    Patient Care Team: [name], MD as PCP - General (Family Medicine) [name], MD as Consulting Physician (Neurosurgery) Ralene Bathe, MD as Consulting Physician (Ophthalmology) Wyatt Portela, MD as Consulting Physician (Oncology) Marlaine Hind, MD as Consulting Physician (Physical Medicine and Rehabilitation)   Assessment:   This is a routine wellness examination for She[place].  Exercise Activities and Dietary recommendations Current Exercise Habits: The  patient does not participate in regular exercise at present, Exercise limited by: orthopedic condition(s)  Goals    . Gain strength on the back    . I want my back to feel better     . Maintain current health status       Fall Risk Fall Risk  01/16/2019 11/07/2017 10/18/2016 07/20/2016 07/06/2015  Falls in the past year? 1 No No Yes No  Number falls in past yr: 0 - - 1 -  Injury with Fall? 0 - - Yes -  Risk for fall due to : Impaired balance/gait;Impaired mobility - Impaired balance/gait;Impaired mobility Impaired balance/gait -  Risk for fall due to: Comment - - - - -  Follow up Education provided;Falls evaluation completed - - - -   Is the patient's home free of loose throw rugs in walkways, pet beds, electrical cords, etc?   yes      Grab bars in the bathroom? yes      Handrails on the stairs?   yes      Adequate lighting?   yes  Depression Screen PHQ 2/9 Scores 01/16/2019 11/07/2017 10/18/2016 07/20/2016  PHQ - 2 Score 0 0 0 0  PHQ- 9 Score - 0 - -    Cognitive Function-no cognitive concerns at this time      6CIT Screen 01/16/2019  What Year? 0 points  What month? 0 points  What time? 0 points  Count back from 20 0 points  Months in reverse 0 points  Repeat phrase 0 points  Total Score 0    Immunization History  Administered Date(s) Administered  . Fluad Quad(high Dose 65+) 01/02/2019  . Influenza Split 02/10/2011, 01/11/2012  . Influenza Whole 03/15/2007, 01/31/2008, 02/04/2009, 02/01/2010  . Influenza, High Dose Seasonal PF 01/18/2016, 01/24/2017, 01/29/2018  . Influenza,inj,Quad PF,6+ Mos 02/12/2014, 01/13/2015  . Pneumococcal Conjugate-13 03/23/2015  . Pneumococcal Polysaccharide-23 03/24/2009, 02/12/2014  . Td 03/09/2007  . Tdap 09/19/2017    Qualifies for Shingles Vaccine? Discussed and patient will check with pharmacy for coverage.  Patient education handout  provided    Screening Tests Health Maintenance  Topic Date Due  . HEMOGLOBIN A1C  05/26/2018  .  OPHTHALMOLOGY EXAM  06/13/2018  . FOOT EXAM  07/25/2018  . TETANUS/TDAP  09/20/2027  . INFLUENZA VACCINE  Completed  . PNA vac Low Risk Adult  Completed   Cancer Screenings: Lung: Low Dose CT Chest recommended if Age 78-80 years, 30 pack-year currently smoking OR have quit w/in 15years. Patient does not qualify. Colorectal: not indicated      Plan:    I have personally reviewed and addressed the Medicare Annual Wellness questionnaire and have noted the following in the patient's chart:  A. Medical and social history B. Use of alcohol, tobacco or illicit drugs  C. Current medications and supplements D. Functional ability and status E.  Nutritional status F.  Physical activity G. Advance directives H. List of other physicians I.  Hospitalizations, surgeries, and ER visits in previous 12 months J.  [place] such as hearing and vision if needed, cognitive and depression L. Referrals, records requested, and appointments- will request last diabetic eye exam   In addition, I have reviewed and discussed with patient certain preventive protocols, quality metrics, and best practice recommendations. A written personalized care plan for preventive services as well as general preventive health recommendations were provided to patient.   Signed,  Denman George, LPN  Nurse Health Advisor   Nurse Notes: no additional

## 2019-01-17 ENCOUNTER — Other Ambulatory Visit: Payer: Self-pay | Admitting: Family Medicine

## 2019-01-17 NOTE — Progress Notes (Signed)
I have reviewed and agree with note, evaluation, plan.   Erwin Nishiyama, MD  

## 2019-01-31 ENCOUNTER — Other Ambulatory Visit: Payer: Self-pay | Admitting: Family Medicine

## 2019-02-07 ENCOUNTER — Inpatient Hospital Stay: Payer: Medicare Other

## 2019-02-07 ENCOUNTER — Other Ambulatory Visit: Payer: Self-pay

## 2019-02-07 ENCOUNTER — Inpatient Hospital Stay: Payer: Medicare Other | Attending: Oncology | Admitting: Oncology

## 2019-02-07 VITALS — BP 144/67 | HR 75 | Temp 98.7°F | Resp 18 | Ht 72.0 in | Wt 254.2 lb

## 2019-02-07 DIAGNOSIS — Z79899 Other long term (current) drug therapy: Secondary | ICD-10-CM | POA: Diagnosis not present

## 2019-02-07 DIAGNOSIS — E785 Hyperlipidemia, unspecified: Secondary | ICD-10-CM | POA: Diagnosis not present

## 2019-02-07 DIAGNOSIS — C9110 Chronic lymphocytic leukemia of B-cell type not having achieved remission: Secondary | ICD-10-CM

## 2019-02-07 DIAGNOSIS — I10 Essential (primary) hypertension: Secondary | ICD-10-CM | POA: Insufficient documentation

## 2019-02-07 DIAGNOSIS — Z7984 Long term (current) use of oral hypoglycemic drugs: Secondary | ICD-10-CM | POA: Insufficient documentation

## 2019-02-07 DIAGNOSIS — E119 Type 2 diabetes mellitus without complications: Secondary | ICD-10-CM | POA: Insufficient documentation

## 2019-02-07 LAB — CMP (CANCER CENTER ONLY)
ALT: 11 U/L (ref 0–44)
AST: 12 U/L — ABNORMAL LOW (ref 15–41)
Albumin: 3.9 g/dL (ref 3.5–5.0)
Alkaline Phosphatase: 66 U/L (ref 38–126)
Anion gap: 11 (ref 5–15)
BUN: 21 mg/dL (ref 8–23)
CO2: 26 mmol/L (ref 22–32)
Calcium: 10 mg/dL (ref 8.9–10.3)
Chloride: 106 mmol/L (ref 98–111)
Creatinine: 1.57 mg/dL — ABNORMAL HIGH (ref 0.61–1.24)
GFR, Est AFR Am: 46 mL/min — ABNORMAL LOW (ref >60)
GFR, Estimated: 40 mL/min — ABNORMAL LOW (ref >60)
Glucose, Bld: 161 mg/dL — ABNORMAL HIGH (ref 70–99)
Potassium: 4.6 mmol/L (ref 3.5–5.1)
Sodium: 143 mmol/L (ref 135–145)
Total Bilirubin: 0.9 mg/dL (ref 0.3–1.2)
Total Protein: 6.7 g/dL (ref 6.5–8.1)

## 2019-02-07 LAB — CBC WITH DIFFERENTIAL (CANCER CENTER ONLY)
Abs Immature Granulocytes: 0.02 10*3/uL (ref 0.00–0.07)
Basophils Absolute: 0 10*3/uL (ref 0.0–0.1)
Basophils Relative: 0 %
Eosinophils Absolute: 0.1 10*3/uL (ref 0.0–0.5)
Eosinophils Relative: 0 %
HCT: 38.5 % — ABNORMAL LOW (ref 39.0–52.0)
Hemoglobin: 12.7 g/dL — ABNORMAL LOW (ref 13.0–17.0)
Immature Granulocytes: 0 %
Lymphocytes Relative: 69 %
Lymphs Abs: 8.3 10*3/uL — ABNORMAL HIGH (ref 0.7–4.0)
MCH: 29.5 pg (ref 26.0–34.0)
MCHC: 33 g/dL (ref 30.0–36.0)
MCV: 89.5 fL (ref 80.0–100.0)
Monocytes Absolute: 0.3 10*3/uL (ref 0.1–1.0)
Monocytes Relative: 2 %
Neutro Abs: 3.5 10*3/uL (ref 1.7–7.7)
Neutrophils Relative %: 29 %
Platelet Count: 145 10*3/uL — ABNORMAL LOW (ref 150–400)
RBC: 4.3 MIL/uL (ref 4.22–5.81)
RDW: 12.6 % (ref 11.5–15.5)
WBC Count: 12.2 10*3/uL — ABNORMAL HIGH (ref 4.0–10.5)
nRBC: 0 % (ref 0.0–0.2)

## 2019-02-07 NOTE — Progress Notes (Signed)
[facility] PROGRESS NOTE 02/07/19   DIAGNOSIS: 85 year old man stage is 0 CLL presented with lymphocytosis in 2007.     CURRENT THERAPY: Active surveillance.  INTERVAL HISTORY:  Kevin Rogers is here for return evaluation.  Since the last visit, he reports feeling reasonably fair without any major complications.  He continues to ambulate with the help of a walker without any recent falls or syncope.  He denies any recent hospitalizations or illnesses.  He denies any bone pain or pathological fractures.  Denies any recurrent infections or painful adenopathy.  He denies any constitutional symptoms.  Patient denied any alteration mental status, neuropathy, confusion or dizziness.  Denies any headaches or lethargy.  Denies any night sweats, weight loss or changes in appetite.  Denied orthopnea, dyspnea on exertion or chest discomfort.  Denies shortness of breath, difficulty breathing hemoptysis or cough.  Denies any abdominal distention, nausea, early satiety or dyspepsia.  Denies any hematuria, frequency, dysuria or nocturia.  Denies any skin irritation, dryness or rash.  Denies any ecchymosis or petechiae.  Denies any lymphadenopathy or clotting.  Denies any heat or cold intolerance.  Denies any anxiety or depression.  Remaining review of system is negative.    MEDICAL HISTORY: Past Medical History:  Diagnosis Date  . Acute bronchitis 09/12/2007   Qualifier: Diagnosis of  By: [name] MD, [name]   . Diabetes mellitus type 2 with complications ([place]) 0000000  . Glaucoma    left eye  . Gout   . Hyperlipidemia   . Hypertension   . Leukemia, chronic ([place]) 2001   does not take treatment just has blood levels checked once a year    IALLERGIES:  has No Known Allergies.  MEDICATIONS: Updated and reviewed today.   Acetaminophen, atorvastatin, finasteride, glipizide, glucose blood, hydrocodone-acetaminophen, losartan, one touch lancets, tamsulosin, travatan z, and  vitamin b-12.   PHYSICAL EXAMINATION: ECOG PERFORMANCE STATUS: 1     General appearance: Alert, awake without any distress. Head: Atraumatic without abnormalities Oropharynx: Without any thrush or ulcers. Eyes: No scleral icterus. Lymph nodes: No lymphadenopathy noted in the cervical, supraclavicular, or axillary nodes Heart:regular rate and rhythm, without any murmurs or gallops.   Lung: Clear to auscultation without any rhonchi, wheezes or dullness to percussion. Abdomin: Soft, nontender without any shifting dullness or ascites. Musculoskeletal: No clubbing or cyanosis. Neurological: No motor or sensory deficits. Skin: No rashes or lesions. Psychiatric: Mood and affect appeared normal.    CBC    Component Value Date/Time   WBC 19.1 (H) 02/26/2018 1957   RBC 4.32 02/26/2018 1957   HGB 12.9 (L) 02/26/2018 1957   HGB 13.3 02/09/2017 0851   HCT 39.0 02/26/2018 1957   HCT 39.8 02/09/2017 0851   PLT 136 (L) 02/26/2018 1957   PLT 144 02/09/2017 0851   MCV 90.3 02/26/2018 1957   MCV 89.4 02/09/2017 0851   MCH 29.9 02/26/2018 1957   MCHC 33.1 02/26/2018 1957   RDW 13.3 02/26/2018 1957   RDW 13.5 02/09/2017 0851   LYMPHSABS 11.0 (H) 02/26/2018 1957   LYMPHSABS 10.5 (H) 02/09/2017 0851   MONOABS 0.5 02/26/2018 1957   MONOABS 0.2 02/09/2017 0851   EOSABS 0.0 02/26/2018 1957   EOSABS 0.1 02/09/2017 0851   BASOSABS 0.0 02/26/2018 1957   BASOSABS 0.0 02/09/2017 0851       Assessment and plan:  85 year old man with:   1.  Stage 0 CLL diagnosed in 2007 on peripheral blood flow cytometry that was confirmed in  2018.  He has been on active surveillance without any indication for treatment at this time.  Indication for therapy were reviewed today which includes a rapid rise in his white cell count, painful adenopathy and bone marrow infiltration.  Treatment options would include oral targeted therapy, chemotherapy or chemoimmunotherapy combination.  Side effects associated  with these medications were reviewed briefly today and does not require any treatment at this time.  His CBC was reviewed and his lymphocytosis has not changed for the last 13 years.  I recommend repeating laboratory testing in 1 year.  2.  Cytopenias: We will continue to monitor for thrombocytopenia or anemia associated with his condition.  3.  Infectious disease: No evidence of recurrent opportunistic infection at this time.  We will continue to monitor given his history of CLL.  3. Follow-up: He will return in 12 months.   15  minutes was spent with the patient face-to-face today.  More than 50% of time was spent on reviewing his disease status, treatment options and complications related therapy.Zola Button MD 02/07/19

## 2019-02-08 ENCOUNTER — Other Ambulatory Visit: Payer: Self-pay

## 2019-02-08 MED ORDER — ONETOUCH VERIO VI STRP: ORAL_STRIP | 12 refills | Status: DC

## 2019-02-26 DIAGNOSIS — M961 Postlaminectomy syndrome, not elsewhere classified: Secondary | ICD-10-CM | POA: Diagnosis not present

## 2019-02-26 DIAGNOSIS — M5416 Radiculopathy, lumbar region: Secondary | ICD-10-CM | POA: Diagnosis not present

## 2019-02-26 DIAGNOSIS — Z981 Arthrodesis status: Secondary | ICD-10-CM | POA: Diagnosis not present

## 2019-02-26 DIAGNOSIS — M5136 Other intervertebral disc degeneration, lumbar region: Secondary | ICD-10-CM | POA: Diagnosis not present

## 2019-03-14 ENCOUNTER — Encounter: Payer: Self-pay | Admitting: Family Medicine

## 2019-03-14 ENCOUNTER — Other Ambulatory Visit: Payer: Self-pay

## 2019-03-14 ENCOUNTER — Ambulatory Visit (INDEPENDENT_AMBULATORY_CARE_PROVIDER_SITE_OTHER): Payer: Medicare Other | Admitting: Family Medicine

## 2019-03-14 VITALS — BP 128/70 | HR 65 | Temp 98.0°F | Ht 72.0 in | Wt 250.2 lb

## 2019-03-14 DIAGNOSIS — E785 Hyperlipidemia, unspecified: Secondary | ICD-10-CM | POA: Diagnosis not present

## 2019-03-14 DIAGNOSIS — E1142 Type 2 diabetes mellitus with diabetic polyneuropathy: Secondary | ICD-10-CM

## 2019-03-14 DIAGNOSIS — E1122 Type 2 diabetes mellitus with diabetic chronic kidney disease: Secondary | ICD-10-CM

## 2019-03-14 DIAGNOSIS — Z0000 Encounter for general adult medical examination without abnormal findings: Secondary | ICD-10-CM | POA: Diagnosis not present

## 2019-03-14 DIAGNOSIS — R351 Nocturia: Secondary | ICD-10-CM

## 2019-03-14 DIAGNOSIS — M159 Polyosteoarthritis, unspecified: Secondary | ICD-10-CM

## 2019-03-14 DIAGNOSIS — H409 Unspecified glaucoma: Secondary | ICD-10-CM

## 2019-03-14 DIAGNOSIS — M48061 Spinal stenosis, lumbar region without neurogenic claudication: Secondary | ICD-10-CM

## 2019-03-14 DIAGNOSIS — M1009 Idiopathic gout, multiple sites: Secondary | ICD-10-CM | POA: Diagnosis not present

## 2019-03-14 DIAGNOSIS — C9110 Chronic lymphocytic leukemia of B-cell type not having achieved remission: Secondary | ICD-10-CM

## 2019-03-14 DIAGNOSIS — E538 Deficiency of other specified B group vitamins: Secondary | ICD-10-CM

## 2019-03-14 DIAGNOSIS — N182 Chronic kidney disease, stage 2 (mild): Secondary | ICD-10-CM | POA: Diagnosis not present

## 2019-03-14 DIAGNOSIS — N183 Chronic kidney disease, stage 3 unspecified: Secondary | ICD-10-CM

## 2019-03-14 DIAGNOSIS — N401 Enlarged prostate with lower urinary tract symptoms: Secondary | ICD-10-CM

## 2019-03-14 DIAGNOSIS — M8949 Other hypertrophic osteoarthropathy, multiple sites: Secondary | ICD-10-CM

## 2019-03-14 DIAGNOSIS — I10 Essential (primary) hypertension: Secondary | ICD-10-CM

## 2019-03-14 LAB — POC URINALSYSI DIPSTICK (AUTOMATED)
Bilirubin, UA: NEGATIVE
Blood, UA: NEGATIVE
Glucose, UA: NEGATIVE
Ketones, UA: NEGATIVE
Leukocytes, UA: NEGATIVE
Nitrite, UA: NEGATIVE
Protein, UA: NEGATIVE
Spec Grav, UA: 1.015 (ref 1.010–1.025)
Urobilinogen, UA: 0.2 E.U./dL
pH, UA: 5.5 (ref 5.0–8.0)

## 2019-03-14 LAB — HEMOGLOBIN A1C: Hgb A1c MFr Bld: 7 % — ABNORMAL HIGH (ref 4.6–6.5)

## 2019-03-14 LAB — LIPID PANEL
Cholesterol: 182 mg/dL (ref 0–200)
HDL: 51.9 mg/dL (ref >39.00)
LDL Cholesterol: 105 mg/dL — ABNORMAL HIGH (ref 0–99)
NonHDL: 130.39
Total CHOL/HDL Ratio: 4
Triglycerides: 127 mg/dL (ref 0.0–149.0)
VLDL: 25.4 mg/dL (ref 0.0–40.0)

## 2019-03-14 LAB — VITAMIN B12: Vitamin B-12: 581 pg/mL (ref 211–911)

## 2019-03-14 NOTE — Progress Notes (Signed)
Phone: [phone]   Subjective:  Patient presents today for their annual physical. Chief complaint-noted.   See problem oriented charting- Review of Systems  Constitutional: Negative.   HENT: Positive for congestion and sinus pain.   Eyes: Negative.   Respiratory: Negative.   Cardiovascular: Negative.   Gastrointestinal: Negative.   Genitourinary: Negative.   Musculoskeletal: Positive for back pain and joint pain.  Skin: Negative.   Neurological: Negative.   Endo/Heme/Allergies: Negative.   Psychiatric/Behavioral: Negative.    The following were reviewed and entered/updated in epic: Past Medical History:  Diagnosis Date   Acute bronchitis 09/12/2007   Qualifier: Diagnosis of  By: [name] MD, [name]    Diabetes mellitus type 2 with complications ([place]) 0000000   Glaucoma    left eye   Gout    Hyperlipidemia    Hypertension    Leukemia, chronic ([place]) 2001   does not take treatment just has blood levels checked once a year   Patient Active Problem List   Diagnosis Date Noted   Chronic lymphocytic leukemia ([place]) 03/08/2007    Priority: High   Diabetes mellitus type II, controlled ([place]) 03/08/2007    Priority: High   History of osteomyelitis of lumbar vertebrae     Priority: Medium   Hyperlipidemia     Priority: Medium   CKD (chronic kidney disease), stage III 01/13/2015    Priority: Medium   Lumbar spinal stenosis 06/26/2014    Priority: Medium   BPH (benign prostatic hyperplasia) 06/24/2014    Priority: Medium   Gout 03/08/2007    Priority: Medium   Essential hypertension 03/08/2007    Priority: Medium   Enteritis due to Clostridium difficile     Priority: Low   Discitis of lumbar region     Priority: Low   B12 deficiency 12/12/2008    Priority: Low   Diabetic polyneuropathy ([place]) 12/12/2008    Priority: Low   ERECTILE DYSFUNCTION 03/18/2008    Priority: Low   Glaucoma 03/08/2007    Priority: Low   Osteoarthritis 03/08/2007   Priority: Low   Past Surgical History:  Procedure Laterality Date   ANTERIOR LAT LUMBAR FUSION Left 01/03/2013   Procedure: ANTERIOR LATERAL LUMBAR FUSION lumbar three-four;  Surgeon: [name], MD;  Location: [facility];  Service: Neurosurgery;  Laterality: Left;   BACK SURGERY     lumbar fusion - 2016, feb   COLONOSCOPY W/ POLYPECTOMY     DENTAL SURGERY     implanted teeth x 2   [place] 1 LEVEL  01/03/2013   Procedure: LUMBAR PERCUTANEOUS PEDICLE SCREWS LUMBAR THREE-FOUR;  Surgeon: [name], MD;  Location: [facility];  Service: Neurosurgery;;   LUMBAR WOUND DEBRIDEMENT N/A 07/23/2014   Procedure: LUMBAR WOUND DEBRIDEMENT;  Surgeon: [name], MD;  Location: [facility];  Service: Neurosurgery;  Laterality: N/A;   TONSILLECTOMY      Family History  Problem Relation Age of Onset   Heart disease Mother        64, second hand   Heart disease Father        44, former smoker    Medications- reviewed and updated Current Outpatient Medications  Medication Sig Dispense Refill   acetaminophen (TYLENOL) 500 MG tablet Take 500-1,000 mg by mouth every 6 (six) hours as needed (pain). Reported on 07/15/2015     atorvastatin (LIPITOR) 10 MG tablet TAKE 1 TABLET BY MOUTH  ONCE A WEEK 13 tablet 3   finasteride (PROSCAR) 5 MG tablet TAKE 1 TABLET BY MOUTH EVERY DAY 90 tablet 3   glipiZIDE (GLUCOTROL XL) 5 MG 24 hr tablet TAKE 1 TABLET BY MOUTH EVERY DAY 90 tablet 1   glucose blood (ONETOUCH VERIO) test strip Use to test blood sugars daily. Dx: E11.9 100 each 12   HYDROcodone-acetaminophen (NORCO/VICODIN) 5-325 MG tablet Take 1-2 tablets by mouth every 6 (six) hours as needed.     losartan (COZAAR) 100 MG tablet TAKE 1 TABLET BY MOUTH  DAILY 90 tablet 2   ONE TOUCH LANCETS MISC Use to test blood sugars. Dx:E11.9 100 each 11   tamsulosin (FLOMAX) 0.4 MG CAPS capsule TAKE 1 CAPSULE BY  MOUTH  DAILY 90 capsule 0   TRAVATAN Z 0.004 % SOLN ophthalmic solution Place 1 drop into the left eye at bedtime.      vitamin B-12 (CYANOCOBALAMIN) 1000 MCG tablet Take 1,000 mcg by mouth daily.     No current facility-administered medications for this visit.     Allergies-reviewed and updated No Known Allergies  Social History   Social History Narrative   Remarried. 2017. Widowed 2009. 2 sons. 2 grandkids-boy/girl. No greatgrandkids.     Did everything for himself in past but back limiting him now      Retired from [employer]: golf stopped(hard with back), watch tv (pain with moving around)   Objective  Objective:  BP 128/70    Pulse 65    Temp 98 F (36.7 C) (Temporal)    Ht 6' (1.829 m)    Wt 250 lb 3.2 oz (113.5 kg)    SpO2 96%    BMI 33.93 kg/m  Gen: NAD, resting comfortably HEENT: Mask not removed due to covid 19. TM normal. Bridge of nose normal. Eyelids normal.  Neck: no thyromegaly or cervical lymphadenopathy  CV: RRR no murmurs rubs or gallops Lungs: CTAB no crackles, wheeze, rhonchi Abdomen: soft/nontender/nondistended/normal bowel sounds. No rebound or guarding.  Ext: 1+ edema (usually wears compression stockings but not today) Skin: warm, dry Neuro: grossly normal, moves all extremities, PERRLA   Diabetic Foot Exam - Simple   Simple Foot Form Diabetic Foot exam was performed with the following findings: Yes 03/14/2019  9:55 AM  Visual Inspection No deformities, no ulcerations, no other skin breakdown bilaterally: Yes Sensation Testing Intact to touch and monofilament testing bilaterally: Yes Pulse Check Posterior Tibialis and Dorsalis pulse intact bilaterally: Yes Comments History of swelling on both feet.        Assessment and Plan  85 y.o. male presenting for annual physical.  Health Maintenance counseling: 1. Anticipatory guidance: Patient counseled regarding regular dental exams q6 months, eye exams yearly,  avoiding smoking and  second hand smoke , limiting alcohol to 2 beverages per day .   2. Risk factor reduction:  Advised patient of need for regular exercise and diet rich and fruits and vegetables to reduce risk of heart attack and stroke. Exercise- 2 times a week ride stationary bike for around 20 minutes. Diet- tries to eat healthy diet.  Congratulated on 4 pound weight loss from last visit Wt Readings from Last 3 Encounters:  03/14/19 250 lb 3.2 oz (113.5 kg)  02/07/19 254 lb 3.2 oz (115.3 kg)  03/15/18 254 lb (115.2 kg)  3. Immunizations/screenings/ancillary studies-discussed Shingrix at pharmacy- he plans not to get this as confident he never had chicken pox- discussed he still technically can  get this.  We are requesting copy of last diabetic eye exam Immunization History  Administered Date(s) Administered   Fluad Quad(high Dose 65+) 01/02/2019   Influenza Split 02/10/2011, 01/11/2012   Influenza Whole 03/15/2007, 01/31/2008, 02/04/2009, 02/01/2010   Influenza, High Dose Seasonal PF 01/18/2016, 01/24/2017, 01/29/2018   Influenza,inj,Quad PF,6+ Mos 02/12/2014, 01/13/2015   Pneumococcal Conjugate-13 03/23/2015   Pneumococcal Polysaccharide-23 03/24/2009, 02/12/2014   Td 03/09/2007   Tdap 09/19/2017  4. Prostate cancer screening-past age to based screening recommendations.Benign prostatic hyperplasia with nocturia-reasonable control on finasteride and flomax Lab Results  Component Value Date   PSA 0.51 07/08/2015   PSA 0.66 08/05/2013   PSA 0.80 07/23/2012   5. Colon cancer screening -past age based screening recommendations.  Normal colonoscopy in 2008 other than diverticulosis. asymptomatic  6. Skin cancer screening-low risk due to melanin content.  Advised regular sunscreen use. Denies worrisome, changing, or new skin lesions.  7. Former smoker-no regular screening recommended as quit over 20 years ago.  Will get UA  Status of chronic or acute concerns   Controlled type 2 diabetes mellitus  with stage 2 chronic kidney disease, without long-term current use of insulin (HCC)-controlled on glipizide in the past.  Update A1c today.  Recommended every 6 months follow-up at latest  Chronic lymphocytic leukemia (HCC)-follows with Dr. Alen Blew- saw 02/07/2019.  Updated CBC with differential and CMP recently with Dr. Alen Blew so opted out today.   Hyperlipidemia, unspecified hyperlipidemia type-patient was started on atorvastatin 10 mg once a week in 2019-   Idiopathic gout of multiple sites, unspecified chronicity-off allopurinol for over a year with no flareups of gout thankfully. Hold off on update uric acid as he prefers to stay off meds even if high as long as no gout flares   Essential hypertension-controlled on repeat on losartan 50 mg-half of 100 mg tablet  BP Readings from Last 3 Encounters:  03/14/19 128/70  02/07/19 (!) 144/67  03/15/18 113/67   Stage 3 chronic kidney disease, unspecified whether stage 3a or 3b CKD-has been much better since coming off meloxicam- stable when checked with oncology   Spinal stenosis of lumbar region, unspecified whether neurogenic claudication present- ongoing issues with pain- continues to receive hydrocodone from pain management. Patient going to a pain doctor and has had some injections- got referred to another surgeon . Updated x-rays showed stable hardware. Has seen Dr. Rita Ohara. Pain management Dr. Brien Few - I was honest with patient I am not sure much more can be done for the back. He asks about seeing a "muscle Specialist" - I told him we could get the opinion of Dr. Charlann Boxer but this referral would not be for pain management it would be for a second set of eyes to see if any other tweaks can be made to improve his back pain. We will try to get copy of records from Dr. Rita Ohara first so he can have those on hand.  - asked patient to call me back in 3 weeks to see if we have received  Primary osteoarthritis involving multiple joints-arthritic  changes in back likely contribute to pain   Glaucoma, unspecified glaucoma type, unspecified laterality-follows with ophthalmology regularly- no recent issues. On drops. Discussed could use claritin/allegra/zyrtec before bed for rhinorrhea after meals but check with eye doctor first.    B12 deficiency-takes B12.  Update B12 level today   Diabetic polyneuropathy associated with type 2 diabetes mellitus (HCC)-not currently on treatment.  Stable, continue to monitor- he continues to look at  his feet daily   Mild intermittent ongoing morning congestion.  As above antihistamine may help  Recommended follow up: 6 months Future Appointments  Date Time Provider [place]  02/07/2020  1:00 PM [facility] 2 [facility] None  02/07/2020  1:30 PM Shadad, Mathis Dad, MD [facility] None   Lab/Order associations: fasting   ICD-10-CM   1. Preventative health care  Z00.00   2. Controlled type 2 diabetes mellitus with stage 2 chronic kidney disease, without long-term current use of insulin (HCC)  E11.22    N18.2   3. Chronic lymphocytic leukemia (HCC)  C91.10   4. Hyperlipidemia, unspecified hyperlipidemia type  E78.5   5. Idiopathic gout of multiple sites, unspecified chronicity  M10.09   6. Essential hypertension  I10   7. Stage 3 chronic kidney disease, unspecified whether stage 3a or 3b CKD  N18.30   8. Benign prostatic hyperplasia with nocturia  N40.1    R35.1   9. Spinal stenosis of lumbar region, unspecified whether neurogenic claudication present  M48.061   10. Primary osteoarthritis involving multiple joints  M89.49   11. Glaucoma, unspecified glaucoma type, unspecified laterality  H40.9   12. B12 deficiency  E53.8   13. Diabetic polyneuropathy associated with type 2 diabetes mellitus (HCC)  E11.42    No orders of the defined types were placed in this encounter.   Return precautions advised.  Garret Reddish, MD

## 2019-03-14 NOTE — Patient Instructions (Addendum)
Health Maintenance Due  Topic Date Due  . HEMOGLOBIN A1C will add to labs  05/26/2018  . OPHTHALMOLOGY EXAM we Will send roi for notes  06/13/2018   Please check with your pharmacy to see if they have the shingrix vaccine. If they do- please get this immunization and update [place] by phone call or mychart with dates you receive the vaccine   Discussed could use claritin/allegra/zyrtec before bed for rhinorrhea after meals but check with eye doctor first.    - I was honest with patient I am not sure much more can be done for the back. He asks about seeing a "muscle Specialist" - I told him we could get the opinion of Dr. Charlann Boxer but this referral would not be for pain management it would be for a second set of eyes to see if any other tweaks can be made to improve his back pain. We will try to get copy of records from Dr. Rita Ohara first so he can have those on hand.  - asked patient to call me back in 3 weeks to see if we have received  For Team- when we get these records- please place on my desk with a note about sports medicine referral.   Please stop by lab before you go If you do not have mychart- we will call you about results within 5 business days of [place] receiving them.  If you have mychart- we will send your results within 3 business days of [place] receiving them.  If abnormal or we want to clarify a result, we will call or mychart you to make sure you receive the message.  If you have questions or concerns or don't hear within 5-7 days, please send [place] a message or call us.

## 2019-03-14 NOTE — Addendum Note (Signed)
Addended by: Francis Dowse T on: 03/14/2019 04:10 PM   Modules accepted: Orders

## 2019-04-08 ENCOUNTER — Other Ambulatory Visit: Payer: Self-pay | Admitting: Family Medicine

## 2019-04-09 DIAGNOSIS — M5136 Other intervertebral disc degeneration, lumbar region: Secondary | ICD-10-CM | POA: Diagnosis not present

## 2019-04-09 DIAGNOSIS — M961 Postlaminectomy syndrome, not elsewhere classified: Secondary | ICD-10-CM | POA: Diagnosis not present

## 2019-04-09 DIAGNOSIS — M5416 Radiculopathy, lumbar region: Secondary | ICD-10-CM | POA: Diagnosis not present

## 2019-04-09 DIAGNOSIS — M48061 Spinal stenosis, lumbar region without neurogenic claudication: Secondary | ICD-10-CM | POA: Diagnosis not present

## 2019-04-09 DIAGNOSIS — Z981 Arthrodesis status: Secondary | ICD-10-CM | POA: Diagnosis not present

## 2019-04-17 DIAGNOSIS — E119 Type 2 diabetes mellitus without complications: Secondary | ICD-10-CM | POA: Diagnosis not present

## 2019-04-17 DIAGNOSIS — H401131 Primary open-angle glaucoma, bilateral, mild stage: Secondary | ICD-10-CM | POA: Diagnosis not present

## 2019-04-17 DIAGNOSIS — H2513 Age-related nuclear cataract, bilateral: Secondary | ICD-10-CM | POA: Diagnosis not present

## 2019-04-17 DIAGNOSIS — H524 Presbyopia: Secondary | ICD-10-CM | POA: Diagnosis not present

## 2019-04-30 ENCOUNTER — Telehealth: Payer: Self-pay

## 2019-04-30 NOTE — Telephone Encounter (Signed)
Copied from [place] [id number]. Topic: General - Inquiry >> Apr 30, 2019 12:51 PM Alease Frame wrote: Reason for CRM: Patient is wanting a call back from Dr Yong Channel regarding prior messages.   Call back number [phone]

## 2019-04-30 NOTE — Telephone Encounter (Signed)
Called and spoke with Dr. Lollie Sails office and they are sending the records.

## 2019-05-14 ENCOUNTER — Other Ambulatory Visit: Payer: Self-pay | Admitting: Family Medicine

## 2019-05-28 DIAGNOSIS — H401131 Primary open-angle glaucoma, bilateral, mild stage: Secondary | ICD-10-CM | POA: Diagnosis not present

## 2019-06-28 ENCOUNTER — Other Ambulatory Visit: Payer: Self-pay | Admitting: Family Medicine

## 2019-07-08 DIAGNOSIS — M961 Postlaminectomy syndrome, not elsewhere classified: Secondary | ICD-10-CM | POA: Diagnosis not present

## 2019-07-08 DIAGNOSIS — M47814 Spondylosis without myelopathy or radiculopathy, thoracic region: Secondary | ICD-10-CM | POA: Diagnosis not present

## 2019-07-08 DIAGNOSIS — Z79899 Other long term (current) drug therapy: Secondary | ICD-10-CM | POA: Diagnosis not present

## 2019-07-08 DIAGNOSIS — M461 Sacroiliitis, not elsewhere classified: Secondary | ICD-10-CM | POA: Diagnosis not present

## 2019-07-16 ENCOUNTER — Other Ambulatory Visit: Payer: Self-pay

## 2019-07-16 ENCOUNTER — Ambulatory Visit: Payer: Medicare Other | Admitting: Podiatry

## 2019-07-16 DIAGNOSIS — M79675 Pain in left toe(s): Secondary | ICD-10-CM

## 2019-07-16 DIAGNOSIS — M7989 Other specified soft tissue disorders: Secondary | ICD-10-CM

## 2019-07-16 DIAGNOSIS — L600 Ingrowing nail: Secondary | ICD-10-CM

## 2019-07-16 DIAGNOSIS — B351 Tinea unguium: Secondary | ICD-10-CM | POA: Diagnosis not present

## 2019-07-16 DIAGNOSIS — E1121 Type 2 diabetes mellitus with diabetic nephropathy: Secondary | ICD-10-CM

## 2019-07-16 DIAGNOSIS — M79674 Pain in right toe(s): Secondary | ICD-10-CM | POA: Diagnosis not present

## 2019-07-16 DIAGNOSIS — N1831 Chronic kidney disease, stage 3a: Secondary | ICD-10-CM

## 2019-07-16 MED ORDER — DOXYCYCLINE HYCLATE 100 MG PO TABS: 100.0000 mg | ORAL_TABLET | Freq: Two times a day (BID) | ORAL | 0 refills | Status: DC

## 2019-07-17 ENCOUNTER — Emergency Department
Admission: EM | Admit: 2019-07-17 | Discharge: 2019-07-17 | Disposition: A | Discharge status: Home / Self Care | Payer: Medicare Other | Attending: Emergency Medicine | Admitting: Emergency Medicine

## 2019-07-17 ENCOUNTER — Telehealth: Payer: Self-pay | Admitting: Family Medicine

## 2019-07-17 ENCOUNTER — Other Ambulatory Visit: Payer: Self-pay

## 2019-07-17 DIAGNOSIS — Z7984 Long term (current) use of oral hypoglycemic drugs: Secondary | ICD-10-CM | POA: Diagnosis not present

## 2019-07-17 DIAGNOSIS — Z87891 Personal history of nicotine dependence: Secondary | ICD-10-CM | POA: Diagnosis not present

## 2019-07-17 DIAGNOSIS — I129 Hypertensive chronic kidney disease with stage 1 through stage 4 chronic kidney disease, or unspecified chronic kidney disease: Secondary | ICD-10-CM | POA: Diagnosis not present

## 2019-07-17 DIAGNOSIS — N183 Chronic kidney disease, stage 3 unspecified: Secondary | ICD-10-CM | POA: Diagnosis not present

## 2019-07-17 DIAGNOSIS — R202 Paresthesia of skin: Secondary | ICD-10-CM | POA: Insufficient documentation

## 2019-07-17 DIAGNOSIS — R631 Polydipsia: Secondary | ICD-10-CM | POA: Diagnosis present

## 2019-07-17 DIAGNOSIS — E1165 Type 2 diabetes mellitus with hyperglycemia: Secondary | ICD-10-CM | POA: Diagnosis not present

## 2019-07-17 DIAGNOSIS — Z856 Personal history of leukemia: Secondary | ICD-10-CM | POA: Insufficient documentation

## 2019-07-17 DIAGNOSIS — Z79899 Other long term (current) drug therapy: Secondary | ICD-10-CM | POA: Diagnosis not present

## 2019-07-17 DIAGNOSIS — E860 Dehydration: Secondary | ICD-10-CM | POA: Diagnosis not present

## 2019-07-17 DIAGNOSIS — E1122 Type 2 diabetes mellitus with diabetic chronic kidney disease: Secondary | ICD-10-CM | POA: Diagnosis not present

## 2019-07-17 LAB — BASIC METABOLIC PANEL
Anion gap: 12 (ref 5–15)
BUN: 40 mg/dL — ABNORMAL HIGH (ref 8–23)
CO2: 18 mmol/L — ABNORMAL LOW (ref 22–32)
Calcium: 9.7 mg/dL (ref 8.9–10.3)
Chloride: 104 mmol/L (ref 98–111)
Creatinine, Ser: 1.82 mg/dL — ABNORMAL HIGH (ref 0.61–1.24)
GFR calc Af Amer: 38 mL/min — ABNORMAL LOW (ref >60)
GFR calc non Af Amer: 33 mL/min — ABNORMAL LOW (ref >60)
Glucose, Bld: 578 mg/dL (ref 70–99)
Potassium: 4.9 mmol/L (ref 3.5–5.1)
Sodium: 134 mmol/L — ABNORMAL LOW (ref 135–145)

## 2019-07-17 LAB — URINALYSIS, COMPLETE (UACMP) WITH MICROSCOPIC
Bacteria, UA: NONE SEEN
Bilirubin Urine: NEGATIVE
Glucose, UA: >=500 mg/dL — AB
Hgb urine dipstick: NEGATIVE
Ketones, ur: NEGATIVE mg/dL
Leukocytes,Ua: NEGATIVE
Nitrite: NEGATIVE
Protein, ur: NEGATIVE mg/dL
Specific Gravity, Urine: 1.014 (ref 1.005–1.030)
pH: 5 (ref 5.0–8.0)

## 2019-07-17 LAB — CBC
HCT: 40.2 % (ref 39.0–52.0)
Hemoglobin: 13.5 g/dL (ref 13.0–17.0)
MCH: 29.6 pg (ref 26.0–34.0)
MCHC: 33.6 g/dL (ref 30.0–36.0)
MCV: 88.2 fL (ref 80.0–100.0)
Platelets: 169 10*3/uL (ref 150–400)
RBC: 4.56 MIL/uL (ref 4.22–5.81)
RDW: 13 % (ref 11.5–15.5)
WBC: 22.9 10*3/uL — ABNORMAL HIGH (ref 4.0–10.5)
nRBC: 0.1 % (ref 0.0–0.2)

## 2019-07-17 LAB — GLUCOSE, CAPILLARY
Glucose-Capillary: 534 mg/dL (ref 70–99)
Glucose-Capillary: 287 mg/dL — ABNORMAL HIGH (ref 70–99)

## 2019-07-17 MED ORDER — SODIUM CHLORIDE 0.9 % IV BOLUS
1000.0000 mL | Freq: Once | INTRAVENOUS | Status: AC
  Administered 2019-07-17: 1000 mL via INTRAVENOUS

## 2019-07-17 MED ORDER — INSULIN ASPART 100 UNIT/ML ~~LOC~~ SOLN
10.0000 [IU] | Freq: Once | SUBCUTANEOUS | Status: AC
  Administered 2019-07-17: 10 [IU] via INTRAVENOUS
  Filled 2019-07-17: qty 1

## 2019-07-17 NOTE — ED Triage Notes (Signed)
Pt comes POV with tingling in both hands, drinking and urinating more than normal, and not feeling well for about 4 days. Pt is a known diabetic.

## 2019-07-17 NOTE — Telephone Encounter (Signed)
Nurse Assessment Nurse: Juleen China, RN, Butch Penny Date/Time Eilene Ghazi Time): 07/17/2019 1:18:25 PM Confirm and document reason for call. If symptomatic, describe symptoms. ---Caller is having tingling in finger tips mainly on the right side but is in both hands. Increased thirst and has increase in urine output. Started 3 days ago. Tingling is increasing. Blood sugar has been in 130-140. Did not take today. No fever, cough or SOB. Has the patient had close contact with a person known or suspected to have the novel coronavirus illness OR traveled / lives in area with major community spread (including international travel) in the last 14 days from the onset of symptoms? * If Asymptomatic, screen for exposure and travel within the last 14 days. ---No Does the patient have any new or worsening symptoms? ---Yes Will a triage be completed? ---Yes Related visit to physician within the last 2 weeks? ---No Does the PT have any chronic conditions? (i.e. diabetes, asthma, this includes High risk factors for pregnancy, etc.) ---Yes List chronic conditions. ---Diabetes HTN Is this a behavioral health or substance abuse call? ---No Guidelines Guideline Title Affirmed Question Affirmed Notes Nurse Date/Time Eilene Ghazi Time) Neurologic Deficit [1] Loss of speech or garbled speech AND [2] Criselda Peaches 07/17/2019 1:22:53 PM PLEASE NOTE: All timestamps contained within this report are represented as [place] Standard Time. CONFIDENTIALTY NOTICE: This fax transmission is intended only for the addressee. It contains information that is legally privileged, confidential or otherwise protected from use or disclosure. If you are not the intended recipient, you are strictly prohibited from reviewing, disclosing, copying using or disseminating any of this information or taking any action in reliance on or regarding this information. If you have received this fax in error, please notify us immediately by telephone so  that we can arrange for its return to [place]. Phone: [phone], Toll-Free: [phone], Fax: [phone] Page: 2 of 2 Call Id: [id number] Guidelines Guideline Title Affirmed Question Affirmed Notes Nurse Date/Time Eilene Ghazi Time) sudden onset AND [3] brief (now gone) Disp. Time Eilene Ghazi Time) Disposition Final User 07/17/2019 1:17:21 PM Send to Urgent Queue Kelby Aline 07/17/2019 1:32:05 PM Go to ED Now Yes Juleen China, RN, Butch Penny Disposition Overriden: Go to ED Now (or PCP triage) Override Reason: Patient's symptoms need a higher level of care Caller Disagree/Comply Comply Caller Understands Yes PreDisposition Call Doctor Care Advice Given Per Guideline GO TO ED NOW: * You need to be seen in the Emergency Department. * Go to the ED at ___________ [place] now. Drive carefully. NOTE TO TRIAGER - DRIVING: * Another adult should drive. * If immediate transportation is not available via car or taxi, then the patient should be instructed to call [id number]. CARE ADVICE given per Neurologic Deficit (Adult) guideline. ANOTHER ADULT SHOULD DRIVE: * It is better and safer if another adult drives instead of you.

## 2019-07-17 NOTE — ED Provider Notes (Signed)
[facility] Emergency Department Provider Note  ____________________________________________  Time seen: Approximately 5:37 PM  I have reviewed the triage vital signs and the nursing notes.   HISTORY  Chief Complaint Numbness (bilateral), Polydipsia, and Polyuria    HPI Khyir Sowells is a 86 y.o. male with a history of diabetes, hypertension, chronic leukemia who comes to the ED complaining of tingling in all 10 fingers, excessive thirst, frequent clear urination, malaise for the past 4 days.  Gradual onset.  He notes that a week ago he had a spinal injection for his chronic low back pain and radiculopathy.  He denies any changes to his medications recently or oral steroid use.  Notes that his blood sugar has been running high for several days.  Symptoms are constant, no aggravating or alleviating factors.  Denies any dysuria cough shortness of breath chest pain fever chills body aches or other acute symptoms.      Past Medical History:  Diagnosis Date  . Acute bronchitis 09/12/2007   Qualifier: Diagnosis of  By: [name] MD, [name]   . Diabetes mellitus type 2 with complications ([place]) 0000000  . Glaucoma    left eye  . Gout   . Hyperlipidemia   . Hypertension   . Leukemia, chronic ([place]) 2001   does not take treatment just has blood levels checked once a year     Patient Active Problem List   Diagnosis Date Noted  . Enteritis due to Clostridium difficile   . Discitis of lumbar region   . History of osteomyelitis of lumbar vertebrae   . Hyperlipidemia   . CKD (chronic kidney disease), stage III 01/13/2015  . Lumbar spinal stenosis 06/26/2014  . BPH (benign prostatic hyperplasia) 06/24/2014  . B12 deficiency 12/12/2008  . Diabetic polyneuropathy ([place]) 12/12/2008  . ERECTILE DYSFUNCTION 03/18/2008  . Chronic lymphocytic leukemia ([place]) 03/08/2007  . Diabetes mellitus type II, controlled ([place]) 03/08/2007  . Gout 03/08/2007  . Glaucoma 03/08/2007  .  Essential hypertension 03/08/2007  . Osteoarthritis 03/08/2007     Past Surgical History:  Procedure Laterality Date  . ANTERIOR LAT LUMBAR FUSION Left 01/03/2013   Procedure: ANTERIOR LATERAL LUMBAR FUSION lumbar three-four;  Surgeon: [name], MD;  Location: [place] [facility];  Service: Neurosurgery;  Laterality: Left;  . BACK SURGERY     lumbar fusion - 2016, feb  . COLONOSCOPY W/ POLYPECTOMY    . DENTAL SURGERY     implanted teeth x 2  . HERNIA REPAIR  1989  . LUMBAR LAMINECTOMY  1997  . LUMBAR PERCUTANEOUS PEDICLE SCREW 1 LEVEL  01/03/2013   Procedure: LUMBAR PERCUTANEOUS PEDICLE SCREWS LUMBAR THREE-FOUR;  Surgeon: [name], MD;  Location: [place] [facility];  Service: Neurosurgery;;  . LUMBAR WOUND DEBRIDEMENT N/A 07/23/2014   Procedure: LUMBAR WOUND DEBRIDEMENT;  Surgeon: [name], MD;  Location: [place] [facility]UR[facility];  Service: Neurosurgery;  Laterality: N/A;  . TONSILLECTOMY       Prior to Admission medications   Medication Sig Start Date End Date Taking? Authorizing Provider  acetaminophen (TYLENOL) 500 MG tablet Take 500-1,000 mg by mouth every 6 (six) hours as needed (pain). Reported on 07/15/2015    [provider]  atorvastatin (LIPITOR) 10 MG tablet TAKE 1 TABLET BY MOUTH ONCE WEEKLY 05/15/19   [name], MD  doxycycline (VIBRA-TABS) 100 MG tablet Take 1 tablet (100 mg total) by mouth 2 (two) times daily. 07/16/19   Trula Slade, DPM  finasteride (PROSCAR) 5 MG  tablet TAKE 1 TABLET BY MOUTH EVERY DAY 07/04/18   [name], MD  glipiZIDE (GLUCOTROL XL) 5 MG 24 hr tablet TAKE 1 TABLET BY MOUTH EVERY DAY 01/31/19   [name], MD  glucose blood (ONETOUCH VERIO) test strip Use to test blood sugars daily. Dx: E11.9 02/08/19   [name], MD  HYDROcodone-acetaminophen (NORCO/VICODIN) 5-325 MG tablet Take 1-2 tablets by mouth every 6 (six) hours as needed. 07/21/17   [provider]  losartan (COZAAR) 100 MG tablet TAKE 1 TABLET BY MOUTH   DAILY 07/01/19   [name], MD  ONE TOUCH LANCETS MISC Use to test blood sugars. Dx:E11.9 08/18/14   [name], MD  tamsulosin (FLOMAX) 0.4 MG CAPS capsule TAKE 1 CAPSULE BY MOUTH  DAILY 04/09/19   [name], MD  timolol (TIMOPTIC) 0.5 % ophthalmic solution 1 drop 2 (two) times daily. 06/05/19   [provider]  TRAVATAN Z 0.004 % SOLN ophthalmic solution Place 1 drop into the left eye at bedtime.  09/03/14   [provider]  vitamin B-12 (CYANOCOBALAMIN) 1000 MCG tablet Take 1,000 mcg by mouth daily.    [provider]     Allergies Patient has no known allergies.   Family History  Problem Relation Age of Onset  . Heart disease Mother        60, second hand  . Heart disease Father        70, former smoker    Social History Social History   Tobacco Use  . Smoking status: Former Smoker    Packs/day: 0.25    Years: 20.00    Pack years: 5.00    Types: Cigarettes    Quit date: 09/28/1995    Years since quitting: 23.8  . Smokeless tobacco: Never Used  Substance Use Topics  . Alcohol use: Yes    Alcohol/week: 1.0 standard drinks    Types: 1 Glasses of wine per week    Comment: rarely  . Drug use: No    Review of Systems  Constitutional:   No fever or chills.  ENT:   No sore throat. No rhinorrhea. Cardiovascular:   No chest pain or syncope. Respiratory:   No dyspnea or cough. Gastrointestinal:   Negative for abdominal pain, vomiting and diarrhea.  Musculoskeletal:   Negative for focal pain or swelling All other systems reviewed and are negative except as documented above in ROS and HPI.  ____________________________________________   PHYSICAL EXAM:  VITAL SIGNS: ED Triage Vitals [07/17/19 1408]  Enc Vitals Group     BP 121/69     Pulse Rate 79     Resp 18     Temp 98.3 F (36.8 C)     Temp Source Oral     SpO2 98 %     Weight 245 lb (111.1 kg)     Height 6' (1.829 m)     Head Circumference      Peak Flow       Pain Score 0     Pain Loc      Pain Edu?      Excl. in [place]?     Vital signs reviewed, nursing assessments reviewed.   Constitutional:   Alert and oriented. Non-toxic appearance. Eyes:   Conjunctivae are normal. EOMI. PERRL. ENT      Head:   Normocephalic and atraumatic.      Nose:   Wearing a mask.      Mouth/Throat:  Wearing a mask.      Neck:   No meningismus. Full ROM. Hematological/Lymphatic/Immunilogical:   No cervical lymphadenopathy. Cardiovascular:   RRR. Symmetric bilateral radial and DP pulses.  No murmurs. Cap refill less than 2 seconds. Respiratory:   Normal respiratory effort without tachypnea/retractions. Breath sounds are clear and equal bilaterally. No wheezes/rales/rhonchi. Gastrointestinal:   Soft and nontender. Non distended. There is no CVA tenderness.  No rebound, rigidity, or guarding. Musculoskeletal:   Normal range of motion in all extremities. No joint effusions.  No lower extremity tenderness.  No edema. Neurologic:   Normal speech and language.  Motor grossly intact. No acute focal neurologic deficits are appreciated.  Skin:    Skin is warm, dry and intact. No rash noted.  No petechiae, purpura, or bullae.  ____________________________________________    LABS (pertinent positives/negatives) (all labs ordered are listed, but only abnormal results are displayed) Labs Reviewed  GLUCOSE, CAPILLARY - Abnormal; Notable for the following components:      Result Value   Glucose-Capillary 534 (*)    All other components within normal limits  BASIC METABOLIC PANEL - Abnormal; Notable for the following components:   Sodium 134 (*)    CO2 18 (*)    Glucose, Bld 578 (*)    BUN 40 (*)    Creatinine, Ser 1.82 (*)    GFR calc non Af Amer 33 (*)    GFR calc Af Amer 38 (*)    All other components within normal limits  CBC - Abnormal; Notable for the following components:   WBC 22.9 (*)    All other components within normal limits  URINALYSIS, COMPLETE (UACMP)  WITH MICROSCOPIC  CBG MONITORING, ED  CBG MONITORING, ED   ____________________________________________   EKG    ____________________________________________    RADIOLOGY  No results found.  ____________________________________________   PROCEDURES Procedures  ____________________________________________    CLINICAL IMPRESSION / ASSESSMENT AND PLAN / ED COURSE  Medications ordered in the ED: Medications  insulin aspart (novoLOG) injection 10 Units (has no administration in time range)  sodium chloride 0.9 % bolus 1,000 mL (has no administration in time range)  sodium chloride 0.9 % bolus 1,000 mL (1,000 mLs Intravenous New Bag/Given 07/17/19 1420)    Pertinent labs & imaging results that were available during my care of the patient were reviewed by me and considered in my medical decision making (see chart for details).  Artrell Matto was evaluated in Emergency Department on 07/17/2019 for the symptoms described in the history of present illness. He was evaluated in the context of the global COVID-19 pandemic, which necessitated consideration that the patient might be at risk for infection with the SARS-CoV-2 virus that causes COVID-19. Institutional protocols and algorithms that pertain to the evaluation of patients at risk for COVID-19 are in a state of rapid change based on information released by regulatory bodies including the CDC and federal and state organizations. These policies and algorithms were followed during the patient's care in the ED.   Patient presents with polyuria, polydipsia in the setting of hyperglycemia with a blood sugar of almost 600.  No acidosis on labs.  Most likely this is precipitated by paraspinal injection that commonly includes steroid, although I am unable to find the procedure note in the electronic medical record.  Vital signs are normal, patient is nontoxic, exam is reassuring.  Doubt UTI, pneumonia, sepsis, skin or soft tissue infection,  or other acute illness.  He has a leukocytosis of 22,000 which I  think is due to these demargination effect of steroid which has also caused hyperglycemia.  Will give IV fluids for hydration, IV aspart insulin for glycemic control, reassess.   ----------------------------------------- 7:05 PM on 07/17/2019 -----------------------------------------  Vitals remain normal.  Urinalysis normal.  Repeat glucose 287 after hydration and aspart.  Stable for discharge home.  Counseled on continuing to watch his blood sugar and drink lots of fluids to stay hydrated, follow-up with primary care in the next few days.      ____________________________________________   FINAL CLINICAL IMPRESSION(S) / ED DIAGNOSES    Final diagnoses:  Type 2 diabetes mellitus with hyperglycemia, without long-term current use of insulin ([place])  Dehydration  Paresthesias     ED Discharge Orders    None      Portions of this note were generated with dragon dictation software. Dictation errors may occur despite best attempts at proofreading.   Carrie Mew, MD 07/17/19 1905

## 2019-07-18 NOTE — Telephone Encounter (Signed)
Patient called in this afternoon saying he was advised to go to the ED, after having a work up they advised that he follow up with [facility] as soon as possible. Is it okay to schedule patient for Same Day on Tuesday, or can patient be checked in sooner.

## 2019-07-18 NOTE — Telephone Encounter (Signed)
FYI.the patient went to ED.

## 2019-07-18 NOTE — Telephone Encounter (Signed)
SDA is fine, if he needs sooner he will have to see one of the other providers.

## 2019-07-21 NOTE — Progress Notes (Signed)
Subjective:   Patient ID: Kevin Rogers, male   DOB: 86 y.o.   MRN: [id number]   HPI 86 year old male presents the office today requesting his nails be trimmed as they are thick and elongated he has also noticed some swelling on the left second toe and he points well medial nail border where he has noticed some ingrowing of the nail.  He previously bloody drainage but no pus.  Currently denies any drainage and no redness or any red streaks.  No recent injury.   Review of Systems  All other systems reviewed and are negative.  Past Medical History:  Diagnosis Date  . Acute bronchitis 09/12/2007   Qualifier: Diagnosis of  By: [name] MD, [name]   . Diabetes mellitus type 2 with complications ([place]) 0000000  . Glaucoma    left eye  . Gout   . Hyperlipidemia   . Hypertension   . Leukemia, chronic ([place]) 2001   does not take treatment just has blood levels checked once a year    Past Surgical History:  Procedure Laterality Date  . ANTERIOR LAT LUMBAR FUSION Left 01/03/2013   Procedure: ANTERIOR LATERAL LUMBAR FUSION lumbar three-four;  Surgeon: [name], MD;  Location: [place] [facility]UR[facility];  Service: Neurosurgery;  Laterality: Left;  . BACK SURGERY     lumbar fusion - 2016, feb  . COLONOSCOPY W/ POLYPECTOMY    . DENTAL SURGERY     implanted teeth x 2  . HERNIA REPAIR  1989  . LUMBAR LAMINECTOMY  1997  . LUMBAR PERCUTANEOUS PEDICLE SCREW 1 LEVEL  01/03/2013   Procedure: LUMBAR PERCUTANEOUS PEDICLE SCREWS LUMBAR THREE-FOUR;  Surgeon: [name], MD;  Location: [place] [facility];  Service: Neurosurgery;;  . LUMBAR WOUND DEBRIDEMENT N/A 07/23/2014   Procedure: LUMBAR WOUND DEBRIDEMENT;  Surgeon: [name], MD;  Location: [place] [facility]UR[facility] ORS;  Service: Neurosurgery;  Laterality: N/A;  . TONSILLECTOMY       Current Outpatient Medications:  .  acetaminophen (TYLENOL) 500 MG tablet, Take 500-1,000 mg by mouth every 6 (six) hours as needed (pain). Reported on 07/15/2015, Disp: , Rfl:  .  atorvastatin  (LIPITOR) 10 MG tablet, TAKE 1 TABLET BY MOUTH ONCE WEEKLY, Disp: 13 tablet, Rfl: 3 .  finasteride (PROSCAR) 5 MG tablet, TAKE 1 TABLET BY MOUTH EVERY DAY, Disp: 90 tablet, Rfl: 3 .  glipiZIDE (GLUCOTROL XL) 5 MG 24 hr tablet, TAKE 1 TABLET BY MOUTH EVERY DAY, Disp: 90 tablet, Rfl: 1 .  glucose blood (ONETOUCH VERIO) test strip, Use to test blood sugars daily. Dx: E11.9, Disp: 100 each, Rfl: 12 .  HYDROcodone-acetaminophen (NORCO/VICODIN) 5-325 MG tablet, Take 1-2 tablets by mouth every 6 (six) hours as needed., Disp: , Rfl:  .  losartan (COZAAR) 100 MG tablet, TAKE 1 TABLET BY MOUTH  DAILY, Disp: 90 tablet, Rfl: 3 .  ONE TOUCH LANCETS MISC, Use to test blood sugars. Dx:E11.9, Disp: 100 each, Rfl: 11 .  tamsulosin (FLOMAX) 0.4 MG CAPS capsule, TAKE 1 CAPSULE BY MOUTH  DAILY, Disp: 90 capsule, Rfl: 3 .  timolol (TIMOPTIC) 0.5 % ophthalmic solution, 1 drop 2 (two) times daily., Disp: , Rfl:  .  TRAVATAN Z 0.004 % SOLN ophthalmic solution, Place 1 drop into the left eye at bedtime. , Disp: , Rfl:  .  vitamin B-12 (CYANOCOBALAMIN) 1000 MCG tablet, Take 1,000 mcg by mouth daily., Disp: , Rfl:  .  doxycycline (VIBRA-TABS) 100 MG tablet, Take 1 tablet (100 mg total) by mouth 2 (two)  times daily., Disp: 20 tablet, Rfl: 0  No Known Allergies       Objective:  Physical Exam  General: AAO x3, NAD  Dermatological: Nails are hypertrophic, dystrophic, brittle, discolored, elongated 10.  Particular along the left medial second digit toenail there is incurvation there is thickening the nail the distal needle nail border.  This is where he gets discomfort.  Upon debridement there is no ongoing ulceration and there is no drainage or pus.  There is no signs of abscess or infection today.  No surrounding redness or drainage. Tenderness nails 1-5 bilaterally. No open lesions or pre-ulcerative lesions are identified today.   Vascular: DP pulses 2/4, PT pulses 1/4 there is no pain with calf compression, swelling,  warmth, erythema.   Neruologic: Grossly intact via light touch bilateral. Vibratory intact via tuning fork bilateral. Protective threshold with Semmes Wienstein monofilament intact to all pedal sites bilateral. Patellar and Achilles deep tendon reflexes 2+ bilateral. No Babinski or clonus noted bilateral.   Musculoskeletal: No gross boney pedal deformities bilateral.   Gait: Unassisted, Nonantalgic.       Assessment:   Symptomatic onychomycosis, ingrown toenail; type 2 diabetes    Plan:  -Treatment options discussed including all alternatives, risks, and complications -Etiology of symptoms were discussed -Discussed in regards to the ingrowing of the nail partial nail avulsion versus debridement.  Today I debrided the nail with any complications or bleeding.  Prescribe doxycycline given the edema.  There is no drainage or pus but continue monitor closely.  If symptoms continue he will need a partial nail avulsion.  I debrided the other nails x9 without any complications or bleeding as well.  Trula Slade DPM

## 2019-07-22 ENCOUNTER — Encounter: Payer: Self-pay | Admitting: Family Medicine

## 2019-07-22 ENCOUNTER — Other Ambulatory Visit: Payer: Medicare Other

## 2019-07-22 ENCOUNTER — Other Ambulatory Visit: Payer: Self-pay

## 2019-07-22 ENCOUNTER — Ambulatory Visit (INDEPENDENT_AMBULATORY_CARE_PROVIDER_SITE_OTHER): Payer: Medicare Other | Admitting: Family Medicine

## 2019-07-22 VITALS — BP 118/74 | HR 78 | Temp 97.2°F | Ht 72.0 in | Wt 239.8 lb

## 2019-07-22 DIAGNOSIS — E785 Hyperlipidemia, unspecified: Secondary | ICD-10-CM

## 2019-07-22 DIAGNOSIS — C9110 Chronic lymphocytic leukemia of B-cell type not having achieved remission: Secondary | ICD-10-CM

## 2019-07-22 DIAGNOSIS — I10 Essential (primary) hypertension: Secondary | ICD-10-CM | POA: Diagnosis not present

## 2019-07-22 DIAGNOSIS — E1122 Type 2 diabetes mellitus with diabetic chronic kidney disease: Secondary | ICD-10-CM | POA: Diagnosis not present

## 2019-07-22 DIAGNOSIS — R7309 Other abnormal glucose: Secondary | ICD-10-CM

## 2019-07-22 DIAGNOSIS — N182 Chronic kidney disease, stage 2 (mild): Secondary | ICD-10-CM

## 2019-07-22 LAB — CBC WITH DIFFERENTIAL/PLATELET
Basophils Absolute: 0.1 10*3/uL (ref 0.0–0.1)
Basophils Relative: 0.3 % (ref 0.0–3.0)
Eosinophils Absolute: 0.1 10*3/uL (ref 0.0–0.7)
Eosinophils Relative: 0.3 % (ref 0.0–5.0)
HCT: 40.8 % (ref 39.0–52.0)
Hemoglobin: 13.2 g/dL (ref 13.0–17.0)
Lymphocytes Relative: 74.7 % — ABNORMAL HIGH (ref 12.0–46.0)
Lymphs Abs: 18.7 10*3/uL — ABNORMAL HIGH (ref 0.7–4.0)
MCHC: 32.4 g/dL (ref 30.0–36.0)
MCV: 91.6 fl (ref 78.0–100.0)
Monocytes Absolute: 0.4 10*3/uL (ref 0.1–1.0)
Monocytes Relative: 1.6 % — ABNORMAL LOW (ref 3.0–12.0)
Neutro Abs: 5.8 10*3/uL (ref 1.4–7.7)
Neutrophils Relative %: 23.1 % — ABNORMAL LOW (ref 43.0–77.0)
Platelets: 158 10*3/uL (ref 150.0–400.0)
RBC: 4.45 Mil/uL (ref 4.22–5.81)
RDW: 13.7 % (ref 11.5–15.5)
WBC: 25 10*3/uL (ref 4.0–10.5)

## 2019-07-22 LAB — COMPREHENSIVE METABOLIC PANEL
ALT: 14 U/L (ref 0–53)
AST: 11 U/L (ref 0–37)
Albumin: 3.9 g/dL (ref 3.5–5.2)
Alkaline Phosphatase: 93 U/L (ref 39–117)
BUN: 34 mg/dL — ABNORMAL HIGH (ref 6–23)
CO2: 22 mEq/L (ref 19–32)
Calcium: 9.7 mg/dL (ref 8.4–10.5)
Chloride: 102 mEq/L (ref 96–112)
Creatinine, Ser: 1.63 mg/dL — ABNORMAL HIGH (ref 0.40–1.50)
GFR: 48.85 mL/min — ABNORMAL LOW (ref >60.00)
Glucose, Bld: 479 mg/dL — ABNORMAL HIGH (ref 70–99)
Potassium: 5.2 mEq/L — ABNORMAL HIGH (ref 3.5–5.1)
Sodium: 133 mEq/L — ABNORMAL LOW (ref 135–145)
Total Bilirubin: 1.4 mg/dL — ABNORMAL HIGH (ref 0.2–1.2)
Total Protein: 6.4 g/dL (ref 6.0–8.3)

## 2019-07-22 NOTE — Patient Instructions (Addendum)
  Poor control of diabetes- likely related to recent steroid injection. We are going to use samples of long acting insulin to try to bring this down short term. He is going to start 5 units of Tresiba this morning and tomorrow morning then update me with fasting blood sugars (sugar when you wake up)- on wednesdays and we can adjust up if needed. He also knows that as steroid starts to come out of system we may need to stop insulin completely- if he starts getting any sugars under 140 in the morning he will stop the insulin and let us know.   No other changes in medications on last labs lead [place] to make medication changes  Please stop by lab before you go If you do not have mychart- we will call you about results within 5 business days of [place] receiving them.  If you have mychart- we will send your results within 3 business days of [place] receiving them.  If abnormal or we want to clarify a result, we will call or mychart you to make sure you receive the message.  If you have questions or concerns or don't hear within 5 business days, please send [place] a message or call us.

## 2019-07-22 NOTE — Progress Notes (Signed)
Phone [phone] In person visit   Subjective:   Kevin Rogers is a 86 y.o. year old very pleasant male patient who presents for/with See problem oriented charting Chief Complaint  Patient presents with  . Follow-up  . ED f/u   This visit occurred during the SARS-CoV-2 public health emergency.  Safety protocols were in place, including screening questions prior to the visit, additional usage of staff PPE, and extensive cleaning of exam room while observing appropriate contact time as indicated for disinfecting solutions.   Past Medical History-  Patient Active Problem List   Diagnosis Date Noted  . Chronic lymphocytic leukemia ([place]) 03/08/2007    Priority: High  . Diabetes mellitus type II, controlled ([place]) 03/08/2007    Priority: High  . History of osteomyelitis of lumbar vertebrae     Priority: Medium  . Hyperlipidemia     Priority: Medium  . CKD (chronic kidney disease), stage III 01/13/2015    Priority: Medium  . Lumbar spinal stenosis 06/26/2014    Priority: Medium  . BPH (benign prostatic hyperplasia) 06/24/2014    Priority: Medium  . Gout 03/08/2007    Priority: Medium  . Essential hypertension 03/08/2007    Priority: Medium  . Enteritis due to Clostridium difficile     Priority: Low  . Discitis of lumbar region     Priority: Low  . B12 deficiency 12/12/2008    Priority: Low  . Diabetic polyneuropathy ([place]) 12/12/2008    Priority: Low  . ERECTILE DYSFUNCTION 03/18/2008    Priority: Low  . Glaucoma 03/08/2007    Priority: Low  . Osteoarthritis 03/08/2007    Priority: Low    Medications- reviewed and updated Current Outpatient Medications  Medication Sig Dispense Refill  . acetaminophen (TYLENOL) 500 MG tablet Take 500-1,000 mg by mouth every 6 (six) hours as needed (pain). Reported on 07/15/2015    . atorvastatin (LIPITOR) 10 MG tablet TAKE 1 TABLET BY MOUTH ONCE WEEKLY 13 tablet 3  . doxycycline (VIBRA-TABS) 100 MG tablet Take 1 tablet (100 mg total) by  mouth 2 (two) times daily. 20 tablet 0  . finasteride (PROSCAR) 5 MG tablet TAKE 1 TABLET BY MOUTH EVERY DAY 90 tablet 3  . glipiZIDE (GLUCOTROL XL) 5 MG 24 hr tablet TAKE 1 TABLET BY MOUTH EVERY DAY 90 tablet 1  . glucose blood (ONETOUCH VERIO) test strip Use to test blood sugars daily. Dx: E11.9 100 each 12  . HYDROcodone-acetaminophen (NORCO/VICODIN) 5-325 MG tablet Take 1-2 tablets by mouth every 6 (six) hours as needed.    [name] losartan (COZAAR) 100 MG tablet TAKE 1 TABLET BY MOUTH  DAILY 90 tablet 3  . ONE TOUCH LANCETS MISC Use to test blood sugars. Dx:E11.9 100 each 11  . tamsulosin (FLOMAX) 0.4 MG CAPS capsule TAKE 1 CAPSULE BY MOUTH  DAILY 90 capsule 3  . timolol (TIMOPTIC) 0.5 % ophthalmic solution 1 drop 2 (two) times daily.    . TRAVATAN Z 0.004 % SOLN ophthalmic solution Place 1 drop into the left eye at bedtime.     . vitamin B-12 (CYANOCOBALAMIN) 1000 MCG tablet Take 1,000 mcg by mouth daily.     No current facility-administered medications for this visit.     Objective:  BP 118/74   Pulse 78   Temp (!) 97.2 F (36.2 C)   Ht 6' (1.829 m)   Wt 239 lb 12.8 oz (108.8 kg)   SpO2 96%   BMI 32.52 kg/m  Gen: NAD, resting comfortably CV: RRR no  murmurs rubs or gallops Lungs: CTAB no crackles, wheeze, rhonchi Ext: Trace edema Skin: warm, dry Neuro: Walks with walker    Assessment and Plan  #ED f/u for diabetes-worsening blood sugars even over 500 after recent steroid injection S: pt was seen in ED on 07/17/19 for diabetes and dehydration- numbers went up after steroid injection with neurosurgery it sounds like. Pt states he feels good but his sugars have not come down that much and they are still fluctuating.   Patient is compliant with glipizide 5 mg daily. #s went up suddenly after injection- before injection on once a week checks was around 135-140  He states he is trying to eat better since #s have been up.   Over last 4 days fasting sugarss 311, 385, 368, 367, 377  and nonfasting #s 440-437.  Lab Results  Component Value Date   HGBA1C 7.0 (H) 03/14/2019  A/P: Poor control of diabetes- likely related to recent steroid injection. We are going to use samples of long acting insulin to try to bring this down short term. He is going to start 5 units of Tresiba this morning and tomorrow morning then update me with fasting blood sugars (sugar when you wake up)- on wednesdays and we can adjust up if needed. He also knows that as steroid starts to come out of system we may need to stop insulin completely- if he starts getting any sugars under 140 in the morning he will stop the insulin and let us know.  -also had ingrown toenail procedure whiech could have bene stressor- on doxycycline -Also asked him to let me know if has future steroid injections due to potential flux in blood sugars -Slight increase in creatinine during ED trip-update this today  #hyperlipidemia S: compliant with atorvastatin 10 mg once a weke Lab Results  Component Value Date   CHOL 182 03/14/2019   HDL 51.90 03/14/2019   LDLCALC 105 (H) 03/14/2019   LDLDIRECT 76.0 11/23/2017   TRIG 127.0 03/14/2019   CHOLHDL 4 03/14/2019   A/P: slight poor control but at age 89 we have not felt strongly about increasing dose- likely would only do for secondary prevention or if LDL gets above 130 perhaps  #hypertension S: compliant with losartan 100mg - just a half tablet BP Readings from Last 3 Encounters:  07/22/19 118/74  07/17/19 133/81  03/14/19 128/70  A/P: controlled on losartan 50mg  on repeat today  #leukocytosis was worse in ER- likely from stress de margination- he has baseline known CLL and we are hoping #s are stable or slightly down.   Recommended follow up: Already scheduled for May-may need to see each other sooner potentially Future Appointments  Date Time Provider [place]  07/30/2019 10:00 AM Trula Slade, DPM [facility]  09/12/2019  8:00 AM [name], MD [facility]  02/07/2020  1:00 PM [facility] LAB 2 [facility] None  02/07/2020  1:30 PM Wyatt Portela, MD [facility] None    Lab/Order associations:   ICD-10-CM   1. Controlled type 2 diabetes mellitus with stage 2 chronic kidney disease, without long-term current use of insulin ([facility])  E11.22 CBC with Differential/Platelet   N18.2 Comprehensive metabolic panel    Hemoglobin A1c  2. Essential hypertension  I10   3. Hyperlipidemia, unspecified hyperlipidemia type  E78.5   4. Chronic lymphocytic leukemia ([place])  C91.10     Return precautions advised.  Garret Reddish, MD

## 2019-07-23 LAB — HEMOGLOBIN A1C
Hgb A1c MFr Bld: 10.9 % of total Hgb — ABNORMAL HIGH (ref <5.7)
Mean Plasma Glucose: 266 (calc)
eAG (mmol/L): 14.7 (calc)

## 2019-07-23 NOTE — Progress Notes (Signed)
Notes from Dr. Yong Channel We will try to gradually bring this number down-please increase to 7 units tomorrow morning.  Let us know if any blood sugars below 80 but I strongly doubt that will happen

## 2019-07-23 NOTE — Progress Notes (Signed)
Notes from Dr. Yong Channel A1c is very high at 10.9 meaning average blood sugar over last 3 months is likely around 270. Hoping we can get this back down with insulin- be in touch with [place]

## 2019-07-24 ENCOUNTER — Telehealth: Payer: Self-pay

## 2019-07-24 NOTE — Telephone Encounter (Signed)
Blood Sugar report  07/23/19 9:50am -368 9pm-41 07/24/19 8am -320

## 2019-07-24 NOTE — Telephone Encounter (Signed)
Called patient gave the following information from lab note: Notes from Dr. Yong Channel We will try to gradually bring this number down-please increase to 7 units tomorrow morning.  Let us know if any blood sugars below 80 but I strongly doubt that will happen  He will increase medication by 2 units in the am and call 2 hours after with reading

## 2019-07-25 ENCOUNTER — Other Ambulatory Visit: Payer: Self-pay

## 2019-07-25 MED ORDER — CARETOUCH PEN NEEDLES 32G X 4 MM MISC: 1.0000 | Freq: Every day | 1 refills | Status: DC

## 2019-07-25 NOTE — Telephone Encounter (Signed)
Patient is returning Joellen's call.  

## 2019-07-25 NOTE — Telephone Encounter (Signed)
Called patient states that he did take 7 U  this morning fasting was 272 and 2 hrs after eating it was 392. Patient informed that you are out of office until Monday. He will continue to take the 7u and if he has any lows he will call us. Patient has requested script for needles to be sent in. I have verified pharmacy and will send in now.

## 2019-07-29 NOTE — Telephone Encounter (Signed)
Please increase his dose to 9 units and have him update [place] in 2 to 3 days-if blood sugars are still above 180 at that time fasting may increase by another 2 units

## 2019-07-29 NOTE — Telephone Encounter (Signed)
Thanks- lets find out how hes doing- I suspect I will need to increase this further

## 2019-07-29 NOTE — Telephone Encounter (Signed)
Patient states that his glucose was 226 before insulin and after insulin was up to 242. Throughout the upper 200's all weekend before and after insulin.

## 2019-07-30 ENCOUNTER — Other Ambulatory Visit: Payer: Self-pay

## 2019-07-30 ENCOUNTER — Ambulatory Visit: Payer: Medicare Other | Admitting: Podiatry

## 2019-07-30 ENCOUNTER — Encounter: Payer: Self-pay | Admitting: Podiatry

## 2019-07-30 ENCOUNTER — Other Ambulatory Visit: Payer: Self-pay | Admitting: Family Medicine

## 2019-07-30 DIAGNOSIS — B351 Tinea unguium: Secondary | ICD-10-CM

## 2019-07-30 DIAGNOSIS — E1121 Type 2 diabetes mellitus with diabetic nephropathy: Secondary | ICD-10-CM

## 2019-07-30 DIAGNOSIS — L600 Ingrowing nail: Secondary | ICD-10-CM

## 2019-07-30 DIAGNOSIS — N1831 Chronic kidney disease, stage 3a: Secondary | ICD-10-CM | POA: Diagnosis not present

## 2019-07-30 NOTE — Telephone Encounter (Signed)
Patient is aware of PCP instructions. Gave verbal understanding to call back in 2-3 days with update.

## 2019-07-31 NOTE — Progress Notes (Signed)
Subjective: 86 year old male presents the office today for follow-up evaluation of his left second digit toenail, ingrown toenail.  He states he has been doing great and is having no pain.  There is no drainage or swelling.  He is on the antibiotic any side effects. Denies any systemic complaints such as fevers, chills, nausea, vomiting. No acute changes since last appointment, and no other complaints at this time.   Objective: AAO x3, NAD DP/PT pulses palpable bilaterally, CRT less than 3 seconds Mild incurvation to the medial aspect of the left second digit toenail.  There is minimal edema but there is no erythema there is no drainage or pus or ascending cellulitis.  No tenderness palpation. No open lesions or pre-ulcerative lesions.  No pain with calf compression, swelling, warmth, erythema  Assessment: Improved ingrown toenail left second toe  Plan: -All treatment options discussed with the patient including all alternatives, risks, complications.  -Debrided the ingrowing portion of the nail with any complications or bleeding.  As he is doing well we will hold off on partial nail avulsion will be to continue monitor closely.  Recommend routine debridements. -Monitor for any clinical signs or symptoms of infection and directed to call the office immediately should any occur or go to the ER. -Patient encouraged to call the office with any questions, concerns, change in symptoms.   Trula Slade DPM

## 2019-08-03 ENCOUNTER — Other Ambulatory Visit: Payer: Self-pay | Admitting: Family Medicine

## 2019-08-05 NOTE — Telephone Encounter (Signed)
Patient has called back with readings:  30th:  201, 245, 201, 220 31st:  [id number] 1st:  181,224,191,170 2nd:  [id number] 3rd:  [id number] 4th:  [id number] 5th:  149  Please follow up with patient in regard.

## 2019-08-05 NOTE — Telephone Encounter (Signed)
#  s improving- increase to 10 units insulin and then update [place] on Thursday or Friday

## 2019-08-05 NOTE — Telephone Encounter (Signed)
Please advise 

## 2019-08-06 NOTE — Telephone Encounter (Signed)
Left message to return call to our office.  

## 2019-08-06 NOTE — Telephone Encounter (Signed)
Patient called back reviewed information will call back later in the week with numbers.

## 2019-08-09 ENCOUNTER — Telehealth: Payer: Self-pay | Admitting: Family Medicine

## 2019-08-09 NOTE — Telephone Encounter (Signed)
Called patient reviewed all information will call if any questions. He will call next week with readings

## 2019-08-09 NOTE — Telephone Encounter (Signed)
Please advise 

## 2019-08-09 NOTE — Telephone Encounter (Signed)
These are looking a lot better-lets continue with current dose of medicine.  Update me again in another week-I was considering increasing the dose but I do not want to run him too low on blood sugar

## 2019-08-09 NOTE — Telephone Encounter (Signed)
Patient called in to speak with Joellen Jersey about his blood sugar levels:  -4/6  Tuesday: 152,155, 150 -4/7  Wednesday: [id number] -4/8  Thursday: 145,180, 160 -4/9   Friday: 111

## 2019-08-16 NOTE — Telephone Encounter (Signed)
See below

## 2019-08-16 NOTE — Telephone Encounter (Signed)
Patient called in for his readings for the week   4/9 - 111 , 167 , 192  4/10 - 118, 231, 107  4/11 - 130, 90, 176  4/12 - 133, 143, 144  4/13 - 136, 141, 157  4/14 - 114, 142, 153  4/15 - 94, 148, 174  4/16 - 129

## 2019-09-12 ENCOUNTER — Other Ambulatory Visit: Payer: Self-pay

## 2019-09-12 ENCOUNTER — Ambulatory Visit (INDEPENDENT_AMBULATORY_CARE_PROVIDER_SITE_OTHER): Payer: Medicare Other | Admitting: Family Medicine

## 2019-09-12 ENCOUNTER — Ambulatory Visit (INDEPENDENT_AMBULATORY_CARE_PROVIDER_SITE_OTHER): Payer: Medicare Other

## 2019-09-12 ENCOUNTER — Encounter: Payer: Self-pay | Admitting: Family Medicine

## 2019-09-12 VITALS — BP 128/76 | HR 64 | Temp 97.8°F | Ht 72.0 in | Wt 242.8 lb

## 2019-09-12 VITALS — BP 128/76 | HR 64 | Temp 97.8°F | Ht 72.0 in | Wt 242.7 lb

## 2019-09-12 DIAGNOSIS — Z0000 Encounter for general adult medical examination without abnormal findings: Secondary | ICD-10-CM

## 2019-09-12 DIAGNOSIS — N183 Chronic kidney disease, stage 3 unspecified: Secondary | ICD-10-CM

## 2019-09-12 DIAGNOSIS — E1122 Type 2 diabetes mellitus with diabetic chronic kidney disease: Secondary | ICD-10-CM | POA: Diagnosis not present

## 2019-09-12 DIAGNOSIS — E785 Hyperlipidemia, unspecified: Secondary | ICD-10-CM

## 2019-09-12 DIAGNOSIS — I10 Essential (primary) hypertension: Secondary | ICD-10-CM | POA: Diagnosis not present

## 2019-09-12 DIAGNOSIS — R351 Nocturia: Secondary | ICD-10-CM

## 2019-09-12 DIAGNOSIS — C9110 Chronic lymphocytic leukemia of B-cell type not having achieved remission: Secondary | ICD-10-CM | POA: Diagnosis not present

## 2019-09-12 DIAGNOSIS — N401 Enlarged prostate with lower urinary tract symptoms: Secondary | ICD-10-CM

## 2019-09-12 DIAGNOSIS — N182 Chronic kidney disease, stage 2 (mild): Secondary | ICD-10-CM

## 2019-09-12 MED ORDER — TRESIBA FLEXTOUCH 100 UNIT/ML ~~LOC~~ SOPN: 20.0000 [IU] | PEN_INJECTOR | Freq: Every day | SUBCUTANEOUS | 5 refills | Status: DC

## 2019-09-12 NOTE — Progress Notes (Signed)
Phone [phone] In person visit   Subjective:   Kevin Rogers is a 86 y.o. year old very pleasant male patient who presents for/with See problem oriented charting Chief Complaint  Patient presents with  . Hypertension  . Diabetes    This visit occurred during the SARS-CoV-2 public health emergency.  Safety protocols were in place, including screening questions prior to the visit, additional usage of staff PPE, and extensive cleaning of exam room while observing appropriate contact time as indicated for disinfecting solutions.   Past Medical History-  Patient Active Problem List   Diagnosis Date Noted  . Chronic lymphocytic leukemia ([place]) 03/08/2007    Priority: High  . Diabetes mellitus type II, controlled ([place]) 03/08/2007    Priority: High  . History of osteomyelitis of lumbar vertebrae     Priority: Medium  . Hyperlipidemia     Priority: Medium  . CKD (chronic kidney disease), stage III 01/13/2015    Priority: Medium  . Lumbar spinal stenosis 06/26/2014    Priority: Medium  . BPH (benign prostatic hyperplasia) 06/24/2014    Priority: Medium  . Gout 03/08/2007    Priority: Medium  . Essential hypertension 03/08/2007    Priority: Medium  . Enteritis due to Clostridium difficile     Priority: Low  . Discitis of lumbar region     Priority: Low  . B12 deficiency 12/12/2008    Priority: Low  . Diabetic polyneuropathy ([place]) 12/12/2008    Priority: Low  . ERECTILE DYSFUNCTION 03/18/2008    Priority: Low  . Glaucoma 03/08/2007    Priority: Low  . Osteoarthritis 03/08/2007    Priority: Low    Medications- reviewed and updated Current Outpatient Medications  Medication Sig Dispense Refill  . acetaminophen (TYLENOL) 500 MG tablet Take 500-1,000 mg by mouth every 6 (six) hours as needed (pain). Reported on 07/15/2015    . atorvastatin (LIPITOR) 10 MG tablet TAKE 1 TABLET BY MOUTH ONCE WEEKLY 13 tablet 3  . finasteride (PROSCAR) 5 MG tablet TAKE 1 TABLET BY MOUTH EVERY  DAY 90 tablet 3  . glipiZIDE (GLUCOTROL XL) 5 MG 24 hr tablet TAKE 1 TABLET BY MOUTH EVERY DAY 90 tablet 1  . glucose blood (ONETOUCH VERIO) test strip Use to test blood sugars daily. Dx: E11.9 100 each 12  . HYDROcodone-acetaminophen (NORCO/VICODIN) 5-325 MG tablet Take 1-2 tablets by mouth every 6 (six) hours as needed.    . Insulin Pen Needle (CARETOUCH PEN NEEDLES) 32G X 4 MM MISC 1 each by Does not apply route daily. 30 each 1  . losartan (COZAAR) 100 MG tablet TAKE 1 TABLET BY MOUTH  DAILY 90 tablet 3  . ONE TOUCH LANCETS MISC Use to test blood sugars. Dx:E11.9 100 each 11  . tamsulosin (FLOMAX) 0.4 MG CAPS capsule TAKE 1 CAPSULE BY MOUTH  DAILY 90 capsule 3  . timolol (TIMOPTIC) 0.5 % ophthalmic solution 1 drop 2 (two) times daily.    . TRAVATAN Z 0.004 % SOLN ophthalmic solution Place 1 drop into the left eye at bedtime.     . vitamin B-12 (CYANOCOBALAMIN) 1000 MCG tablet Take 1,000 mcg by mouth daily.    . insulin degludec (TRESIBA FLEXTOUCH) 100 UNIT/ML FlexTouch Pen Inject 0.2-0.3 mLs (20-30 Units total) into the skin daily. We will also sign for any needed supplies 9 pen 5   No current facility-administered medications for this visit.     Objective:  BP 128/76 (BP Location: Left Arm, Patient Position: Sitting, Cuff Size: Large)  Pulse 64   Temp 97.8 F (36.6 C) (Temporal)   Ht 6' (1.829 m)   Wt 242 lb 12.8 oz (110.1 kg)   SpO2 96%   BMI 32.93 kg/m  Gen: NAD, resting comfortably CV: RRR no murmurs rubs or gallops Lungs: CTAB no crackles, wheeze, rhonchi Ext: trace edema Skin: warm, dry     Assessment and Plan   #hypertension/CKD stage III S: medication: Losartan 100 mg daily- actually takes half Home readings #s: does not check at home  GFR typically in the 50s-previously on meloxicam but stopped in 2017 A/P:  Stable. Continue current medications.    CKD stage III-hopefully stable-update labs next visit  Lab Results  Component Value Date   CREATININE 1.63 (H)  07/22/2019   # Diabetes S: Medication:Nt longer using insulin Tresiba -we have been titrating [place] up-most recently at 20 units (he would do 10 twice a day). Ran out about a week ago. Taking Glipizide CBGs- at times sugar down 94-120s in the morning. Other times sugars could get up into the 130s to 170s. Rarely sugars over 200 since April and being on 10 units.  Exercise and diet- No exercise due to immobility Lab Results  Component Value Date   HGBA1C 10.9 (H) 07/22/2019   A/P: Diabetes was poorly controlled at last visit-recent steroid injection may have contributed.  Blood sugars at home are significantly trending down but he has run out of the [place] -We sent in a refill for him today- I want him to stick with 20 units in the morning and update me in 2 weeks with how morning sugars are doing -We will also have our team check and see if we have any more samples but I think we need to transit-ion to a prescription at this point -He will let me know if he has any lows under 80 -continue glipizide  Patient also likely with diabetic polyneuropathy-tingling in the feet and shooting pains-also may be related to his back pain/lumbar stenosis. Stable on hydrocodone through pain management   #hyperlipidemia S: Medication:Atorvastatin10 once a week. Past primary prevention age range-we are trying to target LDL at least around 100  A/P: Cholesterol has been mildly elevated.  He is going to continue to work on dietary changes-diet rich in fruits and vegetables and low in processed foods.  Should choose chicken and fish over beef and certainly baked [place] of refried.  Exercise is limited by lumbar stenosis   #CLL-continues yearly visits with oncology. Diagnosed in 2000. We will check CBC with differential next visitto make sure stable. Lab Results  Component Value Date   WBC 25.0 Repeated and verified X2. (HH) 07/22/2019   HGB 13.2 07/22/2019   HCT 40.8 07/22/2019   MCV 91.6 07/22/2019   PLT  158.0 07/22/2019     #BPH S: Patient continues to see benefit with finasteride and Flomax. Nocturia roughly twice a night stable. Stream better than it used to be A/P: reasonable control- continue current meds   #Lumbar spinal stenosis- Limits mobility-chronic back pain. Currently taking hydrocodone through pain management   # B12 deficiency Current treatment/medication (oral vs. IM): Oral 1000 mcg   Recommended follow up: Return in about 8 weeks (around 11/07/2019) for follow up- or sooner if needed. Future Appointments  Date Time Provider [place]  09/12/2019  8:30 AM [facility] HEALTH COACH [facility]  02/07/2020  1:00 PM [facility] LAB 2 [facility] None  02/07/2020  1:30 PM Shadad, Mathis Dad, MD [facility] None  Lab/Order associations:   ICD-10-CM   1. Controlled type 2 diabetes mellitus with stage 2 chronic kidney disease, without long-term current use of insulin (HCC)  E11.22    N18.2   2. Chronic lymphocytic leukemia ([place])  C91.10   3. Hyperlipidemia, unspecified hyperlipidemia type  E78.5   4. Essential hypertension  I10   5. Stage 3 chronic kidney disease, unspecified whether stage 3a or 3b CKD  N18.30   6. Benign prostatic hyperplasia with nocturia  N40.1    R35.1     Meds ordered this encounter  Medications  . insulin degludec (TRESIBA FLEXTOUCH) 100 UNIT/ML FlexTouch Pen    Sig: Inject 0.2-0.3 mLs (20-30 Units total) into the skin daily. We will also sign for any needed supplies    Dispense:  9 pen    Refill:  5   Return precautions advised.  Garret Reddish, MD

## 2019-09-12 NOTE — Progress Notes (Signed)
Subjective:   Kevin Rogers is a 86 y.o. male who presents for Medicare Annual/Subsequent preventive examination.  Review of Systems:   Cardiac Risk Factors include: advanced age (>25men, >21 women);diabetes mellitus;male gender;hypertension;dyslipidemia    Objective:    Vitals: BP 128/76   Pulse 64   Temp 97.8 F (36.6 C) (Temporal)   Ht 6' (1.829 m)   Wt 242 lb 11.6 oz (110.1 kg)   SpO2 96%   BMI 32.92 kg/m   Body mass index is 32.92 kg/m.  Advanced Directives 09/12/2019 07/17/2019 01/16/2019 02/26/2018 11/07/2017 10/18/2016 12/08/2015  Does Patient Have a Medical Advance Directive? Yes No Yes No Yes Yes Yes  Type of Advance Directive Living will;Healthcare Power of Attorney - Living will;Healthcare Power of Attorney - Living will;Healthcare Power of [place];Living will Living will  Does patient want to make changes to medical advance directive? No - Patient declined - No - Patient declined - No - Patient declined - -  Copy of [place] in Chart? No - copy requested - No - copy requested - No - copy requested No - copy requested No - copy requested  Would patient like information on creating a medical advance directive? - - - No - Patient declined - No - Patient declined -    Tobacco Social History   Tobacco Use  Smoking Status Former Smoker  . Packs/day: 0.25  . Years: 20.00  . Pack years: 5.00  . Types: Cigarettes  . Quit date: 09/28/1995  . Years since quitting: 23.9  Smokeless Tobacco Never Used     Counseling given: Not Answered   Clinical Intake:  Pre-visit preparation completed: Yes  Pain : No/denies pain  Diabetes: Yes CBG done?: No Did pt. bring in CBG monitor from home?: No  How often do you need to have someone help you when you read instructions, pamphlets, or other written materials from your doctor or pharmacy?: 1 - Never  Interpreter Needed?: No  Information entered by :: Denman George LPN  Past  Medical History:  Diagnosis Date  . Acute bronchitis 09/12/2007   Qualifier: Diagnosis of  By: [name] MD, [name]   . Diabetes mellitus type 2 with complications ([place]) 0000000  . Glaucoma    left eye  . Gout   . Hyperlipidemia   . Hypertension   . Leukemia, chronic ([place]) 2001   does not take treatment just has blood levels checked once a year   Past Surgical History:  Procedure Laterality Date  . ANTERIOR LAT LUMBAR FUSION Left 01/03/2013   Procedure: ANTERIOR LATERAL LUMBAR FUSION lumbar three-four;  Surgeon: [name], MD;  Location: [facility];  Service: Neurosurgery;  Laterality: Left;  . BACK SURGERY     lumbar fusion - 2016, feb  . COLONOSCOPY W/ POLYPECTOMY    . DENTAL SURGERY     implanted teeth x 2  . HERNIA REPAIR  1989  . LUMBAR LAMINECTOMY  1997  . LUMBAR PERCUTANEOUS PEDICLE SCREW 1 LEVEL  01/03/2013   Procedure: LUMBAR PERCUTANEOUS PEDICLE SCREWS LUMBAR THREE-FOUR;  Surgeon: [name], MD;  Location: [facility];  Service: Neurosurgery;;  . LUMBAR WOUND DEBRIDEMENT N/A 07/23/2014   Procedure: LUMBAR WOUND DEBRIDEMENT;  Surgeon: [name], MD;  Location: [place] [facility];  Service: Neurosurgery;  Laterality: N/A;  . TONSILLECTOMY     Family History  Problem Relation Age of Onset  . Heart disease Mother  83, second hand  . Heart disease Father        84, former smoker   Social History   Socioeconomic History  . Marital status: Married    Spouse name: Not on file  . Number of children: Not on file  . Years of education: Not on file  . Highest education level: Associate degree: occupational, Hotel manager, or vocational program  Occupational History  . Occupation: Retired     Comment: -Land   Tobacco Use  . Smoking status: Former Smoker    Packs/day: 0.25    Years: 20.00    Pack years: 5.00    Types: Cigarettes    Quit date: 09/28/1995    Years since quitting: 23.9  . Smokeless tobacco: Never Used  Substance and Sexual Activity   . Alcohol use: Yes    Alcohol/week: 1.0 standard drinks    Types: 1 Glasses of wine per week    Comment: rarely  . Drug use: No  . Sexual activity: Yes  Other Topics Concern  . Not on file  Social History Narrative   Remarried. 2017. Widowed 2009. 2 sons. 2 grandkids-boy/girl. No greatgrandkids.     Did everything for himself in past but back limiting him now      Retired from [employer]: golf stopped(hard with back), watch tv (pain with moving around)   Social Determinants of Health   Financial Resource Strain:   . Difficulty of Paying Living Expenses:   Food Insecurity:   . Worried About Charity fundraiser in the Last Year:   . Arboriculturist in the Last Year:   Transportation Needs:   . Film/video editor (Medical):   [name] Lack of Transportation (Non-Medical):   Physical Activity:   . Days of Exercise per Week:   . Minutes of Exercise per Session:   Stress:   . Feeling of Stress :   Social Connections:   . Frequency of Communication with Friends and Family:   . Frequency of Social Gatherings with Friends and Family:   . Attends Religious Services:   . Active Member of Clubs or Organizations:   . Attends Archivist Meetings:   [name] Marital Status:     Outpatient Encounter Medications as of 09/12/2019  Medication Sig  . acetaminophen (TYLENOL) 500 MG tablet Take 500-1,000 mg by mouth every 6 (six) hours as needed (pain). Reported on 07/15/2015  . atorvastatin (LIPITOR) 10 MG tablet TAKE 1 TABLET BY MOUTH ONCE WEEKLY  . finasteride (PROSCAR) 5 MG tablet TAKE 1 TABLET BY MOUTH EVERY DAY  . glipiZIDE (GLUCOTROL XL) 5 MG 24 hr tablet TAKE 1 TABLET BY MOUTH EVERY DAY  . glucose blood (ONETOUCH VERIO) test strip Use to test blood sugars daily. Dx: E11.9  . HYDROcodone-acetaminophen (NORCO/VICODIN) 5-325 MG tablet Take 1-2 tablets by mouth every 6 (six) hours as needed.  . Insulin Pen Needle (CARETOUCH PEN NEEDLES) 32G X 4 MM MISC 1 each by Does not  apply route daily.  [name] losartan (COZAAR) 100 MG tablet TAKE 1 TABLET BY MOUTH  DAILY  . ONE TOUCH LANCETS MISC Use to test blood sugars. Dx:E11.9  . tamsulosin (FLOMAX) 0.4 MG CAPS capsule TAKE 1 CAPSULE BY MOUTH  DAILY  . timolol (TIMOPTIC) 0.5 % ophthalmic solution 1 drop 2 (two) times daily.  . TRAVATAN Z 0.004 % SOLN ophthalmic solution Place 1 drop into the left eye at bedtime.   [name]  vitamin B-12 (CYANOCOBALAMIN) 1000 MCG tablet Take 1,000 mcg by mouth daily.   No facility-administered encounter medications on file as of 09/12/2019.    Activities of Daily Living In your present state of health, do you have any difficulty performing the following activities: 09/12/2019 01/16/2019  Hearing? N N  Vision? N N  Difficulty concentrating or making decisions? N N  Walking or climbing stairs? Y Y  Comment - due to impaired mobility  Dressing or bathing? N N  Doing errands, shopping? N N  Preparing Food and eating ? N N  Using the Toilet? N N  In the past six months, have you accidently leaked urine? N N  Do you have problems with loss of bowel control? N N  Managing your Medications? N N  Managing your Finances? N N  Housekeeping or managing your Housekeeping? N N  Some recent data might be hidden    Patient Care Team: [name], MD as PCP - General (Family Medicine) [name], MD as Consulting Physician (Neurosurgery) Ralene Bathe, MD as Consulting Physician (Ophthalmology) Wyatt Portela, MD as Consulting Physician (Oncology) Marlaine Hind, MD as Consulting Physician (Physical Medicine and Rehabilitation) Trula , DPM as Consulting Physician (Podiatry)   Assessment:   This is a routine wellness examination for [place].  Exercise Activities and Dietary recommendations Current Exercise Habits: The patient does not participate in regular exercise at present  Goals    . Gain strength on the back    . I want my back to feel better     . Maintain current  health status       Fall Risk Fall Risk  09/12/2019 09/12/2019 03/14/2019 01/16/2019 11/07/2017  Falls in the past year? 0 0 0 1 No  Number falls in past yr: 0 0 0 0 -  Injury with Fall? 0 - 0 0 -  Risk for fall due to : Impaired balance/gait;Impaired mobility - Impaired balance/gait Impaired balance/gait;Impaired mobility -  Risk for fall due to: Comment - - - - -  Follow up Education provided;Falls evaluation completed;Falls prevention discussed Falls evaluation completed - Education provided;Falls evaluation completed -   Is the patient's home free of loose throw rugs in walkways, pet beds, electrical cords, etc?   yes      Grab bars in the bathroom? yes      Handrails on the stairs?   yes      Adequate lighting?   yes  Timed Get Up and Go Performed: completed and within normal timeframe; no gait abnormalities noted (utilizes walker for ambulation)    Depression Screen PHQ 2/9 Scores 07/22/2019 01/16/2019 11/07/2017 10/18/2016  PHQ - 2 Score 0 0 0 0  PHQ- 9 Score 0 - 0 -    Cognitive Function- no cognitive concerns at this time      6CIT Screen 09/12/2019 01/16/2019  What Year? 0 points 0 points  What month? 0 points 0 points  What time? 0 points 0 points  Count back from 20 0 points 0 points  Months in reverse 0 points 0 points  Repeat phrase 0 points 0 points  Total Score 0 0    Immunization History  Administered Date(s) Administered  . Fluad Quad(high Dose 65+) 01/02/2019  . Influenza Split 02/10/2011, 01/11/2012  . Influenza Whole 03/15/2007, 01/31/2008, 02/04/2009, 02/01/2010  . Influenza, High Dose Seasonal PF 01/18/2016, 01/24/2017, 01/29/2018  . Influenza,inj,Quad PF,6+ Mos 02/12/2014, 01/13/2015  . Moderna SARS-COVID-2 Vaccination 05/24/2019, 06/10/2019  . Pneumococcal Conjugate-13  03/23/2015  . Pneumococcal Polysaccharide-23 03/24/2009, 02/12/2014  . Td 03/09/2007  . Tdap 09/19/2017    Qualifies for Shingles Vaccine? Discussed and patient will check with  pharmacy for coverage.  Patient education handout provided   Screening Tests Health Maintenance  Topic Date Due  . OPHTHALMOLOGY EXAM  10/16/2019  . INFLUENZA VACCINE  12/01/2019  . HEMOGLOBIN A1C  01/22/2020  . FOOT EXAM  03/13/2020  . TETANUS/TDAP  09/20/2027  . COVID-19 Vaccine  Completed  . PNA vac Low Risk Adult  Completed   Cancer Screenings: Lung: Low Dose CT Chest recommended if Age 84-80 years, 30 pack-year currently smoking OR have quit w/in 15years. Patient does not qualify. Colorectal: No longer indicated      Plan:  I have personally reviewed and addressed the Medicare Annual Wellness questionnaire and have noted the following in the patient's chart:  A. Medical and social history B. Use of alcohol, tobacco or illicit drugs  C. Current medications and supplements D. Functional ability and status E.  Nutritional status F.  Physical activity G. Advance directives H. List of other physicians I.  Hospitalizations, surgeries, and ER visits in previous 12 months J.  [place] such as hearing and vision if needed, cognitive and depression L. Referrals, records requested, and appointments- none    In addition, I have reviewed and discussed with patient certain preventive protocols, quality metrics, and best practice recommendations. A written personalized care plan for preventive services as well as general preventive health recommendations were provided to patient.   Signed,  Denman George, LPN  Nurse Health Advisor   Nurse Notes: no additional

## 2019-09-12 NOTE — Patient Instructions (Addendum)
Kevin Rogers , Thank you for taking time to come for your Medicare Wellness Visit. I appreciate your ongoing commitment to your health goals. Please review the following plan we discussed and let me know if I can assist you in the future.   Screening recommendations/referrals: Colorectal Screening: No longer indicated   Vision and Dental Exams: Recommended annual ophthalmology exams for early detection of glaucoma and other disorders of the eye Recommended annual dental exams for proper oral hygiene  Diabetic Exams: Diabetic Eye Exam: recommended yearly; up to date  Diabetic Foot Exam: recommended yearly; up to date   Vaccinations: Influenza vaccine: up to date; 01/02/19  Pneumococcal vaccine: up to date; last 03/23/15 Tdap vaccine: up to date; last 09/19/17 Shingles vaccine: You may receive this vaccine at your local pharmacy. (see handout)  Covid vaccine: completed   Advanced directives: Please bring a copy of your POA (Power of Attorney) and/or Living Will to your next appointment.  Goals: Recommend to drink at least 6-8 8oz glasses of water per day and consume a balanced diet rich in fresh fruits and vegetables.   Next appointment: Please schedule your Annual Wellness Visit with your Nurse Health Advisor in one year.  Preventive Care 38 Years and Older, Male Preventive care refers to lifestyle choices and visits with your health care provider that can promote health and wellness. What does preventive care include?  A yearly physical exam. This is also called an annual well check.  Dental exams once or twice a year.  Routine eye exams. Ask your health care provider how often you should have your eyes checked.  Personal lifestyle choices, including:  Daily care of your teeth and gums.  Regular physical activity.  Eating a healthy diet.  Avoiding tobacco and drug use.  Limiting alcohol use.  Practicing safe sex.  Taking low doses of aspirin every day if recommended by  your health care provider..  Taking vitamin and mineral supplements as recommended by your health care provider. What happens during an annual well check? The services and screenings done by your health care provider during your annual well check will depend on your age, overall health, lifestyle risk factors, and family history of disease. Counseling  Your health care provider may ask you questions about your:  Alcohol use.  Tobacco use.  Drug use.  Emotional well-being.  Home and relationship well-being.  Sexual activity.  Eating habits.  History of falls.  Memory and ability to understand (cognition).  Work and work Statistician. Screening  You may have the following tests or measurements:  Height, weight, and BMI.  Blood pressure.  Lipid and cholesterol levels. These may be checked every 5 years, or more frequently if you are over 56 years old.  Skin check.  Lung cancer screening. You may have this screening every year starting at age 66 if you have a 30-pack-year history of smoking and currently smoke or have quit within the past 15 years.  Fecal occult blood test (FOBT) of the stool. You may have this test every year starting at age 38.  Flexible sigmoidoscopy or colonoscopy. You may have a sigmoidoscopy every 5 years or a colonoscopy every 10 years starting at age 41.  Prostate cancer screening. Recommendations will vary depending on your family history and other risks.  Hepatitis C blood test.  Hepatitis B blood test.  Sexually transmitted disease (STD) testing.  Diabetes screening. This is done by checking your blood sugar (glucose) after you have not eaten for a while (  fasting). You may have this done every 1-3 years.  Abdominal aortic aneurysm (AAA) screening. You may need this if you are a current or former smoker.  Osteoporosis. You may be screened starting at age 64 if you are at high risk. Talk with your health care provider about your test  results, treatment options, and if necessary, the need for more tests. Vaccines  Your health care provider may recommend certain vaccines, such as:  Influenza vaccine. This is recommended every year.  Tetanus, diphtheria, and acellular pertussis (Tdap, Td) vaccine. You may need a Td booster every 10 years.  Zoster vaccine. You may need this after age 90.  Pneumococcal 13-valent conjugate (PCV13) vaccine. One dose is recommended after age 48.  Pneumococcal polysaccharide (PPSV23) vaccine. One dose is recommended after age 91. Talk to your health care provider about which screenings and vaccines you need and how often you need them. This information is not intended to replace advice given to you by your health care provider. Make sure you discuss any questions you have with your health care provider. Document Released: 05/15/2015 Document Revised: 01/06/2016 Document Reviewed: 02/17/2015 Elsevier Interactive Patient Education  2017 [place] Prevention in the Home Falls can cause injuries. They can happen to people of all ages. There are many things you can do to make your home safe and to help prevent falls. What can I do on the outside of my home?  Regularly fix the edges of walkways and driveways and fix any cracks.  Remove anything that might make you trip as you walk through a door, such as a raised step or threshold.  Trim any bushes or trees on the path to your home.  Use bright outdoor lighting.  Clear any walking paths of anything that might make someone trip, such as rocks or tools.  Regularly check to see if handrails are loose or broken. Make sure that both sides of any steps have handrails.  Any raised decks and porches should have guardrails on the edges.  Have any leaves, snow, or ice cleared regularly.  Use sand or salt on walking paths during winter.  Clean up any spills in your garage right away. This includes oil or grease spills. What can I do in  the bathroom?  Use night lights.  Install grab bars by the toilet and in the tub and shower. Do not use towel bars as grab bars.  Use non-skid mats or decals in the tub or shower.  If you need to sit down in the shower, use a plastic, non-slip stool.  Keep the floor dry. Clean up any water that spills on the floor as soon as it happens.  Remove soap buildup in the tub or shower regularly.  Attach bath mats securely with double-sided non-slip rug tape.  Do not have throw rugs and other things on the floor that can make you trip. What can I do in the bedroom?  Use night lights.  Make sure that you have a light by your bed that is easy to reach.  Do not use any sheets or blankets that are too big for your bed. They should not hang down onto the floor.  Have a firm chair that has side arms. You can use this for support while you get dressed.  Do not have throw rugs and other things on the floor that can make you trip. What can I do in the kitchen?  Clean up any spills right away.  Avoid  walking on wet floors.  Keep items that you use a lot in easy-to-reach places.  If you need to reach something above you, use a strong step stool that has a grab bar.  Keep electrical cords out of the way.  Do not use floor polish or wax that makes floors slippery. If you must use wax, use non-skid floor wax.  Do not have throw rugs and other things on the floor that can make you trip. What can I do with my stairs?  Do not leave any items on the stairs.  Make sure that there are handrails on both sides of the stairs and use them. Fix handrails that are broken or loose. Make sure that handrails are as long as the stairways.  Check any carpeting to make sure that it is firmly attached to the stairs. Fix any carpet that is loose or worn.  Avoid having throw rugs at the top or bottom of the stairs. If you do have throw rugs, attach them to the floor with carpet tape.  Make sure that you  have a light switch at the top of the stairs and the bottom of the stairs. If you do not have them, ask someone to add them for you. What else can I do to help prevent falls?  Wear shoes that:  Do not have high heels.  Have rubber bottoms.  Are comfortable and fit you well.  Are closed at the toe. Do not wear sandals.  If you use a stepladder:  Make sure that it is fully opened. Do not climb a closed stepladder.  Make sure that both sides of the stepladder are locked into place.  Ask someone to hold it for you, if possible.  Clearly mark and make sure that you can see:  Any grab bars or handrails.  First and last steps.  Where the edge of each step is.  Use tools that help you move around (mobility aids) if they are needed. These include:  Canes.  Walkers.  Scooters.  Crutches.  Turn on the lights when you go into a dark area. Replace any light bulbs as soon as they burn out.  Set up your furniture so you have a clear path. Avoid moving your furniture around.  If any of your floors are uneven, fix them.  If there are any pets around you, be aware of where they are.  Review your medicines with your doctor. Some medicines can make you feel dizzy. This can increase your chance of falling. Ask your doctor what other things that you can do to help prevent falls. This information is not intended to replace advice given to you by your health care provider. Make sure you discuss any questions you have with your health care provider. Document Released: 02/12/2009 Document Revised: 09/24/2015 Document Reviewed: 05/23/2014 Elsevier Interactive Patient Education  2017 [employer].

## 2019-09-12 NOTE — Patient Instructions (Addendum)
Diabetes was poorly controlled at last visit-recent steroid injection may have contributed.  Blood sugars at home are significantly trending down but he has run out of the [place] -We sent in a refill for him today of tresiba- I want him to stick with 20 units in the morning and update me in 2 weeks with how morning sugars are doing -We will also have our team check and see if we have any more samples but I think we need to transit-ion to a prescription at this point -He will let me know if he has any lows under 80 -continue glipizide  Return in about 8 weeks (around 11/07/2019) for follow up- or sooner if needed.

## 2019-09-25 DIAGNOSIS — H401131 Primary open-angle glaucoma, bilateral, mild stage: Secondary | ICD-10-CM | POA: Diagnosis not present

## 2019-10-03 DIAGNOSIS — M961 Postlaminectomy syndrome, not elsewhere classified: Secondary | ICD-10-CM | POA: Diagnosis not present

## 2019-10-03 DIAGNOSIS — M461 Sacroiliitis, not elsewhere classified: Secondary | ICD-10-CM | POA: Diagnosis not present

## 2019-10-03 DIAGNOSIS — Z79899 Other long term (current) drug therapy: Secondary | ICD-10-CM | POA: Diagnosis not present

## 2019-10-16 ENCOUNTER — Other Ambulatory Visit: Payer: Self-pay

## 2019-10-16 MED ORDER — ONETOUCH VERIO VI STRP: ORAL_STRIP | 12 refills | Status: DC

## 2019-10-28 ENCOUNTER — Other Ambulatory Visit: Payer: Self-pay

## 2019-10-28 ENCOUNTER — Ambulatory Visit: Payer: Medicare Other | Admitting: Podiatry

## 2019-10-28 DIAGNOSIS — M79674 Pain in right toe(s): Secondary | ICD-10-CM

## 2019-10-28 DIAGNOSIS — N1831 Chronic kidney disease, stage 3a: Secondary | ICD-10-CM

## 2019-10-28 DIAGNOSIS — B351 Tinea unguium: Secondary | ICD-10-CM | POA: Diagnosis not present

## 2019-10-28 DIAGNOSIS — E1121 Type 2 diabetes mellitus with diabetic nephropathy: Secondary | ICD-10-CM

## 2019-10-28 DIAGNOSIS — M79675 Pain in left toe(s): Secondary | ICD-10-CM | POA: Diagnosis not present

## 2019-10-30 NOTE — Progress Notes (Signed)
Subjective: 86 y.o. returns the office today for painful, elongated, thickened toenails which he cannot trim himself. Denies any redness or drainage around the nails.  States that the left second toenails been doing well.  Denies any acute changes since last appointment and no new complaints today. Denies any systemic complaints such as fevers, chills, nausea, vomiting.   PCP: [name], MD A1c:10.9  Objective: AAO 3, NAD DP/PT pulses palpable, CRT less than 3 seconds Nails hypertrophic, dystrophic, elongated, brittle, discolored 10. There is tenderness overlying the nails 1-5 bilaterally. There is no surrounding erythema or drainage along the nail sites. No open lesions or pre-ulcerative lesions are identified. No other areas of tenderness bilateral lower extremities. No overlying edema, erythema, increased warmth. No pain with calf compression, swelling, warmth, erythema.  Assessment: Patient presents with symptomatic onychomycosis  Plan: -Treatment options including alternatives, risks, complications were discussed -Nails sharply debrided 10 without complication/bleeding. -Discussed daily foot inspection. If there are any changes, to call the office immediately.  -Follow-up in 3 months or sooner if any problems are to arise. In the meantime, encouraged to call the office with any questions, concerns, changes symptoms.  Celesta Gentile, DPM

## 2019-11-08 NOTE — Progress Notes (Signed)
Phone [phone] In person visit   Subjective:   Kevin Rogers is a 86 y.o. year old very pleasant male patient who presents for/with See problem oriented charting Chief Complaint  Patient presents with  . Diabetes    This visit occurred during the SARS-CoV-2 public health emergency.  Safety protocols were in place, including screening questions prior to the visit, additional usage of staff PPE, and extensive cleaning of exam room while observing appropriate contact time as indicated for disinfecting solutions.   Past Medical History-  Patient Active Problem List   Diagnosis Date Noted  . Chronic lymphocytic leukemia ([place]) 03/08/2007    Priority: High  . Diabetes mellitus type II, controlled ([place]) 03/08/2007    Priority: High  . History of osteomyelitis of lumbar vertebrae     Priority: Medium  . Hyperlipidemia     Priority: Medium  . CKD (chronic kidney disease), stage III 01/13/2015    Priority: Medium  . Lumbar spinal stenosis 06/26/2014    Priority: Medium  . BPH (benign prostatic hyperplasia) 06/24/2014    Priority: Medium  . Gout 03/08/2007    Priority: Medium  . Essential hypertension 03/08/2007    Priority: Medium  . Enteritis due to Clostridium difficile     Priority: Low  . Discitis of lumbar region     Priority: Low  . B12 deficiency 12/12/2008    Priority: Low  . Diabetic polyneuropathy ([place]) 12/12/2008    Priority: Low  . ERECTILE DYSFUNCTION 03/18/2008    Priority: Low  . Glaucoma 03/08/2007    Priority: Low  . Osteoarthritis 03/08/2007    Priority: Low    Medications- reviewed and updated Current Outpatient Medications  Medication Sig Dispense Refill  . acetaminophen (TYLENOL) 500 MG tablet Take 500-1,000 mg by mouth every 6 (six) hours as needed (pain). Reported on 07/15/2015    . atorvastatin (LIPITOR) 10 MG tablet TAKE 1 TABLET BY MOUTH ONCE WEEKLY 13 tablet 3  . finasteride (PROSCAR) 5 MG tablet TAKE 1 TABLET BY MOUTH EVERY DAY 90 tablet 3    . glipiZIDE (GLUCOTROL XL) 5 MG 24 hr tablet TAKE 1 TABLET BY MOUTH EVERY DAY 90 tablet 1  . glucose blood (ONETOUCH VERIO) test strip Use to test blood sugars twice  daily. Dx: E11.9 100 each 12  . HYDROcodone-acetaminophen (NORCO/VICODIN) 5-325 MG tablet Take 1-2 tablets by mouth every 6 (six) hours as needed.    . insulin degludec (TRESIBA FLEXTOUCH) 100 UNIT/ML FlexTouch Pen Inject 0.2-0.3 mLs (20-30 Units total) into the skin daily. We will also sign for any needed supplies 9 pen 5  . Insulin Pen Needle (CARETOUCH PEN NEEDLES) 32G X 4 MM MISC 1 each by Does not apply route daily. 30 each 1  . losartan (COZAAR) 100 MG tablet TAKE 1 TABLET BY MOUTH  DAILY 90 tablet 3  . ONE TOUCH LANCETS MISC Use to test blood sugars. Dx:E11.9 100 each 11  . tamsulosin (FLOMAX) 0.4 MG CAPS capsule TAKE 1 CAPSULE BY MOUTH  DAILY 90 capsule 3  . timolol (TIMOPTIC) 0.5 % ophthalmic solution 1 drop 2 (two) times daily.    . TRAVATAN Z 0.004 % SOLN ophthalmic solution Place 1 drop into the left eye at bedtime.     . vitamin B-12 (CYANOCOBALAMIN) 1000 MCG tablet Take 1,000 mcg by mouth daily.     No current facility-administered medications for this visit.     Objective:  BP 130/72   Pulse (!) 58   Temp 98 F (36.7  C) (Temporal)   Ht 6' (1.829 m)   Wt 247 lb (112 kg)   SpO2 96%   BMI 33.50 kg/m  Gen: NAD, resting comfortably CV: RRR no murmurs rubs or gallops Lungs: CTAB no crackles, wheeze, rhonchi Abdomen: soft/nontender/nondistended/normal bowel sounds. No rebound or guarding.  Ext: trace edema Skin: warm, dry Neuro: walks with rollator walker    Assessment and Plan   #hypertension/CKD stage III S: medication: Losartan 100 mg daily- only takes half Home readings #s: does not check at home.   A/P: GFR typically in the 50s-previously on meloxicam but stopped in 2017 A/P: stable, continue current medicines.     CKD stage III-hopefully stable-update labs today   # Diabetes S: Medication:  tresiba 15 units. Had a low at one point into 70sand reduced from 20- since that time has not been below 90.  Taking Glipizide CBGs- had brought log for review high was 144 low 95 on average between 120-130.  Exercise and diet- No exercise due to immobility Lab Results  Component Value Date   HGBA1C 6.1 (A) 11/11/2019   HGBA1C 10.9 (H) 07/22/2019   HGBA1C 7.0 (H) 03/14/2019   A/P: Excellent control-continue Tresiba 15 units and glipizide. I think he did a great job adjusting down from 20 to 15 units and I would like for him to let me know if he has any more low blood sugars-particularly under 80 (though 70 would be true low.  Patient also likely with diabetic polyneuropathy-tingling in the feet and shooting pains-also may be related to his back pain/lumbar stenosis. Stable on no medication- states mainly fingers recently.    #hyperlipidemia  S: Medication:Atorvastatin once a week. Past primary prevention age range-we are trying to target LDL at least around 100 Lab Results  Component Value Date   CHOL 182 03/14/2019   HDL 51.90 03/14/2019   LDLCALC 105 (H) 03/14/2019   LDLDIRECT 76.0 11/23/2017   TRIG 127.0 03/14/2019   CHOLHDL 4 03/14/2019   A/P: slightly high on last check- update in november   #CLL-continues yearly visits with oncology. Diagnosed in 2000. We will check CBC with differential today to make sure stable-last visit was slightly high but had recently had an injection with steroids  #BPH S: Patient continues to see benefit with finasteride and Flomax A/P: Stable. Continue current medications.    #Lumbar spinal stenosis S: Limits mobility-chronic back pain. Currently taking hydrocodone Dr. Brien Few prescribes- twice daily  A/P: states stable recently- continue to follow   # B12 deficiency S: Current treatment/medication (oral vs. IM): Oral 1000 mcg Lab Results  Component Value Date   [id number] 03/14/2019  A/P: check at November physical   Recommended follow up:  Return in about 4 months (around 03/13/2020) for follow up- or sooner if needed, DM follow up. Future Appointments  Date Time Provider [place]  01/28/2020  3:15 PM Trula Slade, [place] [facility]  02/07/2020  1:00 PM [facility] None  02/07/2020  1:30 PM Shadad, Mathis Dad, MD [facility] None    Lab/Order associations:   ICD-10-CM   1. Controlled type 2 diabetes mellitus with stage 2 chronic kidney disease, without long-term current use of insulin (HCC)  E11.22 POCT glycosylated hemoglobin (Hb A1C)   N18.2   2. Stage 3 chronic kidney disease, unspecified whether stage 3a or 3b CKD  N18.30   3. Essential hypertension  I10 CBC with Differential/Platelet    Comprehensive metabolic panel  4. Hyperlipidemia, unspecified hyperlipidemia type  E78.5    Return precautions advised.  Garret Reddish, MD

## 2019-11-11 ENCOUNTER — Ambulatory Visit (INDEPENDENT_AMBULATORY_CARE_PROVIDER_SITE_OTHER): Payer: Medicare Other | Admitting: Family Medicine

## 2019-11-11 ENCOUNTER — Encounter: Payer: Self-pay | Admitting: Family Medicine

## 2019-11-11 ENCOUNTER — Other Ambulatory Visit: Payer: Self-pay

## 2019-11-11 VITALS — BP 130/72 | HR 58 | Temp 98.0°F | Ht 72.0 in | Wt 247.0 lb

## 2019-11-11 DIAGNOSIS — N182 Chronic kidney disease, stage 2 (mild): Secondary | ICD-10-CM

## 2019-11-11 DIAGNOSIS — N183 Chronic kidney disease, stage 3 unspecified: Secondary | ICD-10-CM

## 2019-11-11 DIAGNOSIS — E785 Hyperlipidemia, unspecified: Secondary | ICD-10-CM | POA: Diagnosis not present

## 2019-11-11 DIAGNOSIS — I10 Essential (primary) hypertension: Secondary | ICD-10-CM

## 2019-11-11 DIAGNOSIS — E1122 Type 2 diabetes mellitus with diabetic chronic kidney disease: Secondary | ICD-10-CM

## 2019-11-11 LAB — COMPREHENSIVE METABOLIC PANEL
ALT: 15 U/L (ref 0–53)
AST: 15 U/L (ref 0–37)
Albumin: 4 g/dL (ref 3.5–5.2)
Alkaline Phosphatase: 81 U/L (ref 39–117)
BUN: 28 mg/dL — ABNORMAL HIGH (ref 6–23)
CO2: 26 mEq/L (ref 19–32)
Calcium: 9.2 mg/dL (ref 8.4–10.5)
Chloride: 109 mEq/L (ref 96–112)
Creatinine, Ser: 1.55 mg/dL — ABNORMAL HIGH (ref 0.40–1.50)
GFR: 51.74 mL/min — ABNORMAL LOW (ref >60.00)
Glucose, Bld: 121 mg/dL — ABNORMAL HIGH (ref 70–99)
Potassium: 4.7 mEq/L (ref 3.5–5.1)
Sodium: 143 mEq/L (ref 135–145)
Total Bilirubin: 0.6 mg/dL (ref 0.2–1.2)
Total Protein: 5.9 g/dL — ABNORMAL LOW (ref 6.0–8.3)

## 2019-11-11 LAB — CBC WITH DIFFERENTIAL/PLATELET
Basophils Absolute: 0 10*3/uL (ref 0.0–0.1)
Basophils Relative: 0.2 % (ref 0.0–3.0)
Eosinophils Absolute: 0.1 10*3/uL (ref 0.0–0.7)
Eosinophils Relative: 0.9 % (ref 0.0–5.0)
HCT: 38.2 % — ABNORMAL LOW (ref 39.0–52.0)
Hemoglobin: 12.6 g/dL — ABNORMAL LOW (ref 13.0–17.0)
Lymphocytes Relative: 69.1 % — ABNORMAL HIGH (ref 12.0–46.0)
Lymphs Abs: 7.3 10*3/uL — ABNORMAL HIGH (ref 0.7–4.0)
MCHC: 32.9 g/dL (ref 30.0–36.0)
MCV: 90.4 fl (ref 78.0–100.0)
Monocytes Absolute: 0.3 10*3/uL (ref 0.1–1.0)
Monocytes Relative: 3 % (ref 3.0–12.0)
Neutro Abs: 2.8 10*3/uL (ref 1.4–7.7)
Neutrophils Relative %: 26.8 % — ABNORMAL LOW (ref 43.0–77.0)
Platelets: 139 10*3/uL — ABNORMAL LOW (ref 150.0–400.0)
RBC: 4.23 Mil/uL (ref 4.22–5.81)
RDW: 13.4 % (ref 11.5–15.5)
WBC: 10.6 10*3/uL — ABNORMAL HIGH (ref 4.0–10.5)

## 2019-11-11 LAB — POCT GLYCOSYLATED HEMOGLOBIN (HGB A1C): Hemoglobin A1C: 6.1 % — AB (ref 4.0–5.6)

## 2019-11-11 NOTE — Patient Instructions (Addendum)
Health Maintenance Due  Topic Date Due  . OPHTHALMOLOGY EXAM has not had  10/16/2019   Diabetes: A1C was good today at 6.1. Continue the Tresiba at 15 U daily with the Glipizide. If you numbers stay this good we may look at stopping the Glipizide in the future. Let our office know if you have any lows under 80.   Blood pressure: continue losartan 1/2 tab. Looks good in office today.   Cholesterol: continue the atorvastatin weekly. We will recheck in November for your physical.   B-12: continue to take the oral supplement. We will check levels in November at your physical.    If you have any questions or concerns before your next scheduled appointment please give our office a call.        Please stop by lab before you go If you have mychart- we will send your results within 3 business days of [place] receiving them.  If you do not have mychart- we will call you about results within 5 business days of [place] receiving them.    Recommended follow up: Return in about 4 months (around 03/13/2020) for follow up- or sooner if needed, DM follow up.

## 2019-12-02 ENCOUNTER — Telehealth: Payer: Self-pay | Admitting: Family Medicine

## 2019-12-02 ENCOUNTER — Telehealth: Payer: Self-pay | Admitting: *Deleted

## 2019-12-02 NOTE — Telephone Encounter (Signed)
Patient called and left a message stating that he was asking about medicine but did not leave the name of it and I called and left a message returning his call. Lattie Haw

## 2019-12-02 NOTE — Progress Notes (Signed)
  Chronic Care Management   Outreach Note  12/02/2019 Name: Kevin Rogers MRN: [id number] DOB: December 20, 1931  Referred by: [name], MD Reason for referral : No chief complaint on file.   An unsuccessful telephone outreach was attempted today. The patient was referred to the pharmacist for assistance with care management and care coordination.   Follow Up Plan:   Earney Hamburg Upstream Scheduler

## 2019-12-05 ENCOUNTER — Telehealth: Payer: Self-pay | Admitting: Family Medicine

## 2019-12-05 NOTE — Progress Notes (Signed)
  Chronic Care Management   Note  12/05/2019 Name: Kevin Rogers MRN: [id number] DOB: [birth year]  Kevin Rogers is a 86 y.o. year old male who is a primary care patient of Yong Channel, Brayton Mars, MD. I reached out to [name] by phone today in response to a referral sent by Kevin Rogers PCP, [name], MD.   Kevin Rogers was given information about Chronic Care Management services today including:  1. CCM service includes personalized support from designated clinical staff supervised by his physician, including individualized plan of care and coordination with other care providers 2. 24/7 contact phone numbers for assistance for urgent and routine care needs. 3. Service will only be billed when office clinical staff spend 20 minutes or more in a month to coordinate care. 4. Only one practitioner may furnish and bill the service in a calendar month. 5. The patient may stop CCM services at any time (effective at the end of the month) by phone call to the office staff.   Patient agreed to services and verbal consent obtained.   Follow up plan:   Earney Hamburg Upstream Scheduler

## 2019-12-24 ENCOUNTER — Telehealth: Payer: Medicare Other

## 2019-12-24 ENCOUNTER — Ambulatory Visit: Payer: Medicare Other

## 2019-12-31 ENCOUNTER — Other Ambulatory Visit: Payer: Self-pay

## 2019-12-31 DIAGNOSIS — I10 Essential (primary) hypertension: Secondary | ICD-10-CM

## 2019-12-31 DIAGNOSIS — N182 Chronic kidney disease, stage 2 (mild): Secondary | ICD-10-CM

## 2019-12-31 DIAGNOSIS — E1122 Type 2 diabetes mellitus with diabetic chronic kidney disease: Secondary | ICD-10-CM

## 2019-12-31 DIAGNOSIS — E785 Hyperlipidemia, unspecified: Secondary | ICD-10-CM

## 2020-01-01 ENCOUNTER — Ambulatory Visit: Payer: Medicare Other

## 2020-01-01 DIAGNOSIS — E1122 Type 2 diabetes mellitus with diabetic chronic kidney disease: Secondary | ICD-10-CM

## 2020-01-01 DIAGNOSIS — I10 Essential (primary) hypertension: Secondary | ICD-10-CM

## 2020-01-01 DIAGNOSIS — N182 Chronic kidney disease, stage 2 (mild): Secondary | ICD-10-CM

## 2020-01-01 DIAGNOSIS — E785 Hyperlipidemia, unspecified: Secondary | ICD-10-CM

## 2020-01-01 NOTE — Progress Notes (Signed)
Chronic Care Management Pharmacy  Name: Kevin Rogers  MRN: [id number] DOB: [birth year]  Chief Complaint/ HPI  Kevin Rogers,  86 y.o., male presents for their Initial CCM visit with the clinical pharmacist via telephone due to COVID-19 Pandemic.  FBGs ~115 most days, 90s once over past 2 weeks, none lower. Currently not testing BP at home, has not felt dizzy.  Egg, cheese toast, sausage for breakfast. Uses walker or cane to get around.   PCP : [name], MD  Chronic conditions include:  Encounter Diagnoses  Name Primary?  . Essential hypertension Yes  . Controlled type 2 diabetes mellitus with stage 2 chronic kidney disease, without long-term current use of insulin ([place])   . Hyperlipidemia, unspecified hyperlipidemia type     Office Visits:  11/11/2019 (PCP): Continue the Tresiba at 15 U daily with the Glipizide.consider dropping glipizide in future  Patient Active Problem List   Diagnosis Date Noted  . Enteritis due to Clostridium difficile   . Discitis of lumbar region   . History of osteomyelitis of lumbar vertebrae   . Hyperlipidemia   . CKD (chronic kidney disease), stage III 01/13/2015  . Lumbar spinal stenosis 06/26/2014  . BPH (benign prostatic hyperplasia) 06/24/2014  . B12 deficiency 12/12/2008  . Diabetic polyneuropathy ([place]) 12/12/2008  . ERECTILE DYSFUNCTION 03/18/2008  . Chronic lymphocytic leukemia ([place]) 03/08/2007  . Diabetes mellitus type II, controlled ([place]) 03/08/2007  . Gout 03/08/2007  . Glaucoma 03/08/2007  . Essential hypertension 03/08/2007  . Osteoarthritis 03/08/2007   Past Surgical History:  Procedure Laterality Date  . ANTERIOR LAT LUMBAR FUSION Left 01/03/2013   Procedure: ANTERIOR LATERAL LUMBAR FUSION lumbar three-four;  Surgeon: [name], MD;  Location: [facility];  Service: Neurosurgery;  Laterality: Left;  . BACK SURGERY     lumbar fusion - 2016, feb  . COLONOSCOPY W/ POLYPECTOMY    . DENTAL SURGERY     implanted  teeth x 2  . HERNIA REPAIR  1989  . LUMBAR LAMINECTOMY  1997  . LUMBAR PERCUTANEOUS PEDICLE SCREW 1 LEVEL  01/03/2013   Procedure: LUMBAR PERCUTANEOUS PEDICLE SCREWS LUMBAR THREE-FOUR;  Surgeon: [name], MD;  Location: [facility];  Service: Neurosurgery;;  . LUMBAR WOUND DEBRIDEMENT N/A 07/23/2014   Procedure: LUMBAR WOUND DEBRIDEMENT;  Surgeon: [name], MD;  Location: [facility];  Service: Neurosurgery;  Laterality: N/A;  . TONSILLECTOMY     Family History  Problem Relation Age of Onset  . Heart disease Mother        41, second hand  . Heart disease Father        59, former smoker   No Known Allergies Outpatient Encounter Medications as of 01/01/2020  Medication Sig Note  . atorvastatin (LIPITOR) 10 MG tablet TAKE 1 TABLET BY MOUTH ONCE WEEKLY   . finasteride (PROSCAR) 5 MG tablet TAKE 1 TABLET BY MOUTH EVERY DAY   . glipiZIDE (GLUCOTROL XL) 5 MG 24 hr tablet TAKE 1 TABLET BY MOUTH EVERY DAY   . glucose blood (ONETOUCH VERIO) test strip Use to test blood sugars twice  daily. Dx: E11.9   . HYDROcodone-acetaminophen (NORCO/VICODIN) 5-325 MG tablet Take 1-2 tablets by mouth every 6 (six) hours as needed.   . insulin degludec (TRESIBA FLEXTOUCH) 100 UNIT/ML FlexTouch Pen Inject 0.2-0.3 mLs (20-30 Units total) into the skin daily. We will also sign for any needed supplies   . Insulin Pen Needle (CARETOUCH PEN NEEDLES) 32G X 4 MM MISC 1 each  by Does not apply route daily.   [name] losartan (COZAAR) 100 MG tablet TAKE 1 TABLET BY MOUTH  DAILY   . Misc Natural Products (GLUCOSAMINE CHOND CMP ADVANCED PO) Take by mouth in the morning and at bedtime.   . ONE TOUCH LANCETS MISC Use to test blood sugars. Dx:E11.9   . tamsulosin (FLOMAX) 0.4 MG CAPS capsule TAKE 1 CAPSULE BY MOUTH  DAILY   . timolol (TIMOPTIC) 0.5 % ophthalmic solution 1 drop 2 (two) times daily.   . TRAVATAN Z 0.004 % SOLN ophthalmic solution Place 1 drop into the left eye at bedtime.  12/03/2014: Received from: External  Pharmacy  . vitamin B-12 (CYANOCOBALAMIN) 1000 MCG tablet Take 1,000 mcg by mouth daily.   [name] acetaminophen (TYLENOL) 500 MG tablet Take 500-1,000 mg by mouth every 6 (six) hours as needed (pain). Reported on 07/15/2015    No facility-administered encounter medications on file as of 01/01/2020.   Patient Care Team    Relationship Specialty Notifications Start End  [name], MD PCP - General Family Medicine  06/18/14   [name], MD Consulting Physician Neurosurgery  03/31/15   Ralene Bathe, MD Consulting Physician Ophthalmology  11/07/17   Wyatt Portela, MD Consulting Physician Oncology  11/07/17   Marlaine Hind, MD Consulting Physician Physical Medicine and Rehabilitation  11/07/17   Trula Slade, [facility] Consulting Physician Podiatry  09/12/19   Madelin Rear, [facility] Pharmacist Pharmacist  12/05/19    Comment: [phone]   Current Diagnosis/Assessment: Goals Addressed            This Visit's Progress   . PharmD Care Plan       CARE PLAN ENTRY (see longitudinal plan of care for additional care plan information)  Current Barriers:  . Chronic Disease Management support, education, and care coordination needs related to Hypertension, Hyperlipidemia, and Diabetes    Hypertension BP Readings from Last 3 Encounters:  11/11/19 130/72  09/12/19 128/76  09/12/19 128/76   . Pharmacist Clinical Goal(s): o Over the next 180 days, patient will work with PharmD and providers to maintain BP goal <130/80 . Current regimen:  o Losartan 100 mg tablet once daily . Interventions: o Diet/exercise recommendations . Patient self care activities - Over the next 180 days, patient will: o Check BP at least once every 1-2 weeks, document, and provide at future appointments o Ensure daily salt intake < 2300 mg/day  Hyperlipidemia Lab Results  Component Value Date/Time   LDLCALC 105 (H) 03/14/2019 10:33 AM   LDLDIRECT 76.0 11/23/2017 09:19 AM   . Pharmacist Clinical Goal(s): o Over  the next 180 days, patient will work with PharmD and providers to achieve LDL goal < 100 . Current regimen:  o Atorvastatin 10 mg once WEEKLY . Interventions: o Diet/exercise recommendations . Patient self care activities - Over the next 180 days, patient will: o Continue current management  Diabetes Lab Results  Component Value Date/Time   HGBA1C 6.1 (A) 11/11/2019 08:00 AM   HGBA1C 10.9 (H) 07/22/2019 03:49 PM   HGBA1C 7.0 (H) 03/14/2019 10:33 AM   . Pharmacist Clinical Goal(s): o Over the next 180 days, patient will work with PharmD and providers to maintain A1c goal <7% . Current regimen:  o Glipizide xl 5 mg once daily o Insulin degludec Tyler Aas) once daily as directed . Interventions: o Diet/exercise recommendations . Patient self care activities - Over the next 180 days, patient will: o Check blood sugar as directed, document, and provide at future appointments  o Contact provider with any episodes of hypoglycemia  Medication management . Pharmacist Clinical Goal(s): o Over the next 180 days, patient will work with PharmD and providers to maintain optimal medication adherence . Current pharmacy: [facility] . Interventions o Comprehensive medication review performed. o Continue current medication management strategy . Patient self care activities - Over the next 180 days, patient will: o Take medications as prescribed o Report any questions or concerns to PharmD and/or provider(s) Initial goal documentation.      Hypertension   BP goal <130/80  BP Readings from Last 3 Encounters:  11/11/19 130/72  09/12/19 128/76  09/12/19 128/76   Patient checks BP at home infrequently. Patient home BP readings are ranging: n/a.  Patient is currently at goal on the following medications:  . Losartan 100 mg once daily  We discussed diet and exercise extensively.  Plan  Continue current medications and control with diet and exercise.   Diabetes   A1c goal < 7%  Lab  Results  Component Value Date/Time   HGBA1C 6.1 (A) 11/11/2019 08:00 AM   HGBA1C 10.9 (H) 07/22/2019 03:49 PM   HGBA1C 7.0 (H) 03/14/2019 10:33 AM   EGFR 57 (L) 06/09/2016 09:34 AM   EGFR 51 (L) 12/08/2015 08:29 AM   MICROALBUR 1.3 07/08/2015 08:18 AM   MICROALBUR 1.2 07/23/2012 08:17 AM    Checking BG: Daily. Recent FBG readings ~115, 90s once over past 2 weeks.  Patient is currently at goal on the following medications:  [name] Glipizide XL 5 mg once daily  . Insulin degludec 0.2-0.3 mLs (20-30 units total) once daily  We discussed: diet and exercise extensively.  Plan  Continue current medications and control with diet and exercise.  Hyperlipidemia   LDL goal < 100  Lipid Panel     Component Value Date/Time   CHOL 182 03/14/2019 1033   TRIG 127.0 03/14/2019 1033   HDL 51.90 03/14/2019 1033   LDLCALC 105 (H) 03/14/2019 1033   LDLDIRECT 76.0 11/23/2017 0919    Hepatic Function Latest Ref Rng & Units 11/11/2019 07/22/2019 02/07/2019  Total Protein 6.0 - 8.3 g/dL 5.9(L) 6.4 6.7  Albumin 3.5 - 5.2 g/dL 4.0 3.9 3.9  AST 0 - 37 U/L 15 11 12(L)  ALT 0 - 53 U/L _0 Alk Phosphatase 39 - 117 U/L 81 93 66  Total Bilirubin 0.2 - 1.2 mg/dL 0.6 1.4(H) 0.9  Bilirubin, Direct 0.0 - 0.3 mg/dL - - -    Patient is currently at goal on the following medications:  . Atorvastatin 10 mg once WEEKLY  We discussed:  diet and exercise extensively.  Plan  Continue current medications and control with diet and exercise.  Vaccines   Immunization History  Administered Date(s) Administered  . Fluad Quad(high Dose 65+) 01/02/2019  . Influenza Split 02/10/2011, 01/11/2012  . Influenza Whole 03/15/2007, 01/31/2008, 02/04/2009, 02/01/2010  . Influenza, High Dose Seasonal PF 01/18/2016, 01/24/2017, 01/29/2018  . Influenza,inj,Quad PF,6+ Mos 02/12/2014, 01/13/2015  . Moderna SARS-COVID-2 Vaccination 05/24/2019, 06/10/2019  . Pneumococcal Conjugate-13 03/23/2015  . Pneumococcal  Polysaccharide-23 03/24/2009, 02/12/2014  . Td 03/09/2007  . Tdap 09/19/2017   Reviewed and discussed patient's vaccination history including Shingrix and influenza.   Plan  Above vaccines recommended to pt.    Medication Management Coordination   Receives prescription medications from: [facility]  Denies any issues with current medication management.   Plan  Continue current medication management strategy. ___________________________ SDOH (Social Determinants of Health) assessments performed: Yes.  Future Appointments  Date Time Provider [place]  01/28/2020  3:15 PM Trula Slade, DPM [facility]  02/07/2020  1:00 PM [facility] None  02/07/2020  1:30 PM Wyatt Portela, MD [facility] None  03/16/2020  4:00 PM [name], MD [facility]  07/06/2020  1:00 PM [facility] CCM PHARMACIST [facility]   Visit follow-up:  . CPA follow-up: DM call October, January. [name] RPH follow-up: 6 month telephone visit.  Madelin Rear, Pharm.D., BCGP Clinical Pharmacist [facility] [phone]

## 2020-01-01 NOTE — Patient Instructions (Addendum)
Please review care plan below and call me at [phone] (direct line) with any questions!  Thank you, Edyth Gunnels., Clinical Pharmacist  Goals Addressed            This Visit's Progress   . PharmD Care Plan       CARE PLAN ENTRY (see longitudinal plan of care for additional care plan information)  Current Barriers:  . Chronic Disease Management support, education, and care coordination needs related to Hypertension, Hyperlipidemia, and Diabetes    Hypertension BP Readings from Last 3 Encounters:  11/11/19 130/72  09/12/19 128/76  09/12/19 128/76   . Pharmacist Clinical Goal(s): o Over the next 180 days, patient will work with PharmD and providers to maintain BP goal <130/80 . Current regimen:  o Losartan 100 mg tablet once daily . Interventions: o Diet/exercise recommendations . Patient self care activities - Over the next 180 days, patient will: o Check BP at least once every 1-2 weeks, document, and provide at future appointments o Ensure daily salt intake < 2300 mg/day  Hyperlipidemia Lab Results  Component Value Date/Time   LDLCALC 105 (H) 03/14/2019 10:33 AM   LDLDIRECT 76.0 11/23/2017 09:19 AM   . Pharmacist Clinical Goal(s): o Over the next 180 days, patient will work with PharmD and providers to achieve LDL goal < 100 . Current regimen:  o Atorvastatin 10 mg once WEEKLY . Interventions: o Diet/exercise recommendations . Patient self care activities - Over the next 180 days, patient will: o Continue current management  Diabetes Lab Results  Component Value Date/Time   HGBA1C 6.1 (A) 11/11/2019 08:00 AM   HGBA1C 10.9 (H) 07/22/2019 03:49 PM   HGBA1C 7.0 (H) 03/14/2019 10:33 AM   . Pharmacist Clinical Goal(s): o Over the next 180 days, patient will work with PharmD and providers to maintain A1c goal <7% . Current regimen:  o Glipizide xl 5 mg once daily o Insulin degludec Tyler Aas) once daily as directed . Interventions: o Diet/exercise  recommendations . Patient self care activities - Over the next 180 days, patient will: o Check blood sugar as directed, document, and provide at future appointments o Contact provider with any episodes of hypoglycemia  Medication management . Pharmacist Clinical Goal(s): o Over the next 180 days, patient will work with PharmD and providers to maintain optimal medication adherence . Current pharmacy: [facility] . Interventions o Comprehensive medication review performed. o Continue current medication management strategy . Patient self care activities - Over the next 180 days, patient will: o Take medications as prescribed o Report any questions or concerns to PharmD and/or provider(s) Initial goal documentation.      Mr. Mode was given information about Chronic Care Management services today including:  1. CCM service includes personalized support from designated clinical staff supervised by his physician, including individualized plan of care and coordination with other care providers 2. 24/7 contact phone numbers for assistance for urgent and routine care needs. 3. Standard insurance, coinsurance, copays and deductibles apply for chronic care management only during months in which we provide at least 20 minutes of these services. Most insurances cover these services at 100%, however patients may be responsible for any copay, coinsurance and/or deductible if applicable. This service may help you avoid the need for more expensive face-to-face services. 4. Only one practitioner may furnish and bill the service in a calendar month. 5. The patient may stop CCM services at any time (effective at the end of the month) by phone call to the office staff.  Patient agreed to services and verbal consent obtained.   The patient verbalized understanding of instructions provided today and agreed to receive a mailed copy of patient instruction and/or educational materials. Telephone follow up appointment  with pharmacy team member scheduled for: See next appointment with "Care Management Staff" under "What's Next" below.   Kevin Rogers, Pharm.D., BCGP Clinical Pharmacist [facility] [phone]  Diabetes Mellitus and Nutrition, Adult When you have diabetes (diabetes mellitus), it is very important to have healthy eating habits because your blood sugar (glucose) levels are greatly affected by what you eat and drink. Eating healthy foods in the appropriate amounts, at about the same times every day, can help you:  Control your blood glucose.  Lower your risk of heart disease.  Improve your blood pressure.  Reach or maintain a healthy weight. Every person with diabetes is different, and each person has different needs for a meal plan. Your health care provider may recommend that you work with a diet and nutrition specialist (dietitian) to make a meal plan that is best for you. Your meal plan may vary depending on factors such as:  The calories you need.  The medicines you take.  Your weight.  Your blood glucose, blood pressure, and cholesterol levels.  Your activity level.  Other health conditions you have, such as heart or kidney disease. How do carbohydrates affect me? Carbohydrates, also called carbs, affect your blood glucose level more than any other type of food. Eating carbs naturally raises the amount of glucose in your blood. Carb counting is a method for keeping track of how many carbs you eat. Counting carbs is important to keep your blood glucose at a healthy level, especially if you use insulin or take certain oral diabetes medicines. It is important to know how many carbs you can safely have in each meal. This is different for every person. Your dietitian can help you calculate how many carbs you should have at each meal and for each snack. Foods that contain carbs include:  Bread, cereal, rice, pasta, and crackers.  Potatoes and corn.  Peas, beans, and  lentils.  Milk and yogurt.  Fruit and juice.  Desserts, such as cakes, cookies, ice cream, and candy. How does alcohol affect me? Alcohol can cause a sudden decrease in blood glucose (hypoglycemia), especially if you use insulin or take certain oral diabetes medicines. Hypoglycemia can be a life-threatening condition. Symptoms of hypoglycemia (sleepiness, dizziness, and confusion) are similar to symptoms of having too much alcohol. If your health care provider says that alcohol is safe for you, follow these guidelines:  Limit alcohol intake to no more than 1 drink per day for nonpregnant women and 2 drinks per day for men. One drink equals 12 oz of beer, 5 oz of wine, or 1 oz of hard liquor.  Do not drink on an empty stomach.  Keep yourself hydrated with water, diet soda, or unsweetened iced tea.  Keep in mind that regular soda, juice, and other mixers may contain a lot of sugar and must be counted as carbs. What are tips for following this plan?  Reading food labels  Start by checking the serving size on the "Nutrition Facts" label of packaged foods and drinks. The amount of calories, carbs, fats, and other nutrients listed on the label is based on one serving of the item. Many items contain more than one serving per package.  Check the total grams (g) of carbs in one serving. You can calculate  the number of servings of carbs in one serving by dividing the total carbs by 15. For example, if a food has 30 g of total carbs, it would be equal to 2 servings of carbs.  Check the number of grams (g) of saturated and trans fats in one serving. Choose foods that have low or no amount of these fats.  Check the number of milligrams (mg) of salt (sodium) in one serving. Most people should limit total sodium intake to less than 2,300 mg per day.  Always check the nutrition information of foods labeled as "low-fat" or "nonfat". These foods may be higher in added sugar or refined carbs and should  be avoided.  Talk to your dietitian to identify your daily goals for nutrients listed on the label. Shopping  Avoid buying canned, premade, or processed foods. These foods tend to be high in fat, sodium, and added sugar.  Shop around the outside edge of the grocery store. This includes fresh fruits and vegetables, bulk grains, fresh meats, and fresh dairy. Cooking  Use low-heat cooking methods, such as baking, instead of high-heat cooking methods like deep frying.  Cook using healthy oils, such as olive, canola, or sunflower oil.  Avoid cooking with butter, cream, or high-fat meats. Meal planning  Eat meals and snacks regularly, preferably at the same times every day. Avoid going long periods of time without eating.  Eat foods high in fiber, such as fresh fruits, vegetables, beans, and whole grains. Talk to your dietitian about how many servings of carbs you can eat at each meal.  Eat 4-6 ounces (oz) of lean protein each day, such as lean meat, chicken, fish, eggs, or tofu. One oz of lean protein is equal to: ? 1 oz of meat, chicken, or fish. ? 1 egg. ?  cup of tofu.  Eat some foods each day that contain healthy fats, such as avocado, nuts, seeds, and fish. Lifestyle  Check your blood glucose regularly.  Exercise regularly as told by your health care provider. This may include: ? 150 minutes of moderate-intensity or vigorous-intensity exercise each week. This could be brisk walking, biking, or water aerobics. ? Stretching and doing strength exercises, such as yoga or weightlifting, at least 2 times a week.  Take medicines as told by your health care provider.  Do not use any products that contain nicotine or tobacco, such as cigarettes and e-cigarettes. If you need help quitting, ask your health care provider.  Work with a Social worker or diabetes educator to identify strategies to manage stress and any emotional and social challenges. Questions to ask a health care  provider  Do I need to meet with a diabetes educator?  Do I need to meet with a dietitian?  What number can I call if I have questions?  When are the best times to check my blood glucose? Where to find more information:  American Diabetes Association: diabetes.org  Academy of Nutrition and Dietetics: www.eatright.[employer] of Diabetes and Digestive and Kidney Diseases (NIH): [employer] Summary  A healthy meal plan will help you control your blood glucose and maintain a healthy lifestyle.  Working with a diet and nutrition specialist (dietitian) can help you make a meal plan that is best for you.  Keep in mind that carbohydrates (carbs) and alcohol have immediate effects on your blood glucose levels. It is important to count carbs and to use alcohol carefully. This information is not intended to replace advice given to you by your health  care provider. Make sure you discuss any questions you have with your health care provider. Document Revised: 03/31/2017 Document Reviewed: 05/23/2016 Elsevier Patient Education  2020 [employer].

## 2020-01-04 ENCOUNTER — Other Ambulatory Visit: Payer: Self-pay | Admitting: Family Medicine

## 2020-01-20 DIAGNOSIS — Z79899 Other long term (current) drug therapy: Secondary | ICD-10-CM | POA: Diagnosis not present

## 2020-01-20 DIAGNOSIS — M461 Sacroiliitis, not elsewhere classified: Secondary | ICD-10-CM | POA: Diagnosis not present

## 2020-01-20 DIAGNOSIS — M48061 Spinal stenosis, lumbar region without neurogenic claudication: Secondary | ICD-10-CM | POA: Diagnosis not present

## 2020-01-22 ENCOUNTER — Other Ambulatory Visit: Payer: Self-pay | Admitting: Family Medicine

## 2020-01-27 ENCOUNTER — Other Ambulatory Visit: Payer: Self-pay

## 2020-01-27 ENCOUNTER — Ambulatory Visit: Payer: Medicare Other | Admitting: Podiatry

## 2020-01-27 DIAGNOSIS — M79675 Pain in left toe(s): Secondary | ICD-10-CM | POA: Diagnosis not present

## 2020-01-27 DIAGNOSIS — M79674 Pain in right toe(s): Secondary | ICD-10-CM | POA: Diagnosis not present

## 2020-01-27 DIAGNOSIS — B351 Tinea unguium: Secondary | ICD-10-CM | POA: Diagnosis not present

## 2020-01-27 DIAGNOSIS — E1121 Type 2 diabetes mellitus with diabetic nephropathy: Secondary | ICD-10-CM

## 2020-01-27 DIAGNOSIS — N1831 Chronic kidney disease, stage 3a: Secondary | ICD-10-CM

## 2020-01-28 ENCOUNTER — Ambulatory Visit: Payer: Medicare Other | Admitting: Podiatry

## 2020-01-29 DIAGNOSIS — E119 Type 2 diabetes mellitus without complications: Secondary | ICD-10-CM | POA: Diagnosis not present

## 2020-01-29 DIAGNOSIS — H524 Presbyopia: Secondary | ICD-10-CM | POA: Diagnosis not present

## 2020-01-29 DIAGNOSIS — H401132 Primary open-angle glaucoma, bilateral, moderate stage: Secondary | ICD-10-CM | POA: Diagnosis not present

## 2020-01-29 DIAGNOSIS — H2513 Age-related nuclear cataract, bilateral: Secondary | ICD-10-CM | POA: Diagnosis not present

## 2020-01-29 LAB — HM DIABETES EYE EXAM

## 2020-01-29 NOTE — Progress Notes (Signed)
Subjective: 86 y.o. returns the office today for painful, elongated, thickened toenails which he cannot trim himself.Denies any acute changes since last appointment and no new complaints today. Denies any systemic complaints such as fevers, chills, nausea, vomiting.   PCP: [name], MD A1c:6.1 Last AM BS was 121 he reports  Objective: AAO 3, NAD DP/PT pulses palpable, CRT less than 3 seconds Nails hypertrophic, dystrophic, elongated, brittle, discolored 10. There is tenderness overlying the nails 1-5 bilaterally. There is no surrounding erythema or drainage along the nail sites. No open lesions or pre-ulcerative lesions are identified. No other areas of tenderness bilateral lower extremities. No overlying edema, erythema, increased warmth. No pain with calf compression, swelling, warmth, erythema.  Assessment: Patient presents with symptomatic onychomycosis  Plan: -Treatment options including alternatives, risks, complications were discussed -Nails sharply debrided 10 without complication/bleeding. -Discussed daily foot inspection. If there are any changes, to call the office immediately.  -Follow-up in 3 months or sooner if any problems are to arise. In the meantime, encouraged to call the office with any questions, concerns, changes symptoms.  Celesta Gentile, DPM

## 2020-02-05 ENCOUNTER — Telehealth: Payer: Self-pay

## 2020-02-05 NOTE — Progress Notes (Signed)
Chronic Care Management Pharmacy Assistant   Name: Kevin Rogers  MRN: [id number] DOB: 20-Jul-1931  Reason for Encounter: Disease State    PCP : [name], MD  Allergies:  No Known Allergies  Medications: Outpatient Encounter Medications as of 02/05/2020  Medication Sig Note  . acetaminophen (TYLENOL) 500 MG tablet Take 500-1,000 mg by mouth every 6 (six) hours as needed (pain). Reported on 07/15/2015   . atorvastatin (LIPITOR) 10 MG tablet TAKE 1 TABLET BY MOUTH ONCE WEEKLY   . BD PEN NEEDLE NANO 2ND GEN 32G X 4 MM MISC USE AS DIRECTED   . finasteride (PROSCAR) 5 MG tablet TAKE 1 TABLET BY MOUTH EVERY DAY   . glipiZIDE (GLUCOTROL XL) 5 MG 24 hr tablet TAKE 1 TABLET BY MOUTH EVERY DAY   . glucose blood (ONETOUCH VERIO) test strip Use to test blood sugars twice  daily. Dx: E11.9   . HYDROcodone-acetaminophen (NORCO/VICODIN) 5-325 MG tablet Take 1-2 tablets by mouth every 6 (six) hours as needed.   . insulin degludec (TRESIBA FLEXTOUCH) 100 UNIT/ML FlexTouch Pen Inject 0.2-0.3 mLs (20-30 Units total) into the skin daily. We will also sign for any needed supplies   . losartan (COZAAR) 100 MG tablet TAKE 1 TABLET BY MOUTH  DAILY   . Misc Natural Products (GLUCOSAMINE CHOND CMP ADVANCED PO) Take by mouth in the morning and at bedtime.   . ONE TOUCH LANCETS MISC Use to test blood sugars. Dx:E11.9   . tamsulosin (FLOMAX) 0.4 MG CAPS capsule TAKE 1 CAPSULE BY MOUTH  DAILY   . timolol (TIMOPTIC) 0.5 % ophthalmic solution 1 drop 2 (two) times daily.   . TRAVATAN Z 0.004 % SOLN ophthalmic solution Place 1 drop into the left eye at bedtime.  12/03/2014: Received from: External Pharmacy  . vitamin B-12 (CYANOCOBALAMIN) 1000 MCG tablet Take 1,000 mcg by mouth daily.    No facility-administered encounter medications on file as of 02/05/2020.    Current Diagnosis: Patient Active Problem List   Diagnosis Date Noted  . Enteritis due to Clostridium difficile   . Discitis of lumbar region    . History of osteomyelitis of lumbar vertebrae   . Hyperlipidemia   . CKD (chronic kidney disease), stage III ([place]) 01/13/2015  . Lumbar spinal stenosis 06/26/2014  . BPH (benign prostatic hyperplasia) 06/24/2014  . B12 deficiency 12/12/2008  . Diabetic polyneuropathy ([place]) 12/12/2008  . ERECTILE DYSFUNCTION 03/18/2008  . Chronic lymphocytic leukemia ([place]) 03/08/2007  . Diabetes mellitus type II, controlled ([place]) 03/08/2007  . Gout 03/08/2007  . Glaucoma 03/08/2007  . Essential hypertension 03/08/2007  . Osteoarthritis 03/08/2007     Recent Relevant Labs: Lab Results  Component Value Date/Time   HGBA1C 6.1 (A) 11/11/2019 08:00 AM   HGBA1C 10.9 (H) 07/22/2019 03:49 PM   HGBA1C 7.0 (H) 03/14/2019 10:33 AM   MICROALBUR 1.3 07/08/2015 08:18 AM   MICROALBUR 1.2 07/23/2012 08:17 AM    Kidney Function Lab Results  Component Value Date/Time   CREATININE 1.55 (H) 11/11/2019 08:26 AM   CREATININE 1.63 (H) 07/22/2019 10:07 AM   CREATININE 1.57 (H) 02/07/2019 02:38 PM   CREATININE 1.3 06/09/2016 09:34 AM   CREATININE 1.5 (H) 12/08/2015 08:29 AM   GFR 51.74 (L) 11/11/2019 08:26 AM   GFRNONAA 33 (L) 07/17/2019 02:16 PM   GFRNONAA 40 (L) 02/07/2019 02:38 PM   GFRAA 38 (L) 07/17/2019 02:16 PM   GFRAA 46 (L) 02/07/2019 02:38 PM    . Current antihyperglycemic regimen:  atorvastatin (LIPITOR)  10 MG tablet,  glipiZIDE (GLUCOTROL XL) 5 MG 24 hr tablet insulin degludec (TRESIBA FLEXTOUCH) 100 UNIT/ML FlexTouch Pen . What recent interventions/DTPs have been made to improve glycemic control:  o Patient was seen 01-27-20 by Celesta Gentile, DPM. Was seen for symptomatic onychomycosis and was encouraged to check foot daily.  . Have there been any recent hospitalizations or ED visits since last visit with CPP? No . Patient denies hypoglycemic symptoms, including None . Patient denies hyperglycemic symptoms, including none . How often are you checking your blood sugar? once daily . What are  your blood sugars ranging?  o Fasting:  127 average.  Patient states his BS was 107 this morning. o Before meals:  o After meals:  o Bedtime:  . During the week, how often does your blood glucose drop below 70? Never . Are you checking your feet daily/regularly? Patient states that he is having some feet swelling and swelling in his ankle per patient it is all day long never goes down .  Patient states he is not following any specific diet .  Patient states he is not exercise.  Adherence Review: Is the patient currently on a STATIN medication? Yes Is the patient currently on ACE/ARB medication? Yes Does the patient have >5 day gap between last estimated fill dates? CPP to review   [place] ,[place] Pharmacist Assistant [phone]     Follow-Up:  Pharmacist Review

## 2020-02-07 ENCOUNTER — Inpatient Hospital Stay: Payer: Medicare Other

## 2020-02-07 ENCOUNTER — Other Ambulatory Visit: Payer: Self-pay

## 2020-02-07 ENCOUNTER — Inpatient Hospital Stay: Payer: Medicare Other | Attending: Oncology | Admitting: Oncology

## 2020-02-07 VITALS — BP 144/72 | HR 60 | Temp 98.2°F | Resp 20 | Ht 72.0 in | Wt 254.3 lb

## 2020-02-07 DIAGNOSIS — I10 Essential (primary) hypertension: Secondary | ICD-10-CM | POA: Diagnosis not present

## 2020-02-07 DIAGNOSIS — C9110 Chronic lymphocytic leukemia of B-cell type not having achieved remission: Secondary | ICD-10-CM

## 2020-02-07 DIAGNOSIS — D696 Thrombocytopenia, unspecified: Secondary | ICD-10-CM | POA: Diagnosis not present

## 2020-02-07 DIAGNOSIS — E119 Type 2 diabetes mellitus without complications: Secondary | ICD-10-CM | POA: Insufficient documentation

## 2020-02-07 DIAGNOSIS — Z79899 Other long term (current) drug therapy: Secondary | ICD-10-CM | POA: Insufficient documentation

## 2020-02-07 DIAGNOSIS — Z794 Long term (current) use of insulin: Secondary | ICD-10-CM | POA: Diagnosis not present

## 2020-02-07 DIAGNOSIS — E785 Hyperlipidemia, unspecified: Secondary | ICD-10-CM | POA: Insufficient documentation

## 2020-02-07 LAB — CBC WITH DIFFERENTIAL (CANCER CENTER ONLY)
Abs Immature Granulocytes: 0.01 10*3/uL (ref 0.00–0.07)
Basophils Absolute: 0 10*3/uL (ref 0.0–0.1)
Basophils Relative: 0 %
Eosinophils Absolute: 0.1 10*3/uL (ref 0.0–0.5)
Eosinophils Relative: 1 %
HCT: 38.9 % — ABNORMAL LOW (ref 39.0–52.0)
Hemoglobin: 12.5 g/dL — ABNORMAL LOW (ref 13.0–17.0)
Immature Granulocytes: 0 %
Lymphocytes Relative: 69 %
Lymphs Abs: 8.2 10*3/uL — ABNORMAL HIGH (ref 0.7–4.0)
MCH: 28.2 pg (ref 26.0–34.0)
MCHC: 32.1 g/dL (ref 30.0–36.0)
MCV: 87.6 fL (ref 80.0–100.0)
Monocytes Absolute: 0.4 10*3/uL (ref 0.1–1.0)
Monocytes Relative: 3 %
Neutro Abs: 3.3 10*3/uL (ref 1.7–7.7)
Neutrophils Relative %: 27 %
Platelet Count: 151 10*3/uL (ref 150–400)
RBC: 4.44 MIL/uL (ref 4.22–5.81)
RDW: 14 % (ref 11.5–15.5)
WBC Count: 12 10*3/uL — ABNORMAL HIGH (ref 4.0–10.5)
nRBC: 0 % (ref 0.0–0.2)

## 2020-02-07 LAB — CMP (CANCER CENTER ONLY)
ALT: 13 U/L (ref 0–44)
AST: 13 U/L — ABNORMAL LOW (ref 15–41)
Albumin: 3.7 g/dL (ref 3.5–5.0)
Alkaline Phosphatase: 93 U/L (ref 38–126)
Anion gap: 2 — ABNORMAL LOW (ref 5–15)
BUN: 27 mg/dL — ABNORMAL HIGH (ref 8–23)
CO2: 28 mmol/L (ref 22–32)
Calcium: 9.4 mg/dL (ref 8.9–10.3)
Chloride: 112 mmol/L — ABNORMAL HIGH (ref 98–111)
Creatinine: 1.48 mg/dL — ABNORMAL HIGH (ref 0.61–1.24)
GFR, Estimated: 43 mL/min — ABNORMAL LOW (ref >60)
Glucose, Bld: 95 mg/dL (ref 70–99)
Potassium: 4.6 mmol/L (ref 3.5–5.1)
Sodium: 142 mmol/L (ref 135–145)
Total Bilirubin: 1 mg/dL (ref 0.3–1.2)
Total Protein: 6.5 g/dL (ref 6.5–8.1)

## 2020-02-07 NOTE — Progress Notes (Signed)
[facility] PROGRESS NOTE 02/07/20   DIAGNOSIS: 86 year old man with CLL diagnosed in 2007.  He presented with stage disease and lymphocytosis.   CURRENT THERAPY: Active surveillance.  INTERVAL HISTORY:  Mr. Godshall returns today for a follow-up evaluation.  Since the last visit, he reports no major changes in his health.  He denies any recent hospitalization or illnesses.  He denies any lymphadenopathy or constitutional symptoms.  He continues to ambulate with the help of walker without any falls or syncope.       PHYSICAL EXAMINATION: Blood pressure (!) 144/72, pulse 60, temperature 98.2 F (36.8 C), temperature source Tympanic, resp. rate 20, height 6' (1.829 m), weight 254 lb 4.8 oz (115.3 kg), SpO2 100 %.   ECOG 1     General appearance: Comfortable appearing without any discomfort Head: Normocephalic without any trauma Oropharynx: Mucous membranes are moist and pink without any thrush or ulcers. Eyes: Pupils are equal and round reactive to light. Lymph nodes: No cervical, supraclavicular, inguinal or axillary lymphadenopathy.   Heart:regular rate and rhythm.  S1 and S2 without leg edema. Lung: Clear without any rhonchi or wheezes.  No dullness to percussion. Abdomin: Soft, nontender, nondistended with good bowel sounds.  No hepatosplenomegaly. Musculoskeletal: No joint deformity or effusion.  Full range of motion noted. Neurological: No deficits noted on motor, sensory and deep tendon reflex exam. Skin: No petechial rash or dryness.  Appeared moist.     CBC    Component Value Date/Time   WBC 10.6 (H) 11/11/2019 0826   RBC 4.23 11/11/2019 0826   HGB 12.6 (L) 11/11/2019 0826   HGB 12.7 (L) 02/07/2019 1438   HGB 13.3 02/09/2017 0851   HCT 38.2 (L) 11/11/2019 0826   HCT 39.8 02/09/2017 0851   PLT 139.0 (L) 11/11/2019 0826   PLT 145 (L) 02/07/2019 1438   PLT 144 02/09/2017 0851   MCV 90.4 11/11/2019 0826   MCV 89.4 02/09/2017 0851    MCH 29.6 07/17/2019 1416   MCHC 32.9 11/11/2019 0826   RDW 13.4 11/11/2019 0826   RDW 13.5 02/09/2017 0851   LYMPHSABS 7.3 (H) 11/11/2019 0826   LYMPHSABS 10.5 (H) 02/09/2017 0851   MONOABS 0.3 11/11/2019 0826   MONOABS 0.2 02/09/2017 0851   EOSABS 0.1 11/11/2019 0826   EOSABS 0.1 02/09/2017 0851   BASOSABS 0.0 11/11/2019 0826   BASOSABS 0.0 02/09/2017 0851       Assessment and plan:  86 year old man with:   1.  CLL diagnosed and 2007.  He presented with stage 0 disease and lymphocytosis.  The natural course of this disease was discussed at this time.  He continues to be on active surveillance without any indication for treatment.  Laboratory data in the last year were reviewed and continues to show mild lymphocytosis without any worsening cytopenias.  CBC from today was personally reviewed and discussed with the patient which showed no changes in his counts compared to previous CBC in the past.  Risks and benefits of continued active surveillance versus active treatment were discussed.  After discussion today, I recommended active surveillance given the fact that he is asymptomatic and his white cell count has minimally changed over the years.  Potential options to treat his disease including oral targeted therapy with ibrutinib as well as chemotherapy associated with immunotherapy.  Complication associated with these therapies were discussed as well.  He is agreeable to continue with active surveillance at this time.  2.  Cytopenias: Borderline thrombocytopenia noted without  any indication for treatment.  3.  Infectious disease: No recent infections or hospitalizations noted.  Continue to educate him about immunosuppression associated with this condition.  3. Follow-up: In 1 year for a follow-up visit.  30  minutes were dedicated to this visit. The time was spent on reviewing laboratory data, discussing treatment options, complication related to therapy and answering  questions regarding future plan.    Zola Button MD 02/07/20

## 2020-03-05 ENCOUNTER — Other Ambulatory Visit: Payer: Self-pay | Admitting: Family Medicine

## 2020-03-15 NOTE — Patient Instructions (Addendum)
Thanks for doing labs- hoping for good results If you have mychart- we will send your results within 3 business days of [place] receiving them.  If you do not have mychart- we will call you about results within 5 business days of [place] receiving them.  *please note we are currently using [facility] which has a longer processing time than [facility] typically so labs may not come back as quickly as in the past *please also note that you will see labs on mychart as soon as they post. I will later go in and write notes on them- will say "notes from Dr. Yong Channel"  Health Maintenance Due  Topic Date Due  . INFLUENZA VACCINE   Already completed 12/01/2019  . FOOT EXAM In office today  03/13/2020

## 2020-03-15 NOTE — Progress Notes (Signed)
Phone: [phone]   Subjective:  Patient presents today for their annual physical. Chief complaint-noted.   See problem oriented charting- ROS- full  review of systems was completed and negative  except for: eye discharge and itching- related to eye medicines he has been informed, leg swelling wears compression stockings, joint pain, back pain.   The following were reviewed and entered/updated in epic: Past Medical History:  Diagnosis Date  . Acute bronchitis 09/12/2007   Qualifier: Diagnosis of  By: [name] MD, [name]   . Diabetes mellitus type 2 with complications ([place]) 6333  . Glaucoma    left eye  . Gout   . Hyperlipidemia   . Hypertension   . Leukemia, chronic ([place] [place]) 2001   does not take treatment just has blood levels checked once a year   Patient Active Problem List   Diagnosis Date Noted  . Chronic lymphocytic leukemia ([place]) 03/08/2007    Priority: High  . Diabetes mellitus type II, controlled ([place]) 03/08/2007    Priority: High  . History of osteomyelitis of lumbar vertebrae     Priority: Medium  . Hyperlipidemia     Priority: Medium  . CKD (chronic kidney disease), stage III ([place]) 01/13/2015    Priority: Medium  . Lumbar spinal stenosis 06/26/2014    Priority: Medium  . BPH (benign prostatic hyperplasia) 06/24/2014    Priority: Medium  . Gout 03/08/2007    Priority: Medium  . Essential hypertension 03/08/2007    Priority: Medium  . Enteritis due to Clostridium difficile     Priority: Low  . Discitis of lumbar region     Priority: Low  . B12 deficiency 12/12/2008    Priority: Low  . Diabetic polyneuropathy ([place]) 12/12/2008    Priority: Low  . ERECTILE DYSFUNCTION 03/18/2008    Priority: Low  . Glaucoma 03/08/2007    Priority: Low  . Osteoarthritis 03/08/2007    Priority: Low   Past Surgical History:  Procedure Laterality Date  . ANTERIOR LAT LUMBAR FUSION Left 01/03/2013   Procedure: ANTERIOR LATERAL LUMBAR FUSION lumbar three-four;  Surgeon: [name], MD;  Location: [facility];  Service: Neurosurgery;  Laterality: Left;  . BACK SURGERY     lumbar fusion - 2016, feb  . COLONOSCOPY W/ POLYPECTOMY    . DENTAL SURGERY     implanted teeth x 2  . HERNIA REPAIR  1989  . LUMBAR LAMINECTOMY  1997  . LUMBAR PERCUTANEOUS PEDICLE SCREW 1 LEVEL  01/03/2013   Procedure: LUMBAR PERCUTANEOUS PEDICLE SCREWS LUMBAR THREE-FOUR;  Surgeon: [name], MD;  Location: [facility];  Service: Neurosurgery;;  . LUMBAR WOUND DEBRIDEMENT N/A 07/23/2014   Procedure: LUMBAR WOUND DEBRIDEMENT;  Surgeon: [name], MD;  Location: [facility];  Service: Neurosurgery;  Laterality: N/A;  . TONSILLECTOMY      Family History  Problem Relation Age of Onset  . Heart disease Mother        29, second hand  . Heart disease Father        22, former smoker    Medications- reviewed and updated Current Outpatient Medications  Medication Sig Dispense Refill  . acetaminophen (TYLENOL) 500 MG tablet Take 500-1,000 mg by mouth every 6 (six) hours as needed (pain). Reported on 07/15/2015    . atorvastatin (LIPITOR) 10 MG tablet TAKE 1 TABLET BY MOUTH ONCE WEEKLY 13 tablet 3  . BD PEN NEEDLE NANO 2ND GEN 32G X 4 MM MISC USE AS DIRECTED 100 each 1  .  finasteride (PROSCAR) 5 MG tablet TAKE 1 TABLET BY MOUTH EVERY DAY 90 tablet 3  . glipiZIDE (GLUCOTROL XL) 5 MG 24 hr tablet TAKE 1 TABLET BY MOUTH EVERY DAY 90 tablet 1  . glucose blood (ONETOUCH VERIO) test strip Use to test blood sugars twice  daily. Dx: E11.9 100 each 12  . HYDROcodone-acetaminophen (NORCO/VICODIN) 5-325 MG tablet Take 1-2 tablets by mouth every 6 (six) hours as needed.    . insulin degludec (TRESIBA FLEXTOUCH) 100 UNIT/ML FlexTouch Pen Inject 0.2-0.3 mLs (20-30 Units total) into the skin daily. We will also sign for any needed supplies 9 pen 5  . losartan (COZAAR) 100 MG tablet TAKE 1 TABLET BY MOUTH  DAILY 90 tablet 3  . Misc Natural Products (GLUCOSAMINE CHOND CMP ADVANCED PO) Take by mouth  in the morning and at bedtime.    . ONE TOUCH LANCETS MISC Use to test blood sugars. Dx:E11.9 100 each 11  . tamsulosin (FLOMAX) 0.4 MG CAPS capsule TAKE 1 CAPSULE BY MOUTH  DAILY 90 capsule 3  . timolol (TIMOPTIC) 0.5 % ophthalmic solution 1 drop 2 (two) times daily.    . TRAVATAN Z 0.004 % SOLN ophthalmic solution Place 1 drop into the left eye at bedtime.     . vitamin B-12 (CYANOCOBALAMIN) 1000 MCG tablet Take 1,000 mcg by mouth daily.     No current facility-administered medications for this visit.    Allergies-reviewed and updated No Known Allergies  Social History   Social History Narrative   Remarried. 2017. Widowed 2009. 2 sons. 2 grandkids-boy/girl. No greatgrandkids.     Did everything for himself in past but back limiting him now      Retired from [employer]: golf stopped(hard with back), watch tv (pain with moving around)   Objective  Objective:  BP 130/80   Pulse (!) 55   Temp 97.8 F (36.6 C) (Temporal)   Resp 18   Ht 6' (1.829 m)   Wt 251 lb 12.8 oz (114.2 kg)   SpO2 99%   BMI 34.15 kg/m  Gen: NAD, resting comfortably HEENT: Mucous membranes are moist. Oropharynx normal Neck: no thyromegaly CV: RRR no murmurs rubs or gallops Lungs: CTAB no crackles, wheeze, rhonchi Abdomen: soft/nontender/nondistended/normal bowel sounds. No rebound or guarding.  Ext: no edema Skin: warm, dry Neuro: grossly normal, moves all extremities, PERRLA, antalgic gait- walks with walker  Diabetic Foot Exam - Simple   Simple Foot Form Diabetic Foot exam was performed with the following findings: Yes 03/16/2020  4:47 PM  Visual Inspection No deformities, no ulcerations, no other skin breakdown bilaterally: Yes Sensation Testing Intact to touch and monofilament testing bilaterally: Yes Pulse Check Posterior Tibialis and Dorsalis pulse intact bilaterally: Yes Comments      Assessment and Plan  86 y.o. male presenting for annual physical.  Health Maintenance  counseling: 1. Anticipatory guidance: Patient counseled regarding regular dental exams q6 months, eye exams q6 months for gluacoma  avoiding smoking and second hand smoke, limiting alcohol to 2 beverages per day- doesn't drink.   2. Risk factor reduction:  Advised patient of need for regular exercise and diet rich and fruits and vegetables to reduce risk of heart attack and stroke. Exercise- limited by back. Diet-weight pretyt stable lately- reasonably healthy - declines obvious changes needed.  Wt Readings from Last 3 Encounters:  03/16/20 251 lb 12.8 oz (114.2 kg)  02/07/20 254 lb 4.8 oz (115.3 kg)  11/11/19 247 lb (112  kg)   3. Immunizations/screenings/ancillary studies- high dose flu shot today Immunization History  Administered Date(s) Administered  . Fluad Quad(high Dose 65+) 01/02/2019, 02/27/2020  . Influenza Split 02/10/2011, 01/11/2012  . Influenza Whole 03/15/2007, 01/31/2008, 02/04/2009, 02/01/2010  . Influenza, High Dose Seasonal PF 01/18/2016, 01/24/2017, 01/29/2018  . Influenza,inj,Quad PF,6+ Mos 02/12/2014, 01/13/2015  . Moderna SARS-COVID-2 Vaccination 05/24/2019, 06/10/2019, 02/29/2020  . Pneumococcal Conjugate-13 03/23/2015  . Pneumococcal Polysaccharide-23 03/24/2009, 02/12/2014  . Td 03/09/2007  . Tdap 09/19/2017   4. Prostate cancer screening- passed age based screening recommendations  Lab Results  Component Value Date   PSA 0.51 07/08/2015   PSA 0.66 08/05/2013   PSA 0.80 07/23/2012   5. Colon cancer screening - passed age based screening recommendations. No blood in stool . Had wanted to do stool cards until 71 but now passed that age 74. Skin cancer screening- lower risk due to melanin content. advised regular sunscreen use. Denies worrisome, changing, or new skin lesions.  7. Former smoker- 1uit in 1990s- no regular screening 8. STD screening - not indicated  Status of chronic or acute concerns   #hypertension/CKD stage III S: medication: Losartan 100 mg  daily Home readings #s: does not check at home  GFR typically in 50s- update cmp with labs. No more meloxicam since 2017 A/P:  Stable. Continue current medications.   CKD stage III-hopefully stable- update cmp today  # Diabetes S: Medication:tresiba 15 units starting within last month up from 14. Also on glipizide 5 mg XR.  CBGs- 7 day average 113 fasting. 90 day average 120 mostly fasting. No lows- lowest of 90 Exercise and diet- No exercise due to immobility Lab Results  Component Value Date   HGBA1C 6.1 (A) 11/11/2019   A/P: hopefully controlled- update a1c  Patient also likely with diabetic polyneuropathy-tingling in the feet and shooting pains-also may be related to his back pain/lumbar stenosis. Stable on no medication. Continue to monitor.    #hyperlipidemia S: Medication:Atorvastatin 10 mg once a week. Past primary prevention age range-we are trying to target LDL at least around 100  A/P: hopefully LDL under 100- update lipid panel today   #CLL-continues yearly visits with oncology. Diagnosed in 2000. Check cbc today for stability assessment  #BPH S: Patient continues to see benefit with finasteride and Flomax A/P: Stable. Continue current medications.    #Lumbar spinal stenosis S: Limits mobility-chronic back pain. Currently taking hydrocodone from pain management . Pain worse in recent weeks in left low back.   Massaging back with voltaren gel. Had switched from another rub (blue emu) and pain is worse (we discussed doesn't work on low back unfortunately)  Last physical we tried to get records from Dr. Sherwood Gambler given surgery 1997 and fusion 12/2012 but we were unsuccessful in getting records.  A/P: 86 year old male with lumbar stenosis and 2 prior back surgeries last in 2014 with Dr. Claretta Fraise were unsuccessful in getting records last year.  We will go ahead and refer to Dr. Tamala Julian.  This referral is for second opinion but not for pain management -may need updated  imaging  # B12 deficiency S: Current treatment/medication (oral vs. IM): Oral 1000 mcg A/P: no issues since taking b12- we opted to skip b12 today as he is compliant  Recommended follow up: Return in about 4 months (around 07/14/2020). Future Appointments  Date Time Provider [place]  04/27/2020  1:45 PM Trula Slade, [place] [facility]  07/06/2020  1:00 PM [facility] PHARMACIST [facility]  02/05/2021  9:30 AM [facility] None  02/05/2021 10:00 AM Shadad, Mathis Dad, MD [facility] None   Lab/Order associations:non fasting   ICD-10-CM   1. Preventative health care  Z00.00   2. Essential hypertension  I10   3. Controlled type 2 diabetes mellitus with stage 2 chronic kidney disease, without long-term current use of insulin ([facility])  E11.22 Lipid panel   N18.2 Hemoglobin A1c    CBC With Differential/Platelet    COMPLETE METABOLIC PANEL WITH GFR    COMPLETE METABOLIC PANEL WITH GFR    CBC With Differential/Platelet    Hemoglobin A1c    Lipid panel  4. B12 deficiency  E53.8   5. Idiopathic gout of multiple sites, unspecified chronicity  M10.09   6. Hyperlipidemia, unspecified hyperlipidemia type  E78.5 Lipid panel    Lipid panel  7. Spinal stenosis of lumbar region, unspecified whether neurogenic claudication present  M48.061 Ambulatory referral to Sports Medicine    CANCELED: Ambulatory referral to Sports Medicine    No orders of the defined types were placed in this encounter.   Return precautions advised.  Garret Reddish, MD

## 2020-03-16 ENCOUNTER — Ambulatory Visit (INDEPENDENT_AMBULATORY_CARE_PROVIDER_SITE_OTHER): Payer: Medicare Other | Admitting: Family Medicine

## 2020-03-16 ENCOUNTER — Other Ambulatory Visit: Payer: Self-pay

## 2020-03-16 ENCOUNTER — Encounter: Payer: Self-pay | Admitting: Family Medicine

## 2020-03-16 VITALS — BP 130/80 | HR 55 | Temp 97.8°F | Resp 18 | Ht 72.0 in | Wt 251.8 lb

## 2020-03-16 DIAGNOSIS — M1009 Idiopathic gout, multiple sites: Secondary | ICD-10-CM

## 2020-03-16 DIAGNOSIS — Z0000 Encounter for general adult medical examination without abnormal findings: Secondary | ICD-10-CM

## 2020-03-16 DIAGNOSIS — E538 Deficiency of other specified B group vitamins: Secondary | ICD-10-CM

## 2020-03-16 DIAGNOSIS — E785 Hyperlipidemia, unspecified: Secondary | ICD-10-CM | POA: Diagnosis not present

## 2020-03-16 DIAGNOSIS — E1122 Type 2 diabetes mellitus with diabetic chronic kidney disease: Secondary | ICD-10-CM | POA: Diagnosis not present

## 2020-03-16 DIAGNOSIS — N182 Chronic kidney disease, stage 2 (mild): Secondary | ICD-10-CM | POA: Diagnosis not present

## 2020-03-16 DIAGNOSIS — I10 Essential (primary) hypertension: Secondary | ICD-10-CM

## 2020-03-16 DIAGNOSIS — M48061 Spinal stenosis, lumbar region without neurogenic claudication: Secondary | ICD-10-CM

## 2020-03-17 LAB — CBC WITH DIFFERENTIAL/PLATELET
Absolute Monocytes: 392 cells/uL (ref 200–950)
Basophils Absolute: 34 cells/uL (ref 0–200)
Basophils Relative: 0.3 %
Eosinophils Absolute: 123 cells/uL (ref 15–500)
Eosinophils Relative: 1.1 %
HCT: 38.5 % (ref 38.5–50.0)
Hemoglobin: 12.8 g/dL — ABNORMAL LOW (ref 13.2–17.1)
Lymphs Abs: 7694 cells/uL — ABNORMAL HIGH (ref 850–3900)
MCH: 29.3 pg (ref 27.0–33.0)
MCHC: 33.2 g/dL (ref 32.0–36.0)
MCV: 88.1 fL (ref 80.0–100.0)
MPV: 11.5 fL (ref 7.5–12.5)
Monocytes Relative: 3.5 %
Neutro Abs: 2957 cells/uL (ref 1500–7800)
Neutrophils Relative %: 26.4 %
Platelets: 161 10*3/uL (ref 140–400)
RBC: 4.37 10*6/uL (ref 4.20–5.80)
RDW: 13.4 % (ref 11.0–15.0)
Total Lymphocyte: 68.7 %
WBC: 11.2 10*3/uL — ABNORMAL HIGH (ref 3.8–10.8)

## 2020-03-17 LAB — COMPLETE METABOLIC PANEL WITH GFR
AG Ratio: 1.6 (calc) (ref 1.0–2.5)
ALT: 13 U/L (ref 9–46)
AST: 11 U/L (ref 10–35)
Albumin: 3.9 g/dL (ref 3.6–5.1)
Alkaline phosphatase (APISO): 93 U/L (ref 35–144)
BUN/Creatinine Ratio: 17 (calc) (ref 6–22)
BUN: 24 mg/dL (ref 7–25)
CO2: 22 mmol/L (ref 20–32)
Calcium: 9.3 mg/dL (ref 8.6–10.3)
Chloride: 112 mmol/L — ABNORMAL HIGH (ref 98–110)
Creat: 1.43 mg/dL — ABNORMAL HIGH (ref 0.70–1.11)
GFR, Est African American: 51 mL/min/{1.73_m2} — ABNORMAL LOW (ref >=60)
GFR, Est Non African American: 44 mL/min/{1.73_m2} — ABNORMAL LOW (ref >=60)
Globulin: 2.4 g/dL (calc) (ref 1.9–3.7)
Glucose, Bld: 113 mg/dL — ABNORMAL HIGH (ref 65–99)
Potassium: 4.9 mmol/L (ref 3.5–5.3)
Sodium: 142 mmol/L (ref 135–146)
Total Bilirubin: 0.8 mg/dL (ref 0.2–1.2)
Total Protein: 6.3 g/dL (ref 6.1–8.1)

## 2020-03-17 LAB — LIPID PANEL
Cholesterol: 155 mg/dL (ref <200)
HDL: 47 mg/dL (ref >=40)
LDL Cholesterol (Calc): 85 mg/dL (calc)
Non-HDL Cholesterol (Calc): 108 mg/dL (calc) (ref <130)
Total CHOL/HDL Ratio: 3.3 (calc) (ref <5.0)
Triglycerides: 131 mg/dL (ref <150)

## 2020-03-17 LAB — HEMOGLOBIN A1C
Hgb A1c MFr Bld: 6.1 % of total Hgb — ABNORMAL HIGH (ref <5.7)
Mean Plasma Glucose: 128 (calc)
eAG (mmol/L): 7.1 (calc)

## 2020-04-10 ENCOUNTER — Other Ambulatory Visit: Payer: Self-pay | Admitting: Family Medicine

## 2020-04-12 DIAGNOSIS — Z20822 Contact with and (suspected) exposure to covid-19: Secondary | ICD-10-CM | POA: Diagnosis not present

## 2020-04-15 ENCOUNTER — Encounter: Payer: Self-pay | Admitting: Family Medicine

## 2020-04-15 ENCOUNTER — Other Ambulatory Visit: Payer: Self-pay

## 2020-04-15 ENCOUNTER — Ambulatory Visit: Payer: Medicare Other | Admitting: Family Medicine

## 2020-04-15 DIAGNOSIS — M48061 Spinal stenosis, lumbar region without neurogenic claudication: Secondary | ICD-10-CM | POA: Diagnosis not present

## 2020-04-15 MED ORDER — GABAPENTIN 100 MG PO CAPS: 100.0000 mg | ORAL_CAPSULE | Freq: Every day | ORAL | 0 refills | Status: DC

## 2020-04-15 NOTE — Progress Notes (Signed)
[place] Phone: [phone] Subjective:   [name], am serving as a scribe for Dr. Hulan Saas. This visit occurred during the SARS-CoV-2 public health emergency.  Safety protocols were in place, including screening questions prior to the visit, additional usage of staff PPE, and extensive cleaning of exam room while observing appropriate contact time as indicated for disinfecting solutions.   I'm seeing this patient by the request  of:  [name], MD  CC: Low back pain  YKD:[id number]  Kevin Rogers is a 86 y.o. male coming in with complaint of back pain. Patient states that he has "knots" in lower back near top of his hips. Has had symptoms for over 1 year. Has had several back operations with the most recent one 2 years ago. Has been seeing [facility] doc who has given patient epidurals. Also has has injections into the hip as well. Did have relief from injections in back. Patient does have diabetes and notes blood sugar reading of 513. Patient admits that he was not doing what he should be doing to keep up readings.  Patient is looking for other possibilities to help him with some of the pain.  Patient is also on narcotics that he does take regularly per primary care's notes.  Patient states that he can have some radiation down to the legs.  Sometimes does feel little unstable and walks with the aid of a rolling walker because of this.  Patient would like some relief of the pain if possible.      Past Medical History:  Diagnosis Date  . Acute bronchitis 09/12/2007   Qualifier: Diagnosis of  By: [name] MD, [name]   . Diabetes mellitus type 2 with complications ([place]) 9767  . Glaucoma    left eye  . Gout   . Hyperlipidemia   . Hypertension   . Leukemia, chronic ([place]) 2001   does not take treatment just has blood levels checked once a year   Past Surgical History:  Procedure Laterality Date  . ANTERIOR  LAT LUMBAR FUSION Left 01/03/2013   Procedure: ANTERIOR LATERAL LUMBAR FUSION lumbar three-four;  Surgeon: [name], MD;  Location: [facility];  Service: Neurosurgery;  Laterality: Left;  . BACK SURGERY     lumbar fusion - 2016, feb  . COLONOSCOPY W/ POLYPECTOMY    . DENTAL SURGERY     implanted teeth x 2  . HERNIA REPAIR  1989  . LUMBAR LAMINECTOMY  1997  . LUMBAR PERCUTANEOUS PEDICLE SCREW 1 LEVEL  01/03/2013   Procedure: LUMBAR PERCUTANEOUS PEDICLE SCREWS LUMBAR THREE-FOUR;  Surgeon: [name], MD;  Location: [facility];  Service: Neurosurgery;;  . LUMBAR WOUND DEBRIDEMENT N/A 07/23/2014   Procedure: LUMBAR WOUND DEBRIDEMENT;  Surgeon: [name], MD;  Location: [facility];  Service: Neurosurgery;  Laterality: N/A;  . TONSILLECTOMY     Social History   Socioeconomic History  . Marital status: Married    Spouse name: Not on file  . Number of children: Not on file  . Years of education: Not on file  . Highest education level: Associate degree: occupational, Hotel manager, or vocational program  Occupational History  . Occupation: Retired     Comment: -Land   Tobacco Use  . Smoking status: Former Smoker    Packs/day: 0.25    Years: 20.00    Pack years: 5.00    Types: Cigarettes  Quit date: 09/28/1995    Years since quitting: 24.5  . Smokeless tobacco: Never Used  Vaping Use  . Vaping Use: Never used  Substance and Sexual Activity  . Alcohol use: Yes    Alcohol/week: 1.0 standard drink    Types: 1 Glasses of wine per week    Comment: rarely  . Drug use: No  . Sexual activity: Yes  Other Topics Concern  . Not on file  Social History Narrative   Remarried. 2017. Widowed 2009. 2 sons. 2 grandkids-boy/girl. No greatgrandkids.     Did everything for himself in past but back limiting him now      Retired from [employer]: golf stopped(hard with back), watch tv (pain with moving around)   Social Determinants of Health    Financial Resource Strain: Not on file  Food Insecurity: No Food Insecurity  . Worried About Charity fundraiser in the Last Year: Never true  . Ran Out of Food in the Last Year: Never true  Transportation Needs: No Transportation Needs  . Lack of Transportation (Medical): No  . Lack of Transportation (Non-Medical): No  Physical Activity: Not on file  Stress: Not on file  Social Connections: Not on file   No Known Allergies Family History  Problem Relation Age of Onset  . Heart disease Mother        66, second hand  . Heart disease Father        42, former smoker    Current Outpatient Medications (Endocrine & Metabolic):  .  glipiZIDE (GLUCOTROL XL) 5 MG 24 hr tablet, TAKE 1 TABLET BY MOUTH EVERY DAY .  insulin degludec (TRESIBA FLEXTOUCH) 100 UNIT/ML FlexTouch Pen, Inject 0.2-0.3 mLs (20-30 Units total) into the skin daily. We will also sign for any needed supplies  Current Outpatient Medications (Cardiovascular):  .  atorvastatin (LIPITOR) 10 MG tablet, TAKE 1 TABLET BY MOUTH ONCE WEEKLY .  losartan (COZAAR) 100 MG tablet, TAKE 1 TABLET BY MOUTH  DAILY   Current Outpatient Medications (Analgesics):  .  acetaminophen (TYLENOL) 500 MG tablet, Take 500-1,000 mg by mouth every 6 (six) hours as needed (pain). Reported on 07/15/2015 .  HYDROcodone-acetaminophen (NORCO/VICODIN) 5-325 MG tablet, Take 1-2 tablets by mouth every 6 (six) hours as needed.  Current Outpatient Medications (Hematological):  .  vitamin B-12 (CYANOCOBALAMIN) 1000 MCG tablet, Take 1,000 mcg by mouth daily.  Current Outpatient Medications (Other):  [name]  BD PEN NEEDLE NANO 2ND GEN 32G X 4 MM MISC, USE AS DIRECTED .  finasteride (PROSCAR) 5 MG tablet, TAKE 1 TABLET BY MOUTH EVERY DAY .  glucose blood (ONETOUCH VERIO) test strip, Use to test blood sugars twice  daily. Dx: E11.9 .  Misc Natural Products (GLUCOSAMINE CHOND CMP ADVANCED PO), Take by mouth in the morning and at bedtime. .  ONE TOUCH LANCETS MISC,  Use to test blood sugars. Dx:E11.9 .  tamsulosin (FLOMAX) 0.4 MG CAPS capsule, TAKE 1 CAPSULE BY MOUTH  DAILY .  timolol (TIMOPTIC) 0.5 % ophthalmic solution, 1 drop 2 (two) times daily. .  TRAVATAN Z 0.004 % SOLN ophthalmic solution, Place 1 drop into the left eye at bedtime.  .  gabapentin (NEURONTIN) 100 MG capsule, Take 1 capsule (100 mg total) by mouth at bedtime.   Reviewed prior external information including notes and imaging from  primary care provider As well as notes that were available from care everywhere and other healthcare systems.  Past medical history,  social, surgical and family history all reviewed in electronic medical record.  No pertanent information unless stated regarding to the chief complaint.   Review of Systems:  No headache, visual changes, nausea, vomiting, diarrhea, constipation, dizziness, abdominal pain, skin rash, fevers, chills, night sweats, weight loss, swollen lymph nodes, b, joint swelling, chest pain, shortness of breath, mood changes. POSITIVE muscle aches, body aches  Objective  Blood pressure 122/70, pulse (!) 52, height 6' (1.829 m), weight 252 lb (114.3 kg), SpO2 96 %.   General: No apparent distress alert and oriented x3 mood and affect normal, dressed appropriately.  Sitting comfortably in the chair HEENT: Pupils equal, extraocular movements intact  Respiratory: Patient's speak in full sentences and does not appear short of breath  Cardiovascular: 1+ lower extremity edema, non tender, no erythema  Gait mild wide-based gait with mild shuffling. MSK: Lower extremities have some mild tightness with straight leg but no radicular symptoms.  Patient does have some mild weakness with 4 out of 5 strength of dorsiflexion left arm compared to the right which is also moderately weaker.  Deep tendon reflexes include appear to be symmetric but mildly decreased.  Low back exam do not feel any of the type of knots that patient was talking about.  Patient has  significant tightness on the right side of the spine compared to left.  Mild muscle spasm noted.  Patient does have limited range of motion in all planes.    97110; 15 additional minutes spent for Therapeutic exercises as stated in above notes.  This included exercises focusing on stretching, strengthening, with significant focus on eccentric aspects.   Long term goals include an improvement in range of motion, strength, endurance as well as avoiding reinjury. Patient's frequency would include in 1-2 times a day, 3-5 times a week for a duration of 6-12 weeks. Low back exercises that included:  Pelvic tilt/bracing instruction to focus on control of the pelvic girdle and lower abdominal muscles  Glute strengthening exercises, focusing on proper firing of the glutes without engaging the low back muscles Proper stretching techniques for maximum relief for the hamstrings, hip flexors, low back and some rotation where tolerated    Proper technique shown and discussed handout in great detail with ATC.  All questions were discussed and answered.   Impression and Recommendations:     The above documentation has been reviewed and is accurate and complete Lyndal Pulley, DO

## 2020-04-15 NOTE — Patient Instructions (Addendum)
Exercises 3x a week Gabapentin 100mg  at night Arnica lotion See me again in 5-6 weeks

## 2020-04-15 NOTE — Assessment & Plan Note (Addendum)
Patient has significant lumbar spinal stenosis.  Ambulates with the aid of a rolling walker.  Patient is already on pain medications from another provider.  Patient does have some weakness noted minorly of the lower extremity.  Patient does also have a history of a discitis that I think is contributing as well.  Continues with the narcotics but I do feel that it is very low dose of gabapentin may help him with nighttime pain.  Patient states that he is having more difficulty.  Warned him the different side effects including somnolence and mild confusion.  Patient is willing to try the medication.  We discussed home exercises.  Discussed the potential for formal physical therapy which patient declined.  Follow-up with me again 5 to 6 weeks.  At that time we will consider physical therapy again.  Do not want to do too many other medications.

## 2020-04-16 ENCOUNTER — Other Ambulatory Visit: Payer: Self-pay | Admitting: Family Medicine

## 2020-04-16 DIAGNOSIS — M48061 Spinal stenosis, lumbar region without neurogenic claudication: Secondary | ICD-10-CM | POA: Diagnosis not present

## 2020-04-16 DIAGNOSIS — M461 Sacroiliitis, not elsewhere classified: Secondary | ICD-10-CM | POA: Diagnosis not present

## 2020-04-16 DIAGNOSIS — M961 Postlaminectomy syndrome, not elsewhere classified: Secondary | ICD-10-CM | POA: Diagnosis not present

## 2020-04-16 DIAGNOSIS — Z79899 Other long term (current) drug therapy: Secondary | ICD-10-CM | POA: Diagnosis not present

## 2020-04-27 ENCOUNTER — Ambulatory Visit: Payer: Medicare Other | Admitting: Podiatry

## 2020-05-08 DIAGNOSIS — Z20822 Contact with and (suspected) exposure to covid-19: Secondary | ICD-10-CM | POA: Diagnosis not present

## 2020-05-15 ENCOUNTER — Telehealth: Payer: Self-pay

## 2020-05-15 NOTE — Chronic Care Management (AMB) (Signed)
Chronic Care Management Pharmacy Assistant   Name: Kevin Rogers  MRN: [id number] DOB: 09-May-1931  Reason for Encounter: Disease State /Diabetes Adherence Call   PCP : [name], MD  Allergies:  No Known Allergies  Medications: Outpatient Encounter Medications as of 05/15/2020  Medication Sig Note  . acetaminophen (TYLENOL) 500 MG tablet Take 500-1,000 mg by mouth every 6 (six) hours as needed (pain). Reported on 07/15/2015   . atorvastatin (LIPITOR) 10 MG tablet TAKE 1 TABLET BY MOUTH ONCE WEEKLY   . BD PEN NEEDLE NANO 2ND GEN 32G X 4 MM MISC USE AS DIRECTED   . finasteride (PROSCAR) 5 MG tablet TAKE 1 TABLET BY MOUTH EVERY DAY   . gabapentin (NEURONTIN) 100 MG capsule Take 1 capsule (100 mg total) by mouth at bedtime.   [name] glipiZIDE (GLUCOTROL XL) 5 MG 24 hr tablet TAKE 1 TABLET BY MOUTH EVERY DAY   . glucose blood (ONETOUCH VERIO) test strip Use to test blood sugars twice  daily. Dx: E11.9   . HYDROcodone-acetaminophen (NORCO/VICODIN) 5-325 MG tablet Take 1-2 tablets by mouth every 6 (six) hours as needed.   . insulin degludec (TRESIBA FLEXTOUCH) 100 UNIT/ML FlexTouch Pen Inject 0.2-0.3 mLs (20-30 Units total) into the skin daily. We will also sign for any needed supplies   . losartan (COZAAR) 100 MG tablet TAKE 1 TABLET BY MOUTH  DAILY   . Misc Natural Products (GLUCOSAMINE CHOND CMP ADVANCED PO) Take by mouth in the morning and at bedtime.   . ONE TOUCH LANCETS MISC Use to test blood sugars. Dx:E11.9   . tamsulosin (FLOMAX) 0.4 MG CAPS capsule TAKE 1 CAPSULE BY MOUTH  DAILY   . timolol (TIMOPTIC) 0.5 % ophthalmic solution 1 drop 2 (two) times daily.   . TRAVATAN Z 0.004 % SOLN ophthalmic solution Place 1 drop into the left eye at bedtime.  12/03/2014: Received from: External Pharmacy  . vitamin B-12 (CYANOCOBALAMIN) 1000 MCG tablet Take 1,000 mcg by mouth daily.    No facility-administered encounter medications on file as of 05/15/2020.    Current Diagnosis: Patient  Active Problem List   Diagnosis Date Noted  . Enteritis due to Clostridium difficile   . Discitis of lumbar region   . History of osteomyelitis of lumbar vertebrae   . Hyperlipidemia   . CKD (chronic kidney disease), stage III ([place]) 01/13/2015  . Lumbar spinal stenosis 06/26/2014  . BPH (benign prostatic hyperplasia) 06/24/2014  . B12 deficiency 12/12/2008  . Diabetic polyneuropathy ([place]) 12/12/2008  . ERECTILE DYSFUNCTION 03/18/2008  . Chronic lymphocytic leukemia ([place]) 03/08/2007  . Diabetes mellitus type II, controlled ([place]) 03/08/2007  . Gout 03/08/2007  . Glaucoma 03/08/2007  . Essential hypertension 03/08/2007  . Osteoarthritis 03/08/2007    Recent Relevant Labs: Lab Results  Component Value Date/Time   HGBA1C 6.1 (H) 03/16/2020 04:30 PM   HGBA1C 6.1 (A) 11/11/2019 08:00 AM   HGBA1C 10.9 (H) 07/22/2019 03:49 PM   MICROALBUR 1.3 07/08/2015 08:18 AM   MICROALBUR 1.2 07/23/2012 08:17 AM    Kidney Function Lab Results  Component Value Date/Time   CREATININE 1.43 (H) 03/16/2020 04:30 PM   CREATININE 1.48 (H) 02/07/2020 12:52 PM   CREATININE 1.55 (H) 11/11/2019 08:26 AM   CREATININE 1.63 (H) 07/22/2019 10:07 AM   CREATININE 1.57 (H) 02/07/2019 02:38 PM   CREATININE 1.3 06/09/2016 09:34 AM   CREATININE 1.5 (H) 12/08/2015 08:29 AM   GFR 51.74 (L) 11/11/2019 08:26 AM   GFRNONAA 44 (L) 03/16/2020 04:30  PM   GFRAA 51 (L) 03/16/2020 04:30 PM    Recent Relevant Labs: Lab Results  Component Value Date/Time   HGBA1C 6.1 (H) 03/16/2020 04:30 PM   HGBA1C 6.1 (A) 11/11/2019 08:00 AM   HGBA1C 10.9 (H) 07/22/2019 03:49 PM   MICROALBUR 1.3 07/08/2015 08:18 AM   MICROALBUR 1.2 07/23/2012 08:17 AM    Kidney Function Lab Results  Component Value Date/Time   CREATININE 1.43 (H) 03/16/2020 04:30 PM   CREATININE 1.48 (H) 02/07/2020 12:52 PM   CREATININE 1.55 (H) 11/11/2019 08:26 AM   CREATININE 1.63 (H) 07/22/2019 10:07 AM   CREATININE 1.57 (H) 02/07/2019 02:38 PM   CREATININE  1.3 06/09/2016 09:34 AM   CREATININE 1.5 (H) 12/08/2015 08:29 AM   GFR 51.74 (L) 11/11/2019 08:26 AM   GFRNONAA 44 (L) 03/16/2020 04:30 PM   GFRAA 51 (L) 03/16/2020 04:30 PM    . Current antihyperglycemic regimen:  o Gipizide 5 mg tablet once daily o Tresiba Flextouch 100 unit/mL inject 20-30 units into the skin daily  . What recent interventions/DTPs have been made to improve glycemic control:  o Patient states he takes his medications regularly as instructed by his medical providers. Patient states he has not had any recent medication changes.  . Have there been any recent hospitalizations or ED visits since last visit with CPP? Patient states he has not had any recent hospitalizations or ED visits since his last visit with Madelin Rear, [facility]. However, patient did want to make sure that I take note of his positive COVID test he had on 04/07/2020 at a local drive thru [facility] testing site. Patient states he had very mild symptoms and is currently doing well.  . Patient denies hypoglycemic symptoms, including  Pale, Sweaty, Nervous/irritable and Vision changes. Patient reports hypoglycemic symptoms including feeling shaky and hungry between meals. Patient states he will have a light snack to help with said symptoms.  . Patient denies hyperglycemic symptoms, including blurry vision, excessive thirst, fatigue, polyuria and weakness   . How often are you checking your blood sugar? Once daily before breakfast.  . What are your blood sugars ranging?  o Fasting: 7 day average of 117 o Before meals: n/a o After meals: n/a o Bedtime: n/a  . During the week, how often does your blood glucose drop below 70? Never   . Are you checking your feet daily/regularly?   Patient states he moisturizes his feet daily to help prevent dry skin from cracking and causing sores.  Adherence Review: Is the patient currently on a STATIN medication? Yes Is the patient currently on ACE/ARB medication? Yes Does  the patient have >5 day gap between last estimated fill dates? No   April D Calhoun, [place] Pharmacist Assistant [phone]   Follow-Up:  Pharmacist Review

## 2020-05-22 DIAGNOSIS — Z20822 Contact with and (suspected) exposure to covid-19: Secondary | ICD-10-CM | POA: Diagnosis not present

## 2020-05-26 ENCOUNTER — Other Ambulatory Visit: Payer: Self-pay | Admitting: Family Medicine

## 2020-05-27 ENCOUNTER — Encounter: Payer: Self-pay | Admitting: Family Medicine

## 2020-05-27 ENCOUNTER — Ambulatory Visit: Payer: Medicare Other | Admitting: Family Medicine

## 2020-05-27 ENCOUNTER — Other Ambulatory Visit: Payer: Self-pay

## 2020-05-27 DIAGNOSIS — M48061 Spinal stenosis, lumbar region without neurogenic claudication: Secondary | ICD-10-CM

## 2020-05-27 NOTE — Assessment & Plan Note (Signed)
Patient has significant lumbar spinal stenosis.  Has difficulty overall.  Patient was given an injection from Dr. Brien Few narcotics from another provider.  Discussed with patient's age and other comorbidities this does not give a significant amount of treatment options.  We discussed potential aquatic physical therapy which patient declined.  Encourage patient to follow-up again with his interventional injections being probably the best thing to help him with some of the discomfort and pain.  We discussed potentially repeating advanced imaging which patient also declined.  Follow-up with me more on an as-needed basis.

## 2020-05-27 NOTE — Patient Instructions (Addendum)
Good to see you Encourage you to follow up with Dr. Assunta Curtis and start up injections Suggested aquatic therapy Stop gabapentin  Don't have much to offer but here when you need me See me again as needed

## 2020-05-27 NOTE — Progress Notes (Signed)
[facility] [address] [place] Phone: [phone] Subjective:   I Kevin Rogers am serving as a Education administrator for Dr. Hulan Saas.  This visit occurred during the SARS-CoV-2 public health emergency.  Safety protocols were in place, including screening questions prior to the visit, additional usage of staff PPE, and extensive cleaning of exam room while observing appropriate contact time as indicated for disinfecting solutions.   I'm seeing this patient by the request  of:  [name], MD  CC: Low back pain follow-up  URK:[id number]   04/15/2020 Patient has significant lumbar spinal stenosis.  Ambulates with the aid of a rolling walker.  Patient is already on pain medications from another provider.  Patient does have some weakness noted minorly of the lower extremity.  Patient does also have a history of a discitis that I think is contributing as well.  Continues with the narcotics but I do feel that it is very low dose of gabapentin may help him with nighttime pain.  Patient states that he is having more difficulty.  Warned him the different side effects including somnolence and mild confusion.  Patient is willing to try the medication.  We discussed home exercises.  Discussed the potential for formal physical therapy which patient declined.  Follow-up with me again 5 to 6 weeks.  At that time we will consider physical therapy again.  Do not want to do too many other medications.  Update 05/27/2020 Kevin Rogers is a 87 y.o. male coming in with complaint of lumbar spinal stenosis. Patient states he is about the same today. States his pain is on the right side as well. Was mostly left before.  Patient states that he just feels more like a tightness at the lower back.  States that it from time to time feels like the muscles cramp on him.  Patient states he is never without some type of pain.  Does not feel that the gabapentin has been very helpful.   Patient still ambulates with the aid of a walker.  Denies any radiation significantly down the leg and only complains mostly of the lower back pain.     Past Medical History:  Diagnosis Date  . Acute bronchitis 09/12/2007   Qualifier: Diagnosis of  By: [name] MD, [name]   . Diabetes mellitus type 2 with complications ([place]) 3151  . Glaucoma    left eye  . Gout   . Hyperlipidemia   . Hypertension   . Leukemia, chronic ([place]) 2001   does not take treatment just has blood levels checked once a year   Past Surgical History:  Procedure Laterality Date  . ANTERIOR LAT LUMBAR FUSION Left 01/03/2013   Procedure: ANTERIOR LATERAL LUMBAR FUSION lumbar three-four;  Surgeon: [name], MD;  Location: [facility];  Service: Neurosurgery;  Laterality: Left;  . BACK SURGERY     lumbar fusion - 2016, feb  . COLONOSCOPY W/ POLYPECTOMY    . DENTAL SURGERY     implanted teeth x 2  . HERNIA REPAIR  1989  . LUMBAR LAMINECTOMY  1997  . LUMBAR PERCUTANEOUS PEDICLE SCREW 1 LEVEL  01/03/2013   Procedure: LUMBAR PERCUTANEOUS PEDICLE SCREWS LUMBAR THREE-FOUR;  Surgeon: [name], MD;  Location: [facility];  Service: Neurosurgery;;  . LUMBAR WOUND DEBRIDEMENT N/A 07/23/2014   Procedure: LUMBAR WOUND DEBRIDEMENT;  Surgeon: [name], MD;  Location: [facility];  Service: Neurosurgery;  Laterality: N/A;  .  TONSILLECTOMY     Social History   Socioeconomic History  . Marital status: Married    Spouse name: Not on file  . Number of children: Not on file  . Years of education: Not on file  . Highest education level: Associate degree: occupational, Hotel manager, or vocational program  Occupational History  . Occupation: Retired     Comment: -Land   Tobacco Use  . Smoking status: Former Smoker    Packs/day: 0.25    Years: 20.00    Pack years: 5.00    Types: Cigarettes    Quit date: 09/28/1995    Years since quitting: 24.6  . Smokeless tobacco: Never Used  Vaping Use  .  Vaping Use: Never used  Substance and Sexual Activity  . Alcohol use: Yes    Alcohol/week: 1.0 standard drink    Types: 1 Glasses of wine per week    Comment: rarely  . Drug use: No  . Sexual activity: Yes  Other Topics Concern  . Not on file  Social History Narrative   Remarried. 2017. Widowed 2009. 2 sons. 2 grandkids-boy/girl. No greatgrandkids.     Did everything for himself in past but back limiting him now      Retired from [employer]: golf stopped(hard with back), watch tv (pain with moving around)   Social Determinants of Health   Financial Resource Strain: Not on file  Food Insecurity: No Food Insecurity  . Worried About Charity fundraiser in the Last Year: Never true  . Ran Out of Food in the Last Year: Never true  Transportation Needs: No Transportation Needs  . Lack of Transportation (Medical): No  . Lack of Transportation (Non-Medical): No  Physical Activity: Not on file  Stress: Not on file  Social Connections: Not on file   No Known Allergies Family History  Problem Relation Age of Onset  . Heart disease Mother        84, second hand  . Heart disease Father        51, former smoker    Current Outpatient Medications (Endocrine & Metabolic):  .  glipiZIDE (GLUCOTROL XL) 5 MG 24 hr tablet, TAKE 1 TABLET BY MOUTH EVERY DAY .  insulin degludec (TRESIBA FLEXTOUCH) 100 UNIT/ML FlexTouch Pen, Inject 0.2-0.3 mLs (20-30 Units total) into the skin daily. We will also sign for any needed supplies  Current Outpatient Medications (Cardiovascular):  .  atorvastatin (LIPITOR) 10 MG tablet, TAKE 1 TABLET BY MOUTH ONCE WEEKLY .  losartan (COZAAR) 100 MG tablet, TAKE 1 TABLET BY MOUTH  DAILY   Current Outpatient Medications (Analgesics):  .  acetaminophen (TYLENOL) 500 MG tablet, Take 500-1,000 mg by mouth every 6 (six) hours as needed (pain). Reported on 07/15/2015 .  HYDROcodone-acetaminophen (NORCO/VICODIN) 5-325 MG tablet, Take 1-2 tablets by mouth  every 6 (six) hours as needed.  Current Outpatient Medications (Hematological):  .  vitamin B-12 (CYANOCOBALAMIN) 1000 MCG tablet, Take 1,000 mcg by mouth daily.  Current Outpatient Medications (Other):  [name]  BD PEN NEEDLE NANO 2ND GEN 32G X 4 MM MISC, USE AS DIRECTED .  finasteride (PROSCAR) 5 MG tablet, TAKE 1 TABLET BY MOUTH EVERY DAY .  glucose blood (ONETOUCH VERIO) test strip, Use to test blood sugars twice  daily. Dx: E11.9 .  Misc Natural Products (GLUCOSAMINE CHOND CMP ADVANCED PO), Take by mouth in the morning and at bedtime. .  ONE TOUCH LANCETS MISC, Use to test blood  sugars. Dx:E11.9 .  tamsulosin (FLOMAX) 0.4 MG CAPS capsule, TAKE 1 CAPSULE BY MOUTH  DAILY .  timolol (TIMOPTIC) 0.5 % ophthalmic solution, 1 drop 2 (two) times daily. .  TRAVATAN Z 0.004 % SOLN ophthalmic solution, Place 1 drop into the left eye at bedtime.    Reviewed prior external information including notes and imaging from  primary care provider As well as notes that were available from care everywhere and other healthcare systems.  Past medical history, social, surgical and family history all reviewed in electronic medical record.  No pertanent information unless stated regarding to the chief complaint.   Review of Systems:  No headache, visual changes, nausea, vomiting, diarrhea, constipation, dizziness, abdominal pain, skin rash, fevers, chills, night sweats, weight loss, swollen lymph nodes, joint swelling, chest pain, shortness of breath, mood changes. POSITIVE muscle aches, body aches  Objective  Blood pressure (!) 160/90, pulse (!) 46, height 6' (1.829 m), weight 255 lb (115.7 kg), SpO2 97 %.   General: No apparent distress alert and oriented x3 mood and affect normal, dressed appropriately.  Sitting comfortably in the chair HEENT: Pupils equal, extraocular movements intact  Respiratory: Patient's speak in full sentences and does not appear short of breath  Gait antalgic walking with the aid of a  rolling walker. MSK: Patient has negative straight leg test but does have tightness of the hamstrings.  Patient is tender to palpation in the paraspinal musculature of the lumbar spine diffusely.  Patient does have the postsurgical changes noted.  Patient does have some increased tenderness over the sacroiliac joints.  No specific other findings noted today.  Most of exam done while he was sitting in the chair.    Impression and Recommendations:     The above documentation has been reviewed and is accurate and complete Lyndal Pulley, DO

## 2020-06-03 DIAGNOSIS — H401132 Primary open-angle glaucoma, bilateral, moderate stage: Secondary | ICD-10-CM | POA: Diagnosis not present

## 2020-06-03 DIAGNOSIS — H2513 Age-related nuclear cataract, bilateral: Secondary | ICD-10-CM | POA: Diagnosis not present

## 2020-06-08 ENCOUNTER — Other Ambulatory Visit: Payer: Self-pay | Admitting: Family Medicine

## 2020-06-24 DIAGNOSIS — M48061 Spinal stenosis, lumbar region without neurogenic claudication: Secondary | ICD-10-CM | POA: Diagnosis not present

## 2020-07-06 ENCOUNTER — Other Ambulatory Visit: Payer: Self-pay

## 2020-07-06 ENCOUNTER — Ambulatory Visit (INDEPENDENT_AMBULATORY_CARE_PROVIDER_SITE_OTHER): Payer: Medicare Other

## 2020-07-06 DIAGNOSIS — E785 Hyperlipidemia, unspecified: Secondary | ICD-10-CM | POA: Diagnosis not present

## 2020-07-06 DIAGNOSIS — N182 Chronic kidney disease, stage 2 (mild): Secondary | ICD-10-CM | POA: Diagnosis not present

## 2020-07-06 DIAGNOSIS — E1122 Type 2 diabetes mellitus with diabetic chronic kidney disease: Secondary | ICD-10-CM | POA: Diagnosis not present

## 2020-07-06 DIAGNOSIS — I10 Essential (primary) hypertension: Secondary | ICD-10-CM

## 2020-07-06 NOTE — Progress Notes (Unsigned)
Chronic Care Management Pharmacy Note  07/07/2020 Name:  Kevin Rogers MRN:  [id number] DOB:  08/16/1931  Subjective: Kevin Rogers is an 87 y.o. year old male who is a primary patient of Hunter, Brayton Mars, MD.  The CCM team was consulted for assistance with disease management and care coordination needs.    Engaged with patient by telephone for follow up visit in response to provider referral for pharmacy case management and/or care coordination services.   Consent to Services:  The patient was given information about Chronic Care Management services, agreed to services, and gave verbal consent prior to initiation of services.  Please see initial visit note for detailed documentation.   Patient Care Team: [name], MD as PCP - General (Family Medicine) [name], MD as Consulting Physician (Neurosurgery) Ralene Bathe, MD as Consulting Physician (Ophthalmology) Wyatt Portela, MD as Consulting Physician (Oncology) Marlaine Hind, MD as Consulting Physician (Physical Medicine and Rehabilitation) Trula Slade, DPM as Consulting Physician (Podiatry) Madelin Rear, [facility] as Pharmacist (Pharmacist)  Recent office visits: 03/16/2020 (PCP): no med changes, referred for 2nd opinion on back pain.  Hospital visits: None in previous 6 months  Objective: Lab Results  Component Value Date   CREATININE 1.43 (H) 03/16/2020   CREATININE 1.48 (H) 02/07/2020   CREATININE 1.55 (H) 11/11/2019   Lab Results  Component Value Date   HGBA1C 6.1 (H) 03/16/2020   Last diabetic Eye exam:  Lab Results  Component Value Date/Time   HMDIABEYEEXA No Retinopathy 01/29/2020 07:59 AM    Last diabetic Foot exam: No results found for: HMDIABFOOTEX     Component Value Date/Time   CHOL 155 03/16/2020 1630   TRIG 131 03/16/2020 1630   HDL 47 03/16/2020 1630   CHOLHDL 3.3 03/16/2020 1630   VLDL 25.4 03/14/2019 1033   LDLCALC 85 03/16/2020 1630   LDLDIRECT 76.0 11/23/2017 0919    Hepatic Function Latest Ref Rng & Units 03/16/2020 02/07/2020 11/11/2019  Total Protein 6.1 - 8.1 g/dL 6.3 6.5 5.9(L)  Albumin 3.5 - 5.0 g/dL - 3.7 4.0  AST 10 - 35 U/L 11 13(L) 15  ALT 9 - 46 U/L 13 13 15   Alk Phosphatase 38 - 126 U/L - 93 81  Total Bilirubin 0.2 - 1.2 mg/dL 0.8 1.0 0.6  Bilirubin, Direct 0.0 - 0.3 mg/dL - - -   Lab Results  Component Value Date/Time   TSH 0.93 07/08/2015 08:18 AM   TSH 0.618 02/10/2015 04:30 AM   TSH 2.35 07/23/2012 08:17 AM   CBC Latest Ref Rng & Units 03/16/2020 02/07/2020 11/11/2019  WBC 3.8 - 10.8 Thousand/uL 11.2(H) 12.0(H) 10.6(H)  Hemoglobin 13.2 - 17.1 g/dL 12.8(L) 12.5(L) 12.6(L)  Hematocrit 38.5 - 50.0 % 38.5 38.9(L) 38.2(L)  Platelets 140 - 400 Thousand/uL 161 151 139.0(L)   No results found for: VD25OH  Clinical ASCVD: No  The ASCVD Risk score Mikey Bussing DC Jr., et al., 2013) failed to calculate for the following reasons:   The 2013 ASCVD risk score is only valid for ages 72 to 1    Social History   Tobacco Use  Smoking Status Former Smoker  . Packs/day: 0.25  . Years: 20.00  . Pack years: 5.00  . Types: Cigarettes  . Quit date: 09/28/1995  . Years since quitting: 24.7  Smokeless Tobacco Never Used   BP Readings from Last 3 Encounters:  05/27/20 (!) 160/90  04/15/20 122/70  03/16/20 130/80   Pulse Readings from Last 3 Encounters:  05/27/20 (!) 46  04/15/20 (!) 52  03/16/20 (!) 55   Wt Readings from Last 3 Encounters:  05/27/20 255 lb (115.7 kg)  04/15/20 252 lb (114.3 kg)  03/16/20 251 lb 12.8 oz (114.2 kg)    Assessment: Review of patient past medical history, allergies, medications, health status, including review of consultants reports, laboratory and other test data, was performed as part of comprehensive evaluation and provision of chronic care management services.   SDOH:  (Social Determinants of Health) assessments and interventions performed:   CCM Care Plan No Known Allergies Medications Reviewed Today     Reviewed by Madelin Rear, [facility] (Pharmacist) on 07/07/20 at 1044  Med List Status: <None>  Medication Order Taking? Sig Documenting Provider Last Dose Status Informant  acetaminophen (TYLENOL) 500 MG tablet [id number] No Take 500-1,000 mg by mouth every 6 (six) hours as needed (pain). Reported on 07/15/2015 [provider] Taking Active Self  atorvastatin (LIPITOR) 10 MG tablet [id number] No TAKE 1 TABLET BY MOUTH ONCE WEEKLY [name], MD Taking Active   BD PEN NEEDLE NANO 2ND GEN 32G X 4 MM MISC [id number] No USE AS DIRECTED [name], MD Taking Active   finasteride (PROSCAR) 5 MG tablet [id number] No TAKE 1 TABLET BY MOUTH EVERY DAY [name], MD Taking Active   glipiZIDE (GLUCOTROL XL) 5 MG 24 hr tablet [id number] No TAKE 1 TABLET BY MOUTH EVERY DAY [name], MD Taking Active   glucose blood [facility] VERIO) test strip [id number] No Use to test blood sugars twice  daily. Dx: E11.9 [name], MD Taking Active   HYDROcodone-acetaminophen (NORCO/VICODIN) 5-325 MG tablet [id number] No Take 1-2 tablets by mouth every 6 (six) hours as needed. [provider] Taking Active   insulin degludec (TRESIBA FLEXTOUCH) 100 UNIT/ML FlexTouch Pen [id number] No Inject 0.2-0.3 mLs (20-30 Units total) into the skin daily. We will also sign for any needed supplies [name], MD Taking Active   losartan (COZAAR) 100 MG tablet [id number] No TAKE 1 TABLET BY MOUTH  DAILY [name], MD Taking Active   Misc Natural Products (GLUCOSAMINE CHOND CMP ADVANCED PO) [id number] No Take by mouth in the morning and at bedtime. [provider] Taking Active Self  ONE TOUCH LANCETS MISC [id number] No Use to test blood sugars. Dx:E11.9 [name], MD Taking Active Self  tamsulosin (FLOMAX) 0.4 MG CAPS capsule [id number] No TAKE 1 CAPSULE BY MOUTH  DAILY [name], MD Taking Active   timolol (TIMOPTIC) 0.5 % ophthalmic solution [id number] No 1 drop 2  (two) times daily. [provider] Taking Active   TRAVATAN Z 0.004 % SOLN ophthalmic solution [id number] No Place 1 drop into the left eye at bedtime.  [provider] Taking Active Self           Med Note Leonides Schanz, JENNIFER L   Wed Dec 03, 2014  9:34 AM) Received from: External Pharmacy  vitamin B-12 (CYANOCOBALAMIN) 1000 MCG tablet [id number] No Take 1,000 mcg by mouth daily. [provider] Taking Active          Patient Active Problem List   Diagnosis Date Noted  . Enteritis due to Clostridium difficile   . Discitis of lumbar region   . History of osteomyelitis of lumbar vertebrae   . Hyperlipidemia   . CKD (chronic kidney disease), stage III ([place]) 01/13/2015  . Lumbar spinal stenosis 06/26/2014  . BPH (benign prostatic hyperplasia) 06/24/2014  . B12 deficiency 12/12/2008  . Diabetic polyneuropathy (  [place]) 12/12/2008  . ERECTILE DYSFUNCTION 03/18/2008  . Chronic lymphocytic leukemia ([place]) 03/08/2007  . Diabetes mellitus type II, controlled ([place]) 03/08/2007  . Gout 03/08/2007  . Glaucoma 03/08/2007  . Essential hypertension 03/08/2007  . Osteoarthritis 03/08/2007   Immunization History  Administered Date(s) Administered  . Fluad Quad(high Dose 65+) 01/02/2019, 02/27/2020  . Influenza Split 02/10/2011, 01/11/2012  . Influenza Whole 03/15/2007, 01/31/2008, 02/04/2009, 02/01/2010  . Influenza, High Dose Seasonal PF 01/18/2016, 01/24/2017, 01/29/2018  . Influenza,inj,Quad PF,6+ Mos 02/12/2014, 01/13/2015  . Moderna Sars-Covid-2 Vaccination 05/24/2019, 06/10/2019, 02/29/2020  . Pneumococcal Conjugate-13 03/23/2015  . Pneumococcal Polysaccharide-23 03/24/2009, 02/12/2014  . Td 03/09/2007  . Tdap 09/19/2017   Conditions to be addressed/monitored: HTN, HLD and DMII  There are no care plans that you recently modified to display for this patient.  Current Barriers:  . Potential opportunity for DM regimen optimization  Pharmacist Clinical Goal(s):  [name] Over  the next 365 days, patient will verbalize ability to afford treatment regimen through collaboration with PharmD and provider.   Interventions: . 1:1 collaboration with [name], MD regarding development and update of comprehensive plan of care as evidenced by provider attestation and co-signature . Inter-disciplinary care team collaboration (see longitudinal plan of care) . Comprehensive medication review performed; medication list updated in electronic medical record  Hypertension (BP goal <140/90) -Controlled -Current treatment: . Losartan 100 mg tablet - taking half tablet once daily  -Medications previously tried: valsartan 80 mg once daily  -Current home readings: not currently testing. -Current exercise habits: limited by back pain -Denies hypotensive/hypertensive symptoms -Educated on BP goals and benefits of medications for prevention of heart attack, stroke and kidney damage; Daily salt intake goal < 2300 mg; Exercise goal of 150 minutes per week; Importance of home blood pressure monitoring; -Counseled to monitor BP at home once every 1-2 weeks, document, and provide log at future appointments -Counseled on diet and exercise extensively Recommended to continue current medication  Hyperlipidemia: (LDL goal < 100) -Controlled -Current treatment: . Atorvastatin 10 mg once WEEKLY -Medications previously tried: n/a  -Educated on Cholesterol goals;  -Counseled on diet and exercise extensively Recommended to continue current medication  Type II Diabetes (A1c goal <7%) -Controlled  -most recent a1c 6.1 03/2020. a1c has been typically 6-7% last 2 years with exception of 10.9 07/22/2019 (was receiving steroids at the time). No active steroid/cll tx.  -CKD III, no CVD -on ARB  -insulin 0.17-.0.26 units/kg -Current medications:  [name] Glipizide XL 5 mg once daily . Tresiba 20-30 units daily into the skin once daily  -Current home glucose readings . fasting glucose: 120s on  average. Only testing fasting BG first thing in AM. . post prandial glucose: doesn't check  -Denies hypoglycemic/hyperglycemic symptoms -Current exercise: see HTN -Educated onA1c and blood sugar goals; Exercise goal of 150 minutes per week; Prevention and management of hypoglycemic episodes; -Counseled to check feet daily and get yearly eye exams -Counseled on diet and exercise extensively  -Consider stopping Glipizide XL 5 mg once daily and starting Ozempic 0.25 mg SQ once weekly x 4 weeks- could then work towards optimizing Ozempic and reducing insulin further.    Patient Goals/Self-Care Activities . Over the next 365 days, patient will:  - take medications as prescribed collaborate with provider on medication access solutions  Medication Assistance: None required.  Patient affirms current coverage meets needs.  Patient's preferred pharmacy is:  [id number] [place] [id number]: [phone]  Fax: [place], [address], [address] [place], [place] [address] Phone: [phone] Fax: [phone]  [id number] [id number]: [phone]: [phone] Follow Up:  Patient agrees to Care Plan and Follow-up.  Plan: Telephone follow up appointment with care management team member scheduled for:  3 months or sooner depending on PCP visit recommendations.   Future Appointments  Date Time Provider [place]ata 08/07/2020  3:00 PM HMarin Olp MD [facility]  10/12/2020  3:30 PM [facility] CCM PHARMACIST [facility]  02/05/2021  9:30 AM [facility] LAB [facility] None  02/05/2021 10:00 AM Shadad, FMathis Dad MD [facility] None    JMadelin Rear Pharm.D., BCGP Clinical Pharmacist [phone]

## 2020-07-07 NOTE — Patient Instructions (Signed)
Mr. Inge,  Thank you for taking the time to review your medications with me today.  I have included our care plan/goals in the following pages. Please review and call me at [phone] with any questions!  Thanks! Ellin Mayhew, Pharm.D., BCGP Clinical Pharmacist [facility] [place] [phone] Patient Care Plan: Coin Plan    Problem Identified: HLD DMII HTN     Long-Range Goal: Disease Management   Start Date: 07/06/2020  Expected End Date: 07/06/2021  This Visit's Progress: On track  Priority: High  Note:   Hypertension (BP goal <140/90) -Controlled -Current treatment: . Losartan 100 mg tablet - taking half tablet once daily  -Medications previously tried: valsartan 80 mg once daily  -Current home readings: not currently testing. -Current exercise habits: limited by back pain -Denies hypotensive/hypertensive symptoms -Educated on BP goals and benefits of medications for prevention of heart attack, stroke and kidney damage; Daily salt intake goal < 2300 mg; Exercise goal of 150 minutes per week; Importance of home blood pressure monitoring; -Counseled to monitor BP at home once every 1-2 weeks, document, and provide log at future appointments -Counseled on diet and exercise extensively Recommended to continue current medication  Hyperlipidemia: (LDL goal < 100) -Controlled -Current treatment: . Atorvastatin 10 mg once weekly -Medications previously tried: n/a  -Educated on Cholesterol goals;  -Counseled on diet and exercise extensively Recommended to continue current medication  Diabetes (A1c goal <7%) -Controlled  -GFR 40s, no CVD -insulin 0.17-.0.26 units/kg -Current medications: [name] Glipizide XL 5 mg once daily . Tresiba 20-30 units daily into the skin once daily  -Current home glucose readings . fasting glucose: 120s on average. Only testing fasting BG first thing in AM. . post prandial glucose:  doesn't check  -Denies hypoglycemic/hyperglycemic symptoms -Current exercise: see HTN -Educated onA1c and blood sugar goals; Exercise goal of 150 minutes per week; Prevention and management of hypoglycemic episodes; -Counseled to check feet daily and get yearly eye exams -Counseled on diet and exercise extensively  -Consider stopping Glipizide XL 5 mg once daily and starting Ozempic 0.25 mg SQ once weekly x 4 weeks- could then work towards optimizing Ozempic and reducing insulin further.        The patient verbalized understanding of instructions provided today and agreed to receive a MyChart copy of patient instruction and/or educational materials. Telephone follow up appointment with pharmacy team member scheduled for: See next appointment with "Care Management Staff" under "What's Next" below.

## 2020-07-13 DIAGNOSIS — M961 Postlaminectomy syndrome, not elsewhere classified: Secondary | ICD-10-CM | POA: Diagnosis not present

## 2020-07-13 DIAGNOSIS — M5416 Radiculopathy, lumbar region: Secondary | ICD-10-CM | POA: Diagnosis not present

## 2020-07-13 DIAGNOSIS — M47814 Spondylosis without myelopathy or radiculopathy, thoracic region: Secondary | ICD-10-CM | POA: Diagnosis not present

## 2020-07-13 DIAGNOSIS — Z79899 Other long term (current) drug therapy: Secondary | ICD-10-CM | POA: Diagnosis not present

## 2020-07-13 DIAGNOSIS — M48061 Spinal stenosis, lumbar region without neurogenic claudication: Secondary | ICD-10-CM | POA: Diagnosis not present

## 2020-07-13 DIAGNOSIS — M461 Sacroiliitis, not elsewhere classified: Secondary | ICD-10-CM | POA: Diagnosis not present

## 2020-07-13 DIAGNOSIS — M7918 Myalgia, other site: Secondary | ICD-10-CM | POA: Diagnosis not present

## 2020-07-31 ENCOUNTER — Other Ambulatory Visit: Payer: Self-pay | Admitting: Family Medicine

## 2020-08-01 ENCOUNTER — Other Ambulatory Visit: Payer: Self-pay | Admitting: Family Medicine

## 2020-08-06 NOTE — Patient Instructions (Addendum)
Please stop by lab before you go If you have mychart- we will send your results within 3 business days of [place] receiving them.  If you do not have mychart- we will call you about results within 5 business days of [place] receiving them.  *please also note that you will see labs on mychart as soon as they post. I will later go in and write notes on them- will say "notes from Dr. Yong Channel"  No changes today unless labs lead [place] to make changes   Recommended follow up: Return in about 6 months (around 02/06/2021) for physical or sooner if needed.

## 2020-08-06 NOTE — Progress Notes (Signed)
Phone [phone] In person visit   Subjective:   Kevin Rogers is a 87 y.o. year old very pleasant male patient who presents for/with See problem oriented charting Chief Complaint  Patient presents with  . Hypertension  . Chronic Kidney Disease    Stage 3  . Diabetes  . Hyperlipidemia  . Vitamin B12 Deficiency   This visit occurred during the SARS-CoV-2 public health emergency.  Safety protocols were in place, including screening questions prior to the visit, additional usage of staff PPE, and extensive cleaning of exam room while observing appropriate contact time as indicated for disinfecting solutions.   Past Medical History-  Patient Active Problem List   Diagnosis Date Noted  . Chronic lymphocytic leukemia ([place]) 03/08/2007    Priority: High  . Diabetes mellitus type II, controlled ([place]) 03/08/2007    Priority: High  . History of osteomyelitis of lumbar vertebrae     Priority: Medium  . Hyperlipidemia     Priority: Medium  . CKD (chronic kidney disease), stage III ([place]) 01/13/2015    Priority: Medium  . Lumbar spinal stenosis 06/26/2014    Priority: Medium  . BPH (benign prostatic hyperplasia) 06/24/2014    Priority: Medium  . Gout 03/08/2007    Priority: Medium  . Essential hypertension 03/08/2007    Priority: Medium  . Enteritis due to Clostridium difficile     Priority: Low  . Discitis of lumbar region     Priority: Low  . B12 deficiency 12/12/2008    Priority: Low  . Diabetic polyneuropathy ([place]) 12/12/2008    Priority: Low  . ERECTILE DYSFUNCTION 03/18/2008    Priority: Low  . Glaucoma 03/08/2007    Priority: Low  . Osteoarthritis 03/08/2007    Priority: Low    Medications- reviewed and updated Current Outpatient Medications  Medication Sig Dispense Refill  . atorvastatin (LIPITOR) 10 MG tablet TAKE 1 TABLET BY MOUTH ONCE WEEKLY 13 tablet 3  . BD PEN NEEDLE NANO 2ND GEN 32G X 4 MM MISC USE AS DIRECTED 100 each 1  . finasteride (PROSCAR) 5 MG  tablet TAKE 1 TABLET BY MOUTH EVERY DAY 90 tablet 3  . glipiZIDE (GLUCOTROL XL) 5 MG 24 hr tablet TAKE 1 TABLET BY MOUTH EVERY DAY 90 tablet 1  . glucose blood (ONETOUCH VERIO) test strip Use to test blood sugars twice  daily. Dx: E11.9 100 each 12  . HYDROcodone-acetaminophen (NORCO/VICODIN) 5-325 MG tablet Take 1-2 tablets by mouth every 6 (six) hours as needed.    . insulin degludec (TRESIBA FLEXTOUCH) 100 UNIT/ML FlexTouch Pen Inject 0.2-0.3 mLs (20-30 Units total) into the skin daily. We will also sign for any needed supplies 9 pen 5  . losartan (COZAAR) 100 MG tablet TAKE 1 TABLET BY MOUTH  DAILY (Patient taking differently: Take 50 mg by mouth daily.) 90 tablet 3  . Misc Natural Products (GLUCOSAMINE CHOND CMP ADVANCED PO) Take by mouth in the morning and at bedtime.    . ONE TOUCH LANCETS MISC Use to test blood sugars. Dx:E11.9 100 each 11  . tamsulosin (FLOMAX) 0.4 MG CAPS capsule TAKE 1 CAPSULE BY MOUTH  DAILY 90 capsule 3  . timolol (TIMOPTIC) 0.5 % ophthalmic solution 1 drop 2 (two) times daily.    . TRAVATAN Z 0.004 % SOLN ophthalmic solution Place 1 drop into the left eye at bedtime.     . vitamin B-12 (CYANOCOBALAMIN) 1000 MCG tablet Take 1,000 mcg by mouth daily.    [name] acetaminophen (TYLENOL) 500 MG tablet  Take 500-1,000 mg by mouth every 6 (six) hours as needed (pain). Reported on 07/15/2015 (Patient not taking: Reported on 08/07/2020)     No current facility-administered medications for this visit.     Objective:  BP (!) 114/56   Pulse (!) 57   Temp 97.7 F (36.5 C) (Temporal)   Ht 6' (1.829 m)   Wt 250 lb 12.8 oz (113.8 kg)   SpO2 98%   BMI 34.01 kg/m  Gen: NAD, resting comfortably CV: Slightly bradycardic but regular no murmurs rubs or gallops Lungs: CTAB no crackles, wheeze, rhonchi Abdomen: soft/nontender/nondistended/normal bowel sounds.  Ext: trace edema Skin: warm, dry     Assessment and Plan   #hypertension/CKD stage III S: medication: Losartan 100 mg  daily Home readings #s: does not check at home  GFR typically in the 50s-previously on meloxicam but stopped in 2017 BP Readings from Last 3 Encounters:  08/07/20 (!) 114/56  05/27/20 (!) 160/90  04/15/20 122/70  A/P: blood pressure stable- continue current meds   CKD stage III-hopefully stable-update labs today   # Diabetes S: Medication: currently on tresiba 16 units. Did need 17 units when he took steroid injection  Nto longer using insulin-we have been titrating Lantus up-#. Ran out about a week ago. Taking Glipizide CBGs- 7 day aveg 125 and 90 day average 123 mostly fasting. Has had some recent #s above 150 with steroid injection Exercise and diet- No exercise due to immobility and back pain Lab Results  Component Value Date   HGBA1C 6.1 (H) 03/16/2020   A/P: hopefully controlled- update a1c with labs today -we considered stopping glipizide and starting ozempic per suggestion by Pharmacist Madelin Rear- in end opted out for now- will keep in our back pocket  Patient also likely with diabetic polyneuropathy-tingling in the feet and shooting pains-also may be related to his back pain/lumbar stenosis. Stable on hydrocodone   #hyperlipidemia S: Medication:Atorvastatin 10mg  once a week. Past primary prevention age range-we are trying to target LDL at least around 100 Lab Results  Component Value Date   CHOL 155 03/16/2020   HDL 47 03/16/2020   LDLCALC 85 03/16/2020   LDLDIRECT 76.0 11/23/2017   TRIG 131 03/16/2020   CHOLHDL 3.3 03/16/2020   A/P: given age over 72- do not feel strongly about increasing dose unless LDL gets above 100- check next visit   #CLL-continues yearly visits with oncology. Diagnosed in 2000. We will check CBC with differential today to make sure stable.   #BPH S: Patient continues to see benefit with finasteride and Flomax A/P: finds helpful- continue current meds   #Lumbar spinal stenosis S: Limits mobility-chronic back pain. Currently taking  hydrocodone  A/P: stable- through Dr Brien Few   # B12 deficiency S: Current treatment/medication (oral vs. IM): Oral 1000 mcg Lab Results  Component Value Date   [id number] 093 03/14/2019  A/P: hopefully controlled- update b12 with labs today   Recommended follow up: Return in about 6 months (around 02/06/2021) for physical or sooner if needed. Future Appointments  Date Time Provider [place]  10/12/2020  3:30 PM [facility] CCM PHARMACIST [facility]  02/05/2021  9:30 AM [facility] None  02/05/2021 10:00 AM Shadad, Mathis Dad, MD [facility] None    Lab/Order associations:   ICD-10-CM   1. Essential hypertension  I10   2. Controlled type 2 diabetes mellitus with stage 2 chronic kidney disease, without long-term current use of insulin (HCC)  E11.22 Hemoglobin A1c   N18.2 CBC With Differential/Platelet  COMPLETE METABOLIC PANEL WITH GFR  3. Stage 3 chronic kidney disease, unspecified whether stage 3a or 3b CKD (HCC)  N18.30   4. Hyperlipidemia, unspecified hyperlipidemia type  E78.5   5. Idiopathic gout of multiple sites, unspecified chronicity  M10.09   6. B12 deficiency  E53.8 Vitamin B12  7. Chronic lymphocytic leukemia (HCC) Chronic C91.10     No orders of the defined types were placed in this encounter.  Return precautions advised.  Garret Reddish, MD

## 2020-08-07 ENCOUNTER — Other Ambulatory Visit: Payer: Self-pay

## 2020-08-07 ENCOUNTER — Encounter: Payer: Self-pay | Admitting: Family Medicine

## 2020-08-07 ENCOUNTER — Ambulatory Visit (INDEPENDENT_AMBULATORY_CARE_PROVIDER_SITE_OTHER): Payer: Medicare Other | Admitting: Family Medicine

## 2020-08-07 VITALS — BP 114/56 | HR 57 | Temp 97.7°F | Ht 72.0 in | Wt 250.8 lb

## 2020-08-07 DIAGNOSIS — N183 Chronic kidney disease, stage 3 unspecified: Secondary | ICD-10-CM

## 2020-08-07 DIAGNOSIS — E538 Deficiency of other specified B group vitamins: Secondary | ICD-10-CM | POA: Diagnosis not present

## 2020-08-07 DIAGNOSIS — C9110 Chronic lymphocytic leukemia of B-cell type not having achieved remission: Secondary | ICD-10-CM

## 2020-08-07 DIAGNOSIS — N182 Chronic kidney disease, stage 2 (mild): Secondary | ICD-10-CM | POA: Diagnosis not present

## 2020-08-07 DIAGNOSIS — E1122 Type 2 diabetes mellitus with diabetic chronic kidney disease: Secondary | ICD-10-CM

## 2020-08-07 DIAGNOSIS — E785 Hyperlipidemia, unspecified: Secondary | ICD-10-CM | POA: Diagnosis not present

## 2020-08-07 DIAGNOSIS — M1009 Idiopathic gout, multiple sites: Secondary | ICD-10-CM

## 2020-08-07 DIAGNOSIS — I10 Essential (primary) hypertension: Secondary | ICD-10-CM

## 2020-08-08 LAB — COMPLETE METABOLIC PANEL WITH GFR
AG Ratio: 2 (calc) (ref 1.0–2.5)
ALT: 18 U/L (ref 9–46)
AST: 12 U/L (ref 10–35)
Albumin: 4.2 g/dL (ref 3.6–5.1)
Alkaline phosphatase (APISO): 66 U/L (ref 35–144)
BUN/Creatinine Ratio: 21 (calc) (ref 6–22)
BUN: 33 mg/dL — ABNORMAL HIGH (ref 7–25)
CO2: 27 mmol/L (ref 20–32)
Calcium: 10.2 mg/dL (ref 8.6–10.3)
Chloride: 105 mmol/L (ref 98–110)
Creat: 1.54 mg/dL — ABNORMAL HIGH (ref 0.70–1.11)
GFR, Est African American: 47 mL/min/{1.73_m2} — ABNORMAL LOW (ref >=60)
GFR, Est Non African American: 40 mL/min/{1.73_m2} — ABNORMAL LOW (ref >=60)
Globulin: 2.1 g/dL (calc) (ref 1.9–3.7)
Glucose, Bld: 113 mg/dL — ABNORMAL HIGH (ref 65–99)
Potassium: 5.2 mmol/L (ref 3.5–5.3)
Sodium: 140 mmol/L (ref 135–146)
Total Bilirubin: 0.8 mg/dL (ref 0.2–1.2)
Total Protein: 6.3 g/dL (ref 6.1–8.1)

## 2020-08-08 LAB — CBC WITH DIFFERENTIAL/PLATELET
Absolute Monocytes: 342 cells/uL (ref 200–950)
Basophils Absolute: 18 cells/uL (ref 0–200)
Basophils Relative: 0.1 %
Eosinophils Absolute: 72 cells/uL (ref 15–500)
Eosinophils Relative: 0.4 %
HCT: 39.9 % (ref 38.5–50.0)
Hemoglobin: 13.5 g/dL (ref 13.2–17.1)
Lymphs Abs: 13734 cells/uL — ABNORMAL HIGH (ref 850–3900)
MCH: 29.5 pg (ref 27.0–33.0)
MCHC: 33.8 g/dL (ref 32.0–36.0)
MCV: 87.3 fL (ref 80.0–100.0)
MPV: 10.7 fL (ref 7.5–12.5)
Monocytes Relative: 1.9 %
Neutro Abs: 3834 cells/uL (ref 1500–7800)
Neutrophils Relative %: 21.3 %
Platelets: 144 10*3/uL (ref 140–400)
RBC: 4.57 10*6/uL (ref 4.20–5.80)
RDW: 14.2 % (ref 11.0–15.0)
Total Lymphocyte: 76.3 %
WBC: 18 10*3/uL — ABNORMAL HIGH (ref 3.8–10.8)

## 2020-08-08 LAB — VITAMIN B12: Vitamin B-12: 1639 pg/mL — ABNORMAL HIGH (ref 200–1100)

## 2020-08-08 LAB — HEMOGLOBIN A1C
Hgb A1c MFr Bld: 6.6 % of total Hgb — ABNORMAL HIGH (ref <5.7)
Mean Plasma Glucose: 143 mg/dL
eAG (mmol/L): 7.9 mmol/L

## 2020-08-14 ENCOUNTER — Other Ambulatory Visit: Payer: Self-pay | Admitting: Family Medicine

## 2020-08-24 ENCOUNTER — Other Ambulatory Visit: Payer: Self-pay | Admitting: Family Medicine

## 2020-09-13 ENCOUNTER — Other Ambulatory Visit: Payer: Self-pay | Admitting: Family Medicine

## 2020-09-14 ENCOUNTER — Telehealth: Payer: Self-pay

## 2020-09-14 NOTE — Chronic Care Management (AMB) (Signed)
Chronic Care Management Pharmacy Assistant   Name: Kevin Rogers  MRN: [id number] DOB: [birth year]  Reason for Encounter: Diabetes Mellitus Disease State Call  Recent office visits:  08/07/20- Garret Reddish, MD- Chronic conditions addressed, no medication changes,  follow up 6 months   Recent consult visits:  No visits noted  Hospital visits:  None in previous 6 months  Medications: Outpatient Encounter Medications as of 09/14/2020  Medication Sig Note  . acetaminophen (TYLENOL) 500 MG tablet Take 500-1,000 mg by mouth every 6 (six) hours as needed (pain). Reported on 07/15/2015 (Patient not taking: Reported on 08/07/2020)   . atorvastatin (LIPITOR) 10 MG tablet TAKE 1 TABLET BY MOUTH ONCE WEEKLY   . BD PEN NEEDLE NANO 2ND GEN 32G X 4 MM MISC USE AS DIRECTED   . finasteride (PROSCAR) 5 MG tablet TAKE 1 TABLET BY MOUTH EVERY DAY   . glipiZIDE (GLUCOTROL XL) 5 MG 24 hr tablet TAKE 1 TABLET BY MOUTH EVERY DAY   . glucose blood (ONETOUCH VERIO) test strip Use to test blood sugars twice  daily. Dx: E11.9   . HYDROcodone-acetaminophen (NORCO/VICODIN) 5-325 MG tablet Take 1-2 tablets by mouth every 6 (six) hours as needed.   . insulin degludec (TRESIBA FLEXTOUCH) 100 UNIT/ML FlexTouch Pen Inject 0.2-0.3 mLs (20-30 Units total) into the skin daily. We will also sign for any needed supplies   . losartan (COZAAR) 100 MG tablet TAKE 1 TABLET BY MOUTH  DAILY (Patient taking differently: Take 50 mg by mouth daily.)   . Misc Natural Products (GLUCOSAMINE CHOND CMP ADVANCED PO) Take by mouth in the morning and at bedtime.   . ONE TOUCH LANCETS MISC Use to test blood sugars. Dx:E11.9   . tamsulosin (FLOMAX) 0.4 MG CAPS capsule TAKE 1 CAPSULE BY MOUTH  DAILY   . timolol (TIMOPTIC) 0.5 % ophthalmic solution 1 drop 2 (two) times daily.   . TRAVATAN Z 0.004 % SOLN ophthalmic solution Place 1 drop into the left eye at bedtime.  12/03/2014: Received from: External Pharmacy  . vitamin B-12 (CYANOCOBALAMIN)  1000 MCG tablet Take 1,000 mcg by mouth daily.    No facility-administered encounter medications on file as of 09/14/2020.   Recent Relevant Labs: Lab Results  Component Value Date/Time   HGBA1C 6.6 (H) 08/07/2020 04:01 PM   HGBA1C 6.1 (H) 03/16/2020 04:30 PM   MICROALBUR 1.3 07/08/2015 08:18 AM   MICROALBUR 1.2 07/23/2012 08:17 AM    Kidney Function Lab Results  Component Value Date/Time   CREATININE 1.54 (H) 08/07/2020 04:01 PM   CREATININE 1.43 (H) 03/16/2020 04:30 PM   CREATININE 1.3 06/09/2016 09:34 AM   CREATININE 1.5 (H) 12/08/2015 08:29 AM   GFR 51.74 (L) 11/11/2019 08:26 AM   GFRNONAA 40 (L) 08/07/2020 04:01 PM   GFRAA 47 (L) 08/07/2020 04:01 PM   Current antihyperglycemic regimen: Glipizide XL 5 mg once daily Tresiba 20-30 units daily into the skin once daily   What recent interventions/DTPs have been made to improve glycemic control:  No recent interventions  Have there been any recent hospitalizations or ED visits since last visit with CPP? No  Patient denies hypoglycemic symptoms  Patient denies hyperglycemic symptoms  How often are you checking your blood sugar?  Patient stated he checks his BS daily before breakfast  What are your blood sugars ranging?  o Fasting: 90,115,117,120-125,140 o Before meals: n/a o After meals: n/a o Bedtime: n/a  During the week, how often does your blood glucose drop below 70? Never  Are you checking your feet daily/regularly?   Patient stated he does regularly check his feet and they look good. He does report that regardless of the fact that he greases his feet every morning, they still end up dry and scaly.   Adherence Review: Is the patient currently on a STATIN medication? Yes Is the patient currently on ACE/ARB medication? Yes  Does the patient have >5 day gap between last estimated fill dates? Yes  Glipizide XL 5 mg - 90 DS last filled 08/17/20 Tresiba 20-30 units - 100 DS last filled 04/03/20- Patient has 2 pens  remaining  Star Rating Drugs:  Atorvastatin 10 mg- 90 DS last filled 08/03/20 Losartan 100 mg- 90 DS last filled 06/25/20  Wilford Sports CPA, CMA

## 2020-10-08 DIAGNOSIS — M461 Sacroiliitis, not elsewhere classified: Secondary | ICD-10-CM | POA: Diagnosis not present

## 2020-10-08 DIAGNOSIS — M47814 Spondylosis without myelopathy or radiculopathy, thoracic region: Secondary | ICD-10-CM | POA: Diagnosis not present

## 2020-10-08 DIAGNOSIS — M961 Postlaminectomy syndrome, not elsewhere classified: Secondary | ICD-10-CM | POA: Diagnosis not present

## 2020-10-08 DIAGNOSIS — M5416 Radiculopathy, lumbar region: Secondary | ICD-10-CM | POA: Diagnosis not present

## 2020-10-08 DIAGNOSIS — I10 Essential (primary) hypertension: Secondary | ICD-10-CM | POA: Diagnosis not present

## 2020-10-08 DIAGNOSIS — M48061 Spinal stenosis, lumbar region without neurogenic claudication: Secondary | ICD-10-CM | POA: Diagnosis not present

## 2020-10-08 DIAGNOSIS — M7918 Myalgia, other site: Secondary | ICD-10-CM | POA: Diagnosis not present

## 2020-10-08 DIAGNOSIS — Z79899 Other long term (current) drug therapy: Secondary | ICD-10-CM | POA: Diagnosis not present

## 2020-10-12 ENCOUNTER — Telehealth: Payer: Medicare Other

## 2020-10-20 ENCOUNTER — Ambulatory Visit (INDEPENDENT_AMBULATORY_CARE_PROVIDER_SITE_OTHER): Payer: Medicare Other

## 2020-10-20 DIAGNOSIS — E1122 Type 2 diabetes mellitus with diabetic chronic kidney disease: Secondary | ICD-10-CM | POA: Diagnosis not present

## 2020-10-20 DIAGNOSIS — N182 Chronic kidney disease, stage 2 (mild): Secondary | ICD-10-CM

## 2020-10-20 DIAGNOSIS — I10 Essential (primary) hypertension: Secondary | ICD-10-CM | POA: Diagnosis not present

## 2020-10-20 MED ORDER — TRESIBA FLEXTOUCH 100 UNIT/ML ~~LOC~~ SOPN: PEN_INJECTOR | SUBCUTANEOUS | 3 refills | Status: DC

## 2020-10-20 NOTE — Progress Notes (Signed)
Chronic Care Management Pharmacy Note  10/20/2020 Name:  Kevin Rogers MRN:  [id number] DOB:  April 25, 1932  Subjective: Kevin Rogers is an 87 y.o. year old male who is a primary patient of Hunter, Brayton Mars, MD.  The CCM team was consulted for assistance with disease management and care coordination needs.    Engaged with patient by telephone for follow up visit in response to provider referral for pharmacy case management and/or care coordination services.   Consent to Services:  The patient was given information about Chronic Care Management services, agreed to services, and gave verbal consent prior to initiation of services.  Please see initial visit note for detailed documentation.   Patient Care Team: [name], MD as PCP - General (Family Medicine) [name], MD as Consulting Physician (Neurosurgery) Ralene Bathe, MD as Consulting Physician (Ophthalmology) Wyatt Portela, MD as Consulting Physician (Oncology) Marlaine Hind, MD as Consulting Physician (Physical Medicine and Rehabilitation) Trula Slade, DPM as Consulting Physician (Podiatry) Madelin Rear, [facility] as Pharmacist (Pharmacist) Hospital visits: Objective: Lab Results  Component Value Date   CREATININE 1.54 (H) 08/07/2020   CREATININE 1.43 (H) 03/16/2020   CREATININE 1.48 (H) 02/07/2020   Lab Results  Component Value Date   HGBA1C 6.6 (H) 08/07/2020   Last diabetic Eye exam:  Lab Results  Component Value Date/Time   HMDIABEYEEXA No Retinopathy 01/29/2020 07:59 AM    Last diabetic Foot exam: No results found for: HMDIABFOOTEX     Component Value Date/Time   CHOL 155 03/16/2020 1630   TRIG 131 03/16/2020 1630   HDL 47 03/16/2020 1630   CHOLHDL 3.3 03/16/2020 1630   VLDL 25.4 03/14/2019 1033   LDLCALC 85 03/16/2020 1630   LDLDIRECT 76.0 11/23/2017 0919   Hepatic Function Latest Ref Rng & Units 08/07/2020 03/16/2020 02/07/2020  Total Protein 6.1 - 8.1 g/dL 6.3 6.3 6.5  Albumin 3.5 -  5.0 g/dL - - 3.7  AST 10 - 35 U/L 12 11 13(L)  ALT 9 - 46 U/L _0 Alk Phosphatase 38 - 126 U/L - - 93  Total Bilirubin 0.2 - 1.2 mg/dL 0.8 0.8 1.0  Bilirubin, Direct 0.0 - 0.3 mg/dL - - -   Lab Results  Component Value Date/Time   TSH 0.93 07/08/2015 08:18 AM   TSH 0.618 02/10/2015 04:30 AM   TSH 2.35 07/23/2012 08:17 AM   CBC Latest Ref Rng & Units 08/07/2020 03/16/2020 02/07/2020  WBC 3.8 - 10.8 Thousand/uL 18.0(H) 11.2(H) 12.0(H)  Hemoglobin 13.2 - 17.1 g/dL 13.5 12.8(L) 12.5(L)  Hematocrit 38.5 - 50.0 % 39.9 38.5 38.9(L)  Platelets 140 - 400 Thousand/uL 144 161 151   No results found for: VD25OH  Clinical ASCVD: No  The ASCVD Risk score Mikey Bussing DC Jr., et al., 2013) failed to calculate for the following reasons:   The 2013 ASCVD risk score is only valid for ages 108 to 26    Social History   Tobacco Use  Smoking Status Former   Packs/day: 0.25   Years: 20.00   Pack years: 5.00   Types: Cigarettes   Quit date: 09/28/1995   Years since quitting: 25.0  Smokeless Tobacco Never   BP Readings from Last 3 Encounters:  08/07/20 (!) 114/56  05/27/20 (!) 160/90  04/15/20 122/70   Pulse Readings from Last 3 Encounters:  08/07/20 (!) 57  05/27/20 (!) 46  04/15/20 (!) 52   Wt Readings from Last 3 Encounters:  08/07/20 250 lb 12.8 oz (113.8 kg)  05/27/20 255 lb (115.7 kg)  04/15/20 252 lb (114.3 kg)    Assessment: Review of patient past medical history, allergies, medications, health status, including review of consultants reports, laboratory and other test data, was performed as part of comprehensive evaluation and provision of chronic care management services.   SDOH:  (Social Determinants of Health) assessments and interventions performed: Yes  CCM Care Plan No Known Allergies Medications Reviewed Today     Reviewed by Linton Ham, CMA (Certified Medical Assistant) on 08/07/20 at 1509  Med List Status: <None>   Medication Order Taking? Sig  Documenting Provider Last Dose Status Informant  acetaminophen (TYLENOL) 500 MG tablet [id number] No Take 500-1,000 mg by mouth every 6 (six) hours as needed (pain). Reported on 07/15/2015  Patient not taking: Reported on 08/07/2020   [provider] Not Taking Active Self  atorvastatin (LIPITOR) 10 MG tablet [id number] Yes TAKE 1 TABLET BY MOUTH ONCE WEEKLY [name], MD Taking Active   BD PEN NEEDLE NANO 2ND GEN 32G X 4 MM MISC [id number] Yes USE AS DIRECTED [name], MD Taking Active   finasteride (PROSCAR) 5 MG tablet [id number] Yes TAKE 1 TABLET BY MOUTH EVERY DAY [name], MD Taking Active   glipiZIDE (GLUCOTROL XL) 5 MG 24 hr tablet [id number] Yes TAKE 1 TABLET BY MOUTH EVERY DAY [name], MD Taking Active   glucose blood [facility] VERIO) test strip [id number] Yes Use to test blood sugars twice  daily. Dx: E11.9 [name], MD Taking Active   HYDROcodone-acetaminophen (NORCO/VICODIN) 5-325 MG tablet [id number] Yes Take 1-2 tablets by mouth every 6 (six) hours as needed. [provider] Taking Active   insulin degludec (TRESIBA FLEXTOUCH) 100 UNIT/ML FlexTouch Pen [id number] Yes Inject 0.2-0.3 mLs (20-30 Units total) into the skin daily. We will also sign for any needed supplies [name], MD Taking Active   losartan (COZAAR) 100 MG tablet [id number] Yes TAKE 1 TABLET BY MOUTH  DAILY  Patient taking differently: Take 50 mg by mouth daily.   [name], MD Taking Active   Misc Natural Products (GLUCOSAMINE CHOND CMP ADVANCED PO) [id number] Yes Take by mouth in the morning and at bedtime. [provider] Taking Active Self  ONE TOUCH LANCETS [place] [id number] Yes Use to test blood sugars. Dx:E11.9 [name], MD Taking Active Self  tamsulosin (FLOMAX) 0.4 MG CAPS capsule [id number] Yes TAKE 1 CAPSULE BY MOUTH  DAILY [name], MD Taking Active   timolol (TIMOPTIC) 0.5 % ophthalmic solution [id number] Yes 1 drop 2  (two) times daily. [provider] Taking Active   TRAVATAN Z 0.004 % SOLN ophthalmic solution [id number] Yes Place 1 drop into the left eye at bedtime.  [provider] Taking Active Self           Med Note Leonides Schanz, JENNIFER L   Wed Dec 03, 2014  9:34 AM) Received from: External Pharmacy  vitamin B-12 (CYANOCOBALAMIN) 1000 MCG tablet [id number] Yes Take 1,000 mcg by mouth daily. [provider] Taking Active            Patient Active Problem List   Diagnosis Date Noted   Enteritis due to Clostridium difficile    Discitis of lumbar region    History of osteomyelitis of lumbar vertebrae    Hyperlipidemia    CKD (chronic kidney disease), stage III ([place]) 01/13/2015   Lumbar spinal stenosis 06/26/2014   BPH (benign prostatic hyperplasia) 06/24/2014   B12 deficiency  12/12/2008   Diabetic polyneuropathy ([place]) 12/12/2008   ERECTILE DYSFUNCTION 03/18/2008   Chronic lymphocytic leukemia ([place]) 03/08/2007   Diabetes mellitus type II, controlled ([place]) 03/08/2007   Gout 03/08/2007   Glaucoma 03/08/2007   Essential hypertension 03/08/2007   Osteoarthritis 03/08/2007   Immunization History  Administered Date(s) Administered   Fluad Quad(high Dose 65+) 01/02/2019, 02/27/2020   Influenza Split 02/10/2011, 01/11/2012   Influenza Whole 03/15/2007, 01/31/2008, 02/04/2009, 02/01/2010   Influenza, High Dose Seasonal PF 01/18/2016, 01/24/2017, 01/29/2018   Influenza,inj,Quad PF,6+ Mos 02/12/2014, 01/13/2015   Moderna Sars-Covid-2 Vaccination 05/24/2019, 06/10/2019, 02/29/2020   Pneumococcal Conjugate-13 03/23/2015   Pneumococcal Polysaccharide-23 03/24/2009, 02/12/2014   Td 03/09/2007   Tdap 09/19/2017   Conditions to be addressed/monitored: HTN, HLD and DMII  Care Plan : [place]  Updates made by Madelin Rear, [facility] since 10/20/2020 12:00 AM     Problem: HLD DMII HTN      Long-Range Goal: Disease Management   Start Date: 07/06/2020  Expected End Date:  07/06/2021  Recent Progress: On track  Priority: High  Note:   Pharmacist Clinical Goal(s):  Over the next 365 days, patient will verbalize ability to afford treatment regimen through collaboration with PharmD and provider.   Interventions: 1:1 collaboration with [name], MD regarding development and update of comprehensive plan of care as evidenced by provider attestation and co-signature Inter-disciplinary care team collaboration (see longitudinal plan of care) Comprehensive medication review performed; medication list updated in electronic medical record  Hypertension (BP goal <140/90) -Controlled -Current treatment: Losartan 100 mg tablet - taking half tablet once daily  -Medications previously tried: valsartan 80 mg once daily  -Current home readings: none provided -Current exercise habits: limited by back pain, gets occasional steroid shots in addition to taking hydrocodone -Denies hypotensive/hypertensive symptoms  -Recommended to continue current medication Counseled on low salt diet  Hyperlipidemia: (LDL goal < 100) -Controlled -Current treatment: Atorvastatin 10 mg once WEEKLY -Medications previously tried: n/a  -No side effects, tolerates once weekly dose  -Counseled on diet and exercise extensively Recommended to continue current medication  Type II Diabetes (A1c goal <7%) -Controlled  -Last seen by PCP 07/2020 - a1c at goal. Denies any low readings today or within last two weeks. Would drink some juice if he was to run low but has not been an issue past few years. -Current medications:  Glipizide XL 5 mg once daily Tresiba 20-30 units daily into the skin once daily  -Current home glucose readings CGM: 7 day average 118.  -Denies hypoglycemic/hyperglycemic symptoms -Educated on a1c and FBG goals, BG correction/rule of 15 -Previously discussed reducing hypoglycemic agents and switching to GLP1 but will hold off for time being  Patient Goals/Self-Care  Activities Over the next 365 days, patient will:  - take medications as prescribed collaborate with provider on medication access solutions  Medication Assistance: None required.  Patient affirms current coverage meets needs.       Patient's preferred pharmacy is:  [id number] [place] [place]Phone: [phone]: [phone] [facility]  [address] [address]: [phone]: [phone] [id number] [place]: [phone]: [phone] Up:  Patient agrees to Care Plan and Follow-up.  Plan: Telephone follow up appointment with care  management team member scheduled for:  6 months, CPA DM call 3 months   Future Appointments  Date Time Provider [place]  02/05/2021  9:30 AM [facility] None  02/05/2021 10:00 AM Wyatt Portela, MD [facility] None  02/08/2021  2:40 PM Yong Channel Brayton Mars, MD [facility]    Madelin Rear, Pharm.D., BCGP Clinical Pharmacist [place] [phone]

## 2020-10-20 NOTE — Patient Instructions (Signed)
Mr. Mcnab,  Thank you for talking with me today. I have included our care plan/goals in the following pages.   Please review and call me at [phone] with any questions.  Thanks! Ellin Mayhew, Pharm.D., BCGP Clinical Pharmacist [facility] [place] [phone] Patient Care Plan: [place] Plan     Problem Identified: HLD DMII HTN      Long-Range Goal: Disease Management   Start Date: 07/06/2020  Expected End Date: 07/06/2021  Recent Progress: On track  Priority: High  Note:   Pharmacist Clinical Goal(s):  Over the next 365 days, patient will verbalize ability to afford treatment regimen through collaboration with PharmD and provider.   Interventions: 1:1 collaboration with [name], MD regarding development and update of comprehensive plan of care as evidenced by provider attestation and co-signature Inter-disciplinary care team collaboration (see longitudinal plan of care) Comprehensive medication review performed; medication list updated in electronic medical record  Hypertension (BP goal <140/90) -Controlled -Current treatment: Losartan 100 mg tablet - taking half tablet once daily  -Medications previously tried: valsartan 80 mg once daily  -Current home readings: none provided -Current exercise habits: limited by back pain, gets occasional steroid shots in addition to taking hydrocodone -Denies hypotensive/hypertensive symptoms  -Recommended to continue current medication Counseled on low salt diet  Hyperlipidemia: (LDL goal < 100) -Controlled -Current treatment: Atorvastatin 10 mg once WEEKLY -Medications previously tried: n/a  -No side effects, tolerates once weekly dose  -Counseled on diet and exercise extensively Recommended to continue current medication  Type II Diabetes (A1c goal <7%) -Controlled  -Last seen by PCP 07/2020 - a1c at goal. Denies any low readings today or within last two  weeks. Would drink some juice if he was to run low but has not been an issue past few years. -Current medications:  Glipizide XL 5 mg once daily Tresiba 20-30 units daily into the skin once daily  -Current home glucose readings CGM: 7 day average 118.  -Denies hypoglycemic/hyperglycemic symptoms -Educated on a1c and FBG goals, BG correction/rule of 15 -Previously discussed reducing hypoglycemic agents and switching to GLP1 but will hold off for time being  Patient Goals/Self-Care Activities Over the next 365 days, patient will:  - take medications as prescribed collaborate with provider on medication access solutions  Medication Assistance: None required.  Patient affirms current coverage meets needs.    The patient verbalized understanding of instructions provided today and agreed to receive a MyCharty copy of patient instruction and/or educational materials. Telephone follow up appointment with pharmacy team member scheduled for: See next appointment with "Care Management Staff" under "What's Next" below.

## 2020-10-28 DIAGNOSIS — H2513 Age-related nuclear cataract, bilateral: Secondary | ICD-10-CM | POA: Diagnosis not present

## 2020-10-28 DIAGNOSIS — E119 Type 2 diabetes mellitus without complications: Secondary | ICD-10-CM | POA: Diagnosis not present

## 2020-10-28 DIAGNOSIS — H43813 Vitreous degeneration, bilateral: Secondary | ICD-10-CM | POA: Diagnosis not present

## 2020-10-28 DIAGNOSIS — H401132 Primary open-angle glaucoma, bilateral, moderate stage: Secondary | ICD-10-CM | POA: Diagnosis not present

## 2020-10-28 LAB — HM DIABETES EYE EXAM

## 2020-10-29 ENCOUNTER — Encounter: Payer: Self-pay | Admitting: Family Medicine

## 2020-11-12 DIAGNOSIS — I10 Essential (primary) hypertension: Secondary | ICD-10-CM | POA: Diagnosis not present

## 2020-11-12 DIAGNOSIS — M7918 Myalgia, other site: Secondary | ICD-10-CM | POA: Diagnosis not present

## 2020-11-13 ENCOUNTER — Ambulatory Visit (INDEPENDENT_AMBULATORY_CARE_PROVIDER_SITE_OTHER): Payer: Medicare Other

## 2020-11-13 DIAGNOSIS — Z0000 Encounter for general adult medical examination without abnormal findings: Secondary | ICD-10-CM

## 2020-11-13 NOTE — Progress Notes (Signed)
I have reviewed and agree with note, evaluation, plan.   Linken Mcglothen, MD  

## 2020-11-13 NOTE — Patient Instructions (Signed)
Kevin Rogers , Thank you for taking time to come for your Medicare Wellness Visit. I appreciate your ongoing commitment to your health goals. Please review the following plan we discussed and let me know if I can assist you in the future.   Screening recommendations/referrals: Colonoscopy: No longer required  Recommended yearly ophthalmology/optometry visit for glaucoma screening and checkup Recommended yearly dental visit for hygiene and checkup  Vaccinations: Influenza vaccine: Due 11/30/20 Pneumococcal vaccine: Completed  Tdap vaccine: Done 09/19/17 repeat in 10 years 09/20/27 Shingles vaccine: Shingrix discussed. Please contact your pharmacy for coverage information.    Covid-19: Completed 1/22, 2/8, & 02/29/20  Advanced directives: Please bring a copy of your health care power of attorney and living will to the office at your convenience.  Conditions/risks identified: None at this time  Next appointment: Follow up in one year for your annual wellness visit.   Preventive Care 85 Years and Older, Male Preventive care refers to lifestyle choices and visits with your health care provider that can promote health and wellness. What does preventive care include? A yearly physical exam. This is also called an annual well check. Dental exams once or twice a year. Routine eye exams. Ask your health care provider how often you should have your eyes checked. Personal lifestyle choices, including: Daily care of your teeth and gums. Regular physical activity. Eating a healthy diet. Avoiding tobacco and drug use. Limiting alcohol use. Practicing safe sex. Taking low doses of aspirin every day. Taking vitamin and mineral supplements as recommended by your health care provider. What happens during an annual well check? The services and screenings done by your health care provider during your annual well check will depend on your age, overall health, lifestyle risk factors, and family history of  disease. Counseling  Your health care provider may ask you questions about your: Alcohol use. Tobacco use. Drug use. Emotional well-being. Home and relationship well-being. Sexual activity. Eating habits. History of falls. Memory and ability to understand (cognition). Work and work Statistician. Screening  You may have the following tests or measurements: Height, weight, and BMI. Blood pressure. Lipid and cholesterol levels. These may be checked every 5 years, or more frequently if you are over 80 years old. Skin check. Lung cancer screening. You may have this screening every year starting at age 73 if you have a 30-pack-year history of smoking and currently smoke or have quit within the past 15 years. Fecal occult blood test (FOBT) of the stool. You may have this test every year starting at age 25. Flexible sigmoidoscopy or colonoscopy. You may have a sigmoidoscopy every 5 years or a colonoscopy every 10 years starting at age 54. Prostate cancer screening. Recommendations will vary depending on your family history and other risks. Hepatitis C blood test. Hepatitis B blood test. Sexually transmitted disease (STD) testing. Diabetes screening. This is done by checking your blood sugar (glucose) after you have not eaten for a while (fasting). You may have this done every 1-3 years. Abdominal aortic aneurysm (AAA) screening. You may need this if you are a current or former smoker. Osteoporosis. You may be screened starting at age 73 if you are at high risk. Talk with your health care provider about your test results, treatment options, and if necessary, the need for more tests. Vaccines  Your health care provider may recommend certain vaccines, such as: Influenza vaccine. This is recommended every year. Tetanus, diphtheria, and acellular pertussis (Tdap, Td) vaccine. You may need a Td booster every  10 years. Zoster vaccine. You may need this after age 69. Pneumococcal 13-valent  conjugate (PCV13) vaccine. One dose is recommended after age 60. Pneumococcal polysaccharide (PPSV23) vaccine. One dose is recommended after age 32. Talk to your health care provider about which screenings and vaccines you need and how often you need them. This information is not intended to replace advice given to you by your health care provider. Make sure you discuss any questions you have with your health care provider. Document Released: 05/15/2015 Document Revised: 01/06/2016 Document Reviewed: 02/17/2015 Elsevier Interactive Patient Education  2017 [place] Prevention in the Home Falls can cause injuries. They can happen to people of all ages. There are many things you can do to make your home safe and to help prevent falls. What can I do on the outside of my home? Regularly fix the edges of walkways and driveways and fix any cracks. Remove anything that might make you trip as you walk through a door, such as a raised step or threshold. Trim any bushes or trees on the path to your home. Use bright outdoor lighting. Clear any walking paths of anything that might make someone trip, such as rocks or tools. Regularly check to see if handrails are loose or broken. Make sure that both sides of any steps have handrails. Any raised decks and porches should have guardrails on the edges. Have any leaves, snow, or ice cleared regularly. Use sand or salt on walking paths during winter. Clean up any spills in your garage right away. This includes oil or grease spills. What can I do in the bathroom? Use night lights. Install grab bars by the toilet and in the tub and shower. Do not use towel bars as grab bars. Use non-skid mats or decals in the tub or shower. If you need to sit down in the shower, use a plastic, non-slip stool. Keep the floor dry. Clean up any water that spills on the floor as soon as it happens. Remove soap buildup in the tub or shower regularly. Attach bath mats  securely with double-sided non-slip rug tape. Do not have throw rugs and other things on the floor that can make you trip. What can I do in the bedroom? Use night lights. Make sure that you have a light by your bed that is easy to reach. Do not use any sheets or blankets that are too big for your bed. They should not hang down onto the floor. Have a firm chair that has side arms. You can use this for support while you get dressed. Do not have throw rugs and other things on the floor that can make you trip. What can I do in the kitchen? Clean up any spills right away. Avoid walking on wet floors. Keep items that you use a lot in easy-to-reach places. If you need to reach something above you, use a strong step stool that has a grab bar. Keep electrical cords out of the way. Do not use floor polish or wax that makes floors slippery. If you must use wax, use non-skid floor wax. Do not have throw rugs and other things on the floor that can make you trip. What can I do with my stairs? Do not leave any items on the stairs. Make sure that there are handrails on both sides of the stairs and use them. Fix handrails that are broken or loose. Make sure that handrails are as long as the stairways. Check any carpeting to make  sure that it is firmly attached to the stairs. Fix any carpet that is loose or worn. Avoid having throw rugs at the top or bottom of the stairs. If you do have throw rugs, attach them to the floor with carpet tape. Make sure that you have a light switch at the top of the stairs and the bottom of the stairs. If you do not have them, ask someone to add them for you. What else can I do to help prevent falls? Wear shoes that: Do not have high heels. Have rubber bottoms. Are comfortable and fit you well. Are closed at the toe. Do not wear sandals. If you use a stepladder: Make sure that it is fully opened. Do not climb a closed stepladder. Make sure that both sides of the stepladder  are locked into place. Ask someone to hold it for you, if possible. Clearly mark and make sure that you can see: Any grab bars or handrails. First and last steps. Where the edge of each step is. Use tools that help you move around (mobility aids) if they are needed. These include: Canes. Walkers. Scooters. Crutches. Turn on the lights when you go into a dark area. Replace any light bulbs as soon as they burn out. Set up your furniture so you have a clear path. Avoid moving your furniture around. If any of your floors are uneven, fix them. If there are any pets around you, be aware of where they are. Review your medicines with your doctor. Some medicines can make you feel dizzy. This can increase your chance of falling. Ask your doctor what other things that you can do to help prevent falls. This information is not intended to replace advice given to you by your health care provider. Make sure you discuss any questions you have with your health care provider. Document Released: 02/12/2009 Document Revised: 09/24/2015 Document Reviewed: 05/23/2014 Elsevier Interactive Patient Education  2017 [employer].

## 2020-11-13 NOTE — Progress Notes (Signed)
Virtual Visit via Telephone Note  I connected with  Kevin Rogers on 11/13/20 at  2:30 PM EDT by telephone and verified that I am speaking with the correct person using two identifiers.  Medicare Annual Wellness visit completed telephonically due to Covid-19 pandemic.   Persons participating in this call: This Health Coach and this patient.   Location: Patient: Home Provider: Office   I discussed the limitations, risks, security and privacy concerns of performing an evaluation and management service by telephone and the availability of in person appointments. The patient expressed understanding and agreed to proceed.  Unable to perform video visit due to video visit attempted and failed and/or patient does not have video capability.   Some vital signs may be absent or patient reported.   Willette Brace, LPN   Subjective:   Kevin Rogers is a 87 y.o. male who presents for Medicare Annual/Subsequent preventive examination.  Review of Systems     Cardiac Risk Factors include: advanced age (>60men, >57 women);diabetes mellitus;dyslipidemia;hypertension;male gender;obesity (BMI >30kg/m2)     Objective:    There were no vitals filed for this visit. There is no height or weight on file to calculate BMI.  Advanced Directives 11/13/2020 02/07/2020 09/12/2019 07/17/2019 01/16/2019 02/26/2018 11/07/2017  Does Patient Have a Medical Advance Directive? Yes Yes Yes No Yes No Yes  Type of Advance Directive Living will Living will Living will;Healthcare Power of Attorney - Living will;Healthcare Power of Attorney - Living will;Healthcare Power of Attorney  Does patient want to make changes to medical advance directive? - No - Patient declined No - Patient declined - No - Patient declined - No - Patient declined  Copy of Healthcare Power of Attorney in Chart? - - No - copy requested - No - copy requested - No - copy requested  Would patient like information on creating a medical advance directive? -  No - Patient declined - - - No - Patient declined -    Current Medications (verified) Outpatient Encounter Medications as of 11/13/2020  Medication Sig   atorvastatin (LIPITOR) 10 MG tablet TAKE 1 TABLET BY MOUTH ONCE WEEKLY   BD PEN NEEDLE NANO 2ND GEN 32G X 4 MM MISC USE AS DIRECTED   finasteride (PROSCAR) 5 MG tablet TAKE 1 TABLET BY MOUTH EVERY DAY   glipiZIDE (GLUCOTROL XL) 5 MG 24 hr tablet TAKE 1 TABLET BY MOUTH EVERY DAY   glucose blood (ONETOUCH VERIO) test strip Use to test blood sugars twice  daily. Dx: E11.9   HYDROcodone-acetaminophen (NORCO/VICODIN) 5-325 MG tablet Take 1-2 tablets by mouth every 6 (six) hours as needed.   insulin degludec (TRESIBA FLEXTOUCH) 100 UNIT/ML FlexTouch Pen Inject 0.2-0.3 mLs (20-30 Units total) into the skin daily. We will also sign for any needed supplies (Patient taking differently: Inject 16 Units into the skin. Inject 0.2-0.3 mLs (20-30 Units total) into the skin daily. We will also sign for any needed supplies)   losartan (COZAAR) 100 MG tablet TAKE 1 TABLET BY MOUTH  DAILY (Patient taking differently: Take 50 mg by mouth daily.)   Misc Natural Products (GLUCOSAMINE CHOND CMP ADVANCED PO) Take by mouth in the morning and at bedtime.   ONE TOUCH LANCETS MISC Use to test blood sugars. Dx:E11.9   tamsulosin (FLOMAX) 0.4 MG CAPS capsule TAKE 1 CAPSULE BY MOUTH  DAILY   timolol (TIMOPTIC) 0.5 % ophthalmic solution 1 drop 2 (two) times daily.   TRAVATAN Z 0.004 % SOLN ophthalmic solution Place 1 drop into the left  eye at bedtime.    vitamin B-12 (CYANOCOBALAMIN) 1000 MCG tablet Take 1,000 mcg by mouth daily.   MODERNA COVID-19 VACCINE 100 MCG/0.5ML injection    [DISCONTINUED] acetaminophen (TYLENOL) 500 MG tablet Take 500-1,000 mg by mouth every 6 (six) hours as needed (pain). Reported on 07/15/2015 (Patient not taking: No sig reported)   No facility-administered encounter medications on file as of 11/13/2020.    Allergies (verified) Patient has no  known allergies.   History: Past Medical History:  Diagnosis Date   Acute bronchitis 09/12/2007   Qualifier: Diagnosis of  By: [name] MD, [name]    Diabetes mellitus type 2 with complications ([place]) 6812   Glaucoma    left eye   Gout    Hyperlipidemia    Hypertension    Leukemia, chronic ([place]) 2001   does not take treatment just has blood levels checked once a year   Past Surgical History:  Procedure Laterality Date   ANTERIOR LAT LUMBAR FUSION Left 01/03/2013   Procedure: ANTERIOR LATERAL LUMBAR FUSION lumbar three-four;  Surgeon: [name], MD;  Location: [facility];  Service: Neurosurgery;  Laterality: Left;   BACK SURGERY     lumbar fusion - 2016, feb   COLONOSCOPY W/ POLYPECTOMY     DENTAL SURGERY     implanted teeth x 2   [place] 1 LEVEL  01/03/2013   Procedure: LUMBAR PERCUTANEOUS PEDICLE SCREWS LUMBAR THREE-FOUR;  Surgeon: [name], MD;  Location: [facility];  Service: Neurosurgery;;   LUMBAR WOUND DEBRIDEMENT N/A 07/23/2014   Procedure: LUMBAR WOUND DEBRIDEMENT;  Surgeon: [name], MD;  Location: [facility];  Service: Neurosurgery;  Laterality: N/A;   TONSILLECTOMY     Family History  Problem Relation Age of Onset   Heart disease Mother        32, second hand   Heart disease Father        73, former smoker   Social History   Socioeconomic History   Marital status: Married    Spouse name: Not on file   Number of children: Not on file   Years of education: Not on file   Highest education level: Associate degree: occupational, Hotel manager, or vocational program  Occupational History   Occupation: Retired     Comment: -Land   Tobacco Use   Smoking status: Former    Packs/day: 0.25    Years: 20.00    Pack years: 5.00    Types: Cigarettes    Quit date: 09/28/1995    Years since quitting: 25.1   Smokeless tobacco: Never  Vaping Use   Vaping Use: Never  used  Substance and Sexual Activity   Alcohol use: Not Currently    Alcohol/week: 1.0 standard drink    Types: 1 Glasses of wine per week    Comment: rarely   Drug use: No   Sexual activity: Yes  Other Topics Concern   Not on file  Social History Narrative   Remarried. 2017. Widowed 2009. 2 sons. 2 grandkids-boy/girl. No greatgrandkids.     Did everything for himself in past but back limiting him now      Retired from [employer]: golf stopped(hard with back), watch tv (pain with moving around)   Social Determinants of Health   Financial Resource Strain: Low Risk    Difficulty of Paying Living Expenses: Not  hard at all  Food Insecurity: No Food Insecurity   Worried About Charity fundraiser in the Last Year: Never true   Ran Out of Food in the Last Year: Never true  Transportation Needs: No Transportation Needs   Lack of Transportation (Medical): No   Lack of Transportation (Non-Medical): No  Physical Activity: Inactive   Days of Exercise per Week: 0 days   Minutes of Exercise per Session: 0 min  Stress: No Stress Concern Present   Feeling of Stress : Not at all  Social Connections: Moderately Integrated   Frequency of Communication with Friends and Family: Three times a week   Frequency of Social Gatherings with Friends and Family: [place] than three times a week   Attends Religious Services: Rogers than 4 times per year   Active Member of [employer] or Organizations: No   Attends Music therapist: Never   Marital Status: Married    Tobacco Counseling Counseling given: Not Answered   Clinical Intake:  Pre-visit preparation completed: Yes  Pain : No/denies pain     BMI - recorded: 34.01 Nutritional Status: BMI > 30  Obese Nutritional Risks: None Diabetes: Yes CBG done?: Yes (120) CBG resulted in Enter/ Edit results?: No Did pt. bring in CBG monitor from home?: No  How often do you need to have someone help you when you read instructions,  pamphlets, or other written materials from your doctor or pharmacy?: 1 - Never  Diabetic?Nutrition Risk Assessment:  Has the patient had any N/V/D within the last 2 months?  No  Does the patient have any non-healing wounds?  No  Has the patient had any unintentional weight loss or weight gain?  No   Diabetes:  Is the patient diabetic?  Yes  If diabetic, was a CBG obtained today?  Yes  Did the patient bring in their glucometer from home?  No  How often do you monitor your CBG's? Daily.   Financial Strains and Diabetes Management:  Are you having any financial strains with the device, your supplies or your medication? No .  Does the patient want to be seen by Chronic Care Management for management of their diabetes?  No  Would the patient like to be referred to a Nutritionist or for Diabetic Management?  No   Diabetic Exams:  Diabetic Eye Exam: Completed 10/28/20 Diabetic Foot Exam: Completed 03/16/20   Interpreter Needed?: No  Information entered by :: [name], LPN   Activities of Daily Living In your present state of health, do you have any difficulty performing the following activities: 11/13/2020  Hearing? Y  Comment mild loss  Vision? N  Difficulty concentrating or making decisions? N  Walking or climbing stairs? Y  Dressing or bathing? N  Doing errands, shopping? N  Preparing Food and eating ? N  Using the Toilet? N  In the past six months, have you accidently leaked urine? N  Do you have problems with loss of bowel control? N  Managing your Medications? N  Managing your Finances? N  Housekeeping or managing your Housekeeping? N  Some recent data might be hidden    Patient Care Team: [name], MD as PCP - General (Family Medicine) [name], MD as Consulting Physician (Neurosurgery) Ralene Bathe, MD as Consulting Physician (Ophthalmology) Alen Blew, Mathis Dad, MD as Consulting Physician (Oncology) Marlaine Hind, MD as Consulting  Physician (Physical Medicine and Rehabilitation) Trula Slade, DPM as Consulting Physician (Podiatry) Madelin Rear, [facility] as Pharmacist (Pharmacist)  Indicate any recent Medical Services you may have received from other than [facility] providers in the past year (date may be approximate).     Assessment:   This is a routine wellness examination for [place].  Hearing/Vision screen Hearing Screening - Comments:: Pt stated mild loss  Vision Screening - Comments:: Pt follow up with Dr Satira Sark for annual eye exams   Dietary issues and exercise activities discussed: Current Exercise Habits: The patient does not participate in regular exercise at present   Goals Addressed             This Visit's Progress    Patient Stated       None at this time       Depression Screen PHQ 2/9 Scores 11/13/2020 08/07/2020 07/22/2019 01/16/2019 11/07/2017 10/18/2016 07/20/2016  PHQ - 2 Score 0 0 0 0 0 0 0  PHQ- 9 Score - 0 0 - 0 - -    Fall Risk Fall Risk  11/13/2020 08/07/2020 09/12/2019 09/12/2019 03/14/2019  Falls in the past year? 1 0 0 0 0  Number falls in past yr: 1 0 0 0 0  Injury with Fall? 1 0 0 - 0  Comment skinned left knee - - - -  Risk for fall due to : Impaired vision;Impaired balance/gait;Impaired mobility - Impaired balance/gait;Impaired mobility - Impaired balance/gait  Risk for fall due to: Comment - - - - -  Follow up Falls prevention discussed - Education provided;Falls evaluation completed;Falls prevention discussed Falls evaluation completed -    FALL RISK PREVENTION PERTAINING TO THE HOME:  Any stairs in or around the home? Yes  If so, are there any without handrails? Yes  Home free of loose throw rugs in walkways, pet beds, electrical cords, etc? Yes  Adequate lighting in your home to reduce risk of falls? No   ASSISTIVE DEVICES UTILIZED TO PREVENT FALLS:  Life alert? No  Use of a cane, walker or w/c? Yes  Grab bars in the bathroom? Yes  Shower chair or bench in shower? Yes   Elevated toilet seat or a handicapped toilet? Yes   TIMED UP AND GO:  Was the test performed? No .     Cognitive Function:     6CIT Screen 11/13/2020 09/12/2019 01/16/2019  What Year? 4 points 0 points 0 points  What month? 0 points 0 points 0 points  What time? 0 points 0 points 0 points  Count back from 20 0 points 0 points 0 points  Months in reverse 0 points 0 points 0 points  Repeat phrase 0 points 0 points 0 points  Total Score 4 0 0    Immunizations Immunization History  Administered Date(s) Administered   Fluad Quad(high Dose 65+) 01/02/2019, 02/27/2020   Influenza Split 02/10/2011, 01/11/2012   Influenza Whole 03/15/2007, 01/31/2008, 02/04/2009, 02/01/2010   Influenza, High Dose Seasonal PF 01/18/2016, 01/24/2017, 01/29/2018   Influenza,inj,Quad PF,6+ Mos 02/12/2014, 01/13/2015   Moderna Sars-Covid-2 Vaccination 05/24/2019, 06/10/2019, 02/29/2020, 08/06/2020   Pneumococcal Conjugate-13 03/23/2015   Pneumococcal Polysaccharide-23 03/24/2009, 02/12/2014   Td 03/09/2007   Tdap 09/19/2017    TDAP status: Up to date  Flu Vaccine status: Up to date  Pneumococcal vaccine status: Up to date  Covid-19 vaccine status: Completed vaccines  Qualifies for Shingles Vaccine? Yes   Zostavax completed No   Shingrix Completed?: No.    Education has been provided regarding the importance of this vaccine. Patient has been advised to call insurance company to determine out of pocket expense if  they have not yet received this vaccine. Advised may also receive vaccine at local pharmacy or Health Dept. Verbalized acceptance and understanding.  Screening Tests Health Maintenance  Topic Date Due   INFLUENZA VACCINE  11/30/2020   COVID-19 Vaccine (5 - Booster) 12/06/2020   HEMOGLOBIN A1C  02/06/2021   FOOT EXAM  03/16/2021   OPHTHALMOLOGY EXAM  10/28/2021   TETANUS/TDAP  09/20/2027   PNA vac Low Risk Adult  Completed   HPV VACCINES  Aged Out   Zoster Vaccines- Shingrix   Discontinued    Health Maintenance  There are no preventive care reminders to display for this patient.   Colorectal cancer screening: No longer required.   Additional Screening:  Hepatitis C Screening: does not qualify  Vision Screening: Recommended annual ophthalmology exams for early detection of glaucoma and other disorders of the eye. Is the patient up to date with their annual eye exam?  Yes  Who is the provider or what is the name of the office in which the patient attends annual eye exams? Dr Satira Sark  If pt is not established with a provider, would they like to be referred to a provider to establish care? No .   Dental Screening: Recommended annual dental exams for proper oral hygiene  Community Resource Referral / Chronic Care Management: CRR required this visit?  No   CCM required this visit?  No      Plan:     I have personally reviewed and noted the following in the patient's chart:   Medical and social history Use of alcohol, tobacco or illicit drugs  Current medications and supplements including opioid prescriptions. Patient is currently taking opioid prescriptions. Information provided to patient regarding non-opioid alternatives. Patient advised to discuss non-opioid treatment plan with their provider. Functional ability and status Nutritional status Physical activity Advanced directives List of other physicians Hospitalizations, surgeries, and ER visits in previous 12 months Vitals Screenings to include cognitive, depression, and falls Referrals and appointments  In addition, I have reviewed and discussed with patient certain preventive protocols, quality metrics, and best practice recommendations. A written personalized care plan for preventive services as well as general preventive health recommendations were provided to patient.     Willette Brace, LPN   [id number]   Nurse Notes: None

## 2020-11-26 ENCOUNTER — Other Ambulatory Visit: Payer: Self-pay | Admitting: Family Medicine

## 2020-11-28 ENCOUNTER — Other Ambulatory Visit: Payer: Self-pay | Admitting: Family Medicine

## 2021-01-12 ENCOUNTER — Telehealth: Payer: Self-pay | Admitting: Pharmacist

## 2021-01-12 NOTE — Chronic Care Management (AMB) (Addendum)
Chronic Care Management Pharmacy Assistant   Name: Kevin Rogers  MRN: [id number] DOB: 01-26-1932   Reason for Encounter: Disease State call for DM   Recent office visits:  7.15.22 Kevin Rakes LPN. Annual Wellness. No Med changes. No follow ups on file.  Recent consult visits:  None since 10/20/20  Hospital visits:  None since 10/20/20  Medications: Outpatient Encounter Medications as of 01/12/2021  Medication Sig Note   atorvastatin (LIPITOR) 10 MG tablet TAKE 1 TABLET BY MOUTH ONCE WEEKLY    BD PEN NEEDLE NANO 2ND GEN 32G X 4 MM MISC USE AS DIRECTED    finasteride (PROSCAR) 5 MG tablet TAKE 1 TABLET BY MOUTH EVERY DAY    glipiZIDE (GLUCOTROL XL) 5 MG 24 hr tablet TAKE 1 TABLET BY MOUTH EVERY DAY    glucose blood (ONETOUCH VERIO) test strip USE TO TEST BLOOD SUGARS DAILY. DX: E11.9    HYDROcodone-acetaminophen (NORCO/VICODIN) 5-325 MG tablet Take 1-2 tablets by mouth every 6 (six) hours as needed.    insulin degludec (TRESIBA FLEXTOUCH) 100 UNIT/ML FlexTouch Pen Inject 0.2-0.3 mLs (20-30 Units total) into the skin daily. We will also sign for any needed supplies (Patient taking differently: Inject 16 Units into the skin. Inject 0.2-0.3 mLs (20-30 Units total) into the skin daily. We will also sign for any needed supplies)    losartan (COZAAR) 100 MG tablet TAKE 1 TABLET BY MOUTH  DAILY (Patient taking differently: Take 50 mg by mouth daily.)    Misc Natural Products (GLUCOSAMINE CHOND CMP ADVANCED PO) Take by mouth in the morning and at bedtime.    MODERNA COVID-19 VACCINE 100 MCG/0.5ML injection     ONE TOUCH LANCETS MISC Use to test blood sugars. Dx:E11.9    tamsulosin (FLOMAX) 0.4 MG CAPS capsule TAKE 1 CAPSULE BY MOUTH  DAILY    timolol (TIMOPTIC) 0.5 % ophthalmic solution 1 drop 2 (two) times daily.    TRAVATAN Z 0.004 % SOLN ophthalmic solution Place 1 drop into the left eye at bedtime.  12/03/2014: Received from: External Pharmacy   vitamin B-12 (CYANOCOBALAMIN) 1000 MCG  tablet Take 1,000 mcg by mouth daily.    No facility-administered encounter medications on file as of 01/12/2021.   Recent Relevant Labs: Lab Results  Component Value Date/Time   HGBA1C 6.6 (H) 08/07/2020 04:01 PM   HGBA1C 6.1 (H) 03/16/2020 04:30 PM   MICROALBUR 1.3 07/08/2015 08:18 AM   MICROALBUR 1.2 07/23/2012 08:17 AM    Kidney Function Lab Results  Component Value Date/Time   CREATININE 1.54 (H) 08/07/2020 04:01 PM   CREATININE 1.43 (H) 03/16/2020 04:30 PM   CREATININE 1.3 06/09/2016 09:34 AM   CREATININE 1.5 (H) 12/08/2015 08:29 AM   GFR 51.74 (L) 11/11/2019 08:26 AM   GFRNONAA 40 (L) 08/07/2020 04:01 PM   GFRAA 47 (L) 08/07/2020 04:01 PM    Current antihyperglycemic regimen:  Glipizide XL 5 mg once daily Tresiba 20-30 units daily into the skin once daily Taking 16 units after breakfast   What recent interventions/DTPs have been made to improve glycemic control:  No recent changes   Have there been any recent hospitalizations or ED visits since last visit with CPP? No  Patient denies hypoglycemic symptoms, including None  Patient denies hyperglycemic symptoms, including none  How often are you checking your blood sugar? once daily before breakfast ranging in the 120s  What are your blood sugars ranging?  Fasting: 120 01/12/21  During the week, how often does your blood glucose drop  below 70? Never  Are you checking your feet daily/regularly?  Checking every morning when he gets out of bed   Adherence Review: Is the patient currently on a STATIN medication? Yes Is the patient currently on ACE/ARB medication? Yes Does the patient have >5 day gap between last estimated fill dates? No  Care Gaps: Medicare Annual Wellness: Done on 11/13/20 Ophthalmology Exam:Next due on 10/28/21 Foot Exam: Next due on 03/16/21 Hemoglobin A1C: 08/07/20 (6.6) Colonoscopy: Done on 04/09/2007 Dexa Scan:N/A   Star Rating Drugs: Glipizide 5 mg  last filled on 11/19/20 [facility] Losartan  100 mg last filled on 06/25/20 [facility] Pt states he has an over supply. Takes 1/2 tablet daily  Atorvastatin 10 mg last filled on 08/03/20 [facility]  Only taking once a week   Future Appointments  Date Time Provider [place]  02/05/2021  9:30 AM [facility] None  02/05/2021 10:00 AM Wyatt Portela, MD [facility] None  02/08/2021  2:40 PM [name], MD [facility]  04/05/2021  9:00 AM [facility] CCM PHARMACIST [facility]  11/18/2021  3:15 PM [facility] HEALTH COACH [facility]      [place]   10 minutes spent in review, coordination, and documentation.  Reviewed by: Beverly Milch, PharmD Clinical Pharmacist [phone]

## 2021-01-18 DIAGNOSIS — M7918 Myalgia, other site: Secondary | ICD-10-CM | POA: Diagnosis not present

## 2021-01-18 DIAGNOSIS — I10 Essential (primary) hypertension: Secondary | ICD-10-CM | POA: Diagnosis not present

## 2021-01-18 DIAGNOSIS — M48061 Spinal stenosis, lumbar region without neurogenic claudication: Secondary | ICD-10-CM | POA: Diagnosis not present

## 2021-01-18 DIAGNOSIS — M47814 Spondylosis without myelopathy or radiculopathy, thoracic region: Secondary | ICD-10-CM | POA: Diagnosis not present

## 2021-01-18 DIAGNOSIS — M5416 Radiculopathy, lumbar region: Secondary | ICD-10-CM | POA: Diagnosis not present

## 2021-01-18 DIAGNOSIS — M961 Postlaminectomy syndrome, not elsewhere classified: Secondary | ICD-10-CM | POA: Diagnosis not present

## 2021-01-18 DIAGNOSIS — Z79899 Other long term (current) drug therapy: Secondary | ICD-10-CM | POA: Diagnosis not present

## 2021-02-01 NOTE — Progress Notes (Signed)
Phone: [phone]   Subjective:  Patient presents today for their annual physical. Chief complaint-noted.   See problem oriented charting- ROS- full  review of systems was completed and negative  except for: runny nose/post nasal drips, eye drainage at times, joint pain, back pain   The following were reviewed and entered/updated in epic: Past Medical History:  Diagnosis Date   Acute bronchitis 09/12/2007   Qualifier: Diagnosis of  By: [name] MD, [name]    Diabetes mellitus type 2 with complications ([place]) 5993   Glaucoma    left eye   Gout    Hyperlipidemia    Hypertension    Leukemia, chronic ([place]) 2001   does not take treatment just has blood levels checked once a year   Patient Active Problem List   Diagnosis Date Noted   Chronic lymphocytic leukemia ([place]) 03/08/2007    Priority: 1.   Diabetes mellitus type II, controlled ([place]) 03/08/2007    Priority: 1.   History of osteomyelitis of lumbar vertebrae     Priority: 2.   Hyperlipidemia     Priority: 2.   CKD (chronic kidney disease), stage III ([place]) 01/13/2015    Priority: 2.   Lumbar spinal stenosis 06/26/2014    Priority: 2.   BPH (benign prostatic hyperplasia) 06/24/2014    Priority: 2.   Gout 03/08/2007    Priority: 2.   Essential hypertension 03/08/2007    Priority: 2.   Enteritis due to Clostridium difficile     Priority: 3.   Discitis of lumbar region     Priority: 3.   B12 deficiency 12/12/2008    Priority: 3.   Diabetic polyneuropathy ([place]) 12/12/2008    Priority: 3.   ERECTILE DYSFUNCTION 03/18/2008    Priority: 3.   Glaucoma 03/08/2007    Priority: 3.   Osteoarthritis 03/08/2007    Priority: 3.   Past Surgical History:  Procedure Laterality Date   ANTERIOR LAT LUMBAR FUSION Left 01/03/2013   Procedure: ANTERIOR LATERAL LUMBAR FUSION lumbar three-four;  Surgeon: [name], MD;  Location: [facility];  Service: Neurosurgery;  Laterality: Left;   BACK SURGERY     lumbar fusion - 2016,  feb   COLONOSCOPY W/ POLYPECTOMY     DENTAL SURGERY     implanted teeth x 2   [place] 1 LEVEL  01/03/2013   Procedure: LUMBAR PERCUTANEOUS PEDICLE SCREWS LUMBAR THREE-FOUR;  Surgeon: [name], MD;  Location: [facility];  Service: Neurosurgery;;   LUMBAR WOUND DEBRIDEMENT N/A 07/23/2014   Procedure: LUMBAR WOUND DEBRIDEMENT;  Surgeon: [name], MD;  Location: [facility];  Service: Neurosurgery;  Laterality: N/A;   TONSILLECTOMY      Family History  Problem Relation Age of Onset   Heart disease Mother        20, second hand   Heart disease Father        5, former smoker    Medications- reviewed and updated Current Outpatient Medications  Medication Sig Dispense Refill   atorvastatin (LIPITOR) 10 MG tablet TAKE 1 TABLET BY MOUTH ONCE WEEKLY 13 tablet 3   BD PEN NEEDLE NANO 2ND GEN 32G X 4 MM MISC USE AS DIRECTED 100 each 1   finasteride (PROSCAR) 5 MG tablet TAKE 1 TABLET BY MOUTH EVERY DAY 90 tablet 3   glipiZIDE (GLUCOTROL XL) 5 MG 24 hr tablet TAKE 1 TABLET BY MOUTH  EVERY DAY 90 tablet 1   glucose blood (ONETOUCH VERIO) test strip USE TO TEST BLOOD SUGARS DAILY. DX: E11.9 100 strip 12   HYDROcodone-acetaminophen (NORCO/VICODIN) 5-325 MG tablet Take 1-2 tablets by mouth every 6 (six) hours as needed.     insulin degludec (TRESIBA FLEXTOUCH) 100 UNIT/ML FlexTouch Pen Inject 0.2-0.3 mLs (20-30 Units total) into the skin daily. We will also sign for any needed supplies (Patient taking differently: Inject 16 Units into the skin. Inject 0.2-0.3 mLs (20-30 Units total) into the skin daily. We will also sign for any needed supplies) 27 mL 3   losartan (COZAAR) 100 MG tablet TAKE 1 TABLET BY MOUTH  DAILY (Patient taking differently: Take 50 mg by mouth daily.) 90 tablet 3   Misc Natural Products (GLUCOSAMINE CHOND CMP ADVANCED PO) Take by mouth in the morning and at bedtime.     ONE TOUCH LANCETS MISC Use  to test blood sugars. Dx:E11.9 100 each 11   tamsulosin (FLOMAX) 0.4 MG CAPS capsule TAKE 1 CAPSULE BY MOUTH  DAILY 90 capsule 3   timolol (TIMOPTIC) 0.5 % ophthalmic solution 1 drop 2 (two) times daily.     TRAVATAN Z 0.004 % SOLN ophthalmic solution Place 1 drop into the left eye at bedtime.      vitamin B-12 (CYANOCOBALAMIN) 1000 MCG tablet Take 1,000 mcg by mouth daily.     MODERNA COVID-19 VACCINE 100 MCG/0.5ML injection      No current facility-administered medications for this visit.    Allergies-reviewed and updated No Known Allergies  Social History   Social History Narrative   Remarried. 2017. Widowed 2009. 2 sons. 2 grandkids-boy/girl. No greatgrandkids.     Did everything for himself in past but back limiting him now      Retired from [employer]: golf stopped(hard with back), watch tv (pain with moving around)   Objective  Objective:  BP 118/74   Pulse (!) 58   Temp 98 F (36.7 C) (Temporal)   Ht 6' (1.829 m)   Wt 251 lb 12.8 oz (114.2 kg)   SpO2 97%   BMI 34.15 kg/m  Gen: NAD, resting comfortably HEENT: Mucous membranes are moist. Oropharynx normal Neck: no thyromegaly CV: RRR no murmurs rubs or gallops Lungs: CTAB no crackles, wheeze, rhonchi Abdomen: soft/nontender/nondistended/normal bowel sounds. No rebound or guarding.  Ext: 1+ edema under compression stockings Skin: warm, dry Neuro: grossly normal, moves all extremities, PERRLA, walks with rollator walker- examined him in chair   Assessment and Plan  87 y.o. male presenting for annual physical.  Health Maintenance counseling: 1. Anticipatory guidance: Patient counseled regarding regular dental exams -q6 months, eye exams - for gluacoma every 6 months,  avoiding smoking and second hand smoke , limiting alcohol to 2 beverages per day - doesn't drink.  No illicit drugs  2. Risk factor reduction:  Advised patient of need for regular exercise and diet rich and fruits and vegetables to reduce  risk of heart attack and stroke. Exercise- limited by backstill. Diet-weight pretty stable lately. .  Wt Readings from Last 3 Encounters:  02/08/21 251 lb 12.8 oz (114.2 kg)  02/05/21 250 lb 3.2 oz (113.5 kg)  08/07/20 250 lb 12.8 oz (113.8 kg)  3. Immunizations/screenings/ancillary studies DISCUSSED:  -Flu vaccination (last one 10/21) - high dose flu shot today -COVID vaccination bivalent was just done- team will get date and log this Immunization History  Administered Date(s) Administered   Fluad Quad(high Dose  65+) 01/02/2019, 02/27/2020   Influenza Split 02/10/2011, 01/11/2012   Influenza Whole 03/15/2007, 01/31/2008, 02/04/2009, 02/01/2010   Influenza, High Dose Seasonal PF 01/18/2016, 01/24/2017, 01/29/2018   Influenza,inj,Quad PF,6+ Mos 02/12/2014, 01/13/2015   Moderna Sars-Covid-2 Vaccination 05/24/2019, 06/10/2019, 02/29/2020, 08/06/2020   Pneumococcal Conjugate-13 03/23/2015   Pneumococcal Polysaccharide-23 03/24/2009, 02/12/2014   Td 03/09/2007   Tdap 09/19/2017  4. Prostate cancer screening-passed age based screening recommendations  - overall trend was low risk for prostate cancer Lab Results  Component Value Date   PSA 0.51 07/08/2015   PSA 0.66 08/05/2013   PSA 0.80 07/23/2012   5. Colon cancer screening - - passed age based screening recommendations. No blood in stool . Had wanted to do stool cards until 54 but now passed that age 63. Skin cancer screening- lower risk due to melanin content. advised regular sunscreen use. Denies worrisome, changing, or new skin lesions.  7. Smoking associated screening (lung cancer screening, AAA screen 65-75, UA)- Former smoker- quit in 1990s- no regular screening other than wants ua 8. STD screening - not indicated only active with wife   Status of chronic or acute concerns   #Bradycardia-when patient was that Dr. Hazeline Junker office last week-heart rate was as low as 36.  We discussed not certain this was an accurate measure-up to 58  today.  If this was an accurate measure could be related to timolol eyedrops.  He was not having any symptoms at the time such as dizziness, chest pain, shortness of breath.  We opted to monitor only for now and if recurrent issues could look at making adjustments through ophthalmology or getting a cardiology consult  #hypertension/CKD stage III S: medication: Losartan 100 mg daily Home readings #s: well controlled BP Readings from Last 3 Encounters:  02/08/21 118/74  02/05/21 (!) 131/58  08/07/20 (!) 114/56  A/P: Controlled. Continue current medications.     # Diabetes S: Medication: currently on tresiba 16 units.  -we considered stopping glipizide 5 mg daily and starting ozempic per suggestion by Pharmacist Madelin Rear- in end opted out for now- will keep in our back pocket CBGs- 90 day morning average of 120 on his monitor- does not check other times of day Exercise and diet- see above Lab Results  Component Value Date   HGBA1C 6.6 (H) 08/07/2020   HGBA1C 6.1 (H) 03/16/2020   HGBA1C 6.1 (A) 11/11/2019   A/P: hopefully stable- update a1c today. Continue current meds for now - does not want to change meds at this time even for CV benefit --had steroid injection 2 weeks ago- sugars could be slightly higher   #hyperlipidemia S: Medication:Atorvastatin 10mg  once a week. Past primary prevention age range-we tried to target LDL at least around 100 Lab Results  Component Value Date   CHOL 155 03/16/2020   HDL 47 03/16/2020   LDLCALC 85 03/16/2020   LDLDIRECT 76.0 11/23/2017   TRIG 131 03/16/2020   CHOLHDL 3.3 03/16/2020   A/P: at his age unlikely to increase rx- continue current meds  #CLL-continued yearly visits with oncology. Diagnosed in 2000. Saw Dr. Alen Blew recently   #BPH S: Patient continued to see benefit with finasteride and Flomax A/P: overall stable- continue current meds    #Lumbar spinal stenosis S: Limits mobility-chronic back pain. taking hydrocodone as needed -  stable- through Dr Brien Few. Some leg weakness. Feels like the way he holds himself with his back ends up stressing other body parts such as shoulders and arms A/P: ongoing chronic issues- continue follow  up with Dr. Brien Few -unfortunately had to increase hydrocodone some.    # B12 deficiency S: Current treatment/medication (oral vs. IM): Oral 1000 mcg Lab Results  Component Value Date   VITAMINB12 1,639 (H) 08/07/2020  A/P: well controlled last visit- update annually   Recommended follow up: No follow-ups on file. Future Appointments  Date Time Provider [place]  04/05/2021  9:00 AM [facility] CCM PHARMACIST [facility]  11/18/2021  3:15 PM [facility] HEALTH COACH [facility]   Lab/Order associations: NOT fasting   ICD-10-CM   1. Preventative health care  Z00.00     2. Hyperlipidemia, unspecified hyperlipidemia type  E78.5 CBC with Differential/Platelet    Comprehensive metabolic panel    Lipid panel    3. Essential hypertension  I10     4. Stage 3 chronic kidney disease, unspecified whether stage 3a or 3b CKD (H[facility])  N18.30     5. Controlled type 2 diabetes mellitus with stage 2 chronic kidney disease, without long-term current use of insulin ([facility])  E11.22 CBC with Differential/Platelet   N18.2 Comprehensive metabolic panel    Lipid panel    Hemoglobin A1c    6. Benign prostatic hyperplasia with nocturia  N40.1 POCT Urinalysis Dipstick (Automated)   R35.1     7. Spinal stenosis of lumbar region, unspecified whether neurogenic claudication present  M48.061     8. B12 deficiency  E53.8       No orders of the defined types were placed in this encounter.  I,Jada Bradford,acting as a scribe for Garret Reddish, MD.,have documented all relevant documentation on the behalf of Garret Reddish, MD,as directed by  Garret Reddish, MD while in the presence of Garret Reddish, MD.  I, Garret Reddish, MD, have reviewed all documentation for this visit. The documentation on 02/08/21  for the exam, diagnosis, procedures, and orders are all accurate and complete.   Return precautions advised.  Garret Reddish, MD

## 2021-02-05 ENCOUNTER — Other Ambulatory Visit: Payer: Self-pay | Admitting: Family Medicine

## 2021-02-05 ENCOUNTER — Inpatient Hospital Stay: Payer: Medicare Other | Attending: Oncology

## 2021-02-05 ENCOUNTER — Other Ambulatory Visit: Payer: Self-pay

## 2021-02-05 ENCOUNTER — Inpatient Hospital Stay: Payer: Medicare Other | Admitting: Oncology

## 2021-02-05 VITALS — BP 131/58 | HR 36 | Temp 99.2°F | Resp 18 | Wt 250.2 lb

## 2021-02-05 DIAGNOSIS — D696 Thrombocytopenia, unspecified: Secondary | ICD-10-CM | POA: Insufficient documentation

## 2021-02-05 DIAGNOSIS — C9110 Chronic lymphocytic leukemia of B-cell type not having achieved remission: Secondary | ICD-10-CM | POA: Diagnosis not present

## 2021-02-05 LAB — CBC WITH DIFFERENTIAL (CANCER CENTER ONLY)
Abs Immature Granulocytes: 0.01 10*3/uL (ref 0.00–0.07)
Basophils Absolute: 0 10*3/uL (ref 0.0–0.1)
Basophils Relative: 0 %
Eosinophils Absolute: 0.1 10*3/uL (ref 0.0–0.5)
Eosinophils Relative: 1 %
HCT: 39.1 % (ref 39.0–52.0)
Hemoglobin: 12.8 g/dL — ABNORMAL LOW (ref 13.0–17.0)
Immature Granulocytes: 0 %
Lymphocytes Relative: 71 %
Lymphs Abs: 9.8 10*3/uL — ABNORMAL HIGH (ref 0.7–4.0)
MCH: 29 pg (ref 26.0–34.0)
MCHC: 32.7 g/dL (ref 30.0–36.0)
MCV: 88.5 fL (ref 80.0–100.0)
Monocytes Absolute: 0.3 10*3/uL (ref 0.1–1.0)
Monocytes Relative: 2 %
Neutro Abs: 3.5 10*3/uL (ref 1.7–7.7)
Neutrophils Relative %: 26 %
Platelet Count: 142 10*3/uL — ABNORMAL LOW (ref 150–400)
RBC: 4.42 MIL/uL (ref 4.22–5.81)
RDW: 13.7 % (ref 11.5–15.5)
WBC Count: 13.7 10*3/uL — ABNORMAL HIGH (ref 4.0–10.5)
nRBC: 0 % (ref 0.0–0.2)

## 2021-02-05 LAB — CMP (CANCER CENTER ONLY)
ALT: 13 U/L (ref 0–44)
AST: 10 U/L — ABNORMAL LOW (ref 15–41)
Albumin: 3.5 g/dL (ref 3.5–5.0)
Alkaline Phosphatase: 62 U/L (ref 38–126)
Anion gap: 6 (ref 5–15)
BUN: 27 mg/dL — ABNORMAL HIGH (ref 8–23)
CO2: 20 mmol/L — ABNORMAL LOW (ref 22–32)
Calcium: 9.4 mg/dL (ref 8.9–10.3)
Chloride: 115 mmol/L — ABNORMAL HIGH (ref 98–111)
Creatinine: 1.66 mg/dL — ABNORMAL HIGH (ref 0.61–1.24)
GFR, Estimated: 40 mL/min — ABNORMAL LOW (ref >60)
Glucose, Bld: 170 mg/dL — ABNORMAL HIGH (ref 70–99)
Potassium: 4.7 mmol/L (ref 3.5–5.1)
Sodium: 141 mmol/L (ref 135–145)
Total Bilirubin: 1.3 mg/dL — ABNORMAL HIGH (ref 0.3–1.2)
Total Protein: 6.4 g/dL — ABNORMAL LOW (ref 6.5–8.1)

## 2021-02-05 NOTE — Progress Notes (Signed)
[facility] PROGRESS NOTE 02/05/21   DIAGNOSIS: 87 year old man with CLL presented with lymphocytosis in 2007.     CURRENT THERAPY: Active surveillance.  INTERVAL HISTORY:  Kevin Rogers is here for a follow-up visit.  Since the last visit, he reports feeling well without any major complaints.  He denies any nausea, vomiting or abdominal pain.  He denies any fatigue or tiredness.  He denies any dizziness or lightheadedness.  He continues to ambulate with the help of a walker.  He denies any recurrent infections or lymphadenopathy.       PHYSICAL EXAMINATION:  Blood pressure (!) 131/58, pulse (!) 36, temperature 99.2 F (37.3 C), resp. rate 18, weight 250 lb 3.2 oz (113.5 kg), SpO2 100 %.   ECOG 1   General appearance: Alert, awake without any distress. Head: Atraumatic without abnormalities Oropharynx: Without any thrush or ulcers. Eyes: No scleral icterus. Lymph nodes: No lymphadenopathy noted in the cervical, supraclavicular, or axillary nodes Heart: Regular slow rhythm without any murmurs or gallops. Lung: Clear to auscultation without any rhonchi, wheezes or dullness to percussion. Abdomin: Soft, nontender without any shifting dullness or ascites. Musculoskeletal: No clubbing or cyanosis. Neurological: No motor or sensory deficits. Skin: No rashes or lesions.     CBC    Component Value Date/Time   WBC 18.0 (H) 08/07/2020 1601   RBC 4.57 08/07/2020 1601   HGB 13.5 08/07/2020 1601   HGB 12.5 (L) 02/07/2020 1252   HGB 13.3 02/09/2017 0851   HCT 39.9 08/07/2020 1601   HCT 39.8 02/09/2017 0851   PLT 144 08/07/2020 1601   PLT 151 02/07/2020 1252   PLT 144 02/09/2017 0851   MCV 87.3 08/07/2020 1601   MCV 89.4 02/09/2017 0851   MCH 29.5 08/07/2020 1601   MCHC 33.8 08/07/2020 1601   RDW 14.2 08/07/2020 1601   RDW 13.5 02/09/2017 0851   LYMPHSABS 13,734 (H) 08/07/2020 1601   LYMPHSABS 10.5 (H) 02/09/2017 0851   MONOABS 0.4 02/07/2020  1252   MONOABS 0.2 02/09/2017 0851   EOSABS 72 08/07/2020 1601   EOSABS 0.1 02/09/2017 0851   BASOSABS 18 08/07/2020 1601   BASOSABS 0.0 02/09/2017 0851       Assessment and plan:  87 year old man with:   1.  CLL presented with lymphocytosis and stage 0 disease in 2007.   His disease status was updated at this time laboratory data testing were reviewed.  His lymphocytosis remains reasonably mild without any symptoms to suggest need for treatment.  CBC from today showed a white cell count 13.7 without any other hematological abnormalities.  At this time I recommended continued active surveillance without any indication for treatment.  Oral targeted therapy chemoimmunotherapy all options in the future if he has progression of disease or indication for treatment.   2.  Cytopenias: Mild thrombocytopenia that is overall stable and asymptomatic.  Does not warrant indication for treatment.  3.  Bradycardia: Previous EKGs have indicated sinus bradycardia that is asymptomatic.  He has follow-up with his primary care physician next week and will alert Dr. Yong Channel to these findings.   4. Follow-up: He will return in 12 months for repeat follow-up.  20  minutes were spent on this encounter.  Time was dedicated to reviewing laboratory data, disease status update and future plan of care review.    Kevin Button MD 02/05/21

## 2021-02-08 ENCOUNTER — Other Ambulatory Visit: Payer: Self-pay

## 2021-02-08 ENCOUNTER — Ambulatory Visit (INDEPENDENT_AMBULATORY_CARE_PROVIDER_SITE_OTHER): Payer: Medicare Other | Admitting: Family Medicine

## 2021-02-08 ENCOUNTER — Encounter: Payer: Self-pay | Admitting: Family Medicine

## 2021-02-08 VITALS — BP 118/74 | HR 58 | Temp 98.0°F | Ht 72.0 in | Wt 251.8 lb

## 2021-02-08 DIAGNOSIS — N183 Chronic kidney disease, stage 3 unspecified: Secondary | ICD-10-CM

## 2021-02-08 DIAGNOSIS — N401 Enlarged prostate with lower urinary tract symptoms: Secondary | ICD-10-CM | POA: Diagnosis not present

## 2021-02-08 DIAGNOSIS — E538 Deficiency of other specified B group vitamins: Secondary | ICD-10-CM | POA: Diagnosis not present

## 2021-02-08 DIAGNOSIS — N182 Chronic kidney disease, stage 2 (mild): Secondary | ICD-10-CM

## 2021-02-08 DIAGNOSIS — M48061 Spinal stenosis, lumbar region without neurogenic claudication: Secondary | ICD-10-CM

## 2021-02-08 DIAGNOSIS — E1122 Type 2 diabetes mellitus with diabetic chronic kidney disease: Secondary | ICD-10-CM

## 2021-02-08 DIAGNOSIS — I10 Essential (primary) hypertension: Secondary | ICD-10-CM | POA: Diagnosis not present

## 2021-02-08 DIAGNOSIS — C9110 Chronic lymphocytic leukemia of B-cell type not having achieved remission: Secondary | ICD-10-CM

## 2021-02-08 DIAGNOSIS — Z23 Encounter for immunization: Secondary | ICD-10-CM | POA: Diagnosis not present

## 2021-02-08 DIAGNOSIS — R351 Nocturia: Secondary | ICD-10-CM

## 2021-02-08 DIAGNOSIS — E785 Hyperlipidemia, unspecified: Secondary | ICD-10-CM | POA: Diagnosis not present

## 2021-02-08 DIAGNOSIS — Z0000 Encounter for general adult medical examination without abnormal findings: Secondary | ICD-10-CM

## 2021-02-08 LAB — POC URINALSYSI DIPSTICK (AUTOMATED)
Bilirubin, UA: NEGATIVE
Blood, UA: NEGATIVE
Glucose, UA: NEGATIVE
Ketones, UA: NEGATIVE
Leukocytes, UA: NEGATIVE
Nitrite, UA: NEGATIVE
Protein, UA: NEGATIVE
Spec Grav, UA: 1.015 (ref 1.010–1.025)
Urobilinogen, UA: 0.2 E.U./dL
pH, UA: 6 (ref 5.0–8.0)

## 2021-02-08 NOTE — Addendum Note (Signed)
Addended by: Loura Back on: 02/08/2021 04:32 PM   Modules accepted: Orders

## 2021-02-08 NOTE — Patient Instructions (Addendum)
Health Maintenance Due  Topic Date Due   INFLUENZA VACCINE -High dose shot done today in office.  11/30/2020   COVID-19 Vaccine (5 - Booster)  - Team please get the date of his bivalent shot and put it in our system.  12/06/2020   Please stop by lab before you go If you have mychart- we will send your results within 3 business days of [place] receiving them.  If you do not have mychart- we will call you about results within 5 business days of [place] receiving them.  *please also note that you will see labs on mychart as soon as they post. I will later go in and write notes on them- will say "notes from Dr. Yong Channel"  Recommended follow up: Return in about 6 months (around 08/09/2021) for follow-up or sooner if needed.

## 2021-02-09 ENCOUNTER — Telehealth: Payer: Self-pay

## 2021-02-09 LAB — CBC WITH DIFFERENTIAL/PLATELET
Basophils Absolute: 0 10*3/uL (ref 0.0–0.1)
Basophils Relative: 0.2 % (ref 0.0–3.0)
Eosinophils Absolute: 0.1 10*3/uL (ref 0.0–0.7)
Eosinophils Relative: 0.8 % (ref 0.0–5.0)
HCT: 39.8 % (ref 39.0–52.0)
Hemoglobin: 12.7 g/dL — ABNORMAL LOW (ref 13.0–17.0)
Lymphocytes Relative: 71 % — ABNORMAL HIGH (ref 12.0–46.0)
Lymphs Abs: 9.4 10*3/uL — ABNORMAL HIGH (ref 0.7–4.0)
MCHC: 32 g/dL (ref 30.0–36.0)
MCV: 91.2 fl (ref 78.0–100.0)
Monocytes Absolute: 0.3 10*3/uL (ref 0.1–1.0)
Monocytes Relative: 2.5 % — ABNORMAL LOW (ref 3.0–12.0)
Neutro Abs: 3.4 10*3/uL (ref 1.4–7.7)
Neutrophils Relative %: 25.5 % — ABNORMAL LOW (ref 43.0–77.0)
Platelets: 139 10*3/uL — ABNORMAL LOW (ref 150.0–400.0)
RBC: 4.36 Mil/uL (ref 4.22–5.81)
RDW: 13.9 % (ref 11.5–15.5)
WBC: 13.3 10*3/uL — ABNORMAL HIGH (ref 4.0–10.5)

## 2021-02-09 LAB — COMPREHENSIVE METABOLIC PANEL
ALT: 14 U/L (ref 0–53)
AST: 18 U/L (ref 0–37)
Albumin: 3.9 g/dL (ref 3.5–5.2)
Alkaline Phosphatase: 52 U/L (ref 39–117)
BUN: 33 mg/dL — ABNORMAL HIGH (ref 6–23)
CO2: 24 mEq/L (ref 19–32)
Calcium: 9.6 mg/dL (ref 8.4–10.5)
Chloride: 109 mEq/L (ref 96–112)
Creatinine, Ser: 1.6 mg/dL — ABNORMAL HIGH (ref 0.40–1.50)
GFR: 38.65 mL/min — ABNORMAL LOW (ref >60.00)
Glucose, Bld: 167 mg/dL — ABNORMAL HIGH (ref 70–99)
Potassium: 4.8 mEq/L (ref 3.5–5.1)
Sodium: 140 mEq/L (ref 135–145)
Total Bilirubin: 1.1 mg/dL (ref 0.2–1.2)
Total Protein: 6.4 g/dL (ref 6.0–8.3)

## 2021-02-09 LAB — LIPID PANEL
Cholesterol: 151 mg/dL (ref 0–200)
HDL: 58.6 mg/dL (ref >39.00)
LDL Cholesterol: 76 mg/dL (ref 0–99)
NonHDL: 92.49
Total CHOL/HDL Ratio: 3
Triglycerides: 82 mg/dL (ref 0.0–149.0)
VLDL: 16.4 mg/dL (ref 0.0–40.0)

## 2021-02-09 LAB — HEMOGLOBIN A1C: Hgb A1c MFr Bld: 6.8 % — ABNORMAL HIGH (ref 4.6–6.5)

## 2021-02-09 NOTE — Telephone Encounter (Signed)
Pt stated he has a missed call from [place]. Please Advise.

## 2021-02-10 NOTE — Telephone Encounter (Signed)
Patient given lab results. 

## 2021-02-24 ENCOUNTER — Other Ambulatory Visit: Payer: Self-pay | Admitting: Family Medicine

## 2021-03-11 ENCOUNTER — Other Ambulatory Visit: Payer: Self-pay | Admitting: Family Medicine

## 2021-04-01 NOTE — Progress Notes (Signed)
Chronic Care Management Pharmacy Note  04/05/2021 Name:  Kevin Rogers MRN:  [id number] DOB:  07-27-1931   Summary: Patient is due for diabetic foot exam.  No changes to meds glucose remains controlled.  Subjective: Kevin Rogers is an 87 y.o. year old male who is a primary patient of Hunter, Brayton Mars, MD.  The CCM team was consulted for assistance with disease management and care coordination needs.    Engaged with patient by telephone for follow up visit in response to provider referral for pharmacy case management and/or care coordination services.   Consent to Services:  The patient was given information about Chronic Care Management services, agreed to services, and gave verbal consent prior to initiation of services.  Please see initial visit note for detailed documentation.   Patient Care Team: [name], MD as PCP - General (Family Medicine) [name], MD as Consulting Physician (Neurosurgery) Ralene Bathe, MD as Consulting Physician (Ophthalmology) Alen Blew, Mathis Dad, MD as Consulting Physician (Oncology) Marlaine Hind, MD as Consulting Physician (Physical Medicine and Rehabilitation) Trula Slade, DPM as Consulting Physician (Podiatry) Edythe Clarity, [facility] (Pharmacist)  Recent office visits:  7.15.22 [name] LPN. Annual Wellness. No Med changes. No follow ups on file.   Recent consult visits:  None since 10/20/20   Hospital visits:  None since 10/20/20  Objective: Lab Results  Component Value Date   CREATININE 1.60 (H) 02/08/2021   CREATININE 1.66 (H) 02/05/2021   CREATININE 1.54 (H) 08/07/2020   Lab Results  Component Value Date   HGBA1C 6.8 (H) 02/08/2021   Last diabetic Eye exam:  Lab Results  Component Value Date/Time   HMDIABEYEEXA No Retinopathy 10/28/2020 12:00 AM    Last diabetic Foot exam: No results found for: HMDIABFOOTEX     Component Value Date/Time   CHOL 151 02/08/2021 1530   TRIG 82.0 02/08/2021 1530    HDL 58.60 02/08/2021 1530   CHOLHDL 3 02/08/2021 1530   VLDL 16.4 02/08/2021 1530   LDLCALC 76 02/08/2021 1530   LDLCALC 85 03/16/2020 1630   LDLDIRECT 76.0 11/23/2017 0919   Hepatic Function Latest Ref Rng & Units 02/08/2021 02/05/2021 08/07/2020  Total Protein 6.0 - 8.3 g/dL 6.4 6.4(L) 6.3  Albumin 3.5 - 5.2 g/dL 3.9 3.5 -  AST 0 - 37 U/L 18 10(L) 12  ALT 0 - 53 U/L 14 13 18   Alk Phosphatase 39 - 117 U/L 52 62 -  Total Bilirubin 0.2 - 1.2 mg/dL 1.1 1.3(H) 0.8  Bilirubin, Direct 0.0 - 0.3 mg/dL - - -   Lab Results  Component Value Date/Time   TSH 0.93 07/08/2015 08:18 AM   TSH 0.618 02/10/2015 04:30 AM   TSH 2.35 07/23/2012 08:17 AM   CBC Latest Ref Rng & Units 02/08/2021 02/05/2021 08/07/2020  WBC 4.0 - 10.5 K/uL 13.3(H) 13.7(H) 18.0(H)  Hemoglobin 13.0 - 17.0 g/dL 12.7(L) 12.8(L) 13.5  Hematocrit 39.0 - 52.0 % 39.8 39.1 39.9  Platelets 150.0 - 400.0 K/uL 139.0(L) 142(L) 144   No results found for: VD25OH  Clinical ASCVD: No  The ASCVD Risk score (Arnett DK, et al., 2019) failed to calculate for the following reasons:   The 2019 ASCVD risk score is only valid for ages 76 to 28    Social History   Tobacco Use  Smoking Status Former   Packs/day: 0.25   Years: 20.00   Pack years: 5.00   Types: Cigarettes   Quit date: 09/28/1995   Years since quitting: 25.5  Smokeless Tobacco  Never   BP Readings from Last 3 Encounters:  02/08/21 118/74  02/05/21 (!) 131/58  08/07/20 (!) 114/56   Pulse Readings from Last 3 Encounters:  02/08/21 (!) 58  02/05/21 (!) 36  08/07/20 (!) 57   Wt Readings from Last 3 Encounters:  02/08/21 251 lb 12.8 oz (114.2 kg)  02/05/21 250 lb 3.2 oz (113.5 kg)  08/07/20 250 lb 12.8 oz (113.8 kg)    Assessment: Review of patient past medical history, allergies, medications, health status, including review of consultants reports, laboratory and other test data, was performed as part of comprehensive evaluation and provision of chronic care management  services.   SDOH:  (Social Determinants of Health) assessments and interventions performed: Yes  CCM Care Plan No Known Allergies Medications Reviewed Today     Reviewed by Edythe Clarity, [facility] (Pharmacist) on 04/05/21 at 1101  Med List Status: <None>   Medication Order Taking? Sig Documenting Provider Last Dose Status Informant  atorvastatin (LIPITOR) 10 MG tablet [id number] Yes TAKE 1 TABLET BY MOUTH ONCE WEEKLY [name], MD Taking Active   BD PEN NEEDLE NANO 2ND GEN 32G X 4 MM MISC [id number] Yes USE AS DIRECTED [name], MD Taking Active   finasteride (PROSCAR) 5 MG tablet [id number] Yes TAKE 1 TABLET BY MOUTH EVERY DAY [name], MD Taking Active   glipiZIDE (GLUCOTROL XL) 5 MG 24 hr tablet [id number] Yes TAKE 1 TABLET BY MOUTH EVERY DAY [name], MD Taking Active   glucose blood [facility] VERIO) test strip [id number] Yes USE TO TEST BLOOD SUGARS DAILY. DX: E11.9 [name], MD Taking Active   HYDROcodone-acetaminophen (NORCO/VICODIN) 5-325 MG tablet [id number] Yes Take 1-2 tablets by mouth every 6 (six) hours as needed. [provider] Taking Active   insulin degludec (TRESIBA FLEXTOUCH) 100 UNIT/ML FlexTouch Pen [id number] Yes Inject 0.2-0.3 mLs (20-30 Units total) into the skin daily. We will also sign for any needed supplies  Patient taking differently: Inject 16 Units into the skin. Inject 0.2-0.3 mLs (20-30 Units total) into the skin daily. We will also sign for any needed supplies   [name], MD Taking Active   losartan (COZAAR) 100 MG tablet [id number] Yes TAKE 1 TABLET BY MOUTH  DAILY  Patient taking differently: Take 50 mg by mouth daily.   [name], MD Taking Active   Misc Natural Products (GLUCOSAMINE CHOND CMP ADVANCED PO) [id number] Yes Take by mouth in the morning and at bedtime. [provider] Taking Active Self  MODERNA COVID-19 VACCINE 100 MCG/0.5ML injection [id number] Yes  [provider]  Taking Active   ONE TOUCH LANCETS [place] [id number] Yes Use to test blood sugars. Dx:E11.9 [name], MD Taking Active Self  tamsulosin (FLOMAX) 0.4 MG CAPS capsule [id number] Yes TAKE 1 CAPSULE BY MOUTH  DAILY [name], MD Taking Active   timolol (TIMOPTIC) 0.5 % ophthalmic solution [id number] Yes 1 drop 2 (two) times daily. [provider] Taking Active   TRAVATAN Z 0.004 % SOLN ophthalmic solution [id number] Yes Place 1 drop into the left eye at bedtime.  [provider] Taking Active Self           Med Note Leonides Schanz, JENNIFER L   Wed Dec 03, 2014  9:34 AM) Received from: External Pharmacy  vitamin B-12 (CYANOCOBALAMIN) 1000 MCG tablet [id number] Yes Take 1,000 mcg by mouth daily. [provider] Taking Active  Patient Active Problem List   Diagnosis Date Noted   Enteritis due to Clostridium difficile    Discitis of lumbar region    History of osteomyelitis of lumbar vertebrae    Hyperlipidemia    CKD (chronic kidney disease), stage III ([place]) 01/13/2015   Lumbar spinal stenosis 06/26/2014   BPH (benign prostatic hyperplasia) 06/24/2014   B12 deficiency 12/12/2008   Diabetic polyneuropathy ([place]) 12/12/2008   ERECTILE DYSFUNCTION 03/18/2008   Chronic lymphocytic leukemia ([place]) 03/08/2007   Diabetes mellitus type II, controlled ([place]) 03/08/2007   Gout 03/08/2007   Glaucoma 03/08/2007   Essential hypertension 03/08/2007   Osteoarthritis 03/08/2007   Immunization History  Administered Date(s) Administered   Fluad Quad(high Dose 65+) 01/02/2019, 02/27/2020, 02/08/2021   Influenza Split 02/10/2011, 01/11/2012   Influenza Whole 03/15/2007, 01/31/2008, 02/04/2009, 02/01/2010   Influenza, High Dose Seasonal PF 01/18/2016, 01/24/2017, 01/29/2018   Influenza,inj,Quad PF,6+ Mos 02/12/2014, 01/13/2015   Moderna Sars-Covid-2 Vaccination 05/24/2019, 06/10/2019, 02/29/2020, 08/06/2020   Pneumococcal Conjugate-13 03/23/2015   Pneumococcal  Polysaccharide-23 03/24/2009, 02/12/2014   Td 03/09/2007   Tdap 09/19/2017   Conditions to be addressed/monitored: HTN, HLD and DMII  Care Plan : [place]  Updates made by Edythe Clarity, RPH since 04/05/2021 12:00 AM     Problem: HLD DMII HTN      Long-Range Goal: Disease Management   Start Date: 07/06/2020  Expected End Date: 07/06/2021  Recent Progress: On track  Priority: High  Note:   Pharmacist Clinical Goal(s):  Over the next 365 days, patient will verbalize ability to afford treatment regimen through collaboration with PharmD and provider.   Interventions: 1:1 collaboration with [name], MD regarding development and update of comprehensive plan of care as evidenced by provider attestation and co-signature Inter-disciplinary care team collaboration (see longitudinal plan of care) Comprehensive medication review performed; medication list updated in electronic medical record  Hypertension (BP goal <140/90) -Controlled -Current treatment: Losartan 100 mg tablet - taking half tablet once daily  -Medications previously tried: valsartan 80 mg once daily  -Current home readings: none provided -Current exercise habits: limited by back pain, gets occasional steroid shots in addition to taking hydrocodone -Denies hypotensive/hypertensive symptoms  -Recommended to continue current medication Counseled on low salt diet  Hyperlipidemia: (LDL goal < 100) -Controlled -Current treatment: Atorvastatin 10 mg once WEEKLY -Medications previously tried: n/a  -No side effects, tolerates once weekly dose  -Counseled on diet and exercise extensively Recommended to continue current medication  Update 04/05/21 Still tolerating weekly dose. No changes needed at this time LDL is close to goal. Continue to work on lifestyle mods. No changes to meds.  Type II Diabetes (A1c goal <7%) -Controlled  -Last seen by PCP 07/2020 - a1c at goal. Denies any low readings  today or within last two weeks. Would drink some juice if he was to run low but has not been an issue past few years. -Current medications:  Glipizide XL 5 mg once daily Tresiba 16 units daily into the skin once daily  -Current home glucose readings CGM: 7 day average 118.  -Denies hypoglycemic/hyperglycemic symptoms -Educated on a1c and FBG goals, BG correction/rule of 15 -Previously discussed reducing hypoglycemic agents and switching to GLP1 but will hold off for time being  Update 04/05/21 7 day average -118 97 is the lowest glucose he has had lately 137 highest reading recently He continues to be controlled on above medication regimen.  Discussed cardio benefits of Ozempic, at this time prefers to continue  on current regimen.  Cautioned on risk of hypoglycemia, however he  does not seem to be having that issues at this time.  A1c remains controlled. Continue current meds, due for diabetic foot exam.  Patient Goals/Self-Care Activities Over the next 365 days, patient will:  - take medications as prescribed collaborate with provider on medication access solutions  Medication Assistance: None required.  Patient affirms current coverage meets needs.           Patient's preferred pharmacy is:  [id number] [place] [address]Phone: [phone]: [phone] [facility] ([place] [address] [place]Phone: [phone]: [phone] [id number] [address]: [phone]: [phone] ([facility]) - [place]: [phone]: [phone] Up:  Patient agrees to Care Plan and  Follow-up.  Plan: Telephone follow up appointment with care management team member scheduled for:  6 months, CPA DM call 3 months   Future Appointments  Date Time Provider [place]7/20/2023  3:15 PM [facility] HEALTH COACH [facility] P[facility]  02/04/2022 10:00 AM [facility] LAB [facility] None  02/04/2022 10:30 AM Shadad, FiMathis DadMD [facility] None    ChBeverly MilchPharmD Clinical Pharmacist  [phone]

## 2021-04-05 ENCOUNTER — Ambulatory Visit (INDEPENDENT_AMBULATORY_CARE_PROVIDER_SITE_OTHER): Payer: Medicare Other

## 2021-04-05 DIAGNOSIS — I10 Essential (primary) hypertension: Secondary | ICD-10-CM

## 2021-04-05 DIAGNOSIS — E785 Hyperlipidemia, unspecified: Secondary | ICD-10-CM

## 2021-04-05 NOTE — Patient Instructions (Addendum)
Visit Information   Goals Addressed             This Visit's Progress    Monitor and Manage My Blood Sugar-Diabetes Type 2       Timeframe:  Long-Range Goal Priority:  High Start Date:  04/05/21                           Expected End Date:  10/04/21                    Follow Up Date 07/04/21    - check blood sugar at prescribed times - check blood sugar if I feel it is too high or too low - enter blood sugar readings and medication or insulin into daily log    Why is this important?   Checking your blood sugar at home helps to keep it from getting very high or very low.  Writing the results in a diary or log helps the doctor know how to care for you.  Your blood sugar log should have the time, date and the results.  Also, write down the amount of insulin or other medicine that you take.  Other information, like what you ate, exercise done and how you were feeling, will also be helpful.     Notes:        Patient Care Plan: CCM Pharmacy Care Plan     Problem Identified: HLD DMII HTN      Long-Range Goal: Disease Management   Start Date: 07/06/2020  Expected End Date: 07/06/2021  Recent Progress: On track  Priority: High  Note:   Pharmacist Clinical Goal(s):  Over the next 365 days, patient will verbalize ability to afford treatment regimen through collaboration with PharmD and provider.   Interventions: 1:1 collaboration with [name], MD regarding development and update of comprehensive plan of care as evidenced by provider attestation and co-signature Inter-disciplinary care team collaboration (see longitudinal plan of care) Comprehensive medication review performed; medication list updated in electronic medical record  Hypertension (BP goal <140/90) -Controlled -Current treatment: Losartan 100 mg tablet - taking half tablet once daily  -Medications previously tried: valsartan 80 mg once daily  -Current home readings: none provided -Current exercise  habits: limited by back pain, gets occasional steroid shots in addition to taking hydrocodone -Denies hypotensive/hypertensive symptoms  -Recommended to continue current medication Counseled on low salt diet  Hyperlipidemia: (LDL goal < 100) -Controlled -Current treatment: Atorvastatin 10 mg once WEEKLY -Medications previously tried: n/a  -No side effects, tolerates once weekly dose  -Counseled on diet and exercise extensively Recommended to continue current medication  Update 04/05/21 Still tolerating weekly dose. No changes needed at this time LDL is close to goal. Continue to work on lifestyle mods. No changes to meds.  Type II Diabetes (A1c goal <7%) -Controlled  -Last seen by PCP 07/2020 - a1c at goal. Denies any low readings today or within last two weeks. Would drink some juice if he was to run low but has not been an issue past few years. -Current medications:  Glipizide XL 5 mg once daily Tresiba 16 units daily into the skin once daily  -Current home glucose readings CGM: 7 day average 118.  -Denies hypoglycemic/hyperglycemic symptoms -Educated on a1c and FBG goals, BG correction/rule of 15 -Previously discussed reducing hypoglycemic agents and switching to GLP1 but will hold off for time being  Update 04/05/21 7 day average -118 97 is  the lowest glucose he has had lately 137 highest reading recently He continues to be controlled on above medication regimen.  Discussed cardio benefits of Ozempic, at this time prefers to continue on current regimen.  Cautioned on risk of hypoglycemia, however he  does not seem to be having that issues at this time.  A1c remains controlled. Continue current meds, due for diabetic foot exam.  Patient Goals/Self-Care Activities Over the next 365 days, patient will:  - take medications as prescribed collaborate with provider on medication access solutions  Medication Assistance: None required.  Patient affirms current coverage meets  needs.         Patient verbalizes understanding of instructions provided today and agrees to view in [place].  Telephone follow up appointment with pharmacy team member scheduled for: 4 months  Edythe Clarity, [place]

## 2021-04-12 DIAGNOSIS — M47814 Spondylosis without myelopathy or radiculopathy, thoracic region: Secondary | ICD-10-CM | POA: Diagnosis not present

## 2021-04-12 DIAGNOSIS — M7542 Impingement syndrome of left shoulder: Secondary | ICD-10-CM | POA: Diagnosis not present

## 2021-04-12 DIAGNOSIS — M961 Postlaminectomy syndrome, not elsewhere classified: Secondary | ICD-10-CM | POA: Diagnosis not present

## 2021-04-12 DIAGNOSIS — M7541 Impingement syndrome of right shoulder: Secondary | ICD-10-CM | POA: Diagnosis not present

## 2021-04-12 DIAGNOSIS — Z79899 Other long term (current) drug therapy: Secondary | ICD-10-CM | POA: Diagnosis not present

## 2021-04-12 DIAGNOSIS — M7918 Myalgia, other site: Secondary | ICD-10-CM | POA: Diagnosis not present

## 2021-04-12 DIAGNOSIS — M461 Sacroiliitis, not elsewhere classified: Secondary | ICD-10-CM | POA: Diagnosis not present

## 2021-04-13 DIAGNOSIS — H401132 Primary open-angle glaucoma, bilateral, moderate stage: Secondary | ICD-10-CM | POA: Diagnosis not present

## 2021-05-01 DIAGNOSIS — I10 Essential (primary) hypertension: Secondary | ICD-10-CM

## 2021-05-01 DIAGNOSIS — E785 Hyperlipidemia, unspecified: Secondary | ICD-10-CM

## 2021-05-15 ENCOUNTER — Other Ambulatory Visit: Payer: Self-pay | Admitting: Family Medicine

## 2021-06-25 ENCOUNTER — Other Ambulatory Visit: Payer: Self-pay | Admitting: Family Medicine

## 2021-07-06 DIAGNOSIS — M48061 Spinal stenosis, lumbar region without neurogenic claudication: Secondary | ICD-10-CM | POA: Diagnosis not present

## 2021-07-06 DIAGNOSIS — M961 Postlaminectomy syndrome, not elsewhere classified: Secondary | ICD-10-CM | POA: Diagnosis not present

## 2021-07-06 DIAGNOSIS — M7542 Impingement syndrome of left shoulder: Secondary | ICD-10-CM | POA: Diagnosis not present

## 2021-07-06 DIAGNOSIS — M461 Sacroiliitis, not elsewhere classified: Secondary | ICD-10-CM | POA: Diagnosis not present

## 2021-07-06 DIAGNOSIS — M7541 Impingement syndrome of right shoulder: Secondary | ICD-10-CM | POA: Diagnosis not present

## 2021-07-06 DIAGNOSIS — M7918 Myalgia, other site: Secondary | ICD-10-CM | POA: Diagnosis not present

## 2021-08-09 ENCOUNTER — Telehealth: Payer: Medicare Other

## 2021-08-16 ENCOUNTER — Other Ambulatory Visit: Payer: Self-pay | Admitting: Family Medicine

## 2021-09-16 ENCOUNTER — Encounter: Payer: Self-pay | Admitting: Family Medicine

## 2021-09-16 ENCOUNTER — Ambulatory Visit (INDEPENDENT_AMBULATORY_CARE_PROVIDER_SITE_OTHER): Payer: Medicare Other | Admitting: Family Medicine

## 2021-09-16 VITALS — BP 138/72 | HR 51 | Temp 97.7°F | Ht 72.0 in | Wt 249.4 lb

## 2021-09-16 DIAGNOSIS — I10 Essential (primary) hypertension: Secondary | ICD-10-CM

## 2021-09-16 DIAGNOSIS — E538 Deficiency of other specified B group vitamins: Secondary | ICD-10-CM | POA: Diagnosis not present

## 2021-09-16 DIAGNOSIS — N182 Chronic kidney disease, stage 2 (mild): Secondary | ICD-10-CM

## 2021-09-16 DIAGNOSIS — N183 Chronic kidney disease, stage 3 unspecified: Secondary | ICD-10-CM | POA: Diagnosis not present

## 2021-09-16 DIAGNOSIS — C9110 Chronic lymphocytic leukemia of B-cell type not having achieved remission: Secondary | ICD-10-CM

## 2021-09-16 DIAGNOSIS — E785 Hyperlipidemia, unspecified: Secondary | ICD-10-CM | POA: Diagnosis not present

## 2021-09-16 DIAGNOSIS — E1122 Type 2 diabetes mellitus with diabetic chronic kidney disease: Secondary | ICD-10-CM

## 2021-09-16 LAB — CBC WITH DIFFERENTIAL/PLATELET
Basophils Absolute: 0 10*3/uL (ref 0.0–0.1)
Basophils Relative: 0.3 % (ref 0.0–3.0)
Eosinophils Absolute: 0.2 10*3/uL (ref 0.0–0.7)
Eosinophils Relative: 1.7 % (ref 0.0–5.0)
HCT: 40.2 % (ref 39.0–52.0)
Hemoglobin: 13 g/dL (ref 13.0–17.0)
Lymphocytes Relative: 60 % — ABNORMAL HIGH (ref 12.0–46.0)
Lymphs Abs: 5.7 10*3/uL — ABNORMAL HIGH (ref 0.7–4.0)
MCHC: 32.5 g/dL (ref 30.0–36.0)
MCV: 91.3 fl (ref 78.0–100.0)
Monocytes Absolute: 0.3 10*3/uL (ref 0.1–1.0)
Monocytes Relative: 3.4 % (ref 3.0–12.0)
Neutro Abs: 3.3 10*3/uL (ref 1.4–7.7)
Neutrophils Relative %: 34.6 % — ABNORMAL LOW (ref 43.0–77.0)
Platelets: 165 10*3/uL (ref 150.0–400.0)
RBC: 4.4 Mil/uL (ref 4.22–5.81)
RDW: 14.8 % (ref 11.5–15.5)
WBC: 9.4 10*3/uL (ref 4.0–10.5)

## 2021-09-16 LAB — COMPREHENSIVE METABOLIC PANEL
ALT: 11 U/L (ref 0–53)
AST: 11 U/L (ref 0–37)
Albumin: 4.1 g/dL (ref 3.5–5.2)
Alkaline Phosphatase: 67 U/L (ref 39–117)
BUN: 28 mg/dL — ABNORMAL HIGH (ref 6–23)
CO2: 25 mEq/L (ref 19–32)
Calcium: 9.7 mg/dL (ref 8.4–10.5)
Chloride: 111 mEq/L (ref 96–112)
Creatinine, Ser: 1.59 mg/dL — ABNORMAL HIGH (ref 0.40–1.50)
GFR: 38.78 mL/min — ABNORMAL LOW (ref >60.00)
Glucose, Bld: 118 mg/dL — ABNORMAL HIGH (ref 70–99)
Potassium: 4.7 mEq/L (ref 3.5–5.1)
Sodium: 144 mEq/L (ref 135–145)
Total Bilirubin: 0.9 mg/dL (ref 0.2–1.2)
Total Protein: 6.7 g/dL (ref 6.0–8.3)

## 2021-09-16 LAB — HEMOGLOBIN A1C: Hgb A1c MFr Bld: 6.3 % (ref 4.6–6.5)

## 2021-09-16 LAB — VITAMIN B12: Vitamin B-12: >1504 pg/mL — ABNORMAL HIGH (ref 211–911)

## 2021-09-16 NOTE — Progress Notes (Signed)
Phone [phone] In person visit   Subjective:   Kevin Rogers is a 88 y.o. year old very pleasant male patient who presents for/with See problem oriented charting Chief Complaint  Patient presents with   Follow-up   Diabetes   Hypertension   Past Medical History-  Patient Active Problem List   Diagnosis Date Noted   Chronic lymphocytic leukemia ([place]) 03/08/2007    Priority: High   Diabetes mellitus type II, controlled ([place]) 03/08/2007    Priority: High   History of osteomyelitis of lumbar vertebrae     Priority: Medium    Hyperlipidemia     Priority: Medium    CKD (chronic kidney disease), stage III ([place]) 01/13/2015    Priority: Medium    Lumbar spinal stenosis 06/26/2014    Priority: Medium    BPH (benign prostatic hyperplasia) 06/24/2014    Priority: Medium    Gout 03/08/2007    Priority: Medium    Essential hypertension 03/08/2007    Priority: Medium    Enteritis due to Clostridium difficile     Priority: Low   Discitis of lumbar region     Priority: Low   B12 deficiency 12/12/2008    Priority: Low   Diabetic polyneuropathy ([place]) 12/12/2008    Priority: Low   ERECTILE DYSFUNCTION 03/18/2008    Priority: Low   Glaucoma 03/08/2007    Priority: Low   Osteoarthritis 03/08/2007    Priority: Low    Medications- reviewed and updated Current Outpatient Medications  Medication Sig Dispense Refill   atorvastatin (LIPITOR) 10 MG tablet TAKE 1 TABLET BY MOUTH ONCE WEEKLY 13 tablet 3   BD PEN NEEDLE NANO 2ND GEN 32G X 4 MM MISC USE AS DIRECTED 100 each 1   finasteride (PROSCAR) 5 MG tablet TAKE 1 TABLET BY MOUTH EVERY DAY 90 tablet 3   glipiZIDE (GLUCOTROL XL) 5 MG 24 hr tablet TAKE 1 TABLET BY MOUTH EVERY DAY 90 tablet 1   glucose blood (ONETOUCH VERIO) test strip USE TO TEST BLOOD SUGARS DAILY. DX: E11.9 100 strip 12   HYDROcodone-acetaminophen (NORCO/VICODIN) 5-325 MG tablet Take 1-2 tablets by mouth every 6 (six) hours as needed.     insulin degludec (TRESIBA  FLEXTOUCH) 100 UNIT/ML FlexTouch Pen Inject 0.2-0.3 mLs (20-30 Units total) into the skin daily. We will also sign for any needed supplies (Patient taking differently: Inject 16 Units into the skin. Inject 0.2-0.3 mLs (20-30 Units total) into the skin daily. We will also sign for any needed supplies) 27 mL 3   losartan (COZAAR) 100 MG tablet TAKE 1 TABLET BY MOUTH  DAILY 90 tablet 3   Misc Natural Products (GLUCOSAMINE CHOND CMP ADVANCED PO) Take by mouth in the morning and at bedtime.     ONE TOUCH LANCETS MISC Use to test blood sugars. Dx:E11.9 100 each 11   tamsulosin (FLOMAX) 0.4 MG CAPS capsule TAKE 1 CAPSULE BY MOUTH  DAILY 90 capsule 3   timolol (TIMOPTIC) 0.5 % ophthalmic solution 1 drop 2 (two) times daily.     TRAVATAN Z 0.004 % SOLN ophthalmic solution Place 1 drop into the left eye at bedtime.      vitamin B-12 (CYANOCOBALAMIN) 1000 MCG tablet Take 1,000 mcg by mouth daily.     No current facility-administered medications for this visit.     Objective:  BP 138/72   Pulse (!) 51   Temp 97.7 F (36.5 C)   Ht 6' (1.829 m)   Wt 249 lb 6.4 oz (113.1 kg)  SpO2 98%   BMI 33.82 kg/m  Gen: NAD, resting comfortably CV: bradycardic but regular no murmurs rubs or gallops Lungs: CTAB no crackles, wheeze, rhonchi Ext: 1+ stable edema Skin: warm, dry Neuro: walks with walker Diabetic Foot Exam - Simple   Simple Foot Form Diabetic Foot exam was performed with the following findings: Yes 09/16/2021 10:39 AM  Visual Inspection No deformities, no ulcerations, no other skin breakdown bilaterally: Yes Sensation Testing Intact to touch and monofilament testing bilaterally: Yes Pulse Check Posterior Tibialis and Dorsalis pulse intact bilaterally: Yes Comments        Assessment and Plan   #hypertension/CKD stage III S: medication: Losartan 100 mg daily Home readings #s: does not check at home  GFR most recently at 38-previously on meloxicam but stopped in 2017 BP Readings from  Last 3 Encounters:  09/16/21 138/72  02/08/21 118/74  02/05/21 (!) 131/58  A/P: bp reasonably stable/Controlled. Continue current medications.   CKD stage III-hopefully stable update CMP today   # Diabetes S: Medication: tresiba 14 units.  Taking Glipizide 5 mg XL -steroid injections tend to raise sugars substantially- denies recent but has one scheduled in june CBGs- 113 this AM usually 80-120- in general few low blood sugars Exercise and diet- No exercise due to immobility  Lab Results  Component Value Date   HGBA1C 6.8 (H) 02/08/2021   HGBA1C 6.6 (H) 08/07/2020   HGBA1C 6.1 (H) 03/16/2020   A/P: has done very well- will update a1c and for now continue current meds  #hyperlipidemia S: Medication:Atorvastatin 10 mgonce a week. Past primary prevention age range-we are trying to target LDL at least around 100 Lab Results  Component Value Date   CHOL 151 02/08/2021   HDL 58.60 02/08/2021   LDLCALC 76 02/08/2021   LDLDIRECT 76.0 11/23/2017   TRIG 82.0 02/08/2021   CHOLHDL 3 02/08/2021    A/P: Controlled. Continue current medications.     #CLL-continues yearly visits with oncology. Diagnosed in 2000. We will check CBC with differential today to make sure stable. Visit 02/05/21 with DR. Shadad- stable at that time  #BPH S: Patient continues to see benefit with finasteride and Flomax A/P: reasonable control- continue current meds   #Lumbar spinal stenosis- Dr. Brien Few in the past S: Limits mobility-chronic back pain. Currently taking Hydrocodone- acetaminophen 1-2 tablets every 6 hours has been on for prolonged period- working with a new doctor getting shots but not medicine Dr. Alease Frame A/P:  difficult situation- hoping new provider can provide relief rom pain- coming off meds after long term rx would be challenging   # B12 deficiency S: Current treatment/medication (oral vs. IM): Oral 1000 mcg Lab Results  Component Value Date   VITAMINB12 1,639 (H) 08/07/2020  A/P: good  control- check b12 today   Recommended follow up: Return in about 6 months (around 03/19/2022) for physical or sooner if needed.Schedule b4 you leave. Future Appointments  Date Time Provider [place]  11/18/2021  3:15 PM [facility] HEALTH COACH [facility]  02/04/2022 10:00 AM [facility] LAB [facility] None  02/04/2022 10:30 AM Shadad, Mathis Dad, MD [facility] None  04/18/2022  2:00 PM [facility] CCM PHARMACIST [facility]    Lab/Order associations:   ICD-10-CM   1. Controlled type 2 diabetes mellitus with stage 2 chronic kidney disease, without long-term current use of insulin (HCC)  E11.22 Hemoglobin A1c   N18.2     2. Stage 3 chronic kidney disease, unspecified whether stage 3a or 3b CKD (HCC)  N18.30  3. Hyperlipidemia, unspecified hyperlipidemia type  E78.5 CBC with Differential/Platelet    Comprehensive metabolic panel    4. Essential hypertension  I10     5. B12 deficiency  E53.8 Vitamin B12      No orders of the defined types were placed in this encounter.   Return precautions advised.  Garret Reddish, MD

## 2021-09-16 NOTE — Patient Instructions (Addendum)
Please stop by lab before you go If you have mychart- we will send your results within 3 business days of [place] receiving them.  If you do not have mychart- we will call you about results within 5 business days of [place] receiving them.  *please also note that you will see labs on mychart as soon as they post. I will later go in and write notes on them- will say "notes from Dr. Yong Channel"   Recommended follow up: Return in about 6 months (around 03/19/2022) for physical or sooner if needed.Schedule b4 you leave.

## 2021-10-05 DIAGNOSIS — M7918 Myalgia, other site: Secondary | ICD-10-CM | POA: Diagnosis not present

## 2021-10-12 ENCOUNTER — Other Ambulatory Visit: Payer: Self-pay | Admitting: Family Medicine

## 2021-10-18 ENCOUNTER — Telehealth: Payer: Self-pay | Admitting: Pharmacist

## 2021-10-18 NOTE — Progress Notes (Cosign Needed)
Chronic Care Management Pharmacy Assistant   Name: Kevin Rogers  MRN: [id number] DOB: 05/18/1931   Reason for Encounter: Diabetes Adherence Call    Recent office visits:  09/16/2021 [facility] (PCP) [name], MD; no medication changes indicated.  Recent consult visits:  None  Hospital visits:  None in previous 6 months  Medications: Outpatient Encounter Medications as of 10/18/2021  Medication Sig Note   atorvastatin (LIPITOR) 10 MG tablet TAKE 1 TABLET BY MOUTH ONCE WEEKLY    BD PEN NEEDLE NANO 2ND GEN 32G X 4 MM MISC USE AS DIRECTED    finasteride (PROSCAR) 5 MG tablet TAKE 1 TABLET BY MOUTH EVERY DAY    glipiZIDE (GLUCOTROL XL) 5 MG 24 hr tablet TAKE 1 TABLET BY MOUTH EVERY DAY    glucose blood (ONETOUCH VERIO) test strip USE TO TEST BLOOD SUGARS DAILY. DX: E11.9    HYDROcodone-acetaminophen (NORCO/VICODIN) 5-325 MG tablet Take 1-2 tablets by mouth every 6 (six) hours as needed.    insulin degludec (TRESIBA FLEXTOUCH) 100 UNIT/ML FlexTouch Pen Inject 0.2-0.3 mLs (20-30 Units total) into the skin daily. We will also sign for any needed supplies (Patient taking differently: Inject 16 Units into the skin. Inject 0.2-0.3 mLs (20-30 Units total) into the skin daily. We will also sign for any needed supplies)    losartan (COZAAR) 100 MG tablet TAKE 1 TABLET BY MOUTH  DAILY    Misc Natural Products (GLUCOSAMINE CHOND CMP ADVANCED PO) Take by mouth in the morning and at bedtime.    ONE TOUCH LANCETS MISC Use to test blood sugars. Dx:E11.9    tamsulosin (FLOMAX) 0.4 MG CAPS capsule TAKE 1 CAPSULE BY MOUTH  DAILY    timolol (TIMOPTIC) 0.5 % ophthalmic solution 1 drop 2 (two) times daily.    TRAVATAN Z 0.004 % SOLN ophthalmic solution Place 1 drop into the left eye at bedtime.  12/03/2014: Received from: External Pharmacy   vitamin B-12 (CYANOCOBALAMIN) 1000 MCG tablet Take 1,000 mcg by mouth daily.    No facility-administered encounter medications on file as of 10/18/2021.   Recent  Relevant Labs: Lab Results  Component Value Date/Time   HGBA1C 6.3 09/16/2021 10:40 AM   HGBA1C 6.8 (H) 02/08/2021 03:30 PM   MICROALBUR 1.3 07/08/2015 08:18 AM   MICROALBUR 1.2 07/23/2012 08:17 AM    Kidney Function Lab Results  Component Value Date/Time   CREATININE 1.59 (H) 09/16/2021 10:40 AM   CREATININE 1.60 (H) 02/08/2021 03:30 PM   CREATININE 1.66 (H) 02/05/2021 09:28 AM   CREATININE 1.54 (H) 08/07/2020 04:01 PM   CREATININE 1.43 (H) 03/16/2020 04:30 PM   CREATININE 1.3 06/09/2016 09:34 AM   CREATININE 1.5 (H) 12/08/2015 08:29 AM   GFR 38.78 (L) 09/16/2021 10:40 AM   GFRNONAA 40 (L) 02/05/2021 09:28 AM   GFRNONAA 40 (L) 08/07/2020 04:01 PM   GFRAA 47 (L) 08/07/2020 04:01 PM    Current antihyperglycemic regimen:  Glipizide 5 mg daily Tresiba 20-30 units daily  What recent interventions/DTPs have been made to improve glycemic control:  No recent interventions or DTPs.  Have there been any recent hospitalizations or ED visits since last visit with CPP? No  Patient denies hypoglycemic symptoms.  Patient denies hyperglycemic symptoms.  How often are you checking your blood sugar? once daily  What are your blood sugars ranging?  Fasting: 129, 149  During the week, how often does your blood glucose drop below 70? Never  Are you checking your feet daily/regularly? Yes  Adherence Review:  Is the patient currently on a STATIN medication? Yes Is the patient currently on ACE/ARB medication? Yes Does the patient have >5 day gap between last estimated fill dates? No   Care Gaps: Medicare Annual Wellness: Completed 11/13/2020 Ophthalmology Exam: Next due on 10/28/2021 Foot Exam: Next due on 09/17/2022 Hemoglobin A1C: 6.3% on 09/16/2021 Colonoscopy: Completed 04/09/2007  Future Appointments  Date Time Provider [place]  11/18/2021  3:15 PM [facility] HEALTH COACH [facility]  02/04/2022 10:00 AM [facility] LAB [facility] None  02/04/2022 10:30 AM Wyatt Portela, MD [facility] None  03/29/2022  9:00 AM [name], MD [facility]  04/18/2022  2:00 PM [facility] PHARMACIST [facility]   Star Rating Drugs: Atorvastatin 10 mg last filled 09/24/2021 90 DS Glipizide 5 mg last filled 07/19/2021 90 DS Losartan 100 mg last filled 10/14/2021 90 DS  April D Calhoun, [facility] Pharmacist Assistant [phone]

## 2021-11-04 DIAGNOSIS — M7918 Myalgia, other site: Secondary | ICD-10-CM | POA: Diagnosis not present

## 2021-11-04 DIAGNOSIS — M48061 Spinal stenosis, lumbar region without neurogenic claudication: Secondary | ICD-10-CM | POA: Diagnosis not present

## 2021-11-04 DIAGNOSIS — M961 Postlaminectomy syndrome, not elsewhere classified: Secondary | ICD-10-CM | POA: Diagnosis not present

## 2021-11-09 DIAGNOSIS — H5203 Hypermetropia, bilateral: Secondary | ICD-10-CM | POA: Diagnosis not present

## 2021-11-09 DIAGNOSIS — E119 Type 2 diabetes mellitus without complications: Secondary | ICD-10-CM | POA: Diagnosis not present

## 2021-11-09 DIAGNOSIS — H2513 Age-related nuclear cataract, bilateral: Secondary | ICD-10-CM | POA: Diagnosis not present

## 2021-11-09 DIAGNOSIS — H401132 Primary open-angle glaucoma, bilateral, moderate stage: Secondary | ICD-10-CM | POA: Diagnosis not present

## 2021-11-18 ENCOUNTER — Ambulatory Visit: Payer: Medicare Other

## 2021-11-29 ENCOUNTER — Ambulatory Visit (INDEPENDENT_AMBULATORY_CARE_PROVIDER_SITE_OTHER): Payer: Medicare Other

## 2021-11-29 DIAGNOSIS — Z0000 Encounter for general adult medical examination without abnormal findings: Secondary | ICD-10-CM | POA: Diagnosis not present

## 2021-11-29 NOTE — Patient Instructions (Signed)
Mr. Kevin Rogers , Thank you for taking time to come for your Medicare Wellness Visit. I appreciate your ongoing commitment to your health goals. Please review the following plan we discussed and let me know if I can assist you in the future.   Screening recommendations/referrals: Colonoscopy: no longer required  Recommended yearly ophthalmology/optometry visit for glaucoma screening and checkup Recommended yearly dental visit for hygiene and checkup  Vaccinations: Influenza vaccine: done 02/08/21 repeat every year  Pneumococcal vaccine: Up to date Tdap vaccine: Done 09/19/17 repeat every 10 years  Shingles vaccine: Shingrix discussed. Please contact your pharmacy for coverage information.    Covid-19: completed 1/22, 2/8, 02/29/20 & 08/06/20  Advanced directives: Please bring a copy of your health care power of attorney and living will to the office at your convenience.  Conditions/risks identified: none at this time   Next appointment: Follow up in one year for your annual wellness visit.   Preventive Care 86 Years and Older, Male Preventive care refers to lifestyle choices and visits with your health care provider that can promote health and wellness. What does preventive care include? A yearly physical exam. This is also called an annual well check. Dental exams once or twice a year. Routine eye exams. Ask your health care provider how often you should have your eyes checked. Personal lifestyle choices, including: Daily care of your teeth and gums. Regular physical activity. Eating a healthy diet. Avoiding tobacco and drug use. Limiting alcohol use. Practicing safe sex. Taking low doses of aspirin every day. Taking vitamin and mineral supplements as recommended by your health care provider. What happens during an annual well check? The services and screenings done by your health care provider during your annual well check will depend on your age, overall health, lifestyle risk factors,  and family history of disease. Counseling  Your health care provider may ask you questions about your: Alcohol use. Tobacco use. Drug use. Emotional well-being. Home and relationship well-being. Sexual activity. Eating habits. History of falls. Memory and ability to understand (cognition). Work and work Statistician. Screening  You may have the following tests or measurements: Height, weight, and BMI. Blood pressure. Lipid and cholesterol levels. These may be checked every 5 years, or more frequently if you are over 71 years old. Skin check. Lung cancer screening. You may have this screening every year starting at age 86 if you have a 30-pack-year history of smoking and currently smoke or have quit within the past 15 years. Fecal occult blood test (FOBT) of the stool. You may have this test every year starting at age 86. Flexible sigmoidoscopy or colonoscopy. You may have a sigmoidoscopy every 5 years or a colonoscopy every 10 years starting at age 86. Prostate cancer screening. Recommendations will vary depending on your family history and other risks. Hepatitis C blood test. Hepatitis B blood test. Sexually transmitted disease (STD) testing. Diabetes screening. This is done by checking your blood sugar (glucose) after you have not eaten for a while (fasting). You may have this done every 1-3 years. Abdominal aortic aneurysm (AAA) screening. You may need this if you are a current or former smoker. Osteoporosis. You may be screened starting at age 26 if you are at high risk. Talk with your health care provider about your test results, treatment options, and if necessary, the need for more tests. Vaccines  Your health care provider may recommend certain vaccines, such as: Influenza vaccine. This is recommended every year. Tetanus, diphtheria, and acellular pertussis (Tdap, Td) vaccine.  You may need a Td booster every 10 years. Zoster vaccine. You may need this after age  86. Pneumococcal 13-valent conjugate (PCV13) vaccine. One dose is recommended after age 39. Pneumococcal polysaccharide (PPSV23) vaccine. One dose is recommended after age 40. Talk to your health care provider about which screenings and vaccines you need and how often you need them. This information is not intended to replace advice given to you by your health care provider. Make sure you discuss any questions you have with your health care provider. Document Released: 05/15/2015 Document Revised: 01/06/2016 Document Reviewed: 02/17/2015 Elsevier Interactive Patient Education  2017 [place] Prevention in the Home Falls can cause injuries. They can happen to people of all ages. There are many things you can do to make your home safe and to help prevent falls. What can I do on the outside of my home? Regularly fix the edges of walkways and driveways and fix any cracks. Remove anything that might make you trip as you walk through a door, such as a raised step or threshold. Trim any bushes or trees on the path to your home. Use bright outdoor lighting. Clear any walking paths of anything that might make someone trip, such as rocks or tools. Regularly check to see if handrails are loose or broken. Make sure that both sides of any steps have handrails. Any raised decks and porches should have guardrails on the edges. Have any leaves, snow, or ice cleared regularly. Use sand or salt on walking paths during winter. Clean up any spills in your garage right away. This includes oil or grease spills. What can I do in the bathroom? Use night lights. Install grab bars by the toilet and in the tub and shower. Do not use towel bars as grab bars. Use non-skid mats or decals in the tub or shower. If you need to sit down in the shower, use a plastic, non-slip stool. Keep the floor dry. Clean up any water that spills on the floor as soon as it happens. Remove soap buildup in the tub or shower  regularly. Attach bath mats securely with double-sided non-slip rug tape. Do not have throw rugs and other things on the floor that can make you trip. What can I do in the bedroom? Use night lights. Make sure that you have a light by your bed that is easy to reach. Do not use any sheets or blankets that are too big for your bed. They should not hang down onto the floor. Have a firm chair that has side arms. You can use this for support while you get dressed. Do not have throw rugs and other things on the floor that can make you trip. What can I do in the kitchen? Clean up any spills right away. Avoid walking on wet floors. Keep items that you use a lot in easy-to-reach places. If you need to reach something above you, use a strong step stool that has a grab bar. Keep electrical cords out of the way. Do not use floor polish or wax that makes floors slippery. If you must use wax, use non-skid floor wax. Do not have throw rugs and other things on the floor that can make you trip. What can I do with my stairs? Do not leave any items on the stairs. Make sure that there are handrails on both sides of the stairs and use them. Fix handrails that are broken or loose. Make sure that handrails are as long as  the stairways. Check any carpeting to make sure that it is firmly attached to the stairs. Fix any carpet that is loose or worn. Avoid having throw rugs at the top or bottom of the stairs. If you do have throw rugs, attach them to the floor with carpet tape. Make sure that you have a light switch at the top of the stairs and the bottom of the stairs. If you do not have them, ask someone to add them for you. What else can I do to help prevent falls? Wear shoes that: Do not have high heels. Have rubber bottoms. Are comfortable and fit you well. Are closed at the toe. Do not wear sandals. If you use a stepladder: Make sure that it is fully opened. Do not climb a closed stepladder. Make sure that  both sides of the stepladder are locked into place. Ask someone to hold it for you, if possible. Clearly mark and make sure that you can see: Any grab bars or handrails. First and last steps. Where the edge of each step is. Use tools that help you move around (mobility aids) if they are needed. These include: Canes. Walkers. Scooters. Crutches. Turn on the lights when you go into a dark area. Replace any light bulbs as soon as they burn out. Set up your furniture so you have a clear path. Avoid moving your furniture around. If any of your floors are uneven, fix them. If there are any pets around you, be aware of where they are. Review your medicines with your doctor. Some medicines can make you feel dizzy. This can increase your chance of falling. Ask your doctor what other things that you can do to help prevent falls. This information is not intended to replace advice given to you by your health care provider. Make sure you discuss any questions you have with your health care provider. Document Released: 02/12/2009 Document Revised: 09/24/2015 Document Reviewed: 05/23/2014 Elsevier Interactive Patient Education  2017 [employer].

## 2021-11-29 NOTE — Progress Notes (Signed)
Virtual Visit via Telephone Note  I connected with  Kevin Rogers on 11/29/21 at  2:45 PM EDT by telephone and verified that I am speaking with the correct person using two identifiers.  Medicare Annual Wellness visit completed telephonically due to Covid-19 pandemic.   Persons participating in this call: This Health Coach and this patient.   Location: Patient: home Provider: office    I discussed the limitations, risks, security and privacy concerns of performing an evaluation and management service by telephone and the availability of in person appointments. The patient expressed understanding and agreed to proceed.  Unable to perform video visit due to video visit attempted and failed and/or patient does not have video capability.   Some vital signs may be absent or patient reported.   Willette Brace, LPN   Subjective:   Kevin Rogers is a 88 y.o. male who presents for Medicare Annual/Subsequent preventive examination.  Review of Systems     Cardiac Risk Factors include: advanced age (>28mn, >>37women);diabetes mellitus;hypertension;dyslipidemia;male gender;obesity (BMI >30kg/m2)     Objective:    There were no vitals filed for this visit. There is no height or weight on file to calculate BMI.     11/29/2021    2:58 PM 11/13/2020    2:34 PM 02/07/2020    1:08 PM 09/12/2019    8:37 AM 07/17/2019    2:09 PM 01/16/2019    3:26 PM 02/26/2018    7:47 PM  Advanced Directives  Does Patient Have a Medical Advance Directive? Yes Yes Yes Yes No Yes No  Type of Advance Directive Healthcare Power of Attorney Living will Living will Living will;Healthcare Power of Attorney  Living will;Healthcare Power of Attorney   Does patient want to make changes to medical advance directive?   No - Patient declined No - Patient declined  No - Patient declined   Copy of HPiffardin Chart? No - copy requested   No - copy requested  No - copy requested   Would patient like  information on creating a medical advance directive?   No - Patient declined    No - Patient declined    Current Medications (verified) Outpatient Encounter Medications as of 11/29/2021  Medication Sig   atorvastatin (LIPITOR) 10 MG tablet TAKE 1 TABLET BY MOUTH ONCE WEEKLY   BD PEN NEEDLE NANO 2ND GEN 32G X 4 MM MISC USE AS DIRECTED   finasteride (PROSCAR) 5 MG tablet TAKE 1 TABLET BY MOUTH EVERY DAY   glipiZIDE (GLUCOTROL XL) 5 MG 24 hr tablet TAKE 1 TABLET BY MOUTH EVERY DAY   glucose blood (ONETOUCH VERIO) test strip USE TO TEST BLOOD SUGARS DAILY. DX: E11.9   HYDROcodone-acetaminophen (NORCO/VICODIN) 5-325 MG tablet Take 1-2 tablets by mouth every 6 (six) hours as needed.   insulin degludec (TRESIBA FLEXTOUCH) 100 UNIT/ML FlexTouch Pen Inject 0.2-0.3 mLs (20-30 Units total) into the skin daily. We will also sign for any needed supplies (Patient taking differently: Inject 16 Units into the skin. Inject 0.2-0.3 mLs (20-30 Units total) into the skin daily. We will also sign for any needed supplies)   losartan (COZAAR) 100 MG tablet TAKE 1 TABLET BY MOUTH  DAILY   Misc Natural Products (GLUCOSAMINE CHOND CMP ADVANCED PO) Take by mouth in the morning and at bedtime.   ONE TOUCH LANCETS MISC Use to test blood sugars. Dx:E11.9   tamsulosin (FLOMAX) 0.4 MG CAPS capsule TAKE 1 CAPSULE BY MOUTH  DAILY   timolol (TIMOPTIC)  0.5 % ophthalmic solution 1 drop 2 (two) times daily.   TRAVATAN Z 0.004 % SOLN ophthalmic solution Place 1 drop into the left eye at bedtime.    vitamin B-12 (CYANOCOBALAMIN) 1000 MCG tablet Take 1,000 mcg by mouth daily.   No facility-administered encounter medications on file as of 11/29/2021.    Allergies (verified) Patient has no known allergies.   History: Past Medical History:  Diagnosis Date   Acute bronchitis 09/12/2007   Qualifier: Diagnosis of  By: [name] MD, [name]    Diabetes mellitus type 2 with complications ([place]) 3893   Glaucoma    left eye   Gout     Hyperlipidemia    Hypertension    Leukemia, chronic ([place]) 2001   does not take treatment just has blood levels checked once a year   Past Surgical History:  Procedure Laterality Date   ANTERIOR LAT LUMBAR FUSION Left 01/03/2013   Procedure: ANTERIOR LATERAL LUMBAR FUSION lumbar three-four;  Surgeon: [name], MD;  Location: [facility];  Service: Neurosurgery;  Laterality: Left;   BACK SURGERY     lumbar fusion - 2016, feb   COLONOSCOPY W/ POLYPECTOMY     DENTAL SURGERY     implanted teeth x 2   [place] 1 LEVEL  01/03/2013   Procedure: LUMBAR PERCUTANEOUS PEDICLE SCREWS LUMBAR THREE-FOUR;  Surgeon: [name], MD;  Location: [facility];  Service: Neurosurgery;;   LUMBAR WOUND DEBRIDEMENT N/A 07/23/2014   Procedure: LUMBAR WOUND DEBRIDEMENT;  Surgeon: [name], MD;  Location: [facility];  Service: Neurosurgery;  Laterality: N/A;   TONSILLECTOMY     Family History  Problem Relation Age of Onset   Heart disease Mother        53, second hand   Heart disease Father        31, former smoker   Social History   Socioeconomic History   Marital status: Married    Spouse name: Not on file   Number of children: Not on file   Years of education: Not on file   Highest education level: Associate degree: occupational, Hotel manager, or vocational program  Occupational History   Occupation: Retired     Comment: -Land   Tobacco Use   Smoking status: Former    Packs/day: 0.25    Years: 20.00    Total pack years: 5.00    Types: Cigarettes    Quit date: 09/28/1995    Years since quitting: 26.1   Smokeless tobacco: Never  Vaping Use   Vaping Use: Never used  Substance and Sexual Activity   Alcohol use: Not Currently    Alcohol/week: 1.0 standard drink of alcohol    Types: 1 Glasses of wine per week    Comment: rarely   Drug use: No   Sexual activity: Yes  Other Topics Concern    Not on file  Social History Narrative   Remarried. 2017. Widowed 2009. 2 sons. 2 grandkids-boy/girl. No greatgrandkids.     Did everything for himself in past but back limiting him now      Retired from [employer]: golf stopped(hard with back), watch tv (pain with moving around)   Social Determinants of Health   Financial Resource Strain: Low Risk  (11/29/2021)   Overall Financial Resource Strain (CARDIA)    Difficulty of Paying Living Expenses: Not hard  at all  Food Insecurity: No Food Insecurity (11/29/2021)   Hunger Vital Sign    Worried About Running Out of Food in the Last Year: Never true    Ran Out of Food in the Last Year: Never true  Transportation Needs: No Transportation Needs (11/29/2021)   PRAPARE - Hydrologist (Medical): No    Lack of Transportation (Non-Medical): No  Physical Activity: Inactive (11/29/2021)   Exercise Vital Sign    Days of Exercise per Week: 0 days    Minutes of Exercise per Session: 0 min  Stress: No Stress Concern Present (11/29/2021)   [place]    Feeling of Stress : Not at all  Social Connections: Moderately Integrated (11/29/2021)   Social Connection and Isolation Panel [NHANES]    Frequency of Communication with Friends and Family: Rogers than three times a week    Frequency of Social Gatherings with Friends and Family: Rogers than three times a week    Attends Religious Services: Rogers than 4 times per year    Active Member of [employer] or Organizations: No    Attends Music therapist: Never    Marital Status: Married    Tobacco Counseling Counseling given: Not Answered   Clinical Intake:  Pre-visit preparation completed: Yes  Pain : No/denies pain     BMI - recorded: 33.82 Nutritional Status: BMI > 30  Obese Diabetes: Yes CBG done?: Yes (115 per pt stated) CBG resulted in Enter/ Edit results?: No Did pt. bring in  CBG monitor from home?: No  How often do you need to have someone help you when you read instructions, pamphlets, or other written materials from your doctor or pharmacy?: 1 - Never  Diabetic?Nutrition Risk Assessment:  Has the patient had any N/V/D within the last 2 months?  No  Does the patient have any non-healing wounds?  No  Has the patient had any unintentional weight loss or weight gain?  No   Diabetes:  Is the patient diabetic?  Yes  If diabetic, was a CBG obtained today?  Yes  Did the patient bring in their glucometer from home?  No  How often do you monitor your CBG's? daily.   Financial Strains and Diabetes Management:  Are you having any financial strains with the device, your supplies or your medication? No .  Does the patient want to be seen by Chronic Care Management for management of their diabetes?  No  Would the patient like to be referred to a Nutritionist or for Diabetic Management?  No   Diabetic Exams:  Diabetic Eye Exam: Overdue for diabetic eye exam. Pt has been advised about the importance in completing this exam. Patient advised to call and schedule an eye exam. Diabetic Foot Exam: Completed 09/16/21   Interpreter Needed?: No  Information entered by :: [name], LPN   Activities of Daily Living    11/29/2021    2:59 PM  In your present state of health, do you have any difficulty performing the following activities:  Hearing? 1  Comment slight HOH  Vision? 0  Difficulty concentrating or making decisions? 0  Walking or climbing stairs? 0  Dressing or bathing? 0  Doing errands, shopping? 0  Preparing Food and eating ? N  Using the Toilet? N  In the past six months, have you accidently leaked urine? N  Do you have problems with loss of bowel control? N  Managing your  Medications? N  Managing your Finances? N  Housekeeping or managing your Housekeeping? N    Patient Care Team: [name], MD as PCP - General (Family  Medicine) [name], MD as Consulting Physician (Neurosurgery) Ralene Bathe, MD as Consulting Physician (Ophthalmology) Wyatt Portela, MD as Consulting Physician (Oncology) Marlaine Hind, MD as Consulting Physician (Physical Medicine and Rehabilitation) Trula Slade, DPM as Consulting Physician (Podiatry) Edythe Clarity, [facility] (Pharmacist)  Indicate any recent Medical Services you may have received from other than [facility] providers in the past year (date may be approximate).     Assessment:   This is a routine wellness examination for [place].  Hearing/Vision screen Hearing Screening - Comments:: Pt stated slight HOH  Vision Screening - Comments:: Pt follows up with [facility] for annual eye exams   Dietary issues and exercise activities discussed: Current Exercise Habits: The patient does not participate in regular exercise at present   Goals Addressed             This Visit's Progress    Patient Stated       None at this time        Depression Screen    11/29/2021    2:57 PM 02/08/2021    2:46 PM 11/13/2020    2:33 PM 08/07/2020    3:08 PM 07/22/2019    9:38 AM 01/16/2019    3:26 PM 11/07/2017    1:45 PM  PHQ 2/9 Scores  PHQ - 2 Score 0 0 0 0 0 0 0  PHQ- 9 Score    0 0  0    Fall Risk    11/29/2021    2:59 PM 02/08/2021    2:45 PM 11/13/2020    2:35 PM 08/07/2020    3:08 PM 09/12/2019    8:37 AM  Fall Risk   Falls in the past year? 0 1 1 0 0  Number falls in past yr: 0 1 1 0 0  Injury with Fall? 0 1 1 0 0  Comment   skinned left knee    Risk for fall due to : Impaired vision;Impaired balance/gait Impaired balance/gait;Impaired mobility Impaired vision;Impaired balance/gait;Impaired mobility  Impaired balance/gait;Impaired mobility  Follow up Falls prevention discussed Falls evaluation completed Falls prevention discussed  Education provided;Falls evaluation completed;Falls prevention discussed    FALL RISK PREVENTION PERTAINING TO THE HOME:  Any  stairs in or around the home? Yes  If so, are there any without handrails? No  Home free of loose throw rugs in walkways, pet beds, electrical cords, etc? Yes  Adequate lighting in your home to reduce risk of falls? Yes   ASSISTIVE DEVICES UTILIZED TO PREVENT FALLS:  Life alert? No  Use of a cane, walker or w/c? Yes  Grab bars in the bathroom? Yes  Shower chair or bench in shower? Yes  Elevated toilet seat or a handicapped toilet? No   TIMED UP AND GO:  Was the test performed? No .   Cognitive Function:        11/29/2021    3:01 PM 11/13/2020    2:37 PM 09/12/2019    8:37 AM 01/16/2019    3:27 PM  6CIT Screen  What Year? 0 points 4 points 0 points 0 points  What month? 0 points 0 points 0 points 0 points  What time? 0 points 0 points 0 points 0 points  Count back from 20 0 points 0 points 0 points 0 points  Months in reverse  0 points 0 points 0 points 0 points  Repeat phrase 6 points 0 points 0 points 0 points  Total Score 6 points 4 points 0 points 0 points    Immunizations Immunization History  Administered Date(s) Administered   Fluad Quad(high Dose 65+) 01/02/2019, 02/27/2020, 02/08/2021   Influenza Split 02/10/2011, 01/11/2012   Influenza Whole 03/15/2007, 01/31/2008, 02/04/2009, 02/01/2010   Influenza, High Dose Seasonal PF 01/18/2016, 01/24/2017, 01/29/2018   Influenza,inj,Quad PF,6+ Mos 02/12/2014, 01/13/2015   Moderna Sars-Covid-2 Vaccination 05/24/2019, 06/10/2019, 02/29/2020, 08/06/2020   Pneumococcal Conjugate-13 03/23/2015   Pneumococcal Polysaccharide-23 03/24/2009, 02/12/2014   Td 03/09/2007   Tdap 09/19/2017    TDAP status: Up to date  Flu Vaccine status: Up to date  Pneumococcal vaccine status: Up to date  Covid-19 vaccine status: Completed vaccines  Qualifies for Shingles Vaccine? Yes   Zostavax completed No   Shingrix Completed?: No.    Education has been provided regarding the importance of this vaccine. Patient has been advised to call  insurance company to determine out of pocket expense if they have not yet received this vaccine. Advised may also receive vaccine at local pharmacy or Health Dept. Verbalized acceptance and understanding.  Screening Tests Health Maintenance  Topic Date Due   COVID-19 Vaccine (5 - Booster for Moderna series) 10/01/2020   OPHTHALMOLOGY EXAM  10/28/2021   INFLUENZA VACCINE  11/30/2021   HEMOGLOBIN A1C  03/19/2022   FOOT EXAM  09/17/2022   TETANUS/TDAP  09/20/2027   Pneumonia Vaccine 17+ Years old  Completed   HPV VACCINES  Aged Out   Zoster Vaccines- Shingrix  Discontinued    Health Maintenance  Health Maintenance Due  Topic Date Due   COVID-19 Vaccine (5 - Booster for Moderna series) 10/01/2020   OPHTHALMOLOGY EXAM  10/28/2021    Colorectal cancer screening: No longer required.    Additional Screening:  Vision Screening: Recommended annual ophthalmology exams for early detection of glaucoma and other disorders of the eye. Is the patient up to date with their annual eye exam?  Yes  Who is the provider or what is the name of the office in which the patient attends annual eye exams? Dr Satira Sark  If pt is not established with a provider, would they like to be referred to a provider to establish care? No .   Dental Screening: Recommended annual dental exams for proper oral hygiene  Community Resource Referral / Chronic Care Management: CRR required this visit?  No   CCM required this visit?  No      Plan:     I have personally reviewed and noted the following in the patient's chart:   Medical and social history Use of alcohol, tobacco or illicit drugs  Current medications and supplements including opioid prescriptions. Patient is currently taking opioid prescriptions. Information provided to patient regarding non-opioid alternatives. Patient advised to discuss non-opioid treatment plan with their provider. Functional ability and status Nutritional status Physical  activity Advanced directives List of other physicians Hospitalizations, surgeries, and ER visits in previous 12 months Vitals Screenings to include cognitive, depression, and falls Referrals and appointments  In addition, I have reviewed and discussed with patient certain preventive protocols, quality metrics, and best practice recommendations. A written personalized care plan for preventive services as well as general preventive health recommendations were provided to patient.     Willette Brace, LPN   4/33/2951   Nurse Notes: none

## 2021-12-28 ENCOUNTER — Other Ambulatory Visit: Payer: Self-pay | Admitting: Family Medicine

## 2021-12-28 DIAGNOSIS — E1122 Type 2 diabetes mellitus with diabetic chronic kidney disease: Secondary | ICD-10-CM

## 2022-01-24 ENCOUNTER — Encounter: Payer: Self-pay | Admitting: *Deleted

## 2022-01-24 DIAGNOSIS — M7918 Myalgia, other site: Secondary | ICD-10-CM | POA: Diagnosis not present

## 2022-01-24 DIAGNOSIS — M48061 Spinal stenosis, lumbar region without neurogenic claudication: Secondary | ICD-10-CM | POA: Diagnosis not present

## 2022-02-02 ENCOUNTER — Telehealth: Payer: Self-pay | Admitting: Pharmacist

## 2022-02-02 NOTE — Progress Notes (Signed)
Chronic Care Management Pharmacy Assistant   Name: Kevin Rogers  MRN: [id number] DOB: 1931/09/29   Reason for Encounter: Diabetes Adherence Call    Recent office visits:  None  Recent consult visits:  None  Hospital visits:  None in previous 6 months  Medications: Outpatient Encounter Medications as of 02/02/2022  Medication Sig Note   atorvastatin (LIPITOR) 10 MG tablet TAKE 1 TABLET BY MOUTH ONCE WEEKLY    BD PEN NEEDLE NANO 2ND GEN 32G X 4 MM MISC USE AS DIRECTED    finasteride (PROSCAR) 5 MG tablet TAKE 1 TABLET BY MOUTH EVERY DAY    glipiZIDE (GLUCOTROL XL) 5 MG 24 hr tablet TAKE 1 TABLET BY MOUTH EVERY DAY    HYDROcodone-acetaminophen (NORCO/VICODIN) 5-325 MG tablet Take 1-2 tablets by mouth every 6 (six) hours as needed.    insulin degludec (TRESIBA FLEXTOUCH) 100 UNIT/ML FlexTouch Pen INJECT 0.2-0.3 MLS (20-30 UNITS TOTAL) INTO THE SKIN DAILY.    losartan (COZAAR) 100 MG tablet TAKE 1 TABLET BY MOUTH  DAILY    Misc Natural Products (GLUCOSAMINE CHOND CMP ADVANCED PO) Take by mouth in the morning and at bedtime.    ONE TOUCH LANCETS MISC Use to test blood sugars. Dx:E11.9    ONETOUCH VERIO test strip USE TO TEST BLOOD SUGARS DAILY. DX: E11.9    tamsulosin (FLOMAX) 0.4 MG CAPS capsule TAKE 1 CAPSULE BY MOUTH  DAILY    timolol (TIMOPTIC) 0.5 % ophthalmic solution 1 drop 2 (two) times daily.    TRAVATAN Z 0.004 % SOLN ophthalmic solution Place 1 drop into the left eye at bedtime.  12/03/2014: Received from: External Pharmacy   vitamin B-12 (CYANOCOBALAMIN) 1000 MCG tablet Take 1,000 mcg by mouth daily.    No facility-administered encounter medications on file as of 02/02/2022.   Recent Relevant Labs: Lab Results  Component Value Date/Time   HGBA1C 6.3 09/16/2021 10:40 AM   HGBA1C 6.8 (H) 02/08/2021 03:30 PM   MICROALBUR 1.3 07/08/2015 08:18 AM   MICROALBUR 1.2 07/23/2012 08:17 AM    Kidney Function Lab Results  Component Value Date/Time   CREATININE 1.59 (H)  09/16/2021 10:40 AM   CREATININE 1.60 (H) 02/08/2021 03:30 PM   CREATININE 1.66 (H) 02/05/2021 09:28 AM   CREATININE 1.54 (H) 08/07/2020 04:01 PM   CREATININE 1.43 (H) 03/16/2020 04:30 PM   CREATININE 1.3 06/09/2016 09:34 AM   CREATININE 1.5 (H) 12/08/2015 08:29 AM   GFR 38.78 (L) 09/16/2021 10:40 AM   GFRNONAA 40 (L) 02/05/2021 09:28 AM   GFRNONAA 40 (L) 08/07/2020 04:01 PM   GFRAA 47 (L) 08/07/2020 04:01 PM    Current antihyperglycemic regimen:  Glipizide 5 mg daily Tresiba 20-30 units daily  What recent interventions/DTPs have been made to improve glycemic control:  No recent interventions or DTPs.  Have there been any recent hospitalizations or ED visits since last visit with CPP? No  Patient denies hypoglycemic symptoms.  Patient denies hyperglycemic symptoms.  How often are you checking your blood sugar? once daily  What are your blood sugars ranging?  Fasting: 109  During the week, how often does your blood glucose drop below 70? Never  Are you checking your feet daily/regularly? Yes  Adherence Review: Is the patient currently on a STATIN medication? Yes Is the patient currently on ACE/ARB medication? Yes Does the patient have >5 day gap between last estimated fill dates? No   Care Gaps: Medicare Annual Wellness: Completed 11/29/2021 Ophthalmology Exam: Next due on 10/28/2021 Foot Exam: Next  due on 09/17/2022 Hemoglobin A1C: 6.3% on 09/16/2021 Colonoscopy: Completed 04/09/2007  Future Appointments  Date Time Provider [place]  02/04/2022 10:00 AM [facility] None  02/04/2022 10:30 AM Wyatt Portela, MD [facility] None  03/29/2022  9:00 AM [name], MD [facility]  04/18/2022  2:00 PM [facility] PHARMACIST [facility]   Star Rating Drugs: Atorvastatin 10 mg last filled 09/24/2021 90 DS Glipizide 5 mg last filled 07/19/2021 90 DS Losartan 100 mg last filled 10/14/2021 90 DS  April D Calhoun, [place] Pharmacist  Assistant [phone]

## 2022-02-04 ENCOUNTER — Inpatient Hospital Stay: Payer: Medicare Other | Attending: Oncology

## 2022-02-04 ENCOUNTER — Other Ambulatory Visit: Payer: Self-pay

## 2022-02-04 ENCOUNTER — Inpatient Hospital Stay: Payer: Medicare Other | Admitting: Oncology

## 2022-02-04 VITALS — BP 152/67 | HR 31 | Temp 97.9°F | Wt 241.6 lb

## 2022-02-04 DIAGNOSIS — R001 Bradycardia, unspecified: Secondary | ICD-10-CM | POA: Insufficient documentation

## 2022-02-04 DIAGNOSIS — C9110 Chronic lymphocytic leukemia of B-cell type not having achieved remission: Secondary | ICD-10-CM

## 2022-02-04 LAB — CMP (CANCER CENTER ONLY)
ALT: 20 U/L (ref 0–44)
AST: 12 U/L — ABNORMAL LOW (ref 15–41)
Albumin: 4 g/dL (ref 3.5–5.0)
Alkaline Phosphatase: 76 U/L (ref 38–126)
Anion gap: 4 — ABNORMAL LOW (ref 5–15)
BUN: 31 mg/dL — ABNORMAL HIGH (ref 8–23)
CO2: 28 mmol/L (ref 22–32)
Calcium: 9.4 mg/dL (ref 8.9–10.3)
Chloride: 110 mmol/L (ref 98–111)
Creatinine: 1.51 mg/dL — ABNORMAL HIGH (ref 0.61–1.24)
GFR, Estimated: 44 mL/min — ABNORMAL LOW (ref >60)
Glucose, Bld: 149 mg/dL — ABNORMAL HIGH (ref 70–99)
Potassium: 5 mmol/L (ref 3.5–5.1)
Sodium: 142 mmol/L (ref 135–145)
Total Bilirubin: 1 mg/dL (ref 0.3–1.2)
Total Protein: 6.7 g/dL (ref 6.5–8.1)

## 2022-02-04 LAB — CBC WITH DIFFERENTIAL (CANCER CENTER ONLY)
Abs Immature Granulocytes: 0.03 10*3/uL (ref 0.00–0.07)
Basophils Absolute: 0 10*3/uL (ref 0.0–0.1)
Basophils Relative: 0 %
Eosinophils Absolute: 0.1 10*3/uL (ref 0.0–0.5)
Eosinophils Relative: 1 %
HCT: 39.7 % (ref 39.0–52.0)
Hemoglobin: 13.2 g/dL (ref 13.0–17.0)
Immature Granulocytes: 0 %
Lymphocytes Relative: 68 %
Lymphs Abs: 9.9 10*3/uL — ABNORMAL HIGH (ref 0.7–4.0)
MCH: 29.6 pg (ref 26.0–34.0)
MCHC: 33.2 g/dL (ref 30.0–36.0)
MCV: 89 fL (ref 80.0–100.0)
Monocytes Absolute: 0.3 10*3/uL (ref 0.1–1.0)
Monocytes Relative: 2 %
Neutro Abs: 4.2 10*3/uL (ref 1.7–7.7)
Neutrophils Relative %: 29 %
Platelet Count: 181 10*3/uL (ref 150–400)
RBC: 4.46 MIL/uL (ref 4.22–5.81)
RDW: 13.5 % (ref 11.5–15.5)
WBC Count: 14.6 10*3/uL — ABNORMAL HIGH (ref 4.0–10.5)
nRBC: 0 % (ref 0.0–0.2)

## 2022-02-04 NOTE — Progress Notes (Signed)
Kevin Rogers ONCOLOGY OFFICE PROGRESS NOTE 02/04/22   DIAGNOSIS: 88 year old man with CLL diagnosed in 2007.  He has not required any treatment presenting with presented with lymphocytosis only.   CURRENT THERAPY: Active surveillance.  INTERVAL HISTORY:  Kevin Rogers returns today for a follow-up.  Since the last visit, he reports no major complaints at this time.  He denies any nausea vomiting or abdominal pain.  He denies any lymphadenopathy or constitutional symptoms.  He denies any dizziness, lightheadedness or syncopal episodes.       PHYSICAL EXAMINATION:  Blood pressure (!) 152/67, pulse (!) 31, temperature 97.9 F (36.6 C), weight 241 lb 9.6 oz (109.6 kg), SpO2 100 %.    ECOG 1    General appearance: Comfortable appearing without any discomfort Head: Normocephalic without any trauma Oropharynx: Mucous membranes are moist and pink without any thrush or ulcers. Eyes: Pupils are equal and round reactive to light. Lymph nodes: No cervical, supraclavicular, inguinal or axillary lymphadenopathy.   Heart:regular rate and rhythm.  S1 and S2 without leg edema. Lung: Clear without any rhonchi or wheezes.  No dullness to percussion. Abdomin: Soft, nontender, nondistended with good bowel sounds.  No hepatosplenomegaly. Musculoskeletal: No joint deformity or effusion.  Full range of motion noted. Neurological: No deficits noted on motor, sensory and deep tendon reflex exam. Skin: No petechial rash or dryness.  Appeared moist.       CBC    Component Value Date/Time   WBC 14.6 (H) 02/04/2022 0953   WBC 9.4 09/16/2021 1040   RBC 4.46 02/04/2022 0953   HGB 13.2 02/04/2022 0953   HGB 13.3 02/09/2017 0851   HCT 39.7 02/04/2022 0953   HCT 39.8 02/09/2017 0851   PLT 181 02/04/2022 0953   PLT 144 02/09/2017 0851   MCV 89.0 02/04/2022 0953   MCV 89.4 02/09/2017 0851   MCH 29.6 02/04/2022 0953   MCHC 33.2 02/04/2022 0953   RDW 13.5 02/04/2022 0953   RDW 13.5  02/09/2017 0851   LYMPHSABS PENDING 02/04/2022 0953   LYMPHSABS 10.5 (H) 02/09/2017 0851   MONOABS PENDING 02/04/2022 0953   MONOABS 0.2 02/09/2017 0851   EOSABS PENDING 02/04/2022 0953   EOSABS 0.1 02/09/2017 0851   BASOSABS PENDING 02/04/2022 0953   BASOSABS 0.0 02/09/2017 0851       Assessment and plan:  88 year old man with:   1.  CLL diagnosed in 2007 with lymphocytosis indicating stage presented with lymphocytosis and stage 0 disease without any indication for treatment.  CBC from today reviewed and showed a white cell count of 14.6 which is close to his baseline from October 2022.  He has normal hemoglobin and platelet count and continues to be asymptomatic from this condition.  The natural course of this disease and treatment options and indication were reiterated.  At this time, I recommended continued surveillance and initiate treatment if he develops rapid rise in his white cell count, painful adenopathy, cytopenias among other indications.   2.  Bradycardia: His heart rate fluctuates in the 50s with normal rhythm and asymptomatic.  Recommended follow-up with his primary care regarding these findings.  3.  Follow-up: In 1 year for a follow-up visit.  20  minutes were spent on this encounter.  Time was dedicated to reviewing laboratory data, disease status update and future plan of care review.    Zola Button MD 02/04/22

## 2022-02-09 ENCOUNTER — Ambulatory Visit (INDEPENDENT_AMBULATORY_CARE_PROVIDER_SITE_OTHER): Payer: Medicare Other

## 2022-02-09 DIAGNOSIS — Z23 Encounter for immunization: Secondary | ICD-10-CM | POA: Diagnosis not present

## 2022-02-11 ENCOUNTER — Other Ambulatory Visit: Payer: Self-pay | Admitting: Family Medicine

## 2022-03-29 ENCOUNTER — Encounter: Payer: Self-pay | Admitting: Family Medicine

## 2022-03-29 ENCOUNTER — Ambulatory Visit (INDEPENDENT_AMBULATORY_CARE_PROVIDER_SITE_OTHER): Payer: Medicare Other | Admitting: Family Medicine

## 2022-03-29 VITALS — BP 118/60 | HR 47 | Temp 98.0°F | Ht 72.0 in | Wt 241.6 lb

## 2022-03-29 DIAGNOSIS — E538 Deficiency of other specified B group vitamins: Secondary | ICD-10-CM | POA: Diagnosis not present

## 2022-03-29 DIAGNOSIS — E1122 Type 2 diabetes mellitus with diabetic chronic kidney disease: Secondary | ICD-10-CM

## 2022-03-29 DIAGNOSIS — M48061 Spinal stenosis, lumbar region without neurogenic claudication: Secondary | ICD-10-CM

## 2022-03-29 DIAGNOSIS — N183 Chronic kidney disease, stage 3 unspecified: Secondary | ICD-10-CM

## 2022-03-29 DIAGNOSIS — E785 Hyperlipidemia, unspecified: Secondary | ICD-10-CM

## 2022-03-29 DIAGNOSIS — N182 Chronic kidney disease, stage 2 (mild): Secondary | ICD-10-CM

## 2022-03-29 DIAGNOSIS — Z0000 Encounter for general adult medical examination without abnormal findings: Secondary | ICD-10-CM | POA: Diagnosis not present

## 2022-03-29 DIAGNOSIS — I10 Essential (primary) hypertension: Secondary | ICD-10-CM

## 2022-03-29 LAB — URINALYSIS, ROUTINE W REFLEX MICROSCOPIC
Bilirubin Urine: NEGATIVE
Hgb urine dipstick: NEGATIVE
Ketones, ur: NEGATIVE
Leukocytes,Ua: NEGATIVE
Nitrite: NEGATIVE
RBC / HPF: NONE SEEN (ref 0–?)
Specific Gravity, Urine: 1.015 (ref 1.000–1.030)
Total Protein, Urine: NEGATIVE
Urine Glucose: NEGATIVE
Urobilinogen, UA: 0.2 (ref 0.0–1.0)
pH: 5.5 (ref 5.0–8.0)

## 2022-03-29 LAB — COMPREHENSIVE METABOLIC PANEL
ALT: 17 U/L (ref 0–53)
AST: 12 U/L (ref 0–37)
Albumin: 4 g/dL (ref 3.5–5.2)
Alkaline Phosphatase: 72 U/L (ref 39–117)
BUN: 28 mg/dL — ABNORMAL HIGH (ref 6–23)
CO2: 27 mEq/L (ref 19–32)
Calcium: 9.5 mg/dL (ref 8.4–10.5)
Chloride: 112 mEq/L (ref 96–112)
Creatinine, Ser: 1.46 mg/dL (ref 0.40–1.50)
GFR: 42.8 mL/min — ABNORMAL LOW (ref >60.00)
Glucose, Bld: 121 mg/dL — ABNORMAL HIGH (ref 70–99)
Potassium: 5 mEq/L (ref 3.5–5.1)
Sodium: 146 mEq/L — ABNORMAL HIGH (ref 135–145)
Total Bilirubin: 0.7 mg/dL (ref 0.2–1.2)
Total Protein: 6.1 g/dL (ref 6.0–8.3)

## 2022-03-29 LAB — CBC WITH DIFFERENTIAL/PLATELET
Basophils Absolute: 0 10*3/uL (ref 0.0–0.1)
Basophils Relative: 0.2 % (ref 0.0–3.0)
Eosinophils Absolute: 0.1 10*3/uL (ref 0.0–0.7)
Eosinophils Relative: 0.8 % (ref 0.0–5.0)
HCT: 40.1 % (ref 39.0–52.0)
Hemoglobin: 12.9 g/dL — ABNORMAL LOW (ref 13.0–17.0)
Lymphocytes Relative: 65.2 % — ABNORMAL HIGH (ref 12.0–46.0)
Lymphs Abs: 7.8 10*3/uL — ABNORMAL HIGH (ref 0.7–4.0)
MCHC: 32.2 g/dL (ref 30.0–36.0)
MCV: 92 fl (ref 78.0–100.0)
Monocytes Absolute: 0.4 10*3/uL (ref 0.1–1.0)
Monocytes Relative: 3.1 % (ref 3.0–12.0)
Neutro Abs: 3.7 10*3/uL (ref 1.4–7.7)
Neutrophils Relative %: 30.7 % — ABNORMAL LOW (ref 43.0–77.0)
Platelets: 165 10*3/uL (ref 150.0–400.0)
RBC: 4.35 Mil/uL (ref 4.22–5.81)
RDW: 14.5 % (ref 11.5–15.5)
WBC: 11.9 10*3/uL — ABNORMAL HIGH (ref 4.0–10.5)

## 2022-03-29 LAB — LIPID PANEL
Cholesterol: 161 mg/dL (ref 0–200)
HDL: 55.2 mg/dL (ref >39.00)
LDL Cholesterol: 95 mg/dL (ref 0–99)
NonHDL: 105.9
Total CHOL/HDL Ratio: 3
Triglycerides: 56 mg/dL (ref 0.0–149.0)
VLDL: 11.2 mg/dL (ref 0.0–40.0)

## 2022-03-29 LAB — HEMOGLOBIN A1C: Hgb A1c MFr Bld: 7 % — ABNORMAL HIGH (ref 4.6–6.5)

## 2022-03-29 NOTE — Progress Notes (Signed)
Phone: [phone]   Subjective:  Patient presents today for their annual physical. Chief complaint-noted.   See problem oriented charting- ROS- full  review of systems was completed and negative  except for: some numbness/tingling in legs but also feet, hearing loss per wife - tested over a year ago though, post nasal drip, runny nose after eating, back pain, muscle aches, some eye itching and running at times  The following were reviewed and entered/updated in epic: Past Medical History:  Diagnosis Date   Acute bronchitis 09/12/2007   Qualifier: Diagnosis of  By: [name] MD, John E    Diabetes mellitus type 2 with complications ([place]) 5462   Glaucoma    left eye   Gout    Hyperlipidemia    Hypertension    Leukemia, chronic ([place]) 2001   does not take treatment just has blood levels checked once a year   Patient Active Problem List   Diagnosis Date Noted   Chronic lymphocytic leukemia ([place]) 03/08/2007    Priority: High   Diabetes mellitus type II, controlled ([place]) 03/08/2007    Priority: High   History of osteomyelitis of lumbar vertebrae     Priority: Medium    Hyperlipidemia     Priority: Medium    CKD (chronic kidney disease), stage III ([place]) 01/13/2015    Priority: Medium    Lumbar spinal stenosis 06/26/2014    Priority: Medium    BPH (benign prostatic hyperplasia) 06/24/2014    Priority: Medium    Gout 03/08/2007    Priority: Medium    Essential hypertension 03/08/2007    Priority: Medium    Enteritis due to Clostridium difficile     Priority: Low   Discitis of lumbar region     Priority: Low   B12 deficiency 12/12/2008    Priority: Low   Diabetic polyneuropathy ([place]) 12/12/2008    Priority: Low   ERECTILE DYSFUNCTION 03/18/2008    Priority: Low   Glaucoma 03/08/2007    Priority: Low   Osteoarthritis 03/08/2007    Priority: Low   Past Surgical History:  Procedure Laterality Date   ANTERIOR LAT LUMBAR FUSION Left 01/03/2013   Procedure: ANTERIOR LATERAL  LUMBAR FUSION lumbar three-four;  Surgeon: [name], MD;  Location: [facility];  Service: Neurosurgery;  Laterality: Left;   BACK SURGERY     lumbar fusion - 2016, feb   COLONOSCOPY W/ POLYPECTOMY     DENTAL SURGERY     implanted teeth x 2   Reader 1 LEVEL  01/03/2013   Procedure: LUMBAR PERCUTANEOUS PEDICLE SCREWS LUMBAR THREE-FOUR;  Surgeon: [name], MD;  Location: [facility];  Service: Neurosurgery;;   LUMBAR WOUND DEBRIDEMENT N/A 07/23/2014   Procedure: LUMBAR WOUND DEBRIDEMENT;  Surgeon: [name], MD;  Location: [facility];  Service: Neurosurgery;  Laterality: N/A;   TONSILLECTOMY      Family History  Problem Relation Age of Onset   Heart disease Mother        30, second hand   Heart disease Father        31, former smoker    Medications- reviewed and updated Current Outpatient Medications  Medication Sig Dispense Refill   atorvastatin (LIPITOR) 10 MG tablet TAKE 1 TABLET BY MOUTH WEEKLY 13 tablet 3   BD PEN NEEDLE NANO 2ND GEN 32G X 4 MM MISC USE AS DIRECTED 100 each 1   finasteride (PROSCAR)  5 MG tablet TAKE 1 TABLET BY MOUTH EVERY DAY 90 tablet 3   glipiZIDE (GLUCOTROL XL) 5 MG 24 hr tablet TAKE 1 TABLET BY MOUTH EVERY DAY 90 tablet 1   HYDROcodone-acetaminophen (NORCO/VICODIN) 5-325 MG tablet Take 1-2 tablets by mouth every 6 (six) hours as needed.     insulin degludec (TRESIBA FLEXTOUCH) 100 UNIT/ML FlexTouch Pen INJECT 0.2-0.3 MLS (20-30 UNITS TOTAL) INTO THE SKIN DAILY. 3 mL 3   losartan (COZAAR) 100 MG tablet TAKE 1 TABLET BY MOUTH  DAILY 90 tablet 3   Misc Natural Products (GLUCOSAMINE CHOND CMP ADVANCED PO) Take by mouth in the morning and at bedtime.     ONE TOUCH LANCETS MISC Use to test blood sugars. Dx:E11.9 100 each 11   ONETOUCH VERIO test strip USE TO TEST BLOOD SUGARS DAILY. DX: E11.9 100 strip 12   tamsulosin (FLOMAX) 0.4 MG CAPS capsule TAKE 1 CAPSULE BY  MOUTH  DAILY 90 capsule 3   timolol (TIMOPTIC) 0.5 % ophthalmic solution 1 drop 2 (two) times daily.     TRAVATAN Z 0.004 % SOLN ophthalmic solution Place 1 drop into the left eye at bedtime.      vitamin B-12 (CYANOCOBALAMIN) 1000 MCG tablet Take 1,000 mcg by mouth daily.     No current facility-administered medications for this visit.    Allergies-reviewed and updated No Known Allergies  Social History   Social History Narrative   Remarried. 2017. Widowed 2009. 2 sons. 2 grandkids-boy/girl. No greatgrandkids.     Did everything for himself in past but back limiting him now      Retired from [employer]: golf stopped(hard with back), watch tv (pain with moving around)   Objective  Objective:  BP 118/60 (BP Location: Left Arm, Patient Position: Sitting)   Pulse (!) 47   Temp 98 F (36.7 C) (Temporal)   Ht 6' (1.829 m)   Wt 241 lb 9.6 oz (109.6 kg)   SpO2 95%   BMI 32.77 kg/m  Gen: NAD, resting comfortably HEENT: Mucous membranes are moist. Oropharynx normal Neck: no thyromegaly CV: RRR no murmurs rubs or gallops Lungs: CTAB no crackles, wheeze, rhonchi Abdomen: soft/nontender/nondistended/normal bowel sounds. No rebound or guarding.  Ext: no edema Skin: warm, dry Neuro: grossly normal, moves all extremities, PERRLA Poor mobility due to back pain but was able to get onto tabl ewithout assist   Assessment and Plan  88 y.o. male presenting for annual physical.  Health Maintenance counseling: 1. Anticipatory guidance: Patient counseled regarding regular dental exams q6 months, eye exams  twice yearly with glaucoma,  avoiding smoking and second hand smoke , limiting alcohol to 2 beverages per day -doesn't drink, no illicit drugs .   2. Risk factor reduction:  Advised patient of need for regular exercise and diet rich and fruits and vegetables to reduce risk of heart attack and stroke.  Exercise- limited by his back- discussed chair exercises- or water walking.   Diet/weight management-10 lbs down! Congratulated efforts.  Wt Readings from Last 3 Encounters:  03/29/22 241 lb 9.6 oz (109.6 kg)  02/04/22 241 lb 9.6 oz (109.6 kg)  09/16/21 249 lb 6.4 oz (113.1 kg)  3. Immunizations/screenings/ancillary studies- up to date Immunization History  Administered Date(s) Administered   Fluad Quad(high Dose 65+) 01/02/2019, 02/27/2020, 02/08/2021, 02/09/2022   Influenza Split 02/10/2011, 01/11/2012   Influenza Whole 03/15/2007, 01/31/2008, 02/04/2009, 02/01/2010   Influenza, High Dose Seasonal PF 01/18/2016, 01/24/2017, 01/29/2018   Influenza,inj,Quad  PF,6+ Mos 02/12/2014, 01/13/2015   Moderna Covid-19 Vaccine Bivalent Booster 82yr & up 02/09/2022   Moderna Sars-Covid-2 Vaccination 05/24/2019, 06/10/2019, 02/29/2020, 08/06/2020   Pneumococcal Conjugate-13 03/23/2015   Pneumococcal Polysaccharide-23 03/24/2009, 02/12/2014   Td 03/09/2007   Tdap 09/19/2017  4. Prostate cancer screening-passed age based screening recommendations  - overall trend was low risk for prostate cancer   5. Colon cancer screening - - passed age based screening recommendations. No blood in stool .   6. Skin cancer screening- lower risk due to melanin content . advised regular sunscreen use. Denies worrisome, changing, or new skin lesions.  7. Smoking associated screening (lung cancer screening, AAA screen 65-75, UA)- Former smoker- quit in 1990s- no regular screening other than wants ua 8. STD screening - not indicated only active with wife    Problem oriented charting  #hypertension/CKD stage III S: medication: Losartan 100 mg daily  GFR typically in the 50s-previously on meloxicam but stopped in 2017 A/P: Hypertension-controlled. Continue current medications. CKD stage III-hopefuly stable or improved- update  cmp with labs today. Continue current meds for now.  # Diabetes S: Medication: tresiba  14 units a day.  Taking Glipizide 5 mg extended release-pharmacy team had  suggested considering Ozempic and stopping glipizide  CBGs- 90 day average 116 varied times of days, no lows Exercise and diet- No exercise due to immobility Lab Results  Component Value Date   HGBA1C 6.3 09/16/2021   HGBA1C 6.8 (H) 02/08/2021   HGBA1C 6.6 (H) 08/07/2020   A/P: suspect very good control- if a1c gets down to 6 or so may stop glipizide but no lows  Patient also likely with diabetic polyneuropathy-tingling in the feet and shooting pains-also may be related to his back pain/lumbar stenosis. Stable on no specific Rx - also notes some into his fingers worse on left hand- not sure if spares pinky. Tinel and phalen negative today- likely neuropathy related  #hyperlipidemia S: Medication:Atorvastatin 10 mg once a week. Past primary prevention age range-we are trying to target LDL at least around 100 Lab Results  Component Value Date   CHOL 151 02/08/2021   HDL 58.60 02/08/2021   LDLCALC 76 02/08/2021   LDLDIRECT 76.0 11/23/2017   TRIG 82.0 02/08/2021   CHOLHDL 3 02/08/2021   A/P: Hopefuly stable or improved- update LDL with labs today. Continue current meds for now.   #CLL-continues yearly visits with oncology- last 02/04/22. Diagnosed in 2000. We will check CBC with differential today to make sure stable.   #Lumbar spinal stenosis S: Limits mobility-chronic back pain. Currently taking hydrocodone through [facility]- prior Dr. BBrien FewA/P: ongoing pain but stable- continue q 3 month visits/checks- needs shots usally   # B12 deficiency S: Current treatment/medication (oral vs. IM): Oral 1000 mcg Lab Results  Component Value Date   VITAMINB12 >1504 (H) 09/16/2021  A/P:  Controlled. Continue current medications. Check at least q12-18 months as long as on meds  # BPH S:medication: finasteride 5 mg   , tamsulosin 0.5 mg   A/P: tolerable. Continue current medications.    Recommended follow up: Return in about 6 months (around 09/27/2022) for followup or  sooner if needed.Schedule b4 you leave. Future Appointments  Date Time Provider [place] 04/18/2022  2:00 PM [facility] CCM PHARMACIST [facility]EC  02/07/2023  1:00 PM [facility] LAB [facility] None  02/07/2023  1:30 PM Shadad, FMathis Dad MD [facility] None   Lab/Order associations: fasting   ICD-10-CM   1. Preventative health care  Z00.00     2. Controlled type 2 diabetes mellitus with stage 2 chronic kidney disease, without long-term current use of insulin (HCC)  E11.22 CBC with Differential/Platelet   N18.2 Comprehensive metabolic panel    Lipid panel    Hemoglobin A1c    3. Essential hypertension  I10 CBC with Differential/Platelet    Comprehensive metabolic panel    Urinalysis, Routine w reflex microscopic    4. Spinal stenosis of lumbar region, unspecified whether neurogenic claudication present  M48.061     5. Stage 3 chronic kidney disease, unspecified whether stage 3a or 3b CKD (HCC)  N18.30 CBC with Differential/Platelet    Comprehensive metabolic panel    6. Hyperlipidemia, unspecified hyperlipidemia type  E78.5 CBC with Differential/Platelet    Comprehensive metabolic panel    Lipid panel    7. B12 deficiency  E53.8       No orders of the defined types were placed in this encounter.   Return precautions advised.  Garret Reddish, MD

## 2022-03-29 NOTE — Patient Instructions (Addendum)
Please stop by lab before you go If you have mychart- we will send your results within 3 business days of [place] receiving them.  If you do not have mychart- we will call you about results within 5 business days of [place] receiving them.  *please also note that you will see labs on mychart as soon as they post. I will later go in and write notes on them- will say "notes from Dr. Yong Channel"   Recommended follow up: Return in about 6 months (around 09/27/2022) for followup or sooner if needed.Schedule b4 you leave.

## 2022-04-05 ENCOUNTER — Other Ambulatory Visit: Payer: Self-pay | Admitting: Family Medicine

## 2022-04-07 NOTE — Progress Notes (Signed)
Chronic Care Management Pharmacy Note  04/18/2022 Name:  Kevin Rogers MRN:  [id number] DOB:  23-Nov-1931   Summary: PharmD Follow up visit.  Discussed patient A1c of 7.0, would be ok with even slightly higher due to age.  He is not having any hypoglycemia with his current regimen.  Is getting steroid injections q3 months causing some sugar spikes.  Prefers to leave his meds alone.  Main concern would by for hypoglycemia but he is eating consistently.  Recommendations: -Previous discussion about adding GLP-1 such as Ozempic to replace glipizide.  Patient prefers to keep things unchanged.  In the future could consider d/c insulin AND glipizide to see if he can be managed on just Ozempic.  Would reduce hypo risk as well as provide CV benefit and weight loss. Patient to follow up with PCP in 6 months for A1c recheck.  FU CMA in 30 days PharmD 6 months Subjective: Kevin Rogers is an 88 y.o. year old male who is a primary patient of Hunter, Brayton Mars, MD.  The CCM team was consulted for assistance with disease management and care coordination needs.    Engaged with patient by telephone for follow up visit in response to provider referral for pharmacy case management and/or care coordination services.   Consent to Services:  The patient was given information about Chronic Care Management services, agreed to services, and gave verbal consent prior to initiation of services.  Please see initial visit note for detailed documentation.   Patient Care Team: [name], MD as PCP - General (Family Medicine) [name], MD as Consulting Physician (Neurosurgery) Ralene Bathe, MD as Consulting Physician (Ophthalmology) Alen Blew, Mathis Dad, MD as Consulting Physician (Oncology) Marlaine Hind, MD as Consulting Physician (Physical Medicine and Rehabilitation) Trula Slade, DPM as Consulting Physician (Podiatry) Edythe Clarity, [facility] (Pharmacist)  Recent office visits:  7.15.22  [name] LPN. Annual Wellness. No Med changes. No follow ups on file.   Recent consult visits:  None since 10/20/20   Hospital visits:  None since 10/20/20  Objective: Lab Results  Component Value Date   CREATININE 1.46 03/29/2022   CREATININE 1.51 (H) 02/04/2022   CREATININE 1.59 (H) 09/16/2021   Lab Results  Component Value Date   HGBA1C 7.0 (H) 03/29/2022   Last diabetic Eye exam:  Lab Results  Component Value Date/Time   HMDIABEYEEXA No Retinopathy 10/28/2020 12:00 AM    Last diabetic Foot exam: No results found for: "HMDIABFOOTEX"     Component Value Date/Time   CHOL 161 03/29/2022 0957   TRIG 56.0 03/29/2022 0957   HDL 55.20 03/29/2022 0957   CHOLHDL 3 03/29/2022 0957   VLDL 11.2 03/29/2022 0957   LDLCALC 95 03/29/2022 0957   LDLCALC 85 03/16/2020 1630   LDLDIRECT 76.0 11/23/2017 0919      Latest Ref Rng & Units 03/29/2022    9:57 AM 02/04/2022    9:53 AM 09/16/2021   10:40 AM  Hepatic Function  Total Protein 6.0 - 8.3 g/dL 6.1  6.7  6.7   Albumin 3.5 - 5.2 g/dL 4.0  4.0  4.1   AST 0 - 37 U/L _0 ALT 0 - 53 U/L _1 Alk Phosphatase 39 - 117 U/L 72  76  67   Total Bilirubin 0.2 - 1.2 mg/dL 0.7  1.0  0.9    Lab Results  Component Value Date/Time   TSH 0.93 07/08/2015 08:18 AM   TSH  0.618 02/10/2015 04:30 AM   TSH 2.35 07/23/2012 08:17 AM      Latest Ref Rng & Units 03/29/2022    9:57 AM 02/04/2022    9:53 AM 09/16/2021   10:40 AM  CBC  WBC 4.0 - 10.5 K/uL 11.9  14.6  9.4   Hemoglobin 13.0 - 17.0 g/dL 12.9  13.2  13.0   Hematocrit 39.0 - 52.0 % 40.1  39.7  40.2   Platelets 150.0 - 400.0 K/uL 165.0  181  165.0    No results found for: "VD25OH"  Clinical ASCVD: No  The ASCVD Risk score (Arnett DK, et al., 2019) failed to calculate for the following reasons:   The 2019 ASCVD risk score is only valid for ages 89 to 35    Social History   Tobacco Use  Smoking Status Former   Packs/day: 0.25   Years: 20.00   Total pack  years: 5.00   Types: Cigarettes   Quit date: 09/28/1995   Years since quitting: 26.5  Smokeless Tobacco Never   BP Readings from Last 3 Encounters:  03/29/22 118/60  02/04/22 (!) 152/67  09/16/21 138/72   Pulse Readings from Last 3 Encounters:  03/29/22 (!) 47  02/04/22 (!) 31  09/16/21 (!) 51   Wt Readings from Last 3 Encounters:  03/29/22 241 lb 9.6 oz (109.6 kg)  02/04/22 241 lb 9.6 oz (109.6 kg)  09/16/21 249 lb 6.4 oz (113.1 kg)    Assessment: Review of patient past medical history, allergies, medications, health status, including review of consultants reports, laboratory and other test data, was performed as part of comprehensive evaluation and provision of chronic care management services.   SDOH:  (Social Determinants of Health) assessments and interventions performed: No, done within the past year Financial Resource Strain: Low Risk  (11/29/2021)   Overall Financial Resource Strain (CARDIA)    Difficulty of Paying Living Expenses: Not hard at all    SDOH Interventions    Flowsheet Row Chronic Care Management from 01/01/2020 in [place] Interventions Other (Comment)  Transportation Interventions Other (Comment)      CCM Care Plan No Known Allergies Medications Reviewed Today     Reviewed by Edythe Clarity, [facility] (Pharmacist) on 04/18/22 at 1615  Med List Status: <None>   Medication Order Taking? Sig Documenting Provider Last Dose Status Informant  atorvastatin (LIPITOR) 10 MG tablet [id number] Yes TAKE 1 TABLET BY MOUTH WEEKLY [name], MD Taking Active   BD PEN NEEDLE NANO 2ND GEN 32G X 4 MM MISC [id number] Yes USE AS DIRECTED [name], MD Taking Active   finasteride (PROSCAR) 5 MG tablet [id number] Yes TAKE 1 TABLET BY MOUTH EVERY DAY [name], MD Taking Active   glipiZIDE (GLUCOTROL XL) 5 MG 24 hr tablet [id number] Yes TAKE 1 TABLET BY MOUTH EVERY DAY [name], MD  Taking Active   HYDROcodone-acetaminophen (NORCO/VICODIN) 5-325 MG tablet [id number] Yes Take 1-2 tablets by mouth every 6 (six) hours as needed. [provider] Taking Active   insulin degludec (TRESIBA FLEXTOUCH) 100 UNIT/ML FlexTouch Pen [id number] Yes INJECT 0.2-0.3 MLS (20-30 UNITS TOTAL) INTO THE SKIN DAILY. [name], MD Taking Active   losartan (COZAAR) 100 MG tablet [id number] Yes TAKE 1 TABLET BY MOUTH  DAILY [name], MD Taking Active   Misc Natural Products (GLUCOSAMINE CHOND CMP ADVANCED PO) [id number] Yes Take by mouth in the morning and at bedtime. [provider] Taking Active Self  ONE TOUCH LANCETS MISC [id number] Yes Use to test blood sugars. Dx:E11.9 [name], MD Taking Active Self  [facility] VERIO test strip [id number] Yes USE TO TEST BLOOD SUGARS DAILY. DX: E11.9 [name], MD Taking Active   tamsulosin (FLOMAX) 0.4 MG CAPS capsule [id number] Yes TAKE 1 CAPSULE BY MOUTH  DAILY [name], MD Taking Active   timolol (TIMOPTIC) 0.5 % ophthalmic solution [id number] Yes 1 drop 2 (two) times daily. [provider] Taking Active   TRAVATAN Z 0.004 % SOLN ophthalmic solution [id number] Yes Place 1 drop into the left eye at bedtime.  [provider] Taking Active Self           Med Note Leonides Schanz, JENNIFER L   Wed Dec 03, 2014  9:34 AM) Received from: External Pharmacy  vitamin B-12 (CYANOCOBALAMIN) 1000 MCG tablet [id number] Yes Take 1,000 mcg by mouth daily. [provider] Taking Active            Patient Active Problem List   Diagnosis Date Noted   Enteritis due to Clostridium difficile    Discitis of lumbar region    History of osteomyelitis of lumbar vertebrae    Hyperlipidemia    CKD (chronic kidney disease), stage III ([place]) 01/13/2015   Lumbar spinal stenosis 06/26/2014   BPH (benign prostatic hyperplasia) 06/24/2014   B12 deficiency 12/12/2008   Diabetic polyneuropathy ([place]) 12/12/2008    ERECTILE DYSFUNCTION 03/18/2008   Chronic lymphocytic leukemia ([place]) 03/08/2007   Diabetes mellitus type II, controlled ([place]) 03/08/2007   Gout 03/08/2007   Glaucoma 03/08/2007   Essential hypertension 03/08/2007   Osteoarthritis 03/08/2007   Immunization History  Administered Date(s) Administered   Fluad Quad(high Dose 65+) 01/02/2019, 02/27/2020, 02/08/2021, 02/09/2022   Influenza Split 02/10/2011, 01/11/2012   Influenza Whole 03/15/2007, 01/31/2008, 02/04/2009, 02/01/2010   Influenza, High Dose Seasonal PF 01/18/2016, 01/24/2017, 01/29/2018   Influenza,inj,Quad PF,6+ Mos 02/12/2014, 01/13/2015   Moderna Covid-19 Vaccine Bivalent Booster 41yr & up 02/09/2022   Moderna Sars-Covid-2 Vaccination 05/24/2019, 06/10/2019, 02/29/2020, 08/06/2020   Pneumococcal Conjugate-13 03/23/2015   Pneumococcal Polysaccharide-23 03/24/2009, 02/12/2014   Td 03/09/2007   Tdap 09/19/2017   Conditions to be addressed/monitored: HTN, HLD and DMII  Care Plan : CGordon Updates made by DEdythe Clarity RPH since 04/18/2022 12:00 AM     Problem: HLD DMII HTN      Long-Range Goal: Disease Management   Start Date: 07/06/2020  Expected End Date: 07/06/2021  Recent Progress: On track  Priority: High  Note:   Current Barriers:  Borderline A1c High risk for hypoglycemia  Pharmacist Clinical Goal(s):  Over the next 365 days, patient will be able to control glucose and monitor both fasting and post prandial.  Interventions: 1:1 collaboration with HMarin Olp MD regarding development and update of comprehensive plan of care as evidenced by provider attestation and co-signature Inter-disciplinary care team collaboration (see longitudinal plan of care) Comprehensive medication review performed; medication list updated in electronic medical record  Hypertension (BP goal <140/90) -Controlled, not assessed -Current treatment: Losartan 100 mg tablet - taking half tablet once daily   -Medications previously tried: valsartan 80 mg once daily  -Current home readings: none provided -Current exercise habits: limited by back pain, gets occasional steroid shots in addition to taking hydrocodone -Denies hypotensive/hypertensive symptoms  -Recommended to continue current medication Counseled on low salt diet  Hyperlipidemia: (LDL goal < 100) -Controlled, not assessed -Current treatment: Atorvastatin 10  mg once WEEKLY -Medications previously tried: n/a  -No side effects, tolerates once weekly dose  -Counseled on diet and exercise extensively Recommended to continue current medication  Update 04/05/21 Still tolerating weekly dose. No changes needed at this time LDL is close to goal. Continue to work on lifestyle mods. No changes to meds.  Spinal Stenosis (Goal: Reduce pain, improve QOL) -Not ideally controlled, patient due for steroid injection -Current treatment  Hydrocodone/APAP 5-339m twice daily Appropriate, Effective, Safe, Accessible -Medications previously tried: none noted -Patient reports his back is stiff currently, however he feels his pain is managed right now.  He is due for a steroid injection which he gets every 3 months.  -Recommended to continue current medication He denies any issues with constipation at this time.  Currently having a BM once daily.Counseled on possibility of constipation with opioids and to let me know if he starts having issues he can start a stool softener and/or laxative.  No current issues so will leave alone for now.   Type II Diabetes (A1c goal <7%) 12/18/203 -Controlled, most recent A1c was 7.0 in November -Current medications:  Glipizide XL 5 mg once daily Appropriate, Effective, Query Safe,  Tresiba 14 units (AM) into the skin once daily  Appropriate, Effective, Query Safe,  -Current home glucose readings CGM: reports fasting readings are 110-119, he is not checking much after meals The lowest readings he sees are in  the 90s -Current dietary choices:  usually eats two meals per day with a snack in between  -Denies hypoglycemic/hyperglycemic symptoms -Educated on fasting and post-prandial goals. -Asked patient to start checking occasional post prandial glucose readings so we can see if glucose is spiking.  He also mentions some tingling pain in his fingers.  Unclear if this is from back pain or neuropathy.  Could happen due to spikes in glucose. -Previous discussion about adding GLP-1 such as Ozempic to replace glipizide.  Patient prefers to keep things unchanged.  In the future could consider d/c insulin AND glipizide to see if he can be managed on just Ozempic.  Would reduce hypo risk as well as provide CV benefit and weight loss. Patient to follow up with PCP in 6 months for A1c recheck. Will have CMA call in 30 days to check on post prandial readings and neuropathy symptoms.  Update 04/05/21 7 day average -118 97 is the lowest glucose he has had lately 137 highest reading recently He continues to be controlled on above medication regimen.  Discussed cardio benefits of Ozempic, at this time prefers to continue on current regimen.  Cautioned on risk of hypoglycemia, however he  does not seem to be having that issues at this time.  A1c remains controlled. Continue current meds, due for diabetic foot exam.  Patient Goals/Self-Care Activities Over the next 365 days, patient will:  - take medications as prescribed collaborate with provider on medication access solutions  Medication Assistance: None required.  Patient affirms current coverage meets needs.  FU plan - phone call with PharmD in 6 months, CMA to check on glucose logs in 30 days            Compliance/Adherence/Medication fill history: Care Gaps: NONE  Star-Rating Drugs: Atorvastatin 140m10/21/23 90ds Glipizide ER 76m71m9/18/23 90ds Losartan 100m94m/02/23 90ds  Patient's preferred pharmacy is:  [id number] [id number]: [phone] ([place]  [place] [address] [place] [address] [place] [address] Phone: [phone] Fax: [phone]  [facility] [id number]: [phone]: 6[place] [address]Phone: [phone]: [phone]  Follow Up:  Patient agrees to Care Plan and Follow-up.  Plan: Telephone follow up appointment with care management team member scheduled for:  6 months, CPA DM call 1 months   Future Appointments  Date Time Provider [place] 09/29/2022  2:00 PM HMarin Olp MD [facility] PEC  02/07/2023  1:00 PM [facility] None  02/07/2023  1:30 PM Shadad, FMathis Dad MD [facility]    CBeverly Milch PharmD Clinical Pharmacist  [facility]([phone]

## 2022-04-18 ENCOUNTER — Ambulatory Visit: Payer: Medicare Other | Admitting: Pharmacist

## 2022-04-18 DIAGNOSIS — M48061 Spinal stenosis, lumbar region without neurogenic claudication: Secondary | ICD-10-CM

## 2022-04-18 DIAGNOSIS — E1122 Type 2 diabetes mellitus with diabetic chronic kidney disease: Secondary | ICD-10-CM

## 2022-04-18 NOTE — Patient Instructions (Addendum)
Visit Information   Goals Addressed             This Visit's Progress    Monitor and Manage My Blood Sugar-Diabetes Type 2   On track    Timeframe:  Long-Range Goal Priority:  High Start Date:  04/05/21                           Expected End Date:  10/04/21                    Follow Up Date 07/04/21    - check blood sugar at prescribed times - check blood sugar if I feel it is too high or too low - enter blood sugar readings and medication or insulin into daily log    Why is this important?   Checking your blood sugar at home helps to keep it from getting very high or very low.  Writing the results in a diary or log helps the doctor know how to care for you.  Your blood sugar log should have the time, date and the results.  Also, write down the amount of insulin or other medicine that you take.  Other information, like what you ate, exercise done and how you were feeling, will also be helpful.     Notes:        Patient Care Plan: CCM Pharmacy Care Plan     Problem Identified: HLD DMII HTN      Long-Range Goal: Disease Management   Start Date: 07/06/2020  Expected End Date: 07/06/2021  Recent Progress: On track  Priority: High  Note:   Current Barriers:  Borderline A1c High risk for hypoglycemia  Pharmacist Clinical Goal(s):  Over the next 365 days, patient will be able to control glucose and monitor both fasting and post prandial.  Interventions: 1:1 collaboration with [name], MD regarding development and update of comprehensive plan of care as evidenced by provider attestation and co-signature Inter-disciplinary care team collaboration (see longitudinal plan of care) Comprehensive medication review performed; medication list updated in electronic medical record  Hypertension (BP goal <140/90) -Controlled, not assessed -Current treatment: Losartan 100 mg tablet - taking half tablet once daily  -Medications previously tried: valsartan 80 mg once  daily  -Current home readings: none provided -Current exercise habits: limited by back pain, gets occasional steroid shots in addition to taking hydrocodone -Denies hypotensive/hypertensive symptoms  -Recommended to continue current medication Counseled on low salt diet  Hyperlipidemia: (LDL goal < 100) -Controlled, not assessed -Current treatment: Atorvastatin 10 mg once WEEKLY -Medications previously tried: n/a  -No side effects, tolerates once weekly dose  -Counseled on diet and exercise extensively Recommended to continue current medication  Update 04/05/21 Still tolerating weekly dose. No changes needed at this time LDL is close to goal. Continue to work on lifestyle mods. No changes to meds.  Spinal Stenosis (Goal: Reduce pain, improve QOL) -Not ideally controlled, patient due for steroid injection -Current treatment  Hydrocodone/APAP 5-'325mg'$  twice daily Appropriate, Effective, Safe, Accessible -Medications previously tried: none noted -Patient reports his back is stiff currently, however he feels his pain is managed right now.  He is due for a steroid injection which he gets every 3 months.  -Recommended to continue current medication He denies any issues with constipation at this time.  Currently having a BM once daily.Counseled on possibility of constipation with opioids and to let me know if he starts having issues  he can start a stool softener and/or laxative.  No current issues so will leave alone for now.   Type II Diabetes (A1c goal <7%) 12/18/203 -Controlled, most recent A1c was 7.0 in November -Current medications:  Glipizide XL 5 mg once daily Appropriate, Effective, Query Safe,  Tresiba 14 units (AM) into the skin once daily  Appropriate, Effective, Query Safe,  -Current home glucose readings CGM: reports fasting readings are 110-119, he is not checking much after meals The lowest readings he sees are in the 90s -Current dietary choices:  usually eats two  meals per day with a snack in between  -Denies hypoglycemic/hyperglycemic symptoms -Educated on fasting and post-prandial goals. -Asked patient to start checking occasional post prandial glucose readings so we can see if glucose is spiking.  He also mentions some tingling pain in his fingers.  Unclear if this is from back pain or neuropathy.  Could happen due to spikes in glucose. -Previous discussion about adding GLP-1 such as Ozempic to replace glipizide.  Patient prefers to keep things unchanged.  In the future could consider d/c insulin AND glipizide to see if he can be managed on just Ozempic.  Would reduce hypo risk as well as provide CV benefit and weight loss. Patient to follow up with PCP in 6 months for A1c recheck. Will have CMA call in 30 days to check on post prandial readings and neuropathy symptoms.  Update 04/05/21 7 day average -118 97 is the lowest glucose he has had lately 137 highest reading recently He continues to be controlled on above medication regimen.  Discussed cardio benefits of Ozempic, at this time prefers to continue on current regimen.  Cautioned on risk of hypoglycemia, however he  does not seem to be having that issues at this time.  A1c remains controlled. Continue current meds, due for diabetic foot exam.  Patient Goals/Self-Care Activities Over the next 365 days, patient will:  - take medications as prescribed collaborate with provider on medication access solutions  Medication Assistance: None required.  Patient affirms current coverage meets needs.  FU plan - phone call with PharmD in 6 months, CMA to check on glucose logs in 30 days            The patient verbalized understanding of instructions, educational materials, and care plan provided today and DECLINED offer to receive copy of patient instructions, educational materials, and care plan.  Telephone follow up appointment with pharmacy team member scheduled for: 6 months  Edythe Clarity, [place], PharmD Clinical Pharmacist  [address] [facility] [phone]

## 2022-05-05 ENCOUNTER — Telehealth: Payer: Self-pay | Admitting: *Deleted

## 2022-05-05 ENCOUNTER — Encounter: Payer: Self-pay | Admitting: *Deleted

## 2022-05-05 NOTE — Patient Outreach (Signed)
  Care Coordination   Initial Visit Note   05/05/2022 Name: Kevin Rogers MRN: [id number] DOB: 01-Nov-1931  Kevin Rogers is a 89 y.o. year old male who sees Hunter, Brayton Mars, MD for primary care. I spoke with  [name] by phone today.  What matters to the patients health and wellness today?  No needs    Goals Addressed             This Visit's Progress    COMPLETED: care coordination activity       Care Coordination Interventions: Reviewed medications with patient and discussed adherence with no needed refills Reviewed scheduled/upcoming provider appointments including sufficient transportation source Screening for signs and symptoms of depression related to chronic disease state  Assessed social determinant of health barriers Educated on care management related to nurse case manager, social workers and pharmacy with no needed indicated. Pt receptive to adding contact number and information to his MyChart.         SDOH assessments and interventions completed:  Yes  SDOH Interventions Today    Flowsheet Row Most Recent Value  SDOH Interventions   Food Insecurity Interventions Intervention Not Indicated  Housing Interventions Intervention Not Indicated  Transportation Interventions Intervention Not Indicated  Utilities Interventions Intervention Not Indicated        Care Coordination Interventions:  Yes, provided   Follow up plan: No further intervention required.   Encounter Outcome:  Pt. Visit Completed   Raina Mina, RN Care Management Coordinator [place] Office [phone]

## 2022-05-05 NOTE — Patient Instructions (Signed)
Visit Information  Thank you for taking time to visit with me today. Please don't hesitate to contact me if I can be of assistance to you.   Following are the goals we discussed today:   Goals Addressed             This Visit's Progress    COMPLETED: care coordination activity       Care Coordination Interventions: Reviewed medications with patient and discussed adherence with no needed refills Reviewed scheduled/upcoming provider appointments including sufficient transportation source Screening for signs and symptoms of depression related to chronic disease state  Assessed social determinant of health barriers Educated on care management related to nurse case manager, social workers and pharmacy with no needed indicated. Pt receptive to adding contact number and information to his MyChart.         Please call the care guide team at [phone] if you need to cancel or reschedule your appointment.   If you are experiencing a Mental Health or [place] or need someone to talk to, please call the Suicide and Crisis Lifeline: 988  Patient verbalizes understanding of instructions and care plan provided today and agrees to view in [place]. Active MyChart status and patient understanding of how to access instructions and care plan via MyChart confirmed with patient.     No further follow up required: No follow up needs  Raina Mina, RN Care Management Coordinator [place] Office [phone]

## 2022-05-09 DIAGNOSIS — H02055 Trichiasis without entropian left lower eyelid: Secondary | ICD-10-CM | POA: Diagnosis not present

## 2022-05-09 DIAGNOSIS — H401132 Primary open-angle glaucoma, bilateral, moderate stage: Secondary | ICD-10-CM | POA: Diagnosis not present

## 2022-05-10 ENCOUNTER — Telehealth: Payer: Self-pay | Admitting: Pharmacist

## 2022-05-10 NOTE — Progress Notes (Signed)
Care Management & Coordination Services Pharmacy Team  Reason for Encounter: Diabetes  Contacted patient on 05/10/2022 to discuss diabetes disease state.   Recent office visits:  None  Recent consult visits:  None  Hospital visits:  None in previous 6 months  Medications: Outpatient Encounter Medications as of 05/10/2022  Medication Sig Note   atorvastatin (LIPITOR) 10 MG tablet TAKE 1 TABLET BY MOUTH WEEKLY    BD PEN NEEDLE NANO 2ND GEN 32G X 4 MM MISC USE AS DIRECTED    finasteride (PROSCAR) 5 MG tablet TAKE 1 TABLET BY MOUTH EVERY DAY    glipiZIDE (GLUCOTROL XL) 5 MG 24 hr tablet TAKE 1 TABLET BY MOUTH EVERY DAY    HYDROcodone-acetaminophen (NORCO/VICODIN) 5-325 MG tablet Take 1-2 tablets by mouth every 6 (six) hours as needed.    insulin degludec (TRESIBA FLEXTOUCH) 100 UNIT/ML FlexTouch Pen INJECT 0.2-0.3 MLS (20-30 UNITS TOTAL) INTO THE SKIN DAILY.    losartan (COZAAR) 100 MG tablet TAKE 1 TABLET BY MOUTH  DAILY    Misc Natural Products (GLUCOSAMINE CHOND CMP ADVANCED PO) Take by mouth in the morning and at bedtime.    ONE TOUCH LANCETS MISC Use to test blood sugars. Dx:E11.9    ONETOUCH VERIO test strip USE TO TEST BLOOD SUGARS DAILY. DX: E11.9    tamsulosin (FLOMAX) 0.4 MG CAPS capsule TAKE 1 CAPSULE BY MOUTH  DAILY    timolol (TIMOPTIC) 0.5 % ophthalmic solution 1 drop 2 (two) times daily.    TRAVATAN Z 0.004 % SOLN ophthalmic solution Place 1 drop into the left eye at bedtime.  12/03/2014: Received from: External Pharmacy   vitamin B-12 (CYANOCOBALAMIN) 1000 MCG tablet Take 1,000 mcg by mouth daily.    No facility-administered encounter medications on file as of 05/10/2022.    Recent Relevant Labs: Lab Results  Component Value Date/Time   HGBA1C 7.0 (H) 03/29/2022 09:57 AM   HGBA1C 6.3 09/16/2021 10:40 AM   MICROALBUR 1.3 07/08/2015 08:18 AM   MICROALBUR 1.2 07/23/2012 08:17 AM    Kidney Function Lab Results  Component Value Date/Time   CREATININE 1.46 03/29/2022  09:57 AM   CREATININE 1.51 (H) 02/04/2022 09:53 AM   CREATININE 1.59 (H) 09/16/2021 10:40 AM   CREATININE 1.66 (H) 02/05/2021 09:28 AM   CREATININE 1.54 (H) 08/07/2020 04:01 PM   CREATININE 1.43 (H) 03/16/2020 04:30 PM   CREATININE 1.3 06/09/2016 09:34 AM   CREATININE 1.5 (H) 12/08/2015 08:29 AM   GFR 42.80 (L) 03/29/2022 09:57 AM   GFRNONAA 44 (L) 02/04/2022 09:53 AM   GFRNONAA 40 (L) 08/07/2020 04:01 PM   GFRAA 47 (L) 08/07/2020 04:01 PM    Current antihyperglycemic regimen:  Glipizide 5 mg daily Tresiba 20-30 units daily   Patient verbally confirms he is taking the above medications as directed. Yes  What diet changes have been made to improve diabetes control? No recent diet changes made.  What recent interventions/DTPs have been made to improve glycemic control:  No recent interventions or DTPs.  Have there been any recent hospitalizations or ED visits since last visit with PharmD? No  Patient denies hypoglycemic symptoms.  Patient denies hyperglycemic symptoms.  How often are you checking your blood sugar? twice daily  What are your blood sugars ranging?  Fasting: 110's After meals: 120's  During the week, how often does your blood glucose drop below 70? Never  Are you checking your feet daily/regularly? Yes  Adherence Review: Is the patient currently on a STATIN medication? Yes Is the patient  currently on ACE/ARB medication? Yes Does the patient have >5 day gap between last estimated fill dates? Yes - patient denies running out of Glipizide. He was not home during the time of the call therefore, he was unable to provide last fill date he has on hand.  Star Rating Drugs:  Atorvastatin 10 mg last filled 05/10/2022 90 DS Glipizide ER 5 mg last filled 01/17/2022 90 DS Tyler Aas Flextouch last filled 04/27/2022 50 DS Losartan 100 mg last filled 04/02/2022 90 DS   Care Gaps: Annual wellness visit in last year? Yes Last eye exam / retinopathy screening:  10/29/2021 Last diabetic foot exam: 09/16/2021   Future Appointments  Date Time Provider [place]  09/29/2022  2:00 PM [name], MD [facility]  10/18/2022  4:15 PM [facility] PHARMACIST [facility]  02/07/2023  1:00 PM [facility] None  02/07/2023  1:30 PM Wyatt Portela, MD [facility] None   April D Calhoun, [place] Pharmacist Assistant [phone]

## 2022-05-13 ENCOUNTER — Telehealth: Payer: Self-pay | Admitting: Physician Assistant

## 2022-05-13 NOTE — Telephone Encounter (Signed)
Called patient per Dr. Alen Blew transition. R/s patient with new provider and notified.

## 2022-05-20 DIAGNOSIS — M7918 Myalgia, other site: Secondary | ICD-10-CM | POA: Diagnosis not present

## 2022-05-20 DIAGNOSIS — M961 Postlaminectomy syndrome, not elsewhere classified: Secondary | ICD-10-CM | POA: Diagnosis not present

## 2022-05-20 DIAGNOSIS — M48061 Spinal stenosis, lumbar region without neurogenic claudication: Secondary | ICD-10-CM | POA: Diagnosis not present

## 2022-05-27 ENCOUNTER — Other Ambulatory Visit: Payer: Self-pay | Admitting: Family Medicine

## 2022-06-07 ENCOUNTER — Other Ambulatory Visit: Payer: Self-pay | Admitting: Family Medicine

## 2022-07-25 ENCOUNTER — Other Ambulatory Visit: Payer: Self-pay | Admitting: Family Medicine

## 2022-07-25 DIAGNOSIS — E1122 Type 2 diabetes mellitus with diabetic chronic kidney disease: Secondary | ICD-10-CM

## 2022-08-17 ENCOUNTER — Telehealth: Payer: Self-pay | Admitting: Family Medicine

## 2022-08-17 NOTE — Telephone Encounter (Signed)
Health Findings  Agency:  [facility]  Caller:  Programmer, applications and title  Reason for Request:  Health Findings - HR at 42 - Asymptomatic, increase swelling on both legs. No other symptoms - change from last year. Please advise.   [phone]

## 2022-08-17 NOTE — Telephone Encounter (Signed)
FYI

## 2022-08-17 NOTE — Telephone Encounter (Signed)
Call and check on patient-if he feels he has had a change in his health status please have him schedule follow-up sooner than late May visit already planned

## 2022-08-17 NOTE — Telephone Encounter (Signed)
Called and lm for pt tcb. 

## 2022-08-19 NOTE — Telephone Encounter (Signed)
Patient stated that he is still some swelling.  It has went down in the left leg some.  Appointment made for Monday with [facility].  Advised patient if swelling gets worse, chest pains, or sob, to head to ER or Urgent care right a away and do not wait for appointment.

## 2022-08-22 ENCOUNTER — Emergency Department (HOSPITAL_COMMUNITY): Payer: Medicare Other

## 2022-08-22 ENCOUNTER — Other Ambulatory Visit: Payer: Self-pay

## 2022-08-22 ENCOUNTER — Observation Stay (HOSPITAL_COMMUNITY)
Admission: EM | Admit: 2022-08-22 | Discharge: 2022-08-23 | Disposition: A | Discharge status: Home / Self Care | Payer: Medicare Other | Attending: Internal Medicine | Admitting: Internal Medicine

## 2022-08-22 ENCOUNTER — Encounter (HOSPITAL_COMMUNITY)
Admission: EM | Disposition: A | Discharge status: Home / Self Care | Payer: Self-pay | Source: Home / Self Care | Attending: Emergency Medicine

## 2022-08-22 ENCOUNTER — Encounter (HOSPITAL_COMMUNITY): Payer: Self-pay

## 2022-08-22 ENCOUNTER — Emergency Department (HOSPITAL_BASED_OUTPATIENT_CLINIC_OR_DEPARTMENT_OTHER): Payer: Medicare Other

## 2022-08-22 ENCOUNTER — Ambulatory Visit (INDEPENDENT_AMBULATORY_CARE_PROVIDER_SITE_OTHER): Payer: Medicare Other | Admitting: Family

## 2022-08-22 VITALS — BP 166/69 | HR 36 | Temp 97.7°F | Ht 72.0 in | Wt 250.1 lb

## 2022-08-22 DIAGNOSIS — R600 Localized edema: Secondary | ICD-10-CM | POA: Diagnosis not present

## 2022-08-22 DIAGNOSIS — Z950 Presence of cardiac pacemaker: Secondary | ICD-10-CM | POA: Diagnosis not present

## 2022-08-22 DIAGNOSIS — I442 Atrioventricular block, complete: Principal | ICD-10-CM

## 2022-08-22 DIAGNOSIS — I10 Essential (primary) hypertension: Secondary | ICD-10-CM | POA: Diagnosis not present

## 2022-08-22 DIAGNOSIS — E1122 Type 2 diabetes mellitus with diabetic chronic kidney disease: Secondary | ICD-10-CM | POA: Insufficient documentation

## 2022-08-22 DIAGNOSIS — R001 Bradycardia, unspecified: Secondary | ICD-10-CM | POA: Diagnosis not present

## 2022-08-22 DIAGNOSIS — I129 Hypertensive chronic kidney disease with stage 1 through stage 4 chronic kidney disease, or unspecified chronic kidney disease: Secondary | ICD-10-CM | POA: Diagnosis not present

## 2022-08-22 DIAGNOSIS — R5383 Other fatigue: Secondary | ICD-10-CM | POA: Diagnosis not present

## 2022-08-22 DIAGNOSIS — N189 Chronic kidney disease, unspecified: Secondary | ICD-10-CM | POA: Diagnosis not present

## 2022-08-22 DIAGNOSIS — Z79899 Other long term (current) drug therapy: Secondary | ICD-10-CM | POA: Insufficient documentation

## 2022-08-22 DIAGNOSIS — Z7984 Long term (current) use of oral hypoglycemic drugs: Secondary | ICD-10-CM | POA: Diagnosis not present

## 2022-08-22 DIAGNOSIS — Z794 Long term (current) use of insulin: Secondary | ICD-10-CM | POA: Insufficient documentation

## 2022-08-22 DIAGNOSIS — R531 Weakness: Secondary | ICD-10-CM | POA: Diagnosis not present

## 2022-08-22 HISTORY — PX: PACEMAKER IMPLANT: EP1218

## 2022-08-22 LAB — BASIC METABOLIC PANEL
Anion gap: 7 (ref 5–15)
BUN: 20 mg/dL (ref 8–23)
CO2: 23 mmol/L (ref 22–32)
Calcium: 9.5 mg/dL (ref 8.9–10.3)
Chloride: 111 mmol/L (ref 98–111)
Creatinine, Ser: 1.4 mg/dL — ABNORMAL HIGH (ref 0.61–1.24)
GFR, Estimated: 48 mL/min — ABNORMAL LOW (ref >60)
Glucose, Bld: 101 mg/dL — ABNORMAL HIGH (ref 70–99)
Potassium: 5.2 mmol/L — ABNORMAL HIGH (ref 3.5–5.1)
Sodium: 141 mmol/L (ref 135–145)

## 2022-08-22 LAB — CBC
HCT: 41.3 % (ref 39.0–52.0)
Hemoglobin: 13.5 g/dL (ref 13.0–17.0)
MCH: 29.2 pg (ref 26.0–34.0)
MCHC: 32.7 g/dL (ref 30.0–36.0)
MCV: 89.4 fL (ref 80.0–100.0)
Platelets: 166 10*3/uL (ref 150–400)
RBC: 4.62 MIL/uL (ref 4.22–5.81)
RDW: 13.2 % (ref 11.5–15.5)
WBC: 8.4 10*3/uL (ref 4.0–10.5)
nRBC: 0 % (ref 0.0–0.2)

## 2022-08-22 LAB — ECHOCARDIOGRAM COMPLETE
AR max vel: 2.44 cm2
AV Area VTI: 2.12 cm2
AV Area mean vel: 2.17 cm2
AV Mean grad: 4.7 mmHg
AV Peak grad: 9 mmHg
Ao pk vel: 1.5 m/s
Area-P 1/2: 3.23 cm2
Calc EF: 64.7 %
Height: 72 in
MV VTI: 3.02 cm2
S' Lateral: 3.3 cm
Single Plane A2C EF: 66.5 %
Single Plane A4C EF: 61.2 %
Weight: 3985.92 oz

## 2022-08-22 LAB — TROPONIN I (HIGH SENSITIVITY)
Troponin I (High Sensitivity): 22 ng/L — ABNORMAL HIGH (ref <18)
Troponin I (High Sensitivity): 23 ng/L — ABNORMAL HIGH (ref <18)

## 2022-08-22 LAB — GLUCOSE, CAPILLARY: Glucose-Capillary: 161 mg/dL — ABNORMAL HIGH (ref 70–99)

## 2022-08-22 SURGERY — PACEMAKER IMPLANT

## 2022-08-22 MED ORDER — LIDOCAINE HCL (PF) 1 % IJ SOLN
INTRAMUSCULAR | Status: AC
  Filled 2022-08-22: qty 60

## 2022-08-22 MED ORDER — SODIUM CHLORIDE 0.9 % IV SOLN: INTRAVENOUS | Status: DC

## 2022-08-22 MED ORDER — HYDRALAZINE HCL 20 MG/ML IJ SOLN
INTRAMUSCULAR | Status: DC | PRN
  Administered 2022-08-22: 10 mg via INTRAVENOUS

## 2022-08-22 MED ORDER — MIDAZOLAM HCL 5 MG/5ML IJ SOLN
INTRAMUSCULAR | Status: AC
  Filled 2022-08-22: qty 5

## 2022-08-22 MED ORDER — HEPARIN (PORCINE) IN NACL 1000-0.9 UT/500ML-% IV SOLN
INTRAVENOUS | Status: DC | PRN
  Administered 2022-08-22: 500 mL

## 2022-08-22 MED ORDER — FENTANYL CITRATE (PF) 100 MCG/2ML IJ SOLN
INTRAMUSCULAR | Status: AC
  Filled 2022-08-22: qty 2

## 2022-08-22 MED ORDER — METOPROLOL TARTRATE 5 MG/5ML IV SOLN
INTRAVENOUS | Status: AC
  Filled 2022-08-22: qty 5

## 2022-08-22 MED ORDER — ACETAMINOPHEN 325 MG PO TABS: 325.0000 mg | ORAL_TABLET | ORAL | Status: DC | PRN

## 2022-08-22 MED ORDER — GLIPIZIDE ER 5 MG PO TB24
5.0000 mg | ORAL_TABLET | Freq: Every day | ORAL | Status: DC
  Administered 2022-08-23: 5 mg via ORAL
  Filled 2022-08-22: qty 1

## 2022-08-22 MED ORDER — ATORVASTATIN CALCIUM 10 MG PO TABS: 10.0000 mg | ORAL_TABLET | ORAL | Status: DC

## 2022-08-22 MED ORDER — CHLORHEXIDINE GLUCONATE 4 % EX SOLN: 60.0000 mL | Freq: Once | CUTANEOUS | Status: DC

## 2022-08-22 MED ORDER — SODIUM CHLORIDE 0.9 % IV SOLN
INTRAVENOUS | Status: AC
  Filled 2022-08-22: qty 2

## 2022-08-22 MED ORDER — CEFAZOLIN SODIUM-DEXTROSE 2-4 GM/100ML-% IV SOLN
2.0000 g | INTRAVENOUS | Status: AC
  Administered 2022-08-22: 2 g via INTRAVENOUS
  Filled 2022-08-22: qty 100

## 2022-08-22 MED ORDER — LIDOCAINE HCL (PF) 1 % IJ SOLN
INTRAMUSCULAR | Status: DC | PRN
  Administered 2022-08-22: 50 mL

## 2022-08-22 MED ORDER — SODIUM CHLORIDE 0.9 % IV SOLN
80.0000 mg | INTRAVENOUS | Status: AC
  Administered 2022-08-22 – 2022-08-23 (×2): 80 mg
  Filled 2022-08-22: qty 2

## 2022-08-22 MED ORDER — CEFAZOLIN SODIUM-DEXTROSE 1-4 GM/50ML-% IV SOLN
1.0000 g | Freq: Four times a day (QID) | INTRAVENOUS | Status: DC
  Administered 2022-08-22 – 2022-08-23 (×2): 1 g via INTRAVENOUS
  Filled 2022-08-22 (×3): qty 50

## 2022-08-22 MED ORDER — FENTANYL CITRATE (PF) 100 MCG/2ML IJ SOLN
INTRAMUSCULAR | Status: DC | PRN
  Administered 2022-08-22: 12.5 ug via INTRAVENOUS

## 2022-08-22 MED ORDER — LOSARTAN POTASSIUM 50 MG PO TABS
100.0000 mg | ORAL_TABLET | Freq: Every day | ORAL | Status: DC
  Administered 2022-08-22 – 2022-08-23 (×2): 100 mg via ORAL
  Filled 2022-08-22 (×2): qty 2

## 2022-08-22 MED ORDER — ONDANSETRON HCL 4 MG/2ML IJ SOLN: 4.0000 mg | Freq: Four times a day (QID) | INTRAMUSCULAR | Status: DC | PRN

## 2022-08-22 MED ORDER — FINASTERIDE 5 MG PO TABS
5.0000 mg | ORAL_TABLET | Freq: Every day | ORAL | Status: DC
  Administered 2022-08-22 – 2022-08-23 (×2): 5 mg via ORAL
  Filled 2022-08-22 (×2): qty 1

## 2022-08-22 MED ORDER — METOPROLOL TARTRATE 5 MG/5ML IV SOLN
INTRAVENOUS | Status: DC | PRN
  Administered 2022-08-22: 5 mg via INTRAVENOUS

## 2022-08-22 MED ORDER — MIDAZOLAM HCL 5 MG/5ML IJ SOLN
INTRAMUSCULAR | Status: DC | PRN
  Administered 2022-08-22: 1 mg via INTRAVENOUS

## 2022-08-22 SURGICAL SUPPLY — 12 items

## 2022-08-22 NOTE — Patient Instructions (Addendum)
It was very nice to see you today.    We will follow you up with you after your hospital visit today.   [facility] on [address] for compression socks/hose.  Ask for mild & medium compression - 8-41mmHg or 15-5mmHg.          PLEASE NOTE:  If you had any lab tests please let us know if you have not heard back within a few days. You may see your results on MyChart before we have a chance to review them but we will give you a call once they are reviewed by [place]. If we ordered any referrals today, please let us know if you have not heard from their office within the next week.

## 2022-08-22 NOTE — Discharge Instructions (Signed)
After Your Pacemaker   You have a [employer]onic Pacemaker  ACTIVITY Do not lift your arm above shoulder height for 1 week after your procedure. After 7 days, you may progress as below.  You should remove your sling 24 hours after your procedure, unless otherwise instructed by your provider.     Monday August 29, 2022  Tuesday August 30, 2022 Wednesday Aug 31, 2022 Thursday Sep 01, 2022   Do not lift, push, pull, or carry anything over 10 pounds with the affected arm until 6 weeks (Monday October 03, 2022 ) after your procedure.   You may drive AFTER your wound check, unless you have been told otherwise by your provider.   Ask your healthcare provider when you can go back to work   INCISION/Dressing If you are on a blood thinner such as Coumadin, Xarelto, Eliquis, Plavix, or Pradaxa please confirm with your provider when this should be resumed.   If large square, outer bandage is left in place, this can be removed after 24 hours from your procedure. Do not remove steri-strips or glue as below.   If a PRESSURE DRESSING (a bulky dressing that usually goes up over your shoulder) was applied or left in place, please follow instructions given by your provider on when to return to have this removed.   Monitor your Pacemaker site for redness, swelling, and drainage. Call the device clinic at [phone] if you experience these symptoms or fever/chills.  If your incision is sealed with Steri-strips or staples, you may shower 7 days after your procedure or when told by your provider. Do not remove the steri-strips or let the shower hit directly on your site. You may wash around your site with soap and water.    If you were discharged in a sling, please do not wear this during the day more than 48 hours after your surgery unless otherwise instructed. This may increase the risk of stiffness and soreness in your shoulder.   Avoid lotions, ointments, or perfumes over your incision until it is  well-healed.  You may use a hot tub or a pool AFTER your wound check appointment if the incision is completely closed.  Pacemaker Alerts:  Some alerts are vibratory and others beep. These are NOT emergencies. Please call our office to let us know. If this occurs at night or on weekends, it can wait until the next business day. Send a remote transmission.  If your device is capable of reading fluid status (for heart failure), you will be offered monthly monitoring to review this with you.   DEVICE MANAGEMENT Remote monitoring is used to monitor your pacemaker from home. This monitoring is scheduled every 91 days by our office. It allows [place] to keep an eye on the functioning of your device to ensure it is working properly. You will routinely see your Electrophysiologist annually (more often if necessary).   You should receive your ID card for your new device in 4-8 weeks. Keep this card with you at all times once received. Consider wearing a medical alert bracelet or necklace.  Your Pacemaker may be MRI compatible. This will be discussed at your next office visit/wound check.  You should avoid contact with strong electric or magnetic fields.   Do not use amateur (ham) radio equipment or electric (arc) welding torches. MP3 player headphones with magnets should not be used. Some devices are safe to use if held at least 12 inches (30 cm) from your Pacemaker. These include power tools, lawn  mowers, and speakers. If you are unsure if something is safe to use, ask your health care provider.  When using your cell phone, hold it to the ear that is on the opposite side from the Pacemaker. Do not leave your cell phone in a pocket over the Pacemaker.  You may safely use electric blankets, heating pads, computers, and microwave ovens.  Call the office right away if: You have chest pain. You feel more short of breath than you have felt before. You feel more light-headed than you have felt before. Your  incision starts to open up.  This information is not intended to replace advice given to you by your health care provider. Make sure you discuss any questions you have with your health care provider.

## 2022-08-22 NOTE — Interval H&P Note (Signed)
History and Physical Interval Note:  08/22/2022 4:17 PM  Kevin Rogers  has presented today for surgery, with the diagnosis of heart block.  The various methods of treatment have been discussed with the patient and family. After consideration of risks, benefits and other options for treatment, the patient has consented to  Procedure(s): PACEMAKER IMPLANT (N/A) as a surgical intervention.  The patient's history has been reviewed, patient examined, no change in status, stable for surgery.  I have reviewed the patient's chart and labs.  Questions were answered to the patient's satisfaction.     Lewayne Bunting

## 2022-08-22 NOTE — Progress Notes (Signed)
Patient ID: Kevin Rogers, male    DOB: 1932/02/14, 89 y.o.   MRN: [id number]  Chief Complaint  Patient presents with   Leg Swelling    Pt c/o bilateral leg swelling for about a month. Has tried elevating legs daily which does help slightly.     HPI: Bilateral leg swelling:   has been wearing compression socks for years, but pt wife reports legs have been even worse than today a few days ago, no change in diet, no increased sodium intake, nor on feet more than usual, no change in med doses. Pt has been trying to elevate legs more since swelling worsened.  Bradycardia:  seen by [facility] thru insurance co last week who detected low HR - in the 40s, and advised pt to schedule PCP appt. Today pt not getting higher than 36. Reports tiredness, denies dizziness or SOB, also having increased bilateral leg swelling.   Assessment & Plan:  Bradycardia by electrocardiogram - w/HTN - reports having HR in 40s last week per NP who came out to the home from [facility], also reports bilateral leg edema, feeling tired, HR not getting above 36 during visit today. Ambulance called, advised pt and wife on necessity for ED care.  -     EKG 12-Lead  Bilateral leg edema -  advised on increased mmHg compression w/bilateral socks, but pt also with bradycardia today and sending to ED, depending on outcome, edema may resolve back to baseline without increased compression.    Subjective:    Outpatient Medications Prior to Visit  Medication Sig Dispense Refill   atorvastatin (LIPITOR) 10 MG tablet TAKE 1 TABLET BY MOUTH WEEKLY 13 tablet 3   BD PEN NEEDLE NANO 2ND GEN 32G X 4 MM MISC USE AS DIRECTED 100 each 1   finasteride (PROSCAR) 5 MG tablet TAKE 1 TABLET BY MOUTH EVERY DAY 90 tablet 3   glipiZIDE (GLUCOTROL XL) 5 MG 24 hr tablet TAKE 1 TABLET BY MOUTH EVERY DAY 90 tablet 1   HYDROcodone-acetaminophen (NORCO/VICODIN) 5-325 MG tablet Take 1-2 tablets by mouth every 6 (six) hours as needed.     insulin degludec (TRESIBA  FLEXTOUCH) 100 UNIT/ML FlexTouch Pen INJECT 0.2-0.3 MLS (20-30 UNITS TOTAL) INTO THE SKIN DAILY. 3 mL 3   losartan (COZAAR) 100 MG tablet TAKE 1 TABLET BY MOUTH DAILY 90 tablet 3   Misc Natural Products (GLUCOSAMINE CHOND CMP ADVANCED PO) Take by mouth in the morning and at bedtime.     ONE TOUCH LANCETS MISC Use to test blood sugars. Dx:E11.9 100 each 11   ONETOUCH VERIO test strip USE TO TEST BLOOD SUGARS DAILY. DX: E11.9 100 strip 12   tamsulosin (FLOMAX) 0.4 MG CAPS capsule TAKE 1 CAPSULE BY MOUTH  DAILY 90 capsule 3   timolol (TIMOPTIC) 0.5 % ophthalmic solution 1 drop 2 (two) times daily.     TRAVATAN Z 0.004 % SOLN ophthalmic solution Place 1 drop into the left eye at bedtime.      vitamin B-12 (CYANOCOBALAMIN) 1000 MCG tablet Take 1,000 mcg by mouth daily.     No facility-administered medications prior to visit.   Past Medical History:  Diagnosis Date   Acute bronchitis 09/12/2007   Qualifier: Diagnosis of  By: Lovell Sheehan MD, [name]    Diabetes mellitus type 2 with complications (HCC) 1997   Glaucoma    left eye   Gout    Hyperlipidemia    Hypertension    Leukemia, chronic (HCC) 2001   does not  take treatment just has blood levels checked once a year   Past Surgical History:  Procedure Laterality Date   ANTERIOR LAT LUMBAR FUSION Left 01/03/2013   Procedure: ANTERIOR LATERAL LUMBAR FUSION lumbar three-four;  Surgeon: Reinaldo Meeker, MD;  Location: [facility];  Service: Neurosurgery;  Laterality: Left;   BACK SURGERY     lumbar fusion - 2016, feb   COLONOSCOPY W/ POLYPECTOMY     DENTAL SURGERY     implanted teeth x 2   HERNIA REPAIR  1989   LUMBAR LAMINECTOMY  1997   LUMBAR PERCUTANEOUS PEDICLE SCREW 1 LEVEL  01/03/2013   Procedure: LUMBAR PERCUTANEOUS PEDICLE SCREWS LUMBAR THREE-FOUR;  Surgeon: Reinaldo Meeker, MD;  Location: [facility];  Service: Neurosurgery;;   LUMBAR WOUND DEBRIDEMENT N/A 07/23/2014   Procedure: LUMBAR WOUND DEBRIDEMENT;  Surgeon: Aliene Beams, MD;   Location: [facility];  Service: Neurosurgery;  Laterality: N/A;   TONSILLECTOMY     No Known Allergies    Objective:    Physical Exam Vitals and nursing note reviewed.  Constitutional:      General: He is not in acute distress.    Appearance: Normal appearance.  HENT:     Head: Normocephalic.  Cardiovascular:     Rate and Rhythm: Regular rhythm. Bradycardia present.  Pulmonary:     Effort: Pulmonary effort is normal.     Breath sounds: Normal breath sounds.  Musculoskeletal:        General: Normal range of motion.     Cervical back: Normal range of motion.     Right lower leg: 3+ Edema present.     Left lower leg: 3+ Edema present.  Skin:    General: Skin is warm and dry.  Neurological:     Mental Status: He is alert and oriented to person, place, and time.  Psychiatric:        Mood and Affect: Mood normal.    BP (!) 166/69 (BP Location: Left Arm, Patient Position: Sitting, Cuff Size: Large)   Pulse (!) 36   Temp 97.7 F (36.5 C) (Temporal)   Ht 6' (1.829 m)   Wt 250 lb 2 oz (113.5 kg)   SpO2 99%   BMI 33.92 kg/m  Wt Readings from Last 3 Encounters:  08/22/22 250 lb 2 oz (113.5 kg)  03/29/22 241 lb 9.6 oz (109.6 kg)  02/04/22 241 lb 9.6 oz (109.6 kg)       Dulce Sellar, NP

## 2022-08-22 NOTE — ED Notes (Signed)
Report given to Or RN

## 2022-08-22 NOTE — ED Provider Notes (Signed)
[place] EMERGENCY DEPARTMENT AT [facility] Provider Note   CSN: [id number] Arrival date & time: 08/22/22  1204     History  Chief Complaint  Patient presents with   Bradycardia    Kevin Rogers is a 89 y.o. male.  Pt is a 89 yo male with a pmhx significant for CKD, HTN (on losartan, no bb), DM, diabetic polyneuropathy, hld, spinal stenosis, CLL, and bph.  Pt has been feeling weak, so he made an appt with his pcp.  His HR was in the 30s when he arrived, so EMS was called.   BP has been tolerating HR.  He denies dizziness or cp.       Home Medications Prior to Admission medications   Medication Sig Start Date End Date Taking? Authorizing Provider  atorvastatin (LIPITOR) 10 MG tablet TAKE 1 TABLET BY MOUTH WEEKLY 02/14/22  Yes Shelva Majestic, MD  BD PEN NEEDLE NANO 2ND GEN 32G X 4 MM MISC USE AS DIRECTED 10/13/21  Yes Shelva Majestic, MD  HYDROcodone-acetaminophen (NORCO/VICODIN) 5-325 MG tablet Take 1-2 tablets by mouth every 6 (six) hours as needed. 07/21/17  Yes [provider]  losartan (COZAAR) 100 MG tablet TAKE 1 TABLET BY MOUTH DAILY Patient taking differently: Take 100 mg by mouth daily. 05/30/22  Yes Shelva Majestic, MD  ONE TOUCH LANCETS MISC Use to test blood sugars. Dx:E11.9 08/18/14  Yes Shelva Majestic, MD  [facility] VERIO test strip USE TO TEST BLOOD SUGARS DAILY. DX: E11.9 Patient taking differently: 1 each by Other route as needed for other. USE TO TEST BLOOD SUGARS DAILY. DX: E11.9 12/29/21  Yes Shelva Majestic, MD  timolol (TIMOPTIC) 0.5 % ophthalmic solution 1 drop 2 (two) times daily. 06/05/19  Yes [provider]  TRAVATAN Z 0.004 % SOLN ophthalmic solution Place 1 drop into the left eye at bedtime.  09/03/14  Yes [provider]  vitamin B-12 (CYANOCOBALAMIN) 1000 MCG tablet Take 1,000 mcg by mouth daily.   Yes [provider]  finasteride (PROSCAR) 5 MG tablet TAKE 1 TABLET BY MOUTH EVERY DAY Patient not  taking: Reported on 08/22/2022 06/07/22   Shelva Majestic, MD  glipiZIDE (GLUCOTROL XL) 5 MG 24 hr tablet TAKE 1 TABLET BY MOUTH EVERY DAY Patient taking differently: Take 5 mg by mouth daily. 07/25/22   Shelva Majestic, MD  insulin degludec (TRESIBA FLEXTOUCH) 100 UNIT/ML FlexTouch Pen INJECT 0.2-0.3 MLS (20-30 UNITS TOTAL) INTO THE SKIN DAILY. Patient taking differently: Inject 14 Units into the skin daily. 07/25/22   Shelva Majestic, MD  Misc Natural Products (GLUCOSAMINE CHOND CMP ADVANCED PO) Take by mouth in the morning and at bedtime. Patient not taking: Reported on 08/22/2022    [provider]  tamsulosin (FLOMAX) 0.4 MG CAPS capsule TAKE 1 CAPSULE BY MOUTH  DAILY Patient not taking: Reported on 08/22/2022 04/05/22   Shelva Majestic, MD      Allergies    Patient has no known allergies.    Review of Systems   Review of Systems  Neurological:  Positive for weakness.  All other systems reviewed and are negative.   Physical Exam Updated Vital Signs BP 119/84 (BP Location: Right Arm)   Pulse (!) 30   Resp 14   Ht 6' (1.829 m)   Wt 113 kg   SpO2 100%   BMI 33.79 kg/m  Physical Exam Vitals and nursing note reviewed.  Constitutional:      Appearance: Normal appearance.  He is obese.  HENT:     Head: Normocephalic and atraumatic.     Right Ear: External ear normal.     Left Ear: External ear normal.     Nose: Nose normal.     Mouth/Throat:     Mouth: Mucous membranes are moist.     Pharynx: Oropharynx is clear.  Eyes:     Extraocular Movements: Extraocular movements intact.     Conjunctiva/sclera: Conjunctivae normal.     Pupils: Pupils are equal, round, and reactive to light.  Cardiovascular:     Rate and Rhythm: Bradycardia present.     Pulses: Normal pulses.     Heart sounds: Normal heart sounds.  Pulmonary:     Effort: Pulmonary effort is normal.     Breath sounds: Normal breath sounds.  Abdominal:     General: Abdomen is flat. Bowel sounds are  normal.     Palpations: Abdomen is soft.  Musculoskeletal:     Cervical back: Normal range of motion and neck supple.     Right lower leg: Edema present.     Left lower leg: Edema present.  Skin:    General: Skin is warm.     Capillary Refill: Capillary refill takes less than 2 seconds.  Neurological:     General: No focal deficit present.     Mental Status: He is alert and oriented to person, place, and time.  Psychiatric:        Mood and Affect: Mood normal.        Behavior: Behavior normal.     ED Results / Procedures / Treatments   Labs (all labs ordered are listed, but only abnormal results are displayed) Labs Reviewed  BASIC METABOLIC PANEL - Abnormal; Notable for the following components:      Result Value   Potassium 5.2 (*)    Glucose, Bld 101 (*)    Creatinine, Ser 1.40 (*)    GFR, Estimated 48 (*)    All other components within normal limits  TROPONIN I (HIGH SENSITIVITY) - Abnormal; Notable for the following components:   Troponin I (High Sensitivity) 22 (*)    All other components within normal limits  TROPONIN I (HIGH SENSITIVITY) - Abnormal; Notable for the following components:   Troponin I (High Sensitivity) 23 (*)    All other components within normal limits  SURGICAL PCR SCREEN  CBC    EKG EKG Interpretation  Date/Time:  Monday August 22 2022 12:16:27 EDT Ventricular Rate:  33 PR Interval:    QRS Duration: 104 QT Interval:  512 QTC Calculation: 391 R Axis:   -35 Text Interpretation: AV block, complete (third degree) Left axis deviation Probable anteroseptal infarct, old Nonspecific T abnormalities, lateral leads New since previous tracing Confirmed by Jacalyn Lefevre [phone]) on 08/22/2022 3:18:30 PM  Radiology ECHOCARDIOGRAM COMPLETE  Result Date: 08/22/2022    ECHOCARDIOGRAM REPORT   Patient Name:   Kevin Rogers Date of Exam: 08/22/2022 Medical Rec #:  [id number]    Height:       72.0 in Accession #:    [id number]   Weight:       249.1 lb Date of  Birth:  01-31-1932   BSA:          2.339 m Patient Age:    89 years     BP:           171/70 mmHg Patient Gender: M            HR:  31 bpm. Exam Location:  Inpatient Procedure: 2D Echo, Cardiac Doppler and Color Doppler STAT ECHO Indications:    Complete Heart Block  History:        Patient has no prior history of Echocardiogram examinations.                 Risk Factors:Diabetes, Hypertension and Dyslipidemia.  Sonographer:    Wallie Char Referring Phys: [id number] MICHAEL ANDREW TILLERY IMPRESSIONS  1. Left ventricular ejection fraction, by estimation, is 55 to 60%. The left ventricle has normal function. The left ventricle has no regional wall motion abnormalities. There is mild concentric left ventricular hypertrophy. Left ventricular diastolic parameters are indeterminate.  2. Right ventricular systolic function is normal. The right ventricular size is mildly enlarged. There is normal pulmonary artery systolic pressure. The estimated right ventricular systolic pressure is 35.3 mmHg.  3. The mitral valve is normal in structure. Mild mitral valve regurgitation. No evidence of mitral stenosis.  4. The aortic valve is tricuspid. Aortic valve regurgitation is not visualized. No aortic stenosis is present.  5. Aortic dilatation noted. There is mild dilatation of the ascending aorta, measuring 42 mm.  6. The inferior vena cava is normal in size with greater than 50% respiratory variability, suggesting right atrial pressure of 3 mmHg.  7. The patient was in complete heart block. FINDINGS  Left Ventricle: Left ventricular ejection fraction, by estimation, is 55 to 60%. The left ventricle has normal function. The left ventricle has no regional wall motion abnormalities. The left ventricular internal cavity size was normal in size. There is  mild concentric left ventricular hypertrophy. Left ventricular diastolic parameters are indeterminate. Right Ventricle: The right ventricular size is mildly enlarged. No  increase in right ventricular wall thickness. Right ventricular systolic function is normal. There is normal pulmonary artery systolic pressure. The tricuspid regurgitant velocity is 2.84  m/s, and with an assumed right atrial pressure of 3 mmHg, the estimated right ventricular systolic pressure is 35.3 mmHg. Left Atrium: Left atrial size was normal in size. Right Atrium: Right atrial size was normal in size. Pericardium: There is no evidence of pericardial effusion. Mitral Valve: The mitral valve is normal in structure. Mild mitral valve regurgitation. No evidence of mitral valve stenosis. MV peak gradient, 3.5 mmHg. The mean mitral valve gradient is 1.5 mmHg. Tricuspid Valve: The tricuspid valve is normal in structure. Tricuspid valve regurgitation is trivial. Aortic Valve: The aortic valve is tricuspid. Aortic valve regurgitation is not visualized. No aortic stenosis is present. Aortic valve mean gradient measures 4.7 mmHg. Aortic valve peak gradient measures 9.0 mmHg. Aortic valve area, by VTI measures 2.12 cm. Pulmonic Valve: The pulmonic valve was normal in structure. Pulmonic valve regurgitation is trivial. Aorta: The aortic root is normal in size and structure and aortic dilatation noted. There is mild dilatation of the ascending aorta, measuring 42 mm. Venous: The inferior vena cava is normal in size with greater than 50% respiratory variability, suggesting right atrial pressure of 3 mmHg. IAS/Shunts: No atrial level shunt detected by color flow Doppler.  LEFT VENTRICLE PLAX 2D LVIDd:         5.00 cm      Diastology LVIDs:         3.30 cm      LV e' medial:    8.90 cm/s LV PW:         1.00 cm      LV E/e' medial:  9.4 LV IVS:  1.20 cm      LV e' lateral:   8.37 cm/s LVOT diam:     1.90 cm      LV E/e' lateral: 10.0 LV SV:         80 LV SV Index:   34 LVOT Area:     2.84 cm  LV Volumes (MOD) LV vol d, MOD A2C: 127.0 ml LV vol d, MOD A4C: 115.0 ml LV vol s, MOD A2C: 42.5 ml LV vol s, MOD A4C: 44.6 ml  LV SV MOD A2C:     84.5 ml LV SV MOD A4C:     115.0 ml LV SV MOD BP:      79.1 ml RIGHT VENTRICLE             IVC RV Basal diam:  4.90 cm     IVC diam: 2.00 cm RV S prime:     21.30 cm/s TAPSE (M-mode): 3.1 cm LEFT ATRIUM             Index        RIGHT ATRIUM           Index LA diam:        3.70 cm 1.58 cm/m   RA Area:     15.50 cm LA Vol (A2C):   26.6 ml 11.37 ml/m  RA Volume:   36.90 ml  15.78 ml/m LA Vol (A4C):   34.8 ml 14.88 ml/m LA Biplane Vol: 32.3 ml 13.81 ml/m  AORTIC VALVE AV Area (Vmax):    2.44 cm AV Area (Vmean):   2.17 cm AV Area (VTI):     2.12 cm AV Vmax:           150.33 cm/s AV Vmean:          103.533 cm/s AV VTI:            0.377 m AV Peak Grad:      9.0 mmHg AV Mean Grad:      4.7 mmHg LVOT Vmax:         129.50 cm/s LVOT Vmean:        79.375 cm/s LVOT VTI:          0.282 m LVOT/AV VTI ratio: 0.75  AORTA Ao Root diam: 3.70 cm Ao Asc diam:  4.20 cm MITRAL VALVE               TRICUSPID VALVE MV Area (PHT): 3.23 cm    TR Peak grad:   32.3 mmHg MV Area VTI:   3.02 cm    TR Vmax:        284.00 cm/s MV Peak grad:  3.5 mmHg MV Mean grad:  1.5 mmHg    SHUNTS MV Vmax:       0.93 m/s    Systemic VTI:  0.28 m MV Vmean:      61.8 cm/s   Systemic Diam: 1.90 cm MV Decel Time: 235 msec MV E velocity: 83.60 cm/s MV A velocity: 99.60 cm/s MV E/A ratio:  0.84 Dalton McleanMD Electronically signed by Wilfred Lacy Signature Date/Time: 08/22/2022/3:16:19 PM    Final    DG Chest Port 1 View  Result Date: 08/22/2022 CLINICAL DATA:  Bradycardia. EXAM: PORTABLE CHEST 1 VIEW COMPARISON:  02/09/2015. FINDINGS: Low lung volumes accentuate the pulmonary vasculature and cardiomediastinal silhouette. No consolidation or pulmonary edema. No pleural effusion or pneumothorax. IMPRESSION: No evidence of acute cardiopulmonary disease. Electronically Signed   By: [name] M.D.   On: 08/22/2022  12:34    Procedures Procedures    Medications Ordered in ED Medications  0.9 %  sodium chloride infusion (has  no administration in time range)  chlorhexidine (HIBICLENS) 4 % liquid 4 Application (has no administration in time range)  chlorhexidine (HIBICLENS) 4 % liquid 4 Application (has no administration in time range)  0.9 %  sodium chloride infusion (has no administration in time range)  0.9 %  sodium chloride infusion (has no administration in time range)  gentamicin (GARAMYCIN) 80 mg in sodium chloride 0.9 % 500 mL irrigation (has no administration in time range)  ceFAZolin (ANCEF) IVPB 2g/100 mL premix (has no administration in time range)    ED Course/ Medical Decision Making/ A&P                             Medical Decision Making Amount and/or Complexity of Data Reviewed Labs: ordered. Radiology: ordered.  Risk Decision regarding hospitalization.   This patient presents to the ED for concern of low hr, this involves an extensive number of treatment options, and is a complaint that carries with it a high risk of complications and morbidity.  The differential diagnosis includes sinus brady, chb, electrolyte abn   Co morbidities that complicate the patient evaluation  CKD, HTN (on losartan, no bb), DM, diabetic polyneuropathy, hld, spinal stenosis, CLL, and bph   Additional history obtained:  Additional history obtained from epic chart review External records from outside source obtained and reviewed including EMS report   Lab Tests:  I Ordered, and personally interpreted labs.  The pertinent results include:  cbc nl, trop 23, bmp with mild hyperkalemia (5.2) and cr elevated at 1.4 (chronic)   Imaging Studies ordered:  I ordered imaging studies including cxr  I independently visualized and interpreted imaging which showed No evidence of acute cardiopulmonary disease.  I agree with the radiologist interpretation   Cardiac Monitoring:  The patient was maintained on a cardiac monitor.  I personally viewed and interpreted the cardiac monitored which showed an underlying  rhythm of: CHB   Medicines ordered and prescription drug management:   I have reviewed the patients home medicines and have made adjustments as needed   Critical Interventions:  Cards consult   Consultations Obtained:  I requested consultation with the cardiologist,  and discussed lab and imaging findings as well as pertinent plan - they will see pt in consult   Problem List / ED Course:  Complete heart block:  pt is awake and alert; bp is fine.  Cards consulted.  They are going to put in a pacemaker.   Reevaluation:  After the interventions noted above, I reevaluated the patient and found that they have :improved   Social Determinants of Health:  Lives at home with wife   Dispostion:  After consideration of the diagnostic results and the patients response to treatment, I feel that the patent would benefit from admission.          Final Clinical Impression(s) / ED Diagnoses Final diagnoses:  Complete heart block    Rx / DC Orders ED Discharge Orders     None         Jacalyn Lefevre, MD 08/22/22 1521

## 2022-08-22 NOTE — H&P (Addendum)
ELECTROPHYSIOLOGY H&P NOTE    Patient ID: Kevin Rogers MRN: [id number], DOB/AGE: 87-May-1933 87 y.o.  Admit date: 08/22/2022 Date of Consult: 08/22/2022  Primary Physician: Shelva Majestic, MD Primary Cardiologist: None  Electrophysiologist: New   Reason for admission: Advanced AV block  Patient Profile: Kevin Rogers is a 89 y.o. male with a history of CKD, HTN, DM2, Arthritis, spinal stenosis, CLL, and BPH who is being seen today for the evaluation of bradycardia at the request of Dr. Particia Nearing.  HPI:  Kevin Rogers is a 89 y.o. male with medical history as above. Wife states he has seemed more tired over the past "month" or so, but is not a complainer. Denies chest pain, SOB, lightheadedness, or syncope.   HH RN visited last week as part of annual insurance requirements, and noted him to have BLE edema and HRs in 40s. She recommended office follow up, which occurred this am and EKG revealed HB in the 30s. Pt was sent to ED.   Currently he is awake and alert, relatively asymptomatic at rest with BP in 160-170s and HR in 30s.   His mom and dad had "heart issues" and his wife has a PPM. He tries to be fairly active and takes his medications. He denies any use of beta blockers or diltiazem, confirms list is complete. He had yogurt for breakfast but otherwise hasn't eaten.  He denies any recent illnesses.    Labs (pending) Potassium5.2* (04/22 1210)   Creatinine, ser  1.40* (04/22 1210) PLT  166 (04/22 1210) HGB  13.5 (04/22 1210) WBC 8.4 (04/22 1210) Troponin I (High Sensitivity)22* (04/22 1210).    Past Medical History:  Diagnosis Date   Acute bronchitis 09/12/2007   Qualifier: Diagnosis of  By: Lovell Sheehan MD, [name]    Diabetes mellitus type 2 with complications 1997   Glaucoma    left eye   Gout    Hyperlipidemia    Hypertension    Leukemia, chronic 2001   does not take treatment just has blood levels checked once a year     Surgical History:  Past Surgical History:   Procedure Laterality Date   ANTERIOR LAT LUMBAR FUSION Left 01/03/2013   Procedure: ANTERIOR LATERAL LUMBAR FUSION lumbar three-four;  Surgeon: Reinaldo Meeker, MD;  Location: [facility];  Service: Neurosurgery;  Laterality: Left;   BACK SURGERY     lumbar fusion - 2016, feb   COLONOSCOPY W/ POLYPECTOMY     DENTAL SURGERY     implanted teeth x 2   HERNIA REPAIR  1989   LUMBAR LAMINECTOMY  1997   LUMBAR PERCUTANEOUS PEDICLE SCREW 1 LEVEL  01/03/2013   Procedure: LUMBAR PERCUTANEOUS PEDICLE SCREWS LUMBAR THREE-FOUR;  Surgeon: Reinaldo Meeker, MD;  Location: [facility];  Service: Neurosurgery;;   LUMBAR WOUND DEBRIDEMENT N/A 07/23/2014   Procedure: LUMBAR WOUND DEBRIDEMENT;  Surgeon: Aliene Beams, MD;  Location: [facility];  Service: Neurosurgery;  Laterality: N/A;   TONSILLECTOMY       (Not in a hospital admission)   Inpatient Medications:   Allergies: No Known Allergies  Family History  Problem Relation Age of Onset   Heart disease Mother        34, second hand   Heart disease Father        72, former smoker     Physical Exam: Vitals:   08/22/22 1215 08/22/22 1227 08/22/22 1230 08/22/22 1245  BP: (!) 166/67  (!) 168/81 (!) 171/70  Pulse: Kevin Rogers)  38  (!) 30 (!) 34  Resp: SpO2: 100%  99% 100%  Weight:  113 kg    Height:  6' (1.829 m)      GEN- NAD, A&O x 3, normal affect HEENT: Normocephalic, atraumatic Lungs- CTAB, Normal effort.  Heart- Slow but regular rate and rhythm, No M/G/R.  GI- Soft, NT, ND.  Extremities- No clubbing, cyanosis, or edema   Radiology/Studies: DG Chest Port 1 View  Result Date: 08/22/2022 CLINICAL DATA:  Bradycardia. EXAM: PORTABLE CHEST 1 VIEW COMPARISON:  02/09/2015. FINDINGS: Low lung volumes accentuate the pulmonary vasculature and cardiomediastinal silhouette. No consolidation or pulmonary edema. No pleural effusion or pneumothorax. IMPRESSION: No evidence of acute cardiopulmonary disease. Electronically Signed   By: Kevin Rogers M.D.   On: 08/22/2022 12:34    EKG: Today at PCP office likely shows 2:1 HB vs CHB at 33 bpm (personally reviewed)  EKG here appears more consistent with CHB giving similar QRS morphology despite variable P-R interval  Baseline EKG 2019 shows NSR with narrow QRS and 1st degree AV block  TELEMETRY: appears to show interrmitent 2:1 AVB and CHB (personally reviewed)  Assessment/Plan:  Advanced AV block Symptomatic with fatigue, potentially on and off for a month, but PCP visit prompted by annual insurance home visit last week.  No AV nodal agents on board.  Echo pending  Explained risks, benefits, and alternatives to PPM implantation, including but not limited to bleeding, infection, pneumothorax, pericardial effusion, lead dislodgement, heart attack, stroke, or death.  Pt verbalized understanding and agrees to proceed.   If EF down, will likely need ischemic work up first. Suspect he will need pacer regardless.  HTN Follow. Not on BB.    For questions or updates, please contact [facility] Please consult [email or web address] for contact info under Cardiology/STEMI.  [name], PA-C  08/22/2022 1:54 PM  EP Attending  Patient seen and examined. He is a very pleasant 89 yo man with a h/o HTN who has developed CHB. He has felt poorly for almost a month. He denies anginal symptoms. He notes that his peripheral edema which is normally mild has worsened during the same period of time. He has not had syncope. On exam he is a well appearing elderly man, NAD. Lungs are clear and CV reveals a reg brady. Ext with 2+ edema. Neuro is non-focal. Tele with CHB.  A/P CHB alternating with 2:1 AV block - I have discussed the treatment options with the patient and his wife and they wish to proceed. HTN - after his PPM has been placed, if needed an AV nodal blocking drug could be considered.  [name] Germaine Ripp,MD

## 2022-08-22 NOTE — ED Triage Notes (Signed)
Pt to the Ed from dr office  via ems with a CC of low heart rate. Pt relays at his check up today he was told to go the ed because his heart rate is in the 30s. Pt relays he has been feeling more lethargic over the last few weeks. Pt denies cp, sob, dizziness, loc at this time.

## 2022-08-23 ENCOUNTER — Observation Stay (HOSPITAL_COMMUNITY): Payer: Medicare Other

## 2022-08-23 ENCOUNTER — Encounter (HOSPITAL_COMMUNITY): Payer: Self-pay | Admitting: Internal Medicine

## 2022-08-23 DIAGNOSIS — Z7984 Long term (current) use of oral hypoglycemic drugs: Secondary | ICD-10-CM | POA: Diagnosis not present

## 2022-08-23 DIAGNOSIS — Z794 Long term (current) use of insulin: Secondary | ICD-10-CM | POA: Diagnosis not present

## 2022-08-23 DIAGNOSIS — Z950 Presence of cardiac pacemaker: Secondary | ICD-10-CM | POA: Diagnosis not present

## 2022-08-23 DIAGNOSIS — I129 Hypertensive chronic kidney disease with stage 1 through stage 4 chronic kidney disease, or unspecified chronic kidney disease: Secondary | ICD-10-CM | POA: Diagnosis not present

## 2022-08-23 DIAGNOSIS — I442 Atrioventricular block, complete: Secondary | ICD-10-CM | POA: Diagnosis not present

## 2022-08-23 DIAGNOSIS — E1122 Type 2 diabetes mellitus with diabetic chronic kidney disease: Secondary | ICD-10-CM | POA: Diagnosis not present

## 2022-08-23 DIAGNOSIS — N189 Chronic kidney disease, unspecified: Secondary | ICD-10-CM | POA: Diagnosis not present

## 2022-08-23 DIAGNOSIS — Z79899 Other long term (current) drug therapy: Secondary | ICD-10-CM | POA: Diagnosis not present

## 2022-08-23 MED ORDER — ACETAMINOPHEN 325 MG PO TABS: 325.0000 mg | ORAL_TABLET | ORAL | Status: AC | PRN

## 2022-08-23 NOTE — Discharge Summary (Addendum)
ELECTROPHYSIOLOGY PROCEDURE DISCHARGE SUMMARY    Patient ID: Kevin Rogers,  MRN: [id number], DOB/AGE: Dec 26, 1931 89 y.o.  Admit date: 08/22/2022 Discharge date: 08/23/2022  Primary Care Physician: Shelva Majestic, MD  Primary Cardiologist: None  Electrophysiologist: Dr. Ladona Ridgel   Primary Discharge Diagnosis:  Advanced AV block status post pacemaker implantation this admission  Secondary Discharge Diagnosis:  HTN  No Known Allergies   Procedures This Admission:  1.  Implantation of a Medtronic Dual Chamber PPM on 4/22 by Dr. Ladona Ridgel. The patient received a Medtronic Azure XT DR [id number]  with a Medtronic CapSureFix Novus 5076 right atrial lead and a Medtronic SelectSure 3830 right ventricular lead.  There were no immediate post procedure complications.   2.  CXR on 08/23/2022 demonstrated no pneumothorax status post device implantation.       Brief HPI: Kevin Rogers is a 89 y.o. male was admitted for new bradycardia and electrophysiology team asked to see for consideration of PPM implantation.  Past medical history includes above.  The patient has had AV block without reversible causes identified.  Risks, benefits, and alternatives to PPM implantation were reviewed with the patient who wished to proceed.   Hospital Course:  The patient was admitted and underwent implantation of a Medtronic dual chamber PPM with details as outlined above.  He was monitored on telemetry overnight which demonstrated appropriate pacing.  Left chest was without hematoma or ecchymosis.  The device was interrogated and found to be functioning normally.  CXR was obtained and demonstrated no pneumothorax status post device implantation.  Wound care, arm mobility, and restrictions were reviewed with the patient.  The patient was examined and considered stable for discharge to home.    Anticoagulation resumption This patient is not on anticoagulation     Physical Exam: Vitals:   08/22/22 1951  08/22/22 2337 08/23/22 0421 08/23/22 0809  BP: 133/66 (!) 155/105 (!) 174/91 (!) 164/74  Pulse:  60 62 66  Resp: Temp: 99 F (37.2 C) 98 F (36.7 C) 99 F (37.2 C) 98 F (36.7 C)  TempSrc: Oral Oral Oral Oral  SpO2:  95% 97% 96%  Weight:      Height:        GEN- NAD. A&O x 3.  HEENT: Normocephalic, atraumatic Lungs- CTAB, Normal effort.  Heart- RRR, No M/G/R.  GI- Soft, NT, ND.  Extremities- No clubbing, cyanosis, or edema;  Skin- warm and dry, no rash or lesion, left chest without hematoma/ecchymosis  Discharge Medications:  Allergies as of 08/23/2022   No Known Allergies      Medication List     TAKE these medications    acetaminophen 325 MG tablet Commonly known as: TYLENOL Take 1-2 tablets (325-650 mg total) by mouth every 4 (four) hours as needed for mild pain.   atorvastatin 10 MG tablet Commonly known as: LIPITOR TAKE 1 TABLET BY MOUTH WEEKLY   BD Pen Needle Nano 2nd Gen 32G X 4 MM Misc Generic drug: Insulin Pen Needle USE AS DIRECTED   cyanocobalamin 1000 MCG tablet Commonly known as: VITAMIN B12 Take 1,000 mcg by mouth daily.   finasteride 5 MG tablet Commonly known as: PROSCAR TAKE 1 TABLET BY MOUTH EVERY DAY   glipiZIDE 5 MG 24 hr tablet Commonly known as: GLUCOTROL XL TAKE 1 TABLET BY MOUTH EVERY DAY   GLUCOSAMINE CHOND CMP ADVANCED PO Take by mouth in the morning and at bedtime.   HYDROcodone-acetaminophen 5-325 MG tablet Commonly known  as: NORCO/VICODIN Take 1-2 tablets by mouth every 6 (six) hours as needed.   losartan 100 MG tablet Commonly known as: COZAAR TAKE 1 TABLET BY MOUTH DAILY   ONE TOUCH LANCETS Misc Use to test blood sugars. Dx:E11.9   OneTouch Verio test strip Generic drug: glucose blood USE TO TEST BLOOD SUGARS DAILY. DX: E11.9 What changed: See the new instructions.   tamsulosin 0.4 MG Caps capsule Commonly known as: FLOMAX TAKE 1 CAPSULE BY MOUTH  DAILY   timolol 0.5 % ophthalmic  solution Commonly known as: TIMOPTIC 1 drop 2 (two) times daily.   Travatan Z 0.004 % Soln ophthalmic solution Generic drug: Travoprost (BAK Free) Place 1 drop into the left eye at bedtime.   Evaristo Bury FlexTouch 100 UNIT/ML FlexTouch Pen Generic drug: insulin degludec INJECT 0.2-0.3 MLS (20-30 UNITS TOTAL) INTO THE SKIN DAILY. What changed: See the new instructions.        Disposition:    Follow-up Information     [facility] Follow up.   Specialty: Cardiology Why: on 5/2 at 0840 for post pacemaker check Contact information: [address], [address] [id number] mc [place] [address] [phone]                Duration of Discharge Encounter: Greater than 30 minutes including physician time.  [name], PA-C  08/23/2022 9:02 AM  EP Attending  Patient seen and examined. Agree with above. The patient is doing well this morning after DDD PM insertion. His PM interrogation under my direction demonstrates normal DDD PM function. His CXR demonstrates no PTX and leads are in good position. He will be discharged home with usual followup.   [name] Kai Railsback,MD

## 2022-08-23 NOTE — Progress Notes (Addendum)
Explained discharge instructions to patient. Reviewed follow up appointment and next medication administration times. Also reviewed education. Patient verbalized having an understanding for instructions given. All belongings are in the patient's possession. IV and telemetry were removed. CCMD was notified. No other needs verbalized. Transported downstairs for discharge. 

## 2022-08-23 NOTE — Plan of Care (Signed)

## 2022-08-30 DIAGNOSIS — M961 Postlaminectomy syndrome, not elsewhere classified: Secondary | ICD-10-CM | POA: Diagnosis not present

## 2022-08-30 DIAGNOSIS — M7918 Myalgia, other site: Secondary | ICD-10-CM | POA: Diagnosis not present

## 2022-08-31 NOTE — Patient Instructions (Incomplete)

## 2022-09-01 ENCOUNTER — Ambulatory Visit: Payer: Medicare Other | Attending: Internal Medicine

## 2022-09-01 DIAGNOSIS — I442 Atrioventricular block, complete: Secondary | ICD-10-CM

## 2022-09-01 LAB — CUP PACEART INCLINIC DEVICE CHECK
Date Time Interrogation Session: 20240502111831
Implantable Lead Connection Status: 753985
Implantable Lead Connection Status: 753985
Implantable Lead Implant Date: 20240422
Implantable Lead Implant Date: 20240422
Implantable Lead Location: 753859
Implantable Lead Location: 753860
Implantable Lead Model: 3830
Implantable Lead Model: 5076
Implantable Pulse Generator Implant Date: 20240422

## 2022-09-01 NOTE — Progress Notes (Addendum)
Error in saving report. See scanned media for details.   Wound check appointment. Steri-strips removed. Wound without redness or edema. Pin hole size opening at distal end of incision. Minimal purulent drainage noted. Otilio Saber, PA in to assess and consulted with Dr. Ladona Ridgel who advised to leave open and have patient clean with warm soapy water and clean wash cloth twice a daily. Patient will return 09/08/22 for wound recheck with Dr. Ladona Ridgel is in office.   Thresholds, sensing, and impedances consistent with implant measurements. Device programmed at 3.5V/auto capture programmed on for extra safety margin until 3 month visit. Histogram distribution appropriate for patient and level of activity. No mode switches or high ventricular rates noted. Patient educated about wound care, arm mobility, lifting restrictions. ROV in 3 months with implanting physician.

## 2022-09-08 ENCOUNTER — Ambulatory Visit: Payer: Medicare Other | Attending: Internal Medicine

## 2022-09-08 DIAGNOSIS — I442 Atrioventricular block, complete: Secondary | ICD-10-CM

## 2022-09-08 NOTE — Progress Notes (Signed)
Wound recheck in clinic.  Wound incision closed.  Prior open area well healed.  Follow up as scheduled.

## 2022-09-08 NOTE — Patient Instructions (Signed)
Follow up as scheduled.  

## 2022-09-09 ENCOUNTER — Encounter: Payer: Self-pay | Admitting: Internal Medicine

## 2022-09-29 ENCOUNTER — Ambulatory Visit (INDEPENDENT_AMBULATORY_CARE_PROVIDER_SITE_OTHER): Payer: Medicare Other | Admitting: Family Medicine

## 2022-09-29 ENCOUNTER — Encounter: Payer: Self-pay | Admitting: Family Medicine

## 2022-09-29 VITALS — BP 128/78 | HR 84 | Temp 98.0°F | Ht 72.0 in | Wt 242.8 lb

## 2022-09-29 DIAGNOSIS — E538 Deficiency of other specified B group vitamins: Secondary | ICD-10-CM | POA: Diagnosis not present

## 2022-09-29 DIAGNOSIS — I10 Essential (primary) hypertension: Secondary | ICD-10-CM

## 2022-09-29 DIAGNOSIS — C9110 Chronic lymphocytic leukemia of B-cell type not having achieved remission: Secondary | ICD-10-CM | POA: Diagnosis not present

## 2022-09-29 DIAGNOSIS — N183 Chronic kidney disease, stage 3 unspecified: Secondary | ICD-10-CM

## 2022-09-29 DIAGNOSIS — E1122 Type 2 diabetes mellitus with diabetic chronic kidney disease: Secondary | ICD-10-CM

## 2022-09-29 DIAGNOSIS — Z794 Long term (current) use of insulin: Secondary | ICD-10-CM | POA: Diagnosis not present

## 2022-09-29 DIAGNOSIS — N182 Chronic kidney disease, stage 2 (mild): Secondary | ICD-10-CM | POA: Diagnosis not present

## 2022-09-29 NOTE — Progress Notes (Signed)
Phone [phone] In person visit   Subjective:   Kevin Rogers is a 89 y.o. year old very pleasant male patient who presents for/with See problem oriented charting Chief Complaint  Patient presents with   Medical Management of Chronic Issues   Diabetes   Hypertension   Edema    Pt c/o bilateral ankle edema    Past Medical History-  Patient Active Problem List   Diagnosis Date Noted   Chronic lymphocytic leukemia (HCC) 03/08/2007    Priority: High   Diabetes mellitus type II, controlled (HCC) 03/08/2007    Priority: High   History of osteomyelitis of lumbar vertebrae     Priority: Medium    Hyperlipidemia     Priority: Medium    CKD (chronic kidney disease), stage III (HCC) 01/13/2015    Priority: Medium    Lumbar spinal stenosis 06/26/2014    Priority: Medium    BPH (benign prostatic hyperplasia) 06/24/2014    Priority: Medium    Gout 03/08/2007    Priority: Medium    Essential hypertension 03/08/2007    Priority: Medium    Enteritis due to Clostridium difficile     Priority: Low   Discitis of lumbar region     Priority: Low   B12 deficiency 12/12/2008    Priority: Low   Diabetic polyneuropathy (HCC) 12/12/2008    Priority: Low   ERECTILE DYSFUNCTION 03/18/2008    Priority: Low   Glaucoma 03/08/2007    Priority: Low   Osteoarthritis 03/08/2007    Priority: Low   Complete heart block (HCC) 08/22/2022    Medications- reviewed and updated Current Outpatient Medications  Medication Sig Dispense Refill   acetaminophen (TYLENOL) 325 MG tablet Take 1-2 tablets (325-650 mg total) by mouth every 4 (four) hours as needed for mild pain.     atorvastatin (LIPITOR) 10 MG tablet TAKE 1 TABLET BY MOUTH WEEKLY 13 tablet 3   BD PEN NEEDLE NANO 2ND GEN 32G X 4 MM MISC USE AS DIRECTED 100 each 1   finasteride (PROSCAR) 5 MG tablet TAKE 1 TABLET BY MOUTH EVERY DAY 90 tablet 3   glipiZIDE (GLUCOTROL XL) 5 MG 24 hr tablet TAKE 1 TABLET BY MOUTH EVERY DAY (Patient taking  differently: Take 5 mg by mouth daily.) 90 tablet 1   insulin degludec (TRESIBA FLEXTOUCH) 100 UNIT/ML FlexTouch Pen INJECT 0.2-0.3 MLS (20-30 UNITS TOTAL) INTO THE SKIN DAILY. (Patient taking differently: Inject 14 Units into the skin daily.) 3 mL 3   losartan (COZAAR) 50 MG tablet Take 50 mg by mouth daily. Has been taking half of 100 mg for years- next prescription should be 50 mg     Misc Natural Products (GLUCOSAMINE CHOND CMP ADVANCED PO) Take by mouth in the morning and at bedtime.     ONE TOUCH LANCETS MISC Use to test blood sugars. Dx:E11.9 100 each 11   ONETOUCH VERIO test strip USE TO TEST BLOOD SUGARS DAILY. DX: E11.9 (Patient taking differently: 1 each by Other route as needed for other. USE TO TEST BLOOD SUGARS DAILY. DX: E11.9) 100 strip 12   tamsulosin (FLOMAX) 0.4 MG CAPS capsule TAKE 1 CAPSULE BY MOUTH  DAILY 90 capsule 3   timolol (TIMOPTIC) 0.5 % ophthalmic solution 1 drop 2 (two) times daily.     TRAVATAN Z 0.004 % SOLN ophthalmic solution Place 1 drop into the left eye at bedtime.      vitamin B-12 (CYANOCOBALAMIN) 1000 MCG tablet Take 1,000 mcg by mouth daily.  HYDROcodone-acetaminophen (NORCO/VICODIN) 5-325 MG tablet Take 1-2 tablets by mouth every 6 (six) hours as needed. (Patient not taking: Reported on 09/29/2022)     No current facility-administered medications for this visit.     Objective:  BP 128/78   Pulse 84   Temp 98 F (36.7 C)   Ht 6' (1.829 m)   Wt 242 lb 12.8 oz (110.1 kg)   SpO2 98%   BMI 32.93 kg/m  Gen: NAD, resting comfortably CV: RRR no murmurs rubs or gallops Lungs: CTAB no crackles, wheeze, rhonchi Ext: trace-1+ edema Skin: warm, dry     Assessment and Plan   # Complete heart block-hospitalization August 22, 2022 for pacemaker implantation  -April visit with [place] also mentioned bilateral leg swelling-was advised to increase compression stockings (he is using consistently) and could improve some after pacemaker . Some of his socks are  older- plans to update these.   #hypertension/CKD stage III S: medication: Losartan 100 mg daily (takes half tablet though so 50 mg)   GFR typically in the  40's-50s-previously on meloxicam but stopped in 2017 BP Readings from Last 3 Encounters:  09/29/22 128/78  08/23/22 (!) 150/85  08/22/22 (!) 166/69  A/P: Hypertension-stable- continue current medicines - I do think his blood pressure is higher when he comes into the office due to pain but when he settles seems to improve like today  CKD stage III-we discussed this is really mild for his age- continue to monitor with labs today- last GFR at 51   # Diabetes S: Medication: tresiba 14 units.  Taking Glipizide5 mg extended release CBGs- 90 day average of 112, lowest of 93 in last 90 days Exercise and diet- No exercise due to immobility - does at least get out to [facility] Lab Results  Component Value Date   HGBA1C 7.0 (H) 03/29/2022   HGBA1C 6.3 09/16/2021   HGBA1C 6.8 (H) 02/08/2021  A/P: hopefully stable- update a1c today. Continue current meds for now   Patient also likely with diabetic polyneuropathy-tingling in the feet and shooting pains-also may be related to his back pain/lumbar stenosis. Stable on no specific medicine for nerve pain  -also notes in his hands as of 03/2022- still notes this today  #hyperlipidemia S: Medication:Atorvastatin 10 mg once a week. Past primary prevention age range-we are trying to target LDL at least around 100 Lab Results  Component Value Date   CHOL 161 03/29/2022   HDL 55.20 03/29/2022   LDLCALC 95 03/29/2022   LDLDIRECT 76.0 11/23/2017   TRIG 56.0 03/29/2022   CHOLHDL 3 03/29/2022   A/P: reasonable control- continue current medications    #CLL-continues yearly visits with oncology. Diagnosed in 2000. We will check CBC with differential today to make sure stable. Last check 02/04/22- usually yearly- already scheduled  #Lumbar spinal stenosis S: Limits mobility-chronic back pain. Currently  taking hydrocodone through [facility]- prior Dr. Murray Hodgkins A/P: imperfect but manageable control- continue current medications    # B12 deficiency S: Current treatment/medication (oral vs. IM): Oral 1000 mcg Lab Results  Component Value Date   VITAMINB12 >1504 (H) 09/16/2021  A/P: hopefully stable- update b12 today. Continue current meds for now    # BPH S:medication: finasteride 5 mg   , tamsulosin 0.5 mg  - slow stream even with this A/P: tolerable- continue current medications    Recommended follow up: Return in about 6 months (around 04/01/2023) for physical or sooner if needed.Schedule b4 you leave. Future Appointments  Date Time  Provider Department Center  10/18/2022  4:15 PM Erroll Luna, [facility] None  11/24/2022  7:05 AM [facility] DEVICE REMOTES [facility]  11/30/2022  2:15 PM Marinus Maw, MD [facility]  02/10/2023  1:15 PM [facility] None  02/10/2023  1:40 PM Georga Kaufmann T, PA-C [facility] None  02/23/2023  7:05 AM [facility] DEVICE REMOTES [facility]  05/25/2023  7:05 AM [facility] DEVICE REMOTES [facility]  08/24/2023  7:05 AM [facility] DEVICE REMOTES [facility]  11/23/2023  7:05 AM [facility] DEVICE REMOTES [facility]  02/22/2024  7:05 AM [facility] DEVICE REMOTES [facility]  05/23/2024  7:05 AM [facility] DEVICE REMOTES [facility]  08/22/2024  7:05 AM [facility] DEVICE REMOTES [facility]    Lab/Order associations:   ICD-10-CM   1. Chronic lymphocytic leukemia (HCC)  C91.10     2. Controlled type 2 diabetes mellitus with stage 2 chronic kidney disease, without long-term current use of insulin (HCC)  E11.22 Hemoglobin A1c   N18.2 CBC with Differential/Platelet    Comprehensive metabolic panel    3. Essential hypertension  I10 CBC with Differential/Platelet    Comprehensive metabolic  panel    4. Stage 3 chronic kidney disease, unspecified whether stage 3a or 3b CKD (HCC)  N18.30     5. B12 deficiency  E53.8 Vitamin B12     No orders of the defined types were placed in this encounter.  Return precautions advised.  Tana Conch, MD

## 2022-09-29 NOTE — Patient Instructions (Addendum)
Have diabetic eye exam sent to [place] next month at [phone].  For compression stockings- would like for them to be 20 -30 mmhg  Please stop by lab before you go If you have mychart- we will send your results within 3 business days of [place] receiving them.  If you do not have mychart- we will call you about results within 5 business days of [place] receiving them.  *please also note that you will see labs on mychart as soon as they post. I will later go in and write notes on them- will say "notes from Dr. Durene Cal"   Recommended follow up: Return in about 6 months (around 04/01/2023) for physical or sooner if needed.Schedule b4 you leave.

## 2022-09-30 LAB — CBC WITH DIFFERENTIAL/PLATELET
Basophils Absolute: 0.1 10*3/uL (ref 0.0–0.1)
Basophils Relative: 0.6 % (ref 0.0–3.0)
Eosinophils Absolute: 0.1 10*3/uL (ref 0.0–0.7)
Eosinophils Relative: 1.4 % (ref 0.0–5.0)
HCT: 40.9 % (ref 39.0–52.0)
Hemoglobin: 13.4 g/dL (ref 13.0–17.0)
Lymphocytes Relative: 53 % — ABNORMAL HIGH (ref 12.0–46.0)
Lymphs Abs: 5.1 10*3/uL — ABNORMAL HIGH (ref 0.7–4.0)
MCHC: 32.7 g/dL (ref 30.0–36.0)
MCV: 89 fl (ref 78.0–100.0)
Monocytes Absolute: 0.4 10*3/uL (ref 0.1–1.0)
Monocytes Relative: 3.7 % (ref 3.0–12.0)
Neutro Abs: 4 10*3/uL (ref 1.4–7.7)
Neutrophils Relative %: 41.3 % — ABNORMAL LOW (ref 43.0–77.0)
Platelets: 175 10*3/uL (ref 150.0–400.0)
RBC: 4.59 Mil/uL (ref 4.22–5.81)
RDW: 13.9 % (ref 11.5–15.5)
WBC: 9.6 10*3/uL (ref 4.0–10.5)

## 2022-09-30 LAB — VITAMIN B12: Vitamin B-12: 812 pg/mL (ref 211–911)

## 2022-09-30 LAB — COMPREHENSIVE METABOLIC PANEL
ALT: 11 U/L (ref 0–53)
AST: 15 U/L (ref 0–37)
Albumin: 4 g/dL (ref 3.5–5.2)
Alkaline Phosphatase: 70 U/L (ref 39–117)
BUN: 25 mg/dL — ABNORMAL HIGH (ref 6–23)
CO2: 28 mEq/L (ref 19–32)
Calcium: 9.4 mg/dL (ref 8.4–10.5)
Chloride: 107 mEq/L (ref 96–112)
Creatinine, Ser: 1.66 mg/dL — ABNORMAL HIGH (ref 0.40–1.50)
GFR: 36.56 mL/min — ABNORMAL LOW (ref >60.00)
Glucose, Bld: 85 mg/dL (ref 70–99)
Potassium: 4.9 mEq/L (ref 3.5–5.1)
Sodium: 142 mEq/L (ref 135–145)
Total Bilirubin: 1.1 mg/dL (ref 0.2–1.2)
Total Protein: 6.6 g/dL (ref 6.0–8.3)

## 2022-09-30 LAB — HEMOGLOBIN A1C: Hgb A1c MFr Bld: 6.2 % (ref 4.6–6.5)

## 2022-10-11 DIAGNOSIS — M961 Postlaminectomy syndrome, not elsewhere classified: Secondary | ICD-10-CM | POA: Diagnosis not present

## 2022-10-11 DIAGNOSIS — M7918 Myalgia, other site: Secondary | ICD-10-CM | POA: Diagnosis not present

## 2022-10-11 DIAGNOSIS — Z681 Body mass index (BMI) 19 or less, adult: Secondary | ICD-10-CM | POA: Diagnosis not present

## 2022-10-18 ENCOUNTER — Other Ambulatory Visit: Payer: Self-pay | Admitting: Family Medicine

## 2022-10-18 ENCOUNTER — Encounter: Payer: Medicare Other | Admitting: Pharmacist

## 2022-10-28 DIAGNOSIS — H6591 Unspecified nonsuppurative otitis media, right ear: Secondary | ICD-10-CM | POA: Diagnosis not present

## 2022-10-28 DIAGNOSIS — J069 Acute upper respiratory infection, unspecified: Secondary | ICD-10-CM | POA: Diagnosis not present

## 2022-10-28 DIAGNOSIS — H6690 Otitis media, unspecified, unspecified ear: Secondary | ICD-10-CM | POA: Diagnosis not present

## 2022-10-28 DIAGNOSIS — R051 Acute cough: Secondary | ICD-10-CM | POA: Diagnosis not present

## 2022-11-08 ENCOUNTER — Encounter: Payer: Self-pay | Admitting: Family Medicine

## 2022-11-08 DIAGNOSIS — H2513 Age-related nuclear cataract, bilateral: Secondary | ICD-10-CM | POA: Diagnosis not present

## 2022-11-08 DIAGNOSIS — H401132 Primary open-angle glaucoma, bilateral, moderate stage: Secondary | ICD-10-CM | POA: Diagnosis not present

## 2022-11-08 DIAGNOSIS — H5203 Hypermetropia, bilateral: Secondary | ICD-10-CM | POA: Diagnosis not present

## 2022-11-08 DIAGNOSIS — H43813 Vitreous degeneration, bilateral: Secondary | ICD-10-CM | POA: Diagnosis not present

## 2022-11-08 DIAGNOSIS — E119 Type 2 diabetes mellitus without complications: Secondary | ICD-10-CM | POA: Diagnosis not present

## 2022-11-08 DIAGNOSIS — H524 Presbyopia: Secondary | ICD-10-CM | POA: Diagnosis not present

## 2022-11-08 DIAGNOSIS — H25013 Cortical age-related cataract, bilateral: Secondary | ICD-10-CM | POA: Diagnosis not present

## 2022-11-08 LAB — HM DIABETES EYE EXAM

## 2022-11-24 ENCOUNTER — Ambulatory Visit (INDEPENDENT_AMBULATORY_CARE_PROVIDER_SITE_OTHER): Payer: Medicare Other

## 2022-11-24 DIAGNOSIS — I442 Atrioventricular block, complete: Secondary | ICD-10-CM

## 2022-11-24 LAB — CUP PACEART REMOTE DEVICE CHECK
Battery Remaining Longevity: 118 mo
Battery Voltage: 3.13 V
Brady Statistic AP VS Percent: 0 %
Brady Statistic AS VP Percent: 43.23 %
Brady Statistic AS VS Percent: 0.01 %
Brady Statistic RA Percent Paced: 56.71 %
Brady Statistic RV Percent Paced: 99.98 %
Date Time Interrogation Session: 20240724223314
Implantable Lead Connection Status: 753985
Implantable Lead Connection Status: 753985
Implantable Lead Implant Date: 20240422
Implantable Lead Location: 753859
Implantable Lead Location: 753860
Implantable Lead Model: 3830
Implantable Lead Model: 5076
Implantable Pulse Generator Implant Date: 20240422
Lead Channel Impedance Value: 323 Ohm
Lead Channel Impedance Value: 323 Ohm
Lead Channel Impedance Value: 361 Ohm
Lead Channel Impedance Value: 456 Ohm
Lead Channel Pacing Threshold Amplitude: 0.375 V
Lead Channel Pacing Threshold Amplitude: 0.625 V
Lead Channel Pacing Threshold Pulse Width: 0.4 ms
Lead Channel Pacing Threshold Pulse Width: 0.4 ms
Lead Channel Sensing Intrinsic Amplitude: 17.875 mV
Lead Channel Sensing Intrinsic Amplitude: 17.875 mV
Lead Channel Sensing Intrinsic Amplitude: 2.375 mV
Lead Channel Sensing Intrinsic Amplitude: 2.375 mV
Lead Channel Setting Pacing Amplitude: 3.5 V
Lead Channel Setting Pacing Amplitude: 3.5 V
Lead Channel Setting Pacing Pulse Width: 0.4 ms
Lead Channel Setting Sensing Sensitivity: 1.2 mV
Zone Setting Status: 755011

## 2022-11-30 ENCOUNTER — Ambulatory Visit: Payer: Medicare Other | Attending: Internal Medicine | Admitting: Internal Medicine

## 2022-11-30 ENCOUNTER — Encounter: Payer: Self-pay | Admitting: Internal Medicine

## 2022-11-30 VITALS — BP 138/72 | HR 97 | Ht 72.0 in | Wt 235.0 lb

## 2022-11-30 DIAGNOSIS — I442 Atrioventricular block, complete: Secondary | ICD-10-CM | POA: Diagnosis not present

## 2022-11-30 NOTE — Patient Instructions (Signed)
Medication Instructions:  Your physician recommends that you continue on your current medications as directed. Please refer to the Current Medication list given to you today.  *If you need a refill on your cardiac medications before your next appointment, please call your pharmacy*  Lab Work: None ordered today.  Testing/Procedures: None ordered today.  Follow-Up: At [facility], you and your health needs are our priority.  As part of our continuing mission to provide you with exceptional heart care, we have created designated Provider Care Teams.  These Care Teams include your primary Cardiologist (physician) and Advanced Practice Providers (APPs -  Physician Assistants and Nurse Practitioners) who all work together to provide you with the care you need, when you need it.  Your next appointment:   1 year(s)  The format for your next appointment:   In Person  Provider:   You may see Lewayne Bunting, MD or one of the following Advanced Practice Providers on your designated Care Team:   Francis Dowse, [place] [address] [place], [place] Sherie Don, NP Canary Brim, NP{

## 2022-11-30 NOTE — Progress Notes (Signed)
HPI Mr. Dondiego returns today for followup. He is a pleasant 89 yo man with a h/o HTN, who developed advanced high grade AV block and underwent insertion of a DDD PM 3 months ago with a left bundle area DDD PM placed. He has preserved LV function by echo. In the interim he notes that he has done well with no syncope. He denies chest pain or sob. Minimal peripheral edema. No Known Allergies   Current Outpatient Medications  Medication Sig Dispense Refill   acetaminophen (TYLENOL) 325 MG tablet Take 1-2 tablets (325-650 mg total) by mouth every 4 (four) hours as needed for mild pain.     atorvastatin (LIPITOR) 10 MG tablet TAKE 1 TABLET BY MOUTH WEEKLY 15 tablet 2   BD PEN NEEDLE NANO 2ND GEN 32G X 4 MM MISC USE AS DIRECTED 100 each 1   finasteride (PROSCAR) 5 MG tablet TAKE 1 TABLET BY MOUTH EVERY DAY 90 tablet 3   glipiZIDE (GLUCOTROL XL) 5 MG 24 hr tablet TAKE 1 TABLET BY MOUTH EVERY DAY (Patient taking differently: Take 5 mg by mouth daily.) 90 tablet 1   HYDROcodone-acetaminophen (NORCO/VICODIN) 5-325 MG tablet Take 1-2 tablets by mouth every 6 (six) hours as needed.     insulin degludec (TRESIBA FLEXTOUCH) 100 UNIT/ML FlexTouch Pen INJECT 0.2-0.3 MLS (20-30 UNITS TOTAL) INTO THE SKIN DAILY. (Patient taking differently: Inject 14 Units into the skin daily.) 3 mL 3   losartan (COZAAR) 50 MG tablet Take 50 mg by mouth daily. Has been taking half of 100 mg for years- next prescription should be 50 mg     Misc Natural Products (GLUCOSAMINE CHOND CMP ADVANCED PO) Take by mouth in the morning and at bedtime.     ONE TOUCH LANCETS MISC Use to test blood sugars. Dx:E11.9 100 each 11   ONETOUCH VERIO test strip USE TO TEST BLOOD SUGARS DAILY. DX: E11.9 (Patient taking differently: 1 each by Other route as needed for other. USE TO TEST BLOOD SUGARS DAILY. DX: E11.9) 100 strip 12   tamsulosin (FLOMAX) 0.4 MG CAPS capsule TAKE 1 CAPSULE BY MOUTH  DAILY 90 capsule 3   timolol (TIMOPTIC) 0.5 %  ophthalmic solution 1 drop 2 (two) times daily.     TRAVATAN Z 0.004 % SOLN ophthalmic solution Place 1 drop into the left eye at bedtime.      vitamin B-12 (CYANOCOBALAMIN) 1000 MCG tablet Take 1,000 mcg by mouth daily.     No current facility-administered medications for this visit.     Past Medical History:  Diagnosis Date   Acute bronchitis 09/12/2007   Qualifier: Diagnosis of  By: Lovell Sheehan MD, [name]    Diabetes mellitus type 2 with complications (HCC) 1997   Glaucoma    left eye   Gout    Hyperlipidemia    Hypertension    Leukemia, chronic (HCC) 2001   does not take treatment just has blood levels checked once a year    ROS:   All systems reviewed and negative except as noted in the HPI.   Past Surgical History:  Procedure Laterality Date   ANTERIOR LAT LUMBAR FUSION Left 01/03/2013   Procedure: ANTERIOR LATERAL LUMBAR FUSION lumbar three-four;  Surgeon: Reinaldo Meeker, MD;  Location: [facility];  Service: Neurosurgery;  Laterality: Left;   BACK SURGERY     lumbar fusion - 2016, feb   COLONOSCOPY W/ POLYPECTOMY     DENTAL SURGERY     implanted teeth  x 2   HERNIA REPAIR  1989   LUMBAR LAMINECTOMY  1997   LUMBAR PERCUTANEOUS PEDICLE SCREW 1 LEVEL  01/03/2013   Procedure: LUMBAR PERCUTANEOUS PEDICLE SCREWS LUMBAR THREE-FOUR;  Surgeon: Reinaldo Meeker, MD;  Location: [facility];  Service: Neurosurgery;;   LUMBAR WOUND DEBRIDEMENT N/A 07/23/2014   Procedure: LUMBAR WOUND DEBRIDEMENT;  Surgeon: Aliene Beams, MD;  Location: [facility];  Service: Neurosurgery;  Laterality: N/A;   PACEMAKER IMPLANT N/A 08/22/2022   Procedure: PACEMAKER IMPLANT;  Surgeon: Marinus Maw, MD;  Location: [facility] INVASIVE CV LAB;  Service: Cardiovascular;  Laterality: N/A;   TONSILLECTOMY       Family History  Problem Relation Age of Onset   Heart disease Mother        72, second hand   Heart disease Father        43, former smoker     Social History   Socioeconomic History   Marital  status: Married    Spouse name: Not on file   Number of children: Not on file   Years of education: Not on file   Highest education level: Associate degree: occupational, Scientist, product/process development, or vocational program  Occupational History   Occupation: Retired     Comment: -Comptroller   Tobacco Use   Smoking status: Former    Current packs/day: 0.00    Average packs/day: 0.3 packs/day for 20.0 years (5.0 ttl pk-yrs)    Types: Cigarettes    Start date: 09/28/1975    Quit date: 09/28/1995    Years since quitting: 27.1   Smokeless tobacco: Never  Vaping Use   Vaping status: Never Used  Substance and Sexual Activity   Alcohol use: Not Currently    Alcohol/week: 1.0 standard drink of alcohol    Types: 1 Glasses of wine per week    Comment: rarely   Drug use: No   Sexual activity: Yes  Other Topics Concern   Not on file  Social History Narrative   Remarried. 2017. Widowed 2009. 2 sons. 8 grandkids. 3 greatgrands      Retired from [employer]       Hobbies: golf stopped(hard with back), watch tv (pain with moving around)   Social Determinants of Health   Financial Resource Strain: Low Risk  (11/28/2022)   Overall Financial Resource Strain (CARDIA)    Difficulty of Paying Living Expenses: Not hard at all  Food Insecurity: No Food Insecurity (11/28/2022)   Hunger Vital Sign    Worried About Running Out of Food in the Last Year: Never true    Ran Out of Food in the Last Year: Never true  Transportation Needs: No Transportation Needs (11/28/2022)   PRAPARE - Administrator, Civil Service (Medical): No    Lack of Transportation (Non-Medical): No  Physical Activity: Unknown (11/28/2022)   Exercise Vital Sign    Days of Exercise per Week: Patient declined    Minutes of Exercise per Session: Not on file  Stress: No Stress Concern Present (11/28/2022)   [employer] of Occupational Health - Occupational Stress Questionnaire    Feeling of Stress : Only a little  Social  Connections: Socially Integrated (11/28/2022)   Social Connection and Isolation Panel [NHANES]    Frequency of Communication with Friends and Family: Twice a week    Frequency of Social Gatherings with Friends and Family: Twice a week    Attends Religious Services: More than 4 times per year    Active Member  of Clubs or Organizations: Yes    Attends Banker Meetings: 1 to 4 times per year    Marital Status: Married  Catering manager Violence: Not At Risk (11/29/2021)   Humiliation, Afraid, Rape, and Kick questionnaire    Fear of Current or Ex-Partner: No    Emotionally Abused: No    Physically Abused: No    Sexually Abused: No     BP 138/72   Pulse 97   Ht 6' (1.829 m)   Wt 235 lb (106.6 kg)   SpO2 97%   BMI 31.87 kg/m   Physical Exam:  Well appearing NAD HEENT: Unremarkable Neck:  No JVD, no thyromegally Lymphatics:  No adenopathy Back:  No CVA tenderness Lungs:  Clear with no wheezes HEART:  Regular rate rhythm, no murmurs, no rubs, no clicks Abd:  soft, positive bowel sounds, no organomegally, no rebound, no guarding Ext:  2 plus pulses, no edema, no cyanosis, no clubbing Skin:  No rashes no nodules Neuro:  CN II through XII intact, motor grossly intact  DEVICE  Normal device function.  See PaceArt for details.   Assess/Plan: CHB - he is doing well s/p PPM insertion. PPM - he is s/p PPM insertion. His medtronic DDD PPM is working normally. We will recheck in a year. HTN - his bp is minimally elevated. He will continue his current meds. I encouraged him to maintain a low sodium diet.  [name] Keynan Heffern,MD

## 2022-12-02 ENCOUNTER — Ambulatory Visit (INDEPENDENT_AMBULATORY_CARE_PROVIDER_SITE_OTHER): Payer: Medicare Other | Admitting: Family Medicine

## 2022-12-02 ENCOUNTER — Other Ambulatory Visit: Payer: Self-pay | Admitting: Family Medicine

## 2022-12-02 ENCOUNTER — Encounter: Payer: Self-pay | Admitting: Family Medicine

## 2022-12-02 VITALS — BP 122/70 | HR 83 | Temp 97.9°F | Ht 72.0 in | Wt 235.0 lb

## 2022-12-02 DIAGNOSIS — H9191 Unspecified hearing loss, right ear: Secondary | ICD-10-CM | POA: Diagnosis not present

## 2022-12-02 DIAGNOSIS — H938X1 Other specified disorders of right ear: Secondary | ICD-10-CM | POA: Diagnosis not present

## 2022-12-02 DIAGNOSIS — I10 Essential (primary) hypertension: Secondary | ICD-10-CM | POA: Diagnosis not present

## 2022-12-02 MED ORDER — FLUTICASONE PROPIONATE 50 MCG/ACT NA SUSP: 2.0000 | Freq: Every day | NASAL | 1 refills | Status: DC

## 2022-12-02 NOTE — Patient Instructions (Addendum)
Try Flonase for the next month 2 sprays each nostril- it appears to me you have fluid behind the right ear drum that is causing the fullness and hearing loss  We have placed a referral for you today to ENT (my hope would be that you could be seen within a month). In some cases you will see # listed below- you can call this if you have not heard within a week. If you do not see # listed- you should receive a mychart message or phone call within a week with the # to call. Reach out to [place] if you ar enot scheduled within 2 weeks   Recommended follow up: Return for as needed for new, worsening, persistent symptoms. Also scheudle physical at least a year out form your last physical

## 2022-12-02 NOTE — Progress Notes (Signed)
Phone [phone] In person visit   Subjective:   Kevin Rogers is a 89 y.o. year old very pleasant male patient who presents for/with See problem oriented charting Chief Complaint  Patient presents with   Referral    Pt would like ENT referral for    Ear Fullness    Pt c/o ear fullness and decreased hearing after getting over a cold 3 weeks ago.   Past Medical History-  Patient Active Problem List   Diagnosis Date Noted   Complete heart block (HCC) 08/22/2022    Priority: High   Chronic lymphocytic leukemia (HCC) 03/08/2007    Priority: High   Diabetes mellitus type II, controlled (HCC) 03/08/2007    Priority: High   History of osteomyelitis of lumbar vertebrae     Priority: Medium    Hyperlipidemia     Priority: Medium    CKD (chronic kidney disease), stage III (HCC) 01/13/2015    Priority: Medium    Lumbar spinal stenosis 06/26/2014    Priority: Medium    BPH (benign prostatic hyperplasia) 06/24/2014    Priority: Medium    Gout 03/08/2007    Priority: Medium    Essential hypertension 03/08/2007    Priority: Medium    Enteritis due to Clostridium difficile     Priority: Low   Discitis of lumbar region     Priority: Low   B12 deficiency 12/12/2008    Priority: Low   Diabetic polyneuropathy (HCC) 12/12/2008    Priority: Low   ERECTILE DYSFUNCTION 03/18/2008    Priority: Low   Glaucoma 03/08/2007    Priority: Low   Osteoarthritis 03/08/2007    Priority: Low    Medications- reviewed and updated Current Outpatient Medications  Medication Sig Dispense Refill   acetaminophen (TYLENOL) 325 MG tablet Take 1-2 tablets (325-650 mg total) by mouth every 4 (four) hours as needed for mild pain.     atorvastatin (LIPITOR) 10 MG tablet TAKE 1 TABLET BY MOUTH WEEKLY 15 tablet 2   BD PEN NEEDLE NANO 2ND GEN 32G X 4 MM MISC USE AS DIRECTED 100 each 1   finasteride (PROSCAR) 5 MG tablet TAKE 1 TABLET BY MOUTH EVERY DAY 90 tablet 3   fluticasone (FLONASE) 50 MCG/ACT nasal  spray Place 2 sprays into both nostrils daily. 16 g 1   glipiZIDE (GLUCOTROL XL) 5 MG 24 hr tablet TAKE 1 TABLET BY MOUTH EVERY DAY (Patient taking differently: Take 5 mg by mouth daily.) 90 tablet 1   insulin degludec (TRESIBA FLEXTOUCH) 100 UNIT/ML FlexTouch Pen INJECT 0.2-0.3 MLS (20-30 UNITS TOTAL) INTO THE SKIN DAILY. (Patient taking differently: Inject 14 Units into the skin daily.) 3 mL 3   losartan (COZAAR) 50 MG tablet Take 50 mg by mouth daily. Has been taking half of 100 mg for years- next prescription should be 50 mg     Misc Natural Products (GLUCOSAMINE CHOND CMP ADVANCED PO) Take by mouth in the morning and at bedtime.     ONE TOUCH LANCETS MISC Use to test blood sugars. Dx:E11.9 100 each 11   ONETOUCH VERIO test strip USE TO TEST BLOOD SUGARS DAILY. DX: E11.9 (Patient taking differently: 1 each by Other route as needed for other. USE TO TEST BLOOD SUGARS DAILY. DX: E11.9) 100 strip 12   tamsulosin (FLOMAX) 0.4 MG CAPS capsule TAKE 1 CAPSULE BY MOUTH  DAILY 90 capsule 3   timolol (TIMOPTIC) 0.5 % ophthalmic solution 1 drop 2 (two) times daily.     TRAVATAN Z 0.004 %  SOLN ophthalmic solution Place 1 drop into the left eye at bedtime.      vitamin B-12 (CYANOCOBALAMIN) 1000 MCG tablet Take 1,000 mcg by mouth daily.     HYDROcodone-acetaminophen (NORCO/VICODIN) 5-325 MG tablet Take 1-2 tablets by mouth every 6 (six) hours as needed. (Patient not taking: Reported on 12/02/2022)     No current facility-administered medications for this visit.     Objective:  BP 122/70   Pulse 83   Temp 97.9 F (36.6 C)   Ht 6' (1.829 m)   Wt 235 lb (106.6 kg)   SpO2 97%   BMI 31.87 kg/m  Gen: NAD, resting comfortably Left tympanic membrane normal, right tympanic membrane appears to be slightly bulging with 1 air-fluid level noted in the anterior superior portion CV: RRR no murmurs rubs or gallops-of note patient has a pacemaker Lungs: CTAB no crackles, wheeze, rhonchi Abdomen:  soft/nontender/nondistended/normal bowel sounds. No rebound or guarding.  Ext: Trace to 1+ edema Skin: warm, dry     Assessment and Plan    # Ear fullness and hearing loss- mainly right ear S: Patient had a cold about 3 weeks ago and since that time has noted right ear fullness and decreased hearing.  Denies pain.  Some lingering nasal discharge mainly clear at times -yawns and notes ears popping. Autoinsfullation does the same thing.  A/P: Potential/suspected otitis media with effusion-we will treat with Flonase over 4-week period.  He is already contacted ENT and would like to be seen if this does not clear (both fullness and hearing loss)-since it can take some time to get in the ear nose and throat we went and placed a referral and discussed seeing them if no improvement within 4 weeks  #hypertension S: medication: Losartan 100 mg daily (half tablet due to potassium) BP Readings from Last 3 Encounters:  12/02/22 122/70  11/30/22 138/72  09/29/22 128/78  A/P: Hypertension-controlled. Continue current medications.  Recommended follow up: Return for as needed for new, worsening, persistent symptoms.  Also encouraged him to schedule a physical year out from his last physical Future Appointments  Date Time Provider Department Center  02/10/2023  1:15 PM [facility] None  02/10/2023  1:40 PM Briant Cedar, PA-C [facility] None  02/23/2023  7:05 AM [facility] DEVICE REMOTES [facility]  05/25/2023  7:05 AM [facility] DEVICE REMOTES [facility]  08/24/2023  7:05 AM [facility] DEVICE REMOTES [facility]  11/23/2023  7:05 AM [facility] DEVICE REMOTES [facility]  02/22/2024  7:05 AM [facility] DEVICE REMOTES [facility]  05/23/2024  7:05 AM [facility] DEVICE REMOTES [facility]  08/22/2024  7:05 AM [facility] DEVICE REMOTES [facility]    Lab/Order associations:   ICD-10-CM    1. Sensation of fullness in right ear  H93.8X1 Ambulatory referral to ENT    2. Essential hypertension  I10     3. Hearing loss of right ear, unspecified hearing loss type  H91.91 Ambulatory referral to ENT      Meds ordered this encounter  Medications   fluticasone (FLONASE) 50 MCG/ACT nasal spray    Sig: Place 2 sprays into both nostrils daily.    Dispense:  16 g    Refill:  1    Return precautions advised.  Tana Conch, MD

## 2022-12-05 NOTE — Progress Notes (Signed)
Remote pacemaker transmission.   

## 2022-12-06 ENCOUNTER — Ambulatory Visit (INDEPENDENT_AMBULATORY_CARE_PROVIDER_SITE_OTHER): Payer: Medicare Other

## 2022-12-06 VITALS — Wt 235.0 lb

## 2022-12-06 DIAGNOSIS — Z0000 Encounter for general adult medical examination without abnormal findings: Secondary | ICD-10-CM | POA: Diagnosis not present

## 2022-12-06 NOTE — Patient Instructions (Signed)
Kevin Rogers , Thank you for taking time to come for your Medicare Wellness Visit. I appreciate your ongoing commitment to your health goals. Please review the following plan we discussed and let me know if I can assist you in the future.   Referrals/Orders/Follow-Ups/Clinician Recommendations: stay healthy   This is a list of the screening recommended for you and due dates:  Health Maintenance  Topic Date Due   COVID-19 Vaccine (6 - 2023-24 season) 12/18/2022*   Flu Shot  07/31/2023*   Hemoglobin A1C  04/01/2023   Complete foot exam   09/29/2023   Eye exam for diabetics  11/08/2023   Medicare Annual Wellness Visit  12/06/2023   DTaP/Tdap/Td vaccine (3 - Td or Tdap) 09/20/2027   Pneumonia Vaccine  Completed   HPV Vaccine  Aged Out   Zoster (Shingles) Vaccine  Discontinued  *Topic was postponed. The date shown is not the original due date.    Advanced directives: (Copy Requested) Please bring a copy of your health care power of attorney and living will to the office to be added to your chart at your convenience.  Next Medicare Annual Wellness Visit scheduled for next year: Yes  Preventive Care 87 Years and Older, Male  Preventive care refers to lifestyle choices and visits with your health care provider that can promote health and wellness. What does preventive care include? A yearly physical exam. This is also called an annual well check. Dental exams once or twice a year. Routine eye exams. Ask your health care provider how often you should have your eyes checked. Personal lifestyle choices, including: Daily care of your teeth and gums. Regular physical activity. Eating a healthy diet. Avoiding tobacco and drug use. Limiting alcohol use. Practicing safe sex. Taking low doses of aspirin every day. Taking vitamin and mineral supplements as recommended by your health care provider. What happens during an annual well check? The services and screenings done by your health care  provider during your annual well check will depend on your age, overall health, lifestyle risk factors, and family history of disease. Counseling  Your health care provider may ask you questions about your: Alcohol use. Tobacco use. Drug use. Emotional well-being. Home and relationship well-being. Sexual activity. Eating habits. History of falls. Memory and ability to understand (cognition). Work and work Astronomer. Screening  You may have the following tests or measurements: Height, weight, and BMI. Blood pressure. Lipid and cholesterol levels. These may be checked every 5 years, or more frequently if you are over 87 years old. Skin check. Lung cancer screening. You may have this screening every year starting at age 87 if you have a 30-pack-year history of smoking and currently smoke or have quit within the past 15 years. Fecal occult blood test (FOBT) of the stool. You may have this test every year starting at age 87. Flexible sigmoidoscopy or colonoscopy. You may have a sigmoidoscopy every 5 years or a colonoscopy every 10 years starting at age 87. Prostate cancer screening. Recommendations will vary depending on your family history and other risks. Hepatitis C blood test. Hepatitis B blood test. Sexually transmitted disease (STD) testing. Diabetes screening. This is done by checking your blood sugar (glucose) after you have not eaten for a while (fasting). You may have this done every 1-3 years. Abdominal aortic aneurysm (AAA) screening. You may need this if you are a current or former smoker. Osteoporosis. You may be screened starting at age 87 if you are at high risk. Talk with  your health care provider about your test results, treatment options, and if necessary, the need for more tests. Vaccines  Your health care provider may recommend certain vaccines, such as: Influenza vaccine. This is recommended every year. Tetanus, diphtheria, and acellular pertussis (Tdap, Td)  vaccine. You may need a Td booster every 10 years. Zoster vaccine. You may need this after age 87. Pneumococcal 13-valent conjugate (PCV13) vaccine. One dose is recommended after age 87. Pneumococcal polysaccharide (PPSV23) vaccine. One dose is recommended after age 87. Talk to your health care provider about which screenings and vaccines you need and how often you need them. This information is not intended to replace advice given to you by your health care provider. Make sure you discuss any questions you have with your health care provider. Document Released: 05/15/2015 Document Revised: 01/06/2016 Document Reviewed: 02/17/2015 Elsevier Interactive Patient Education  2017 [employer].  Fall Prevention in the Home Falls can cause injuries. They can happen to people of all ages. There are many things you can do to make your home safe and to help prevent falls. What can I do on the outside of my home? Regularly fix the edges of walkways and driveways and fix any cracks. Remove anything that might make you trip as you walk through a door, such as a raised step or threshold. Trim any bushes or trees on the path to your home. Use bright outdoor lighting. Clear any walking paths of anything that might make someone trip, such as rocks or tools. Regularly check to see if handrails are loose or broken. Make sure that both sides of any steps have handrails. Any raised decks and porches should have guardrails on the edges. Have any leaves, snow, or ice cleared regularly. Use sand or salt on walking paths during winter. Clean up any spills in your garage right away. This includes oil or grease spills. What can I do in the bathroom? Use night lights. Install grab bars by the toilet and in the tub and shower. Do not use towel bars as grab bars. Use non-skid mats or decals in the tub or shower. If you need to sit down in the shower, use a plastic, non-slip stool. Keep the floor dry. Clean up any  water that spills on the floor as soon as it happens. Remove soap buildup in the tub or shower regularly. Attach bath mats securely with double-sided non-slip rug tape. Do not have throw rugs and other things on the floor that can make you trip. What can I do in the bedroom? Use night lights. Make sure that you have a light by your bed that is easy to reach. Do not use any sheets or blankets that are too big for your bed. They should not hang down onto the floor. Have a firm chair that has side arms. You can use this for support while you get dressed. Do not have throw rugs and other things on the floor that can make you trip. What can I do in the kitchen? Clean up any spills right away. Avoid walking on wet floors. Keep items that you use a lot in easy-to-reach places. If you need to reach something above you, use a strong step stool that has a grab bar. Keep electrical cords out of the way. Do not use floor polish or wax that makes floors slippery. If you must use wax, use non-skid floor wax. Do not have throw rugs and other things on the floor that can make you trip.  What can I do with my stairs? Do not leave any items on the stairs. Make sure that there are handrails on both sides of the stairs and use them. Fix handrails that are broken or loose. Make sure that handrails are as long as the stairways. Check any carpeting to make sure that it is firmly attached to the stairs. Fix any carpet that is loose or worn. Avoid having throw rugs at the top or bottom of the stairs. If you do have throw rugs, attach them to the floor with carpet tape. Make sure that you have a light switch at the top of the stairs and the bottom of the stairs. If you do not have them, ask someone to add them for you. What else can I do to help prevent falls? Wear shoes that: Do not have high heels. Have rubber bottoms. Are comfortable and fit you well. Are closed at the toe. Do not wear sandals. If you use a  stepladder: Make sure that it is fully opened. Do not climb a closed stepladder. Make sure that both sides of the stepladder are locked into place. Ask someone to hold it for you, if possible. Clearly mark and make sure that you can see: Any grab bars or handrails. First and last steps. Where the edge of each step is. Use tools that help you move around (mobility aids) if they are needed. These include: Canes. Walkers. Scooters. Crutches. Turn on the lights when you go into a dark area. Replace any light bulbs as soon as they burn out. Set up your furniture so you have a clear path. Avoid moving your furniture around. If any of your floors are uneven, fix them. If there are any pets around you, be aware of where they are. Review your medicines with your doctor. Some medicines can make you feel dizzy. This can increase your chance of falling. Ask your doctor what other things that you can do to help prevent falls. This information is not intended to replace advice given to you by your health care provider. Make sure you discuss any questions you have with your health care provider. Document Released: 02/12/2009 Document Revised: 09/24/2015 Document Reviewed: 05/23/2014 Elsevier Interactive Patient Education  2017 [employer].

## 2022-12-06 NOTE — Progress Notes (Signed)
Subjective:   Kevin Rogers is a 89 y.o. male who presents for Medicare Annual/Subsequent preventive examination.  Visit Complete: Virtual  I connected with  Kevin Rogers on 12/06/22 by a audio enabled telemedicine application and verified that I am speaking with the correct person using two identifiers.  Patient Location: Home  Provider Location: Office/Clinic  I discussed the limitations of evaluation and management by telemedicine. The patient expressed understanding and agreed to proceed.  Patient Medicare AWV questionnaire was completed by the patient on 12/05/22; I have confirmed that all information answered by patient is correct and no changes since this date.   Vital Signs: Unable to obtain new vitals due to this being a telehealth visit.   Review of Systems     Cardiac Risk Factors include: advanced age (>21men, >19 women);diabetes mellitus;dyslipidemia;male gender;hypertension;obesity (BMI >30kg/m2)     Objective:    Today's Vitals   12/06/22 1534  Weight: 235 lb (106.6 kg)   Body mass index is 31.87 kg/m.     12/06/2022    3:38 PM 08/22/2022   12:28 PM 11/29/2021    2:58 PM 11/13/2020    2:34 PM 02/07/2020    1:08 PM 09/12/2019    8:37 AM 07/17/2019    2:09 PM  Advanced Directives  Does Patient Have a Medical Advance Directive? Yes No Yes Yes Yes Yes No  Type of Estate agent of [place];Living will  Healthcare Power of Attorney Living will Living will Living will;Healthcare Power of Attorney   Does patient want to make changes to medical advance directive?     No - Patient declined No - Patient declined   Copy of Healthcare Power of Attorney in Chart? No - copy requested  No - copy requested   No - copy requested   Would patient like information on creating a medical advance directive?     No - Patient declined      Current Medications (verified) Outpatient Encounter Medications as of 12/06/2022  Medication Sig   acetaminophen (TYLENOL) 325  MG tablet Take 1-2 tablets (325-650 mg total) by mouth every 4 (four) hours as needed for mild pain.   atorvastatin (LIPITOR) 10 MG tablet TAKE 1 TABLET BY MOUTH WEEKLY   BD PEN NEEDLE NANO 2ND GEN 32G X 4 MM MISC USE AS DIRECTED   finasteride (PROSCAR) 5 MG tablet TAKE 1 TABLET BY MOUTH EVERY DAY   fluticasone (FLONASE) 50 MCG/ACT nasal spray Place 2 sprays into both nostrils daily.   glipiZIDE (GLUCOTROL XL) 5 MG 24 hr tablet TAKE 1 TABLET BY MOUTH EVERY DAY (Patient taking differently: Take 5 mg by mouth daily.)   insulin degludec (TRESIBA FLEXTOUCH) 100 UNIT/ML FlexTouch Pen INJECT 0.2-0.3 MLS (20-30 UNITS TOTAL) INTO THE SKIN DAILY. (Patient taking differently: Inject 14 Units into the skin daily.)   losartan (COZAAR) 50 MG tablet Take 50 mg by mouth daily. Has been taking half of 100 mg for years- next prescription should be 50 mg   Misc Natural Products (GLUCOSAMINE CHOND CMP ADVANCED PO) Take by mouth in the morning and at bedtime.   ONE TOUCH LANCETS MISC Use to test blood sugars. Dx:E11.9   ONETOUCH VERIO test strip USE TO TEST BLOOD SUGARS DAILY. DX: E11.9 (Patient taking differently: 1 each by Other route as needed for other. USE TO TEST BLOOD SUGARS DAILY. DX: E11.9)   tamsulosin (FLOMAX) 0.4 MG CAPS capsule TAKE 1 CAPSULE BY MOUTH  DAILY   timolol (TIMOPTIC) 0.5 % ophthalmic solution  1 drop 2 (two) times daily.   TRAVATAN Z 0.004 % SOLN ophthalmic solution Place 1 drop into the left eye at bedtime.    vitamin B-12 (CYANOCOBALAMIN) 1000 MCG tablet Take 1,000 mcg by mouth daily.   [DISCONTINUED] HYDROcodone-acetaminophen (NORCO/VICODIN) 5-325 MG tablet Take 1-2 tablets by mouth every 6 (six) hours as needed. (Patient not taking: Reported on 12/02/2022)   No facility-administered encounter medications on file as of 12/06/2022.    Allergies (verified) Patient has no known allergies.   History: Past Medical History:  Diagnosis Date   Acute bronchitis 09/12/2007   Qualifier: Diagnosis  of  By: Lovell Sheehan MD, [name]    Diabetes mellitus type 2 with complications (HCC) 1997   Glaucoma    left eye   Gout    Hyperlipidemia    Hypertension    Leukemia, chronic (HCC) 2001   does not take treatment just has blood levels checked once a year   Past Surgical History:  Procedure Laterality Date   ANTERIOR LAT LUMBAR FUSION Left 01/03/2013   Procedure: ANTERIOR LATERAL LUMBAR FUSION lumbar three-four;  Surgeon: Reinaldo Meeker, MD;  Location: [facility];  Service: Neurosurgery;  Laterality: Left;   BACK SURGERY     lumbar fusion - 2016, feb   COLONOSCOPY W/ POLYPECTOMY     DENTAL SURGERY     implanted teeth x 2   HERNIA REPAIR  1989   LUMBAR LAMINECTOMY  1997   LUMBAR PERCUTANEOUS PEDICLE SCREW 1 LEVEL  01/03/2013   Procedure: LUMBAR PERCUTANEOUS PEDICLE SCREWS LUMBAR THREE-FOUR;  Surgeon: Reinaldo Meeker, MD;  Location: [facility];  Service: Neurosurgery;;   LUMBAR WOUND DEBRIDEMENT N/A 07/23/2014   Procedure: LUMBAR WOUND DEBRIDEMENT;  Surgeon: Aliene Beams, MD;  Location: [facility];  Service: Neurosurgery;  Laterality: N/A;   PACEMAKER IMPLANT N/A 08/22/2022   Procedure: PACEMAKER IMPLANT;  Surgeon: Marinus Maw, MD;  Location: [facility];  Service: Cardiovascular;  Laterality: N/A;   TONSILLECTOMY     Family History  Problem Relation Age of Onset   Heart disease Mother        92, second hand   Heart disease Father        61, former smoker   Social History   Socioeconomic History   Marital status: Married    Spouse name: Not on file   Number of children: Not on file   Years of education: Not on file   Highest education level: Associate degree: occupational, Scientist, product/process development, or vocational program  Occupational History   Occupation: Retired     Comment: -Comptroller   Tobacco Use   Smoking status: Former    Current packs/day: 0.00    Average packs/day: 0.3 packs/day for 20.0 years (5.0 ttl pk-yrs)    Types: Cigarettes    Start date: 09/28/1975     Quit date: 09/28/1995    Years since quitting: 27.2   Smokeless tobacco: Never  Vaping Use   Vaping status: Never Used  Substance and Sexual Activity   Alcohol use: Not Currently    Alcohol/week: 1.0 standard drink of alcohol    Types: 1 Glasses of wine per week    Comment: rarely   Drug use: No   Sexual activity: Yes  Other Topics Concern   Not on file  Social History Narrative   Remarried. 2017. Widowed 2009. 2 sons. 8 grandkids. 3 greatgrands      Retired from [employer]  Hobbies: golf stopped(hard with back), watch tv (pain with moving around)   Social Determinants of Health   Financial Resource Strain: Low Risk  (12/05/2022)   Overall Financial Resource Strain (CARDIA)    Difficulty of Paying Living Expenses: Not very hard  Food Insecurity: No Food Insecurity (12/05/2022)   Hunger Vital Sign    Worried About Running Out of Food in the Last Year: Never true    Ran Out of Food in the Last Year: Never true  Transportation Needs: No Transportation Needs (12/05/2022)   PRAPARE - Administrator, Civil Service (Medical): No    Lack of Transportation (Non-Medical): No  Physical Activity: Insufficiently Active (12/05/2022)   Exercise Vital Sign    Days of Exercise per Week: 1 day    Minutes of Exercise per Session: 10 min  Stress: No Stress Concern Present (12/05/2022)   [employer] of Occupational Health - Occupational Stress Questionnaire    Feeling of Stress : Not at all  Social Connections: Socially Integrated (12/05/2022)   Social Connection and Isolation Panel [NHANES]    Frequency of Communication with Friends and Family: Twice a week    Frequency of Social Gatherings with Friends and Family: Once a week    Attends Religious Services: More than 4 times per year    Active Member of [employer] or Organizations: Yes    Attends Banker Meetings: 1 to 4 times per year    Marital Status: Married    Tobacco Counseling Counseling given: Not  Answered   Clinical Intake:  Pre-visit preparation completed: Yes  Pain : No/denies pain     BMI - recorded: 31.87 Nutritional Status: BMI > 30  Obese Nutritional Risks: None Diabetes: Yes CBG done?: Yes (116 per pt) CBG resulted in Enter/ Edit results?: No Did pt. bring in CBG monitor from home?: No  How often do you need to have someone help you when you read instructions, pamphlets, or other written materials from your doctor or pharmacy?: 2 - Rarely  Interpreter Needed?: No  Information entered by :: Lanier Ensign, LPN   Activities of Daily Living    12/05/2022    8:45 PM  In your present state of health, do you have any difficulty performing the following activities:  Hearing? 0  Vision? 0  Difficulty concentrating or making decisions? 0  Walking or climbing stairs? 1  Dressing or bathing? 0  Doing errands, shopping? 0  Preparing Food and eating ? N  Using the Toilet? N  In the past six months, have you accidently leaked urine? N  Do you have problems with loss of bowel control? N  Managing your Medications? N  Managing your Finances? N  Housekeeping or managing your Housekeeping? Y    Patient Care Team: Shelva Majestic, MD as PCP - General (Family Medicine) Marinus Maw, MD as PCP - Electrophysiology (Cardiology) Aliene Beams, MD as Consulting Physician (Neurosurgery) Loletha Carrow, MD as Consulting Physician (Ophthalmology) Benjiman Core, MD (Inactive) as Consulting Physician (Oncology) Callie Fielding, MD as Consulting Physician (Physical Medicine and Rehabilitation) Vivi Barrack, DPM as Consulting Physician (Podiatry) Earlene Plater, Rita Ohara, [facility] (Inactive) (Pharmacist) Lbcardiology, Rounding, MD as Rounding Team (Cardiology)  Indicate any recent Medical Services you may have received from other than [facility] providers in the past year (date may be approximate).     Assessment:   This is a routine wellness examination for  [place].  Hearing/Vision screen Hearing Screening - Comments:: Pt denies any  hearing issues  Vision Screening - Comments:: Pt follows up with Dr Burgess Estelle for annual eye exams   Dietary issues and exercise activities discussed:     Goals Addressed             This Visit's Progress    Patient Stated       Stay healthy        Depression Screen    12/06/2022    3:39 PM 05/05/2022   11:04 AM 11/29/2021    2:57 PM 02/08/2021    2:46 PM 11/13/2020    2:33 PM 08/07/2020    3:08 PM 07/22/2019    9:38 AM  PHQ 2/9 Scores  PHQ - 2 Score 0 0 0 0 0 0 0  PHQ- 9 Score      0 0    Fall Risk    12/05/2022    8:45 PM 03/29/2022    8:49 AM 11/29/2021    2:59 PM 02/08/2021    2:45 PM 11/13/2020    2:35 PM  Fall Risk   Falls in the past year? 1 1 0 1 1  Number falls in past yr: 1 1 0 1 1  Injury with Fall? 0 0 0 1 1  Comment     skinned left knee  Risk for fall due to : Impaired vision;History of fall(s) History of fall(s) Impaired vision;Impaired balance/gait Impaired balance/gait;Impaired mobility Impaired vision;Impaired balance/gait;Impaired mobility  Follow up Falls prevention discussed Falls evaluation completed Falls prevention discussed Falls evaluation completed Falls prevention discussed    MEDICARE RISK AT HOME:   TIMED UP AND GO:  Was the test performed?  No    Cognitive Function:        12/06/2022    3:42 PM 11/29/2021    3:01 PM 11/13/2020    2:37 PM 09/12/2019    8:37 AM 01/16/2019    3:27 PM  6CIT Screen  What Year? 0 points 0 points 4 points 0 points 0 points  What month? 0 points 0 points 0 points 0 points 0 points  What time? 0 points 0 points 0 points 0 points 0 points  Count back from 20 0 points 0 points 0 points 0 points 0 points  Months in reverse 0 points 0 points 0 points 0 points 0 points  Repeat phrase 0 points 6 points 0 points 0 points 0 points  Total Score 0 points 6 points 4 points 0 points 0 points    Immunizations Immunization History   Administered Date(s) Administered   Fluad Quad(high Dose 65+) 01/02/2019, 02/27/2020, 02/08/2021, 02/09/2022   Influenza Split 02/10/2011, 01/11/2012   Influenza Whole 03/15/2007, 01/31/2008, 02/04/2009, 02/01/2010   Influenza, High Dose Seasonal PF 01/18/2016, 01/24/2017, 01/29/2018   Influenza,inj,Quad PF,6+ Mos 02/12/2014, 01/13/2015   Moderna Covid-19 Vaccine Bivalent Booster 85yrs & up 02/09/2022   Moderna Sars-Covid-2 Vaccination 05/24/2019, 06/10/2019, 02/29/2020, 08/06/2020   Pneumococcal Conjugate-13 03/23/2015   Pneumococcal Polysaccharide-23 03/24/2009, 02/12/2014   Td 03/09/2007   Tdap 09/19/2017    TDAP status: Up to date  Flu Vaccine status: Due, Education has been provided regarding the importance of this vaccine. Advised may receive this vaccine at local pharmacy or Health Dept. Aware to provide a copy of the vaccination record if obtained from local pharmacy or Health Dept. Verbalized acceptance and understanding.  Pneumococcal vaccine status: Up to date  Covid-19 vaccine status: Information provided on how to obtain vaccines.   Qualifies for Shingles Vaccine? No  medical decision   Screening Tests  Health Maintenance  Topic Date Due   COVID-19 Vaccine (6 - 2023-24 season) 12/18/2022 (Originally 04/06/2022)   INFLUENZA VACCINE  07/31/2023 (Originally 12/01/2022)   HEMOGLOBIN A1C  04/01/2023   FOOT EXAM  09/29/2023   OPHTHALMOLOGY EXAM  11/08/2023   Medicare Annual Wellness (AWV)  12/06/2023   DTaP/Tdap/Td (3 - Td or Tdap) 09/20/2027   Pneumonia Vaccine 51+ Years old  Completed   HPV VACCINES  Aged Out   Zoster Vaccines- Shingrix  Discontinued    Health Maintenance  There are no preventive care reminders to display for this patient.   Colorectal cancer screening: No longer required.   Additional Screening:    Vision Screening: Recommended annual ophthalmology exams for early detection of glaucoma and other disorders of the eye. Is the patient up to  date with their annual eye exam?  Yes  Who is the provider or what is the name of the office in which the patient attends annual eye exams? Dr Burgess Estelle  If pt is not established with a provider, would they like to be referred to a provider to establish care? No .   Dental Screening: Recommended annual dental exams for proper oral hygiene    Community Resource Referral / Chronic Care Management: CRR required this visit?  No   CCM required this visit?  No     Plan:     I have personally reviewed and noted the following in the patient's chart:   Medical and social history Use of alcohol, tobacco or illicit drugs  Current medications and supplements including opioid prescriptions. Patient is not currently taking opioid prescriptions. Functional ability and status Nutritional status Physical activity Advanced directives List of other physicians Hospitalizations, surgeries, and ER visits in previous 12 months Vitals Screenings to include cognitive, depression, and falls Referrals and appointments  In addition, I have reviewed and discussed with patient certain preventive protocols, quality metrics, and best practice recommendations. A written personalized care plan for preventive services as well as general preventive health recommendations were provided to patient.     Marzella Schlein, LPN   12/03/6960   After Visit Summary: (MyChart) Due to this being a telephonic visit, the after visit summary with patients personalized plan was offered to patient via MyChart   Nurse Notes: none

## 2022-12-08 ENCOUNTER — Other Ambulatory Visit: Payer: Self-pay | Admitting: Family Medicine

## 2022-12-14 ENCOUNTER — Other Ambulatory Visit: Payer: Self-pay | Admitting: Family Medicine

## 2023-01-10 DIAGNOSIS — M5416 Radiculopathy, lumbar region: Secondary | ICD-10-CM | POA: Diagnosis not present

## 2023-01-10 DIAGNOSIS — M7918 Myalgia, other site: Secondary | ICD-10-CM | POA: Diagnosis not present

## 2023-01-10 DIAGNOSIS — M961 Postlaminectomy syndrome, not elsewhere classified: Secondary | ICD-10-CM | POA: Diagnosis not present

## 2023-01-26 ENCOUNTER — Other Ambulatory Visit: Payer: Self-pay | Admitting: Family Medicine

## 2023-01-30 ENCOUNTER — Other Ambulatory Visit: Payer: Self-pay | Admitting: Family Medicine

## 2023-02-07 ENCOUNTER — Other Ambulatory Visit: Payer: Medicare Other

## 2023-02-07 ENCOUNTER — Ambulatory Visit: Payer: Medicare Other | Admitting: Physician Assistant

## 2023-02-09 ENCOUNTER — Other Ambulatory Visit: Payer: Self-pay | Admitting: Physician Assistant

## 2023-02-09 ENCOUNTER — Other Ambulatory Visit: Payer: Self-pay | Admitting: Family Medicine

## 2023-02-09 DIAGNOSIS — C9110 Chronic lymphocytic leukemia of B-cell type not having achieved remission: Secondary | ICD-10-CM

## 2023-02-10 ENCOUNTER — Inpatient Hospital Stay (HOSPITAL_BASED_OUTPATIENT_CLINIC_OR_DEPARTMENT_OTHER): Payer: Medicare Other | Admitting: Physician Assistant

## 2023-02-10 ENCOUNTER — Inpatient Hospital Stay: Payer: Medicare Other | Attending: Physician Assistant

## 2023-02-10 VITALS — BP 133/82 | HR 76 | Temp 97.6°F | Resp 17 | Ht 72.0 in | Wt 236.1 lb

## 2023-02-10 DIAGNOSIS — Z87891 Personal history of nicotine dependence: Secondary | ICD-10-CM | POA: Insufficient documentation

## 2023-02-10 DIAGNOSIS — C9110 Chronic lymphocytic leukemia of B-cell type not having achieved remission: Secondary | ICD-10-CM | POA: Insufficient documentation

## 2023-02-10 LAB — CBC WITH DIFFERENTIAL (CANCER CENTER ONLY)
Abs Immature Granulocytes: 0.02 10*3/uL (ref 0.00–0.07)
Basophils Absolute: 0 10*3/uL (ref 0.0–0.1)
Basophils Relative: 0 %
Eosinophils Absolute: 0.1 10*3/uL (ref 0.0–0.5)
Eosinophils Relative: 1 %
HCT: 39.1 % (ref 39.0–52.0)
Hemoglobin: 12.6 g/dL — ABNORMAL LOW (ref 13.0–17.0)
Immature Granulocytes: 0 %
Lymphocytes Relative: 49 %
Lymphs Abs: 4.3 10*3/uL — ABNORMAL HIGH (ref 0.7–4.0)
MCH: 29.4 pg (ref 26.0–34.0)
MCHC: 32.2 g/dL (ref 30.0–36.0)
MCV: 91.4 fL (ref 80.0–100.0)
Monocytes Absolute: 0.3 10*3/uL (ref 0.1–1.0)
Monocytes Relative: 4 %
Neutro Abs: 3.9 10*3/uL (ref 1.7–7.7)
Neutrophils Relative %: 46 %
Platelet Count: 157 10*3/uL (ref 150–400)
RBC: 4.28 MIL/uL (ref 4.22–5.81)
RDW: 13.3 % (ref 11.5–15.5)
WBC Count: 8.6 10*3/uL (ref 4.0–10.5)
nRBC: 0 % (ref 0.0–0.2)

## 2023-02-10 LAB — CMP (CANCER CENTER ONLY)
ALT: 11 U/L (ref 0–44)
AST: 15 U/L (ref 15–41)
Albumin: 3.9 g/dL (ref 3.5–5.0)
Alkaline Phosphatase: 76 U/L (ref 38–126)
Anion gap: 5 (ref 5–15)
BUN: 26 mg/dL — ABNORMAL HIGH (ref 8–23)
CO2: 27 mmol/L (ref 22–32)
Calcium: 9.6 mg/dL (ref 8.9–10.3)
Chloride: 110 mmol/L (ref 98–111)
Creatinine: 1.62 mg/dL — ABNORMAL HIGH (ref 0.61–1.24)
GFR, Estimated: 41 mL/min — ABNORMAL LOW (ref >60)
Glucose, Bld: 151 mg/dL — ABNORMAL HIGH (ref 70–99)
Potassium: 4.9 mmol/L (ref 3.5–5.1)
Sodium: 142 mmol/L (ref 135–145)
Total Bilirubin: 0.9 mg/dL (ref 0.3–1.2)
Total Protein: 6.5 g/dL (ref 6.5–8.1)

## 2023-02-10 LAB — LACTATE DEHYDROGENASE: LDH: 130 U/L (ref 98–192)

## 2023-02-10 NOTE — Progress Notes (Signed)
[facility] [phone]   [phone]  PROGRESS NOTE  Patient Care Team: Shelva Majestic, MD as PCP - General (Family Medicine) Marinus Maw, MD as PCP - Electrophysiology (Cardiology) Aliene Beams, MD as Consulting Physician (Neurosurgery) Loletha Carrow, MD as Consulting Physician (Ophthalmology) Callie Fielding, MD as Consulting Physician (Physical Medicine and Rehabilitation) Vivi Barrack, DPM as Consulting Physician (Podiatry) Earlene Plater, Rita Ohara, [facility] (Inactive) (Pharmacist) Lbcardiology, Rounding, MD as Rounding Team (Cardiology) Jaci Standard, MD as Consulting Physician (Hematology and Oncology)  CHIEF COMPLAINTS/PURPOSE OF CONSULTATION:  Chronic lymphocytic leukemia, diagnosed in 2007.   HISTORY OF PRESENTING ILLNESS:  Kevin Rogers 89 y.o. male returns for a follow up for CLL. He was last seen by Dr. Clelia Croft on 02/04/2022. In the interim, he continues on active surveillance. He presents today to transfer care to Dr. Leonides Schanz.   On exam today, Kevin Rogers reports he is doing well without any changes to his health. He reports his appetite and energy levels are stable. He is able to complete his baseline ADLs. He uses a walker to help with ambulation. He denies nausea, vomiting or bowel habit changes. He denies easy bruising or signs of bleeding. He denies fevers, chills, sweats, shortness of breath, chest pain or cough.   MEDICAL HISTORY:  Past Medical History:  Diagnosis Date   Acute bronchitis 09/12/2007   Qualifier: Diagnosis of  By: Lovell Sheehan MD, [name]    Diabetes mellitus type 2 with complications (HCC) 1997   Glaucoma    left eye   Gout    Hyperlipidemia    Hypertension    Leukemia, chronic (HCC) 2001   does not take treatment just has blood levels checked once a year    SURGICAL HISTORY: Past Surgical History:  Procedure Laterality Date   ANTERIOR LAT LUMBAR FUSION Left 01/03/2013   Procedure: ANTERIOR LATERAL LUMBAR  FUSION lumbar three-four;  Surgeon: Reinaldo Meeker, MD;  Location: [facility];  Service: Neurosurgery;  Laterality: Left;   BACK SURGERY     lumbar fusion - 2016, feb   COLONOSCOPY W/ POLYPECTOMY     DENTAL SURGERY     implanted teeth x 2   HERNIA REPAIR  1989   LUMBAR LAMINECTOMY  1997   LUMBAR PERCUTANEOUS PEDICLE SCREW 1 LEVEL  01/03/2013   Procedure: LUMBAR PERCUTANEOUS PEDICLE SCREWS LUMBAR THREE-FOUR;  Surgeon: Reinaldo Meeker, MD;  Location: [facility];  Service: Neurosurgery;;   LUMBAR WOUND DEBRIDEMENT N/A 07/23/2014   Procedure: LUMBAR WOUND DEBRIDEMENT;  Surgeon: Aliene Beams, MD;  Location: [facility];  Service: Neurosurgery;  Laterality: N/A;   PACEMAKER IMPLANT N/A 08/22/2022   Procedure: PACEMAKER IMPLANT;  Surgeon: Marinus Maw, MD;  Location: [facility] INVASIVE CV LAB;  Service: Cardiovascular;  Laterality: N/A;   TONSILLECTOMY      SOCIAL HISTORY: Social History   Socioeconomic History   Marital status: Married    Spouse name: Not on file   Number of children: Not on file   Years of education: Not on file   Highest education level: Associate degree: occupational, Scientist, product/process development, or vocational program  Occupational History   Occupation: Retired     Comment: -Comptroller   Tobacco Use   Smoking status: Former    Current packs/day: 0.00    Average packs/day: 0.3 packs/day for 20.0 years (5.0 ttl pk-yrs)    Types: Cigarettes    Start date: 09/28/1975    Quit date: 09/28/1995    Years  since quitting: 27.3   Smokeless tobacco: Never  Vaping Use   Vaping status: Never Used  Substance and Sexual Activity   Alcohol use: Not Currently    Alcohol/week: 1.0 standard drink of alcohol    Types: 1 Glasses of wine per week    Comment: rarely   Drug use: No   Sexual activity: Yes  Other Topics Concern   Not on file  Social History Narrative   Remarried. 2017. Widowed 2009. 2 sons. 8 grandkids. 3 greatgrands      Retired from [employer]       Hobbies: golf  stopped(hard with back), watch tv (pain with moving around)   Social Determinants of Health   Financial Resource Strain: Low Risk  (12/05/2022)   Overall Financial Resource Strain (CARDIA)    Difficulty of Paying Living Expenses: Not very hard  Food Insecurity: No Food Insecurity (12/05/2022)   Hunger Vital Sign    Worried About Running Out of Food in the Last Year: Never true    Ran Out of Food in the Last Year: Never true  Transportation Needs: No Transportation Needs (12/05/2022)   PRAPARE - Administrator, Civil Service (Medical): No    Lack of Transportation (Non-Medical): No  Physical Activity: Insufficiently Active (12/05/2022)   Exercise Vital Sign    Days of Exercise per Week: 1 day    Minutes of Exercise per Session: 10 min  Stress: No Stress Concern Present (12/05/2022)   [employer] of Occupational Health - Occupational Stress Questionnaire    Feeling of Stress : Not at all  Social Connections: Socially Integrated (12/05/2022)   Social Connection and Isolation Panel [NHANES]    Frequency of Communication with Friends and Family: Twice a week    Frequency of Social Gatherings with Friends and Family: Once a week    Attends Religious Services: More than 4 times per year    Active Member of [employer] or Organizations: Yes    Attends Banker Meetings: 1 to 4 times per year    Marital Status: Married  Catering manager Violence: Not At Risk (12/06/2022)   Humiliation, Afraid, Rape, and Kick questionnaire    Fear of Current or Ex-Partner: No    Emotionally Abused: No    Physically Abused: No    Sexually Abused: No    FAMILY HISTORY: Family History  Problem Relation Age of Onset   Heart disease Mother        66, second hand   Heart disease Father        62, former smoker    ALLERGIES:  has No Known Allergies.  MEDICATIONS:  Current Outpatient Medications  Medication Sig Dispense Refill   acetaminophen (TYLENOL) 325 MG tablet Take 1-2 tablets  (325-650 mg total) by mouth every 4 (four) hours as needed for mild pain.     atorvastatin (LIPITOR) 10 MG tablet TAKE 1 TABLET BY MOUTH WEEKLY 15 tablet 2   BD PEN NEEDLE NANO 2ND GEN 32G X 4 MM MISC USE AS DIRECTED 100 each 1   finasteride (PROSCAR) 5 MG tablet TAKE 1 TABLET BY MOUTH EVERY DAY 90 tablet 3   fluticasone (FLONASE) 50 MCG/ACT nasal spray SPRAY 2 SPRAYS INTO EACH NOSTRIL EVERY DAY 16 mL 1   glipiZIDE (GLUCOTROL XL) 5 MG 24 hr tablet TAKE 1 TABLET BY MOUTH EVERY DAY 90 tablet 1   glucose blood (ONETOUCH VERIO) test strip USE TO TEST BLOOD SUGARS DAILY 100 strip 12  HYDROcodone-acetaminophen (NORCO) 7.5-325 MG tablet Take 1 tablet by mouth 2 (two) times daily.     insulin degludec (TRESIBA FLEXTOUCH) 100 UNIT/ML FlexTouch Pen INJECT 0.2-0.3 MLS (20-30 UNITS TOTAL) INTO THE SKIN DAILY. (Patient taking differently: Inject 14 Units into the skin daily.) 3 mL 3   losartan (COZAAR) 50 MG tablet Take 50 mg by mouth daily. Has been taking half of 100 mg for years- next prescription should be 50 mg     Misc Natural Products (GLUCOSAMINE CHOND CMP ADVANCED PO) Take by mouth in the morning and at bedtime.     ONE TOUCH LANCETS MISC Use to test blood sugars. Dx:E11.9 100 each 11   tamsulosin (FLOMAX) 0.4 MG CAPS capsule TAKE 1 CAPSULE BY MOUTH DAILY 100 capsule 2   timolol (TIMOPTIC) 0.5 % ophthalmic solution 1 drop 2 (two) times daily.     TRAVATAN Z 0.004 % SOLN ophthalmic solution Place 1 drop into the left eye at bedtime.      vitamin B-12 (CYANOCOBALAMIN) 1000 MCG tablet Take 1,000 mcg by mouth daily.     No current facility-administered medications for this visit.    REVIEW OF SYSTEMS:   Constitutional: ( - ) fevers, ( - )  chills , ( - ) night sweats Eyes: ( - ) blurriness of vision, ( - ) double vision, ( - ) watery eyes Ears, nose, mouth, throat, and face: ( - ) mucositis, ( - ) sore throat Respiratory: ( - ) cough, ( - ) dyspnea, ( - ) wheezes Cardiovascular: ( - ) palpitation,  ( - ) chest discomfort, ( - ) lower extremity swelling Gastrointestinal:  ( - ) nausea, ( - ) heartburn, ( - ) change in bowel habits Skin: ( - ) abnormal skin rashes Lymphatics: ( - ) new lymphadenopathy, ( - ) easy bruising Neurological: ( - ) numbness, ( - ) tingling, ( - ) new weaknesses Behavioral/Psych: ( - ) mood change, ( - ) new changes  All other systems were reviewed with the patient and are negative.  PHYSICAL EXAMINATION: ECOG PERFORMANCE STATUS: 0 - Asymptomatic  Vitals:   02/10/23 1352  BP: 133/82  Pulse: 76  Resp: 17  Temp: 97.6 F (36.4 C)  SpO2: 99%   Filed Weights   02/10/23 1352  Weight: 236 lb 1.6 oz (107.1 kg)    GENERAL: well appearing male in NAD  SKIN: skin color, texture, turgor are normal, no rashes or significant lesions EYES: conjunctiva are pink and non-injected, sclera clear LYMPH:  no palpable lymphadenopathy in the cervical or supraclavicular lymph nodes.  LUNGS: clear to auscultation and percussion with normal breathing effort HEART: regular rate & rhythm and no murmurs and no lower extremity edema. Musculoskeletal: no cyanosis of digits and no clubbing  PSYCH: alert & oriented x 3, fluent speech NEURO: no focal motor/sensory deficits  LABORATORY DATA:  I have reviewed the data as listed    Latest Ref Rng & Units 02/10/2023    1:28 PM 09/29/2022    2:57 PM 08/22/2022   12:10 PM  CBC  WBC 4.0 - 10.5 K/uL 8.6  9.6  8.4   Hemoglobin 13.0 - 17.0 g/dL 16.1  09.6  04.5   Hematocrit 39.0 - 52.0 % 39.1  40.9  41.3   Platelets 150 - 400 K/uL 157  175.0  166        Latest Ref Rng & Units 02/10/2023    1:28 PM 09/29/2022    2:57 PM 08/22/2022  12:10 PM  CMP  Glucose 70 - 99 mg/dL 433  85  295   BUN 8 - 23 mg/dL 26  25  20    Creatinine 0.61 - 1.24 mg/dL 1.88  4.16  6.06   Sodium 135 - 145 mmol/L 142  142  141   Potassium 3.5 - 5.1 mmol/L 4.9  4.9  5.2   Chloride 98 - 111 mmol/L 110  107  111   CO2 22 - 32 mmol/L 27  28  23    Calcium 8.9  - 10.3 mg/dL 9.6  9.4  9.5   Total Protein 6.5 - 8.1 g/dL 6.5  6.6    Total Bilirubin 0.3 - 1.2 mg/dL 0.9  1.1    Alkaline Phos 38 - 126 U/L 76  70    AST 15 - 41 U/L 15  15    ALT 0 - 44 U/L 11  11       PATHOLOGY:  Peripheral Blood Flow Cytometry - CHRONIC LYMPHOCYTIC LEUKEMIA, SEE COMMENT. Diagnosis Comment: There is a monoclonal B-cell population with expression of CD5 and CD23. Light chains are characteristically dim. These findings along with a monoclonal lymphocyte population of 7,500 meets criteria for chronic lymphocytic leukemia.  ASSESSMENT & PLAN Kevin Rogers is a 89 y.o. male who presents to the clinic for follow up for CLL.  #Chronic lymphocytic leukemia: --Previously under the care of Dr. Clelia Croft under active surveillance --Labs today reviewed and require no intervention. WBC 8.6, Hgb 12.6, Plt 157K, Creatinine stable at 1.62, LFTs normal, LDH normal. --No B symptoms identified.  --No indication to start treatment so recommend to continue on surveillance. --RTC in 1 year with labs.    No orders of the defined types were placed in this encounter.   All questions were answered. The patient knows to call the clinic with any problems, questions or concerns.  I have spent a total of 30 minutes minutes of face-to-face and non-face-to-face time, preparing to see the patient,  performing a medically appropriate examination, counseling and educating the patient,documenting clinical information in the electronic health record, independently interpreting results and communicating results to the patient, and care coordination.   Georga Kaufmann, PA-C Department of Hematology/Oncology [facility] Phone: [phone]  Patient was seen with Dr. Leonides Schanz.   Mathis Fare BD, Catovsky D, Caligaris-Cappio F, Dighiero G, Dhner H, P[place], [place], [place] E, Chiorazzi N, Stilgenbauer S, Rai KR, [place], Eichhorst B, O'Brien S, Robak T,  Seymour JF, Kipps TJ. iwCLL guidelines for diagnosis, indications for treatment, response assessment, and supportive management of CLL. Blood. 2018 Jun 21;131(25):2745-2760.  Active disease should be clearly documented to initiate therapy. At least 1 of the following criteria should be met.  1) Evidence of progressive marrow failure as manifested by the development of, or worsening of, anemia and/or thrombocytopenia. Cutoff levels of Hb <10 g/dL or platelet counts <355  109/L are generally regarded as indication for treatment. However, in some patients, platelet counts <100  109/L may remain stable over a long period; this situation does not automatically require therapeutic intervention. 2) Massive (ie, >=6 cm below the left costal margin) or progressive or symptomatic splenomegaly. 3) Massive nodes (ie, >=10 cm in longest diameter) or progressive or symptomatic lymphadenopathy. 4) Progressive lymphocytosis with an increase of >=50% over a 54-month period, or lymphocyte doubling time (LDT) <6 months. LDT can be obtained by linear regression extrapolation of absolute lymphocyte counts obtained at intervals of 2  weeks over an observation period of 2 to 3 months; patients with initial blood lymphocyte counts <30  109/L may require a longer observation period to determine the LDT. Factors contributing to lymphocytosis other than CLL (eg, infections, steroid administration) should be excluded. 5) Autoimmune complications including anemia or thrombocytopenia poorly responsive to corticosteroids. 6) Symptomatic or functional extranodal involvement (eg, skin, kidney, lung, spine). Disease-related symptoms as defined by any of the following: Unintentional weight loss >=10% within the previous 6 months. Significant fatigue (ie, ECOG performance scale 2 or worse; cannot work or unable to perform usual activities). Fevers >=100.83F or 38.0C for 2 or more weeks without evidence of infection. Night sweats for  >=1 month without evidence of infection.

## 2023-02-23 ENCOUNTER — Ambulatory Visit (INDEPENDENT_AMBULATORY_CARE_PROVIDER_SITE_OTHER): Payer: Medicare Other

## 2023-02-23 DIAGNOSIS — I442 Atrioventricular block, complete: Secondary | ICD-10-CM

## 2023-02-23 LAB — CUP PACEART REMOTE DEVICE CHECK
Battery Remaining Longevity: 137 mo
Battery Voltage: 3.09 V
Brady Statistic AP VP Percent: 51.27 %
Brady Statistic AP VS Percent: 0 %
Brady Statistic AS VP Percent: 48.71 %
Brady Statistic AS VS Percent: 0.01 %
Brady Statistic RA Percent Paced: 51.25 %
Brady Statistic RV Percent Paced: 99.98 %
Date Time Interrogation Session: 20241023191808
Implantable Lead Connection Status: 753985
Implantable Lead Connection Status: 753985
Implantable Lead Implant Date: 20240422
Implantable Lead Implant Date: 20240422
Implantable Lead Location: 753859
Implantable Lead Location: 753860
Implantable Lead Model: 3830
Implantable Lead Model: 5076
Implantable Pulse Generator Implant Date: 20240422
Lead Channel Impedance Value: 304 Ohm
Lead Channel Impedance Value: 304 Ohm
Lead Channel Impedance Value: 361 Ohm
Lead Channel Impedance Value: 437 Ohm
Lead Channel Pacing Threshold Amplitude: 0.375 V
Lead Channel Pacing Threshold Amplitude: 0.625 V
Lead Channel Pacing Threshold Pulse Width: 0.4 ms
Lead Channel Pacing Threshold Pulse Width: 0.4 ms
Lead Channel Sensing Intrinsic Amplitude: 1.625 mV
Lead Channel Sensing Intrinsic Amplitude: 1.625 mV
Lead Channel Sensing Intrinsic Amplitude: 5.5 mV
Lead Channel Sensing Intrinsic Amplitude: 5.5 mV
Lead Channel Setting Pacing Amplitude: 1.5 V
Lead Channel Setting Pacing Amplitude: 2 V
Lead Channel Setting Pacing Pulse Width: 0.4 ms
Lead Channel Setting Sensing Sensitivity: 1.2 mV
Zone Setting Status: 755011

## 2023-03-14 NOTE — Progress Notes (Signed)
Remote pacemaker transmission.   

## 2023-03-22 ENCOUNTER — Other Ambulatory Visit: Payer: Self-pay | Admitting: Family Medicine

## 2023-04-09 ENCOUNTER — Other Ambulatory Visit: Payer: Self-pay | Admitting: Family Medicine

## 2023-04-21 DIAGNOSIS — M961 Postlaminectomy syndrome, not elsewhere classified: Secondary | ICD-10-CM | POA: Diagnosis not present

## 2023-04-21 DIAGNOSIS — M7918 Myalgia, other site: Secondary | ICD-10-CM | POA: Diagnosis not present

## 2023-05-25 ENCOUNTER — Ambulatory Visit (INDEPENDENT_AMBULATORY_CARE_PROVIDER_SITE_OTHER): Payer: Medicare Other

## 2023-05-25 DIAGNOSIS — I442 Atrioventricular block, complete: Secondary | ICD-10-CM | POA: Diagnosis not present

## 2023-05-25 LAB — CUP PACEART REMOTE DEVICE CHECK
Battery Remaining Longevity: 134 mo
Battery Voltage: 3.05 V
Brady Statistic AP VP Percent: 52.19 %
Brady Statistic AP VS Percent: 0 %
Brady Statistic AS VP Percent: 47.8 %
Brady Statistic AS VS Percent: 0.01 %
Brady Statistic RA Percent Paced: 52.16 %
Brady Statistic RV Percent Paced: 99.98 %
Date Time Interrogation Session: 20250122204732
Implantable Lead Connection Status: 753985
Implantable Lead Connection Status: 753985
Implantable Lead Implant Date: 20240422
Implantable Lead Implant Date: 20240422
Implantable Lead Location: 753859
Implantable Lead Location: 753860
Implantable Lead Model: 3830
Implantable Lead Model: 5076
Implantable Pulse Generator Implant Date: 20240422
Lead Channel Impedance Value: 285 Ohm
Lead Channel Impedance Value: 304 Ohm
Lead Channel Impedance Value: 361 Ohm
Lead Channel Impedance Value: 437 Ohm
Lead Channel Pacing Threshold Amplitude: 0.375 V
Lead Channel Pacing Threshold Amplitude: 0.75 V
Lead Channel Pacing Threshold Pulse Width: 0.4 ms
Lead Channel Pacing Threshold Pulse Width: 0.4 ms
Lead Channel Sensing Intrinsic Amplitude: 2.375 mV
Lead Channel Sensing Intrinsic Amplitude: 2.375 mV
Lead Channel Sensing Intrinsic Amplitude: 8.75 mV
Lead Channel Sensing Intrinsic Amplitude: 8.75 mV
Lead Channel Setting Pacing Amplitude: 1.5 V
Lead Channel Setting Pacing Amplitude: 2 V
Lead Channel Setting Pacing Pulse Width: 0.4 ms
Lead Channel Setting Sensing Sensitivity: 1.2 mV
Zone Setting Status: 755011

## 2023-05-26 ENCOUNTER — Encounter: Payer: Self-pay | Admitting: Internal Medicine

## 2023-06-14 ENCOUNTER — Other Ambulatory Visit: Payer: Self-pay | Admitting: Family Medicine

## 2023-06-23 DIAGNOSIS — H2513 Age-related nuclear cataract, bilateral: Secondary | ICD-10-CM | POA: Diagnosis not present

## 2023-06-23 DIAGNOSIS — H401132 Primary open-angle glaucoma, bilateral, moderate stage: Secondary | ICD-10-CM | POA: Diagnosis not present

## 2023-07-04 ENCOUNTER — Other Ambulatory Visit: Payer: Self-pay | Admitting: Family Medicine

## 2023-07-05 NOTE — Progress Notes (Signed)
 Remote pacemaker transmission.

## 2023-07-26 DIAGNOSIS — M961 Postlaminectomy syndrome, not elsewhere classified: Secondary | ICD-10-CM | POA: Diagnosis not present

## 2023-07-26 DIAGNOSIS — M7918 Myalgia, other site: Secondary | ICD-10-CM | POA: Diagnosis not present

## 2023-08-11 ENCOUNTER — Encounter: Payer: Medicare Other | Admitting: Family Medicine

## 2023-08-20 ENCOUNTER — Other Ambulatory Visit: Payer: Self-pay | Admitting: Family Medicine

## 2023-08-24 ENCOUNTER — Other Ambulatory Visit: Payer: Self-pay | Admitting: Family Medicine

## 2023-08-24 ENCOUNTER — Ambulatory Visit (INDEPENDENT_AMBULATORY_CARE_PROVIDER_SITE_OTHER): Payer: Medicare Other

## 2023-08-24 DIAGNOSIS — I442 Atrioventricular block, complete: Secondary | ICD-10-CM | POA: Diagnosis not present

## 2023-08-24 DIAGNOSIS — E1122 Type 2 diabetes mellitus with diabetic chronic kidney disease: Secondary | ICD-10-CM

## 2023-08-24 LAB — CUP PACEART REMOTE DEVICE CHECK
Battery Remaining Longevity: 130 mo
Battery Voltage: 3.03 V
Brady Statistic AP VP Percent: 55.15 %
Brady Statistic AP VS Percent: 0.01 %
Brady Statistic AS VP Percent: 44.82 %
Brady Statistic AS VS Percent: 0.02 %
Brady Statistic RA Percent Paced: 55.13 %
Brady Statistic RV Percent Paced: 99.97 %
Date Time Interrogation Session: 20250423221400
Implantable Lead Connection Status: 753985
Implantable Lead Connection Status: 753985
Implantable Lead Implant Date: 20240422
Implantable Lead Implant Date: 20240422
Implantable Lead Location: 753859
Implantable Lead Location: 753860
Implantable Lead Model: 3830
Implantable Lead Model: 5076
Implantable Pulse Generator Implant Date: 20240422
Lead Channel Impedance Value: 285 Ohm
Lead Channel Impedance Value: 285 Ohm
Lead Channel Impedance Value: 342 Ohm
Lead Channel Impedance Value: 418 Ohm
Lead Channel Pacing Threshold Amplitude: 0.375 V
Lead Channel Pacing Threshold Amplitude: 0.625 V
Lead Channel Pacing Threshold Pulse Width: 0.4 ms
Lead Channel Pacing Threshold Pulse Width: 0.4 ms
Lead Channel Sensing Intrinsic Amplitude: 3.25 mV
Lead Channel Sensing Intrinsic Amplitude: 3.25 mV
Lead Channel Sensing Intrinsic Amplitude: 8.875 mV
Lead Channel Sensing Intrinsic Amplitude: 8.875 mV
Lead Channel Setting Pacing Amplitude: 1.5 V
Lead Channel Setting Pacing Amplitude: 2 V
Lead Channel Setting Pacing Pulse Width: 0.4 ms
Lead Channel Setting Sensing Sensitivity: 1.2 mV
Zone Setting Status: 755011

## 2023-08-29 ENCOUNTER — Encounter: Payer: Self-pay | Admitting: Internal Medicine

## 2023-09-18 ENCOUNTER — Ambulatory Visit (INDEPENDENT_AMBULATORY_CARE_PROVIDER_SITE_OTHER): Admitting: Family Medicine

## 2023-09-18 ENCOUNTER — Encounter: Payer: Self-pay | Admitting: Family Medicine

## 2023-09-18 VITALS — BP 132/84 | HR 95 | Temp 97.5°F | Ht 72.0 in | Wt 231.0 lb

## 2023-09-18 DIAGNOSIS — C9110 Chronic lymphocytic leukemia of B-cell type not having achieved remission: Secondary | ICD-10-CM

## 2023-09-18 DIAGNOSIS — Z95811 Presence of heart assist device: Secondary | ICD-10-CM

## 2023-09-18 DIAGNOSIS — E1122 Type 2 diabetes mellitus with diabetic chronic kidney disease: Secondary | ICD-10-CM

## 2023-09-18 DIAGNOSIS — E538 Deficiency of other specified B group vitamins: Secondary | ICD-10-CM | POA: Diagnosis not present

## 2023-09-18 DIAGNOSIS — I442 Atrioventricular block, complete: Secondary | ICD-10-CM

## 2023-09-18 DIAGNOSIS — Z0000 Encounter for general adult medical examination without abnormal findings: Secondary | ICD-10-CM

## 2023-09-18 DIAGNOSIS — N183 Chronic kidney disease, stage 3 unspecified: Secondary | ICD-10-CM

## 2023-09-18 LAB — MICROALBUMIN / CREATININE URINE RATIO
Creatinine,U: 71.2 mg/dL
Microalb Creat Ratio: UNDETERMINED mg/g (ref 0.0–30.0)
Microalb, Ur: <0.7 mg/dL

## 2023-09-18 LAB — COMPREHENSIVE METABOLIC PANEL WITH GFR
ALT: 12 U/L (ref 0–53)
AST: 16 U/L (ref 0–37)
Albumin: 3.8 g/dL (ref 3.5–5.2)
Alkaline Phosphatase: 67 U/L (ref 39–117)
BUN: 20 mg/dL (ref 6–23)
CO2: 28 meq/L (ref 19–32)
Calcium: 9.3 mg/dL (ref 8.4–10.5)
Chloride: 109 meq/L (ref 96–112)
Creatinine, Ser: 1.29 mg/dL (ref 0.40–1.50)
GFR: 49.14 mL/min — ABNORMAL LOW
Glucose, Bld: 93 mg/dL (ref 70–99)
Potassium: 4.8 meq/L (ref 3.5–5.1)
Sodium: 142 meq/L (ref 135–145)
Total Bilirubin: 1 mg/dL (ref 0.2–1.2)
Total Protein: 6.4 g/dL (ref 6.0–8.3)

## 2023-09-18 LAB — LIPID PANEL
Cholesterol: 158 mg/dL (ref 0–200)
HDL: 50.9 mg/dL
LDL Cholesterol: 93 mg/dL (ref 0–99)
NonHDL: 107.06
Total CHOL/HDL Ratio: 3
Triglycerides: 70 mg/dL (ref 0.0–149.0)
VLDL: 14 mg/dL (ref 0.0–40.0)

## 2023-09-18 LAB — CBC WITH DIFFERENTIAL/PLATELET
Basophils Absolute: 0 K/uL (ref 0.0–0.1)
Basophils Relative: 0.2 % (ref 0.0–3.0)
Eosinophils Absolute: 0.1 K/uL (ref 0.0–0.7)
Eosinophils Relative: 1.5 % (ref 0.0–5.0)
HCT: 37.6 % — ABNORMAL LOW (ref 39.0–52.0)
Hemoglobin: 12.2 g/dL — ABNORMAL LOW (ref 13.0–17.0)
Lymphocytes Relative: 45.3 % (ref 12.0–46.0)
Lymphs Abs: 3.8 K/uL (ref 0.7–4.0)
MCHC: 32.6 g/dL (ref 30.0–36.0)
MCV: 89.4 fl (ref 78.0–100.0)
Monocytes Absolute: 0.3 K/uL (ref 0.1–1.0)
Monocytes Relative: 3.8 % (ref 3.0–12.0)
Neutro Abs: 4.1 K/uL (ref 1.4–7.7)
Neutrophils Relative %: 49.2 % (ref 43.0–77.0)
Platelets: 184 K/uL (ref 150.0–400.0)
RBC: 4.2 Mil/uL — ABNORMAL LOW (ref 4.22–5.81)
RDW: 13.2 % (ref 11.5–15.5)
WBC: 8.3 K/uL (ref 4.0–10.5)

## 2023-09-18 LAB — HEMOGLOBIN A1C: Hgb A1c MFr Bld: 6.8 % — ABNORMAL HIGH (ref 4.6–6.5)

## 2023-09-18 MED ORDER — LOSARTAN POTASSIUM 50 MG PO TABS: 50.0000 mg | ORAL_TABLET | Freq: Every day | ORAL | 3 refills | Status: DC

## 2023-09-18 NOTE — Patient Instructions (Addendum)
 Please stop by lab before you go If you have mychart- we will send your results within 3 business days of us  receiving them.  If you do not have mychart- we will call you about results within 5 business days of us  receiving them.  *please also note that you will see labs on mychart as soon as they post. I will later go in and write notes on them- will say "notes from Dr. Arlene Ben"   Get going again on chair exercises!   Recommended follow up: Return in about 6 months (around 03/20/2024) for followup or sooner if needed.Schedule b4 you leave.

## 2023-09-18 NOTE — Progress Notes (Signed)
 Phone: [phone]   Subjective:  Patient presents today for their annual physical. Chief complaint-noted.   See problem oriented charting- ROS- full  review of systems was completed and negative  Per full ROS sheet completed by patient except for topics noted under acute/chronic concerns  The following were reviewed and entered/updated in epic: Past Medical History:  Diagnosis Date   Acute bronchitis 09/12/2007   Qualifier: Diagnosis of  By: Larrie Po MD, Wilmon Hashimoto    Diabetes mellitus type 2 with complications (HCC) 1997   Glaucoma    left eye   Gout    Hyperlipidemia    Hypertension    Leukemia, chronic (HCC) 2001   does not take treatment just has blood levels checked once a year   Patient Active Problem List   Diagnosis Date Noted   Complete heart block (HCC) 08/22/2022    Priority: High   Chronic lymphocytic leukemia (HCC) 03/08/2007    Priority: High   Diabetes mellitus type II, controlled (HCC) 03/08/2007    Priority: High   History of osteomyelitis of lumbar vertebrae     Priority: Medium    Hyperlipidemia     Priority: Medium    CKD (chronic kidney disease), stage III (HCC) 01/13/2015    Priority: Medium    Lumbar spinal stenosis 06/26/2014    Priority: Medium    BPH (benign prostatic hyperplasia) 06/24/2014    Priority: Medium    Gout 03/08/2007    Priority: Medium    Essential hypertension 03/08/2007    Priority: Medium    Enteritis due to Clostridium difficile     Priority: Low   Discitis of lumbar region     Priority: Low   B12 deficiency 12/12/2008    Priority: Low   Diabetic polyneuropathy (HCC) 12/12/2008    Priority: Low   ERECTILE DYSFUNCTION 03/18/2008    Priority: Low   Glaucoma 03/08/2007    Priority: Low   Osteoarthritis 03/08/2007    Priority: Low   Past Surgical History:  Procedure Laterality Date   ANTERIOR LAT LUMBAR FUSION Left 01/03/2013   Procedure: ANTERIOR LATERAL LUMBAR FUSION lumbar three-four;  Surgeon: Augustine Blocker, MD;   Location: [facility];  Service: Neurosurgery;  Laterality: Left;   BACK SURGERY     lumbar fusion - 2016, feb   COLONOSCOPY W/ POLYPECTOMY     DENTAL SURGERY     implanted teeth x 2   HERNIA REPAIR  1989   LUMBAR LAMINECTOMY  1997   LUMBAR PERCUTANEOUS PEDICLE SCREW 1 LEVEL  01/03/2013   Procedure: LUMBAR PERCUTANEOUS PEDICLE SCREWS LUMBAR THREE-FOUR;  Surgeon: Augustine Blocker, MD;  Location: [facility];  Service: Neurosurgery;;   LUMBAR WOUND DEBRIDEMENT N/A 07/23/2014   Procedure: LUMBAR WOUND DEBRIDEMENT;  Surgeon: Tivis Forster, MD;  Location: [facility];  Service: Neurosurgery;  Laterality: N/A;   PACEMAKER IMPLANT N/A 08/22/2022   Procedure: PACEMAKER IMPLANT;  Surgeon: Tammie Fall, MD;  Location: [facility];  Service: Cardiovascular;  Laterality: N/A;   TONSILLECTOMY      Family History  Problem Relation Age of Onset   Heart disease Mother        12, second hand   Heart disease Father        42, former smoker    Medications- reviewed and updated Current Outpatient Medications  Medication Sig Dispense Refill   acetaminophen  (TYLENOL ) 325 MG tablet Take 1-2 tablets (325-650 mg total) by mouth every 4 (four) hours as needed for  mild pain.     atorvastatin  (LIPITOR) 10 MG tablet TAKE 1 TABLET BY MOUTH WEEKLY 15 tablet 2   BD PEN NEEDLE NANO 2ND GEN 32G X 4 MM MISC USE AS DIRECTED 100 each 1   finasteride  (PROSCAR ) 5 MG tablet TAKE 1 TABLET BY MOUTH EVERY DAY 90 tablet 3   glucose blood (ONETOUCH VERIO) test strip USE TO TEST BLOOD SUGARS DAILY 100 strip 12   HYDROcodone -acetaminophen  (NORCO) 7.5-325 MG tablet Take 1 tablet by mouth 2 (two) times daily.     insulin  degludec (TRESIBA  FLEXTOUCH) 100 UNIT/ML FlexTouch Pen Inject 14 Units into the skin daily. 15 mL 3   losartan  (COZAAR ) 50 MG tablet Take 1 tablet (50 mg total) by mouth daily. 90 tablet 3   Misc Natural Products (GLUCOSAMINE CHOND CMP ADVANCED PO) Take by mouth in the morning and at bedtime.     ONE  TOUCH LANCETS MISC Use to test blood sugars. Dx:E11.9 100 each 11   tamsulosin  (FLOMAX ) 0.4 MG CAPS capsule TAKE 1 CAPSULE BY MOUTH DAILY 100 capsule 2   timolol (TIMOPTIC) 0.5 % ophthalmic solution 1 drop 2 (two) times daily.     TRAVATAN  Z 0.004 % SOLN ophthalmic solution Place 1 drop into the left eye at bedtime.      vitamin B-12 (CYANOCOBALAMIN ) 1000 MCG tablet Take 1,000 mcg by mouth daily.     fluticasone  (FLONASE ) 50 MCG/ACT nasal spray SPRAY 2 SPRAYS INTO EACH NOSTRIL EVERY DAY (Patient not taking: Reported on 09/18/2023) 16 mL 1   No current facility-administered medications for this visit.    Allergies-reviewed and updated No Known Allergies  Social History   Social History Narrative   Remarried. 2017. Widowed 2009. 2 sons. 8 grandkids. 3 greatgrands      Retired from [employer]       Hobbies: golf stopped(hard with back), watch tv (pain with moving around)   Objective  Objective:  BP 132/84   Pulse 95   Temp (!) 97.5 F (36.4 C)   Ht 6' (1.829 m)   Wt 231 lb (104.8 kg)   SpO2 99%   BMI 31.33 kg/m  Gen: NAD, resting comfortably HEENT: Mucous membranes are moist. Oropharynx normal Neck: no thyromegaly CV: RRR no murmurs rubs or gallops Lungs: CTAB no crackles, wheeze, rhonchi Abdomen: soft/nontender/nondistended/normal bowel sounds. No rebound or guarding.  Ext: 1+ right greater than left edema Skin: warm, dry Neuro: grossly normal, moves all extremities, PERRLA    Assessment and Plan  89 y.o. male presenting for annual physical.  Health Maintenance counseling: 1. Anticipatory guidance: Patient counseled regarding regular dental exams -q6 months, eye exams -due to glaucoma twice yearly,  avoiding smoking and second hand smoke , limiting alcohol to 2 beverages per day - doesn't drink, no illicit drugs .   2. Risk factor reduction:  Advised patient of need for regular exercise and diet rich and fruits and vegetables to reduce risk of heart attack and stroke.   Exercise- limited by his back- tries chair exercises in past- encouraged to restart  Diet/weight management-down another 10 pounds from last physical- was down 10 lbs at that time as well - appetite lower.  Wt Readings from Last 3 Encounters:  09/18/23 231 lb (104.8 kg)  02/10/23 236 lb 1.6 oz (107.1 kg)  12/06/22 235 lb (106.6 kg)  3. Immunizations/screenings/ancillary studies- COVID getting regularly including recently about a month ago Immunization History  Administered Date(s) Administered   Fluad Quad(high Dose 65+) 01/02/2019, 02/27/2020, 02/08/2021,  02/09/2022   Influenza Split 02/10/2011, 01/11/2012   Influenza Whole 03/15/2007, 01/31/2008, 02/04/2009, 02/01/2010   Influenza, High Dose Seasonal PF 01/18/2016, 01/24/2017, 01/29/2018   Influenza,inj,Quad PF,6+ Mos 02/12/2014, 01/13/2015   Moderna Covid-19 Vaccine Bivalent Booster 13yrs & up 02/09/2022   Moderna Sars-Covid-2 Vaccination 05/24/2019, 06/10/2019, 02/29/2020, 08/06/2020   Pneumococcal Conjugate-13 03/23/2015   Pneumococcal Polysaccharide-23 03/24/2009, 02/12/2014   Td 03/09/2007   Tdap 09/19/2017   Zoster Recombinant(Shingrix) 03/20/2023  4. Prostate cancer screening-  overall low risk prior trend- past age based screening recommendations . Holding off on PSA Lab Results  Component Value Date   PSA 0.51 07/08/2015   PSA 0.66 08/05/2013   PSA 0.80 07/23/2012   5. Colon cancer screening -  past age based screening recommendations - no blood in stool or melena  6. Skin cancer screening- lower risk due to melanin content . advised regular sunscreen use. Denies worrisome, changing, or new skin lesions.  7. Smoking associated screening (lung cancer screening, AAA screen 65-75, UA)- former smoker- quit many years ago- no regular screening 8. STD screening - only active with wife  Status of chronic or acute concerns    #Mild loss of train of thought- wife still feels he is sharp. Some hearing loss- [facility]- requesting hearing aids.   # Complete heart block-hospitalization August 22, 2022 for pacemaker implantation- has received good reports   #hypertension/CKD stage III S: medication: Losartan  100 mg daily (half tablet due to potassium) Home readings #s: does not check at home  GFR typically in the 50s-previously on meloxicam  but stopped in 2017 A/P: Hypertension-well controlled continue current medications   CKD stage III-reasonably stable in 40's for gfr- updat today    # Diabetes S: Medication: tresiba  20 units.   -prior Glipizide  5 mg extended release Release CBGs- 90 day average 116 . Lowest of 75 Exercise and diet- No exercise due to immobility Lab Results  Component Value Date   HGBA1C 6.2 09/29/2022   HGBA1C 7.0 (H) 03/29/2022   HGBA1C 6.3 09/16/2021  A/P: hopefully stable- update a1c today. Continue current meds for now - glad he stopped the glipizide - I was worried about hypoglycemia in long run with that anyway  #hyperlipidemia S: Medication:Atorvastatin  10 mg once a week.   Lab Results  Component Value Date   CHOL 161 03/29/2022   HDL 55.20 03/29/2022   LDLCALC 95 03/29/2022   LDLDIRECT 76.0 11/23/2017   TRIG 56.0 03/29/2022   CHOLHDL 3 03/29/2022   A/P: likely not to change this even if mildly more elevated but update today regardless   #CLL-continues yearly visits with oncology. Diagnosed in 2000. We will check CBC with differential today to make sure stable- check today  #Lumbar spinal stenosis S: Limits mobility-chronic back pain. Currently taking hydrocodone  through [facility]- prior Dr. Eben Golds A/P: ongoing pain but doing the best he can   # B12 deficiency S: Current treatment/medication (oral vs. IM): Oral 1000 mcg Lab Results  Component Value Date   VITAMINB12 812 09/29/2022  A/P:  hopefully stable- update b12 today. Continue current meds for now   # BPH S:medication: finasteride  5 mg, tamsulosin  0.4 mg - if  misses dose of tamsulosin  he definitely notices it! But does reasonably well on it  A/P: continue current medications    #trigger thumb-- once the other week without recurrent issues- may trial Voltaren gel but if still bothering him I can refer to sports medicine   Recommended follow  up: Return in about 6 months (around 03/20/2024) for followup or sooner if needed.Schedule b4 you leave. Future Appointments  Date Time Provider Department Center  11/23/2023  7:05 AM [facility] DEVICE REMOTES [facility]  12/11/2023  4:00 PM [facility] ANNUAL WELLNESS VISIT 1 [facility] PEC  02/16/2024  1:00 PM [facility] LAB [facility] None  02/16/2024  1:40 PM Darilyn Edin, PA-C [facility] None  02/22/2024  7:05 AM [facility] DEVICE REMOTES [facility]  05/23/2024  7:05 AM [facility] DEVICE REMOTES [facility]  08/22/2024  7:05 AM [facility] DEVICE REMOTES [facility]    Lab/Order associations: fasting   ICD-10-CM   1. Preventative health care  Z00.00     2. Presence of heart assist device [facility]) Chronic Z95.811     3. Chronic lymphocytic leukemia (HCC) Chronic C91.10     4. Heart block AV complete (HCC) Chronic I44.2     5. Controlled type 2 diabetes mellitus with stage 2 chronic kidney disease, without long-term current use of insulin  [facility]) Chronic E11.22 Comprehensive metabolic panel with GFR   N18.2 CBC with Differential/Platelet    Lipid panel    Hemoglobin A1c    Microalbumin / creatinine urine ratio    6. Stage 3 chronic kidney disease, unspecified whether stage 3a or 3b CKD (HCC) Chronic N18.30 Comprehensive metabolic panel with GFR    Microalbumin / creatinine urine ratio    7. B12 deficiency  E53.8 Vitamin B12      Meds ordered this encounter  Medications   losartan  (COZAAR ) 50 MG tablet    Sig: Take 1 tablet (50 mg total) by mouth daily.    Dispense:  90 tablet    Refill:  3    Return precautions advised.  Clarisa Crooked, MD

## 2023-09-19 ENCOUNTER — Ambulatory Visit: Payer: Self-pay | Admitting: Family Medicine

## 2023-09-19 LAB — VITAMIN B12: Vitamin B-12: 716 pg/mL (ref 211–911)

## 2023-10-03 NOTE — Progress Notes (Signed)
 Remote pacemaker transmission.

## 2023-10-17 DIAGNOSIS — M7918 Myalgia, other site: Secondary | ICD-10-CM | POA: Diagnosis not present

## 2023-10-17 DIAGNOSIS — M961 Postlaminectomy syndrome, not elsewhere classified: Secondary | ICD-10-CM | POA: Diagnosis not present

## 2023-11-02 ENCOUNTER — Other Ambulatory Visit: Payer: Self-pay | Admitting: Family Medicine

## 2023-11-23 ENCOUNTER — Ambulatory Visit: Payer: Medicare Other

## 2023-11-23 DIAGNOSIS — I442 Atrioventricular block, complete: Secondary | ICD-10-CM

## 2023-11-24 LAB — CUP PACEART REMOTE DEVICE CHECK
Battery Remaining Longevity: 118 mo
Battery Voltage: 3.01 V
Brady Statistic AP VP Percent: 62.58 %
Brady Statistic AP VS Percent: 0 %
Brady Statistic AS VP Percent: 37.39 %
Brady Statistic AS VS Percent: 0.02 %
Brady Statistic RA Percent Paced: 62.55 %
Brady Statistic RV Percent Paced: 99.97 %
Date Time Interrogation Session: 20250724071701
Implantable Lead Connection Status: 753985
Implantable Lead Connection Status: 753985
Implantable Lead Implant Date: 20240422
Implantable Lead Implant Date: 20240422
Implantable Lead Location: 753859
Implantable Lead Location: 753860
Implantable Lead Model: 3830
Implantable Lead Model: 5076
Implantable Pulse Generator Implant Date: 20240422
Lead Channel Impedance Value: 285 Ohm
Lead Channel Impedance Value: 304 Ohm
Lead Channel Impedance Value: 342 Ohm
Lead Channel Impedance Value: 418 Ohm
Lead Channel Pacing Threshold Amplitude: 0.375 V
Lead Channel Pacing Threshold Amplitude: 0.875 V
Lead Channel Pacing Threshold Pulse Width: 0.4 ms
Lead Channel Pacing Threshold Pulse Width: 0.4 ms
Lead Channel Sensing Intrinsic Amplitude: 2.25 mV
Lead Channel Sensing Intrinsic Amplitude: 2.25 mV
Lead Channel Sensing Intrinsic Amplitude: 6.125 mV
Lead Channel Sensing Intrinsic Amplitude: 6.125 mV
Lead Channel Setting Pacing Amplitude: 1.5 V
Lead Channel Setting Pacing Amplitude: 2.25 V
Lead Channel Setting Pacing Pulse Width: 0.4 ms
Lead Channel Setting Sensing Sensitivity: 1.2 mV
Zone Setting Status: 755011

## 2023-11-27 ENCOUNTER — Ambulatory Visit: Payer: Self-pay | Admitting: Internal Medicine

## 2023-12-11 ENCOUNTER — Ambulatory Visit (INDEPENDENT_AMBULATORY_CARE_PROVIDER_SITE_OTHER): Payer: Medicare Other

## 2023-12-11 VITALS — Ht 72.0 in | Wt 231.0 lb

## 2023-12-11 DIAGNOSIS — Z0000 Encounter for general adult medical examination without abnormal findings: Secondary | ICD-10-CM

## 2023-12-11 NOTE — Patient Instructions (Signed)
 Mr. Kevin Rogers , Thank you for taking time out of your busy schedule to complete your Annual Wellness Visit with me. I enjoyed our conversation and look forward to speaking with you again next year. I, as well as your care team,  appreciate your ongoing commitment to your health goals. Please review the following plan we discussed and let me know if I can assist you in the future. Your Game plan/ To Do List    Referrals: If you haven't heard from the office you've been referred to, please reach out to them at the phone provided.   Follow up Visits: We will see or speak with you next year for your Next Medicare AWV with our clinical staff Have you seen your provider in the last 6 months (3 months if uncontrolled diabetes)? Yes  Clinician Recommendations: Each day, aim for 6 glasses of water , plenty of protein in your diet and try to get up and walk/ stretch every hour for 5-10 minutes at a time.        This is a list of the screenings recommended for you:  Health Maintenance  Topic Date Due   Complete foot exam   09/29/2023   Eye exam for diabetics  11/08/2023   Medicare Annual Wellness Visit  12/06/2023   Flu Shot  12/01/2023   COVID-19 Vaccine (8 - Moderna risk 2024-25 season) 02/07/2024   Hemoglobin A1C  03/20/2024   DTaP/Tdap/Td vaccine (3 - Td or Tdap) 09/20/2027   Pneumococcal Vaccine for age over 71  Completed   Hepatitis B Vaccine  Aged Out   HPV Vaccine  Aged Out   Meningitis B Vaccine  Aged Out   Zoster (Shingles) Vaccine  Discontinued    Advanced directives: (Copy Requested) Please bring a copy of your health care power of attorney and living will to the office to be added to your chart at your convenience. You can mail to [facility] [address] [place], [place] [address] or email to [email or web address] Advance Care Planning is important because it:  [x]  Makes sure you receive the medical care that is consistent with your values, goals, and  preferences  [x]  It provides guidance to your family and loved ones and reduces their decisional burden about whether or not they are making the right decisions based on your wishes.  Follow the link provided in your after visit summary or read over the paperwork we have mailed to you to help you started getting your Advance Directives in place. If you need assistance in completing these, please reach out to us  so that we can help you!  See attachments for Preventive Care and Fall Prevention Tips.

## 2023-12-11 NOTE — Progress Notes (Signed)
 Subjective:   Kevin Rogers is a 89 y.o. who presents for a Medicare Wellness preventive visit.  As a reminder, Annual Wellness Visits don't include a physical exam, and some assessments may be limited, especially if this visit is performed virtually. We may recommend an in-person follow-up visit with your provider if needed.  Visit Complete: Virtual I connected with  Kevin Rogers on 12/11/23 by a audio enabled telemedicine application and verified that I am speaking with the correct person using two identifiers.  Patient Location: Home  Provider Location: Office/Clinic  I discussed the limitations of evaluation and management by telemedicine. The patient expressed understanding and agreed to proceed.  Vital Signs: Because this visit was a virtual/telehealth visit, some criteria may be missing or patient reported. Any vitals not documented were not able to be obtained and vitals that have been documented are patient reported.  VideoDeclined- This patient declined Librarian, academic. Therefore the visit was completed with audio only.  Persons Participating in Visit: Patient.  AWV Questionnaire: No: Patient Medicare AWV questionnaire was not completed prior to this visit.  Cardiac Risk Factors include: advanced age (>30men, >37 women);diabetes mellitus;dyslipidemia;obesity (BMI >30kg/m2);male gender;hypertension     Objective:    Today's Vitals   12/11/23 1046  Weight: 231 lb (104.8 kg)  Height: 6' (1.829 m)   Body mass index is 31.33 kg/m.     12/11/2023   10:54 AM 12/06/2022    3:38 PM 08/22/2022   12:28 PM 11/29/2021    2:58 PM 11/13/2020    2:34 PM 02/07/2020    1:08 PM 09/12/2019    8:37 AM  Advanced Directives  Does Patient Have a Medical Advance Directive? Yes Yes No Yes Yes Yes Yes  Type of Estate agent of [place];Living will Healthcare Power of [place];Living will  Healthcare Power of Attorney Living will Living will  Living will;Healthcare Power of Attorney  Does patient want to make changes to medical advance directive?      No - Patient declined No - Patient declined  Copy of Healthcare Power of Attorney in Chart? No - copy requested No - copy requested  No - copy requested   No - copy requested  Would patient like information on creating a medical advance directive?      No - Patient declined     Current Medications (verified) Outpatient Encounter Medications as of 12/11/2023  Medication Sig   atorvastatin  (LIPITOR) 10 MG tablet TAKE 1 TABLET BY MOUTH WEEKLY   BD PEN NEEDLE NANO 2ND GEN 32G X 4 MM MISC USE AS DIRECTED   COMIRNATY syringe    finasteride  (PROSCAR ) 5 MG tablet TAKE 1 TABLET BY MOUTH EVERY DAY   fluticasone  (FLONASE ) 50 MCG/ACT nasal spray SPRAY 2 SPRAYS INTO EACH NOSTRIL EVERY DAY   glucose blood (ONETOUCH VERIO) test strip USE TO TEST BLOOD SUGARS DAILY   HYDROcodone -acetaminophen  (NORCO) 7.5-325 MG tablet Take 1 tablet by mouth 2 (two) times daily.   insulin  degludec (TRESIBA  FLEXTOUCH) 100 UNIT/ML FlexTouch Pen Inject 14 Units into the skin daily.   losartan  (COZAAR ) 50 MG tablet Take 1 tablet (50 mg total) by mouth daily.   Misc Natural Products (GLUCOSAMINE CHOND CMP ADVANCED PO) Take by mouth in the morning and at bedtime.   ONE TOUCH LANCETS MISC Use to test blood sugars. Dx:E11.9   tamsulosin  (FLOMAX ) 0.4 MG CAPS capsule TAKE 1 CAPSULE BY MOUTH DAILY   timolol (TIMOPTIC) 0.5 % ophthalmic solution 1 drop 2 (two)  times daily.   TRAVATAN  Z 0.004 % SOLN ophthalmic solution Place 1 drop into the left eye at bedtime.    vitamin B-12 (CYANOCOBALAMIN ) 1000 MCG tablet Take 1,000 mcg by mouth daily.   acetaminophen  (TYLENOL ) 325 MG tablet Take 1-2 tablets (325-650 mg total) by mouth every 4 (four) hours as needed for mild pain. (Patient not taking: Reported on 12/11/2023)   No facility-administered encounter medications on file as of 12/11/2023.    Allergies (verified) Patient has no  known allergies.   History: Past Medical History:  Diagnosis Date   Acute bronchitis 09/12/2007   Qualifier: Diagnosis of  By: Mavis MD, Norleen BRAVO    Diabetes mellitus type 2 with complications (HCC) 1997   Glaucoma    left eye   Gout    Hyperlipidemia    Hypertension    Leukemia, chronic (HCC) 2001   does not take treatment just has blood levels checked once a year   Past Surgical History:  Procedure Laterality Date   ANTERIOR LAT LUMBAR FUSION Left 01/03/2013   Procedure: ANTERIOR LATERAL LUMBAR FUSION lumbar three-four;  Surgeon: Darina MALVA Boehringer, MD;  Location: [facility];  Service: Neurosurgery;  Laterality: Left;   BACK SURGERY     lumbar fusion - 2016, feb   COLONOSCOPY W/ POLYPECTOMY     DENTAL SURGERY     implanted teeth x 2   HERNIA REPAIR  1989   LUMBAR LAMINECTOMY  1997   LUMBAR PERCUTANEOUS PEDICLE SCREW 1 LEVEL  01/03/2013   Procedure: LUMBAR PERCUTANEOUS PEDICLE SCREWS LUMBAR THREE-FOUR;  Surgeon: Darina MALVA Boehringer, MD;  Location: [facility];  Service: Neurosurgery;;   LUMBAR WOUND DEBRIDEMENT N/A 07/23/2014   Procedure: LUMBAR WOUND DEBRIDEMENT;  Surgeon: Darina Boehringer, MD;  Location: [facility];  Service: Neurosurgery;  Laterality: N/A;   PACEMAKER IMPLANT N/A 08/22/2022   Procedure: PACEMAKER IMPLANT;  Surgeon: Waddell Danelle ORN, MD;  Location: [facility]IVE CV [facility];  Service: Cardiovascular;  Laterality: N/A;   TONSILLECTOMY     Family History  Problem Relation Age of Onset   Heart disease Mother        56, second hand   Heart disease Father        69, former smoker   Social History   Socioeconomic History   Marital status: Married    Spouse name: Not on file   Number of children: Not on file   Years of education: Not on file   Highest education level: Associate degree: occupational, Scientist, product/process development, or vocational program  Occupational History   Occupation: Retired     Comment: -Comptroller   Tobacco Use   Smoking status: Former    Current packs/day:  0.00    Average packs/day: 0.3 packs/day for 20.0 years (5.0 ttl pk-yrs)    Types: Cigarettes    Start date: 09/28/1975    Quit date: 09/28/1995    Years since quitting: 28.2   Smokeless tobacco: Never  Vaping Use   Vaping status: Never Used  Substance and Sexual Activity   Alcohol use: Not Currently    Alcohol/week: 1.0 standard drink of alcohol    Types: 1 Glasses of wine per week    Comment: rarely   Drug use: No   Sexual activity: Yes  Other Topics Concern   Not on file  Social History Narrative   Remarried. 2017. Widowed 2009. 2 sons. 8 grandkids. 3 greatgrands      Retired from [employer]  Hobbies: golf stopped(hard with back), watch tv (pain with moving around)   Social Drivers of Health   Financial Resource Strain: Low Risk  (12/11/2023)   Overall Financial Resource Strain (CARDIA)    Difficulty of Paying Living Expenses: Not hard at all  Food Insecurity: No Food Insecurity (12/11/2023)   Hunger Vital Sign    Worried About Running Out of Food in the Last Year: Never true    Ran Out of Food in the Last Year: Never true  Transportation Needs: No Transportation Needs (12/11/2023)   PRAPARE - Administrator, Civil Service (Medical): No    Lack of Transportation (Non-Medical): No  Physical Activity: Inactive (12/11/2023)   Exercise Vital Sign    Days of Exercise per Week: 0 days    Minutes of Exercise per Session: 0 min  Stress: No Stress Concern Present (12/11/2023)   [employer] of Occupational Health - Occupational Stress Questionnaire    Feeling of Stress: Not at all  Social Connections: Moderately Integrated (12/11/2023)   Social Connection and Isolation Panel    Frequency of Communication with Friends and Family: Twice a week    Frequency of Social Gatherings with Friends and Family: Twice a week    Attends Religious Services: More than 4 times per year    Active Member of [employer] or Organizations: No    Attends Engineer, structural:  Never    Marital Status: Married    Tobacco Counseling Counseling given: Not Answered    Clinical Intake:  Pre-visit preparation completed: Yes  Pain : No/denies pain     BMI - recorded: 31.33 Nutritional Status: BMI > 30  Obese Nutritional Risks: None Diabetes: Yes CBG done?: No CBG resulted in Enter/ Edit results?: No Did pt. bring in CBG monitor from home?: No  Lab Results  Component Value Date   HGBA1C 6.8 (H) 09/18/2023   HGBA1C 6.2 09/29/2022   HGBA1C 7.0 (H) 03/29/2022     How often do you need to have someone help you when you read instructions, pamphlets, or other written materials from your doctor or pharmacy?: 1 - Never  Interpreter Needed?: No  Information entered by :: Ellouise Haws, LPN   Activities of Daily Living     12/11/2023   10:47 AM  In your present state of health, do you have any difficulty performing the following activities:  Hearing? 1  Comment hearing aids  Vision? 0  Difficulty concentrating or making decisions? 0  Walking or climbing stairs? 1  Comment uses a walker  Dressing or bathing? 0  Doing errands, shopping? 0  Preparing Food and eating ? N  Using the Toilet? N  In the past six months, have you accidently leaked urine? N  Do you have problems with loss of bowel control? N  Managing your Medications? N  Managing your Finances? N  Housekeeping or managing your Housekeeping? N    Patient Care Team: Katrinka Garnette KIDD, MD as PCP - General (Family Medicine) Waddell Danelle ORN, MD as PCP - Electrophysiology (Cardiology) Carles Gear, MD as Consulting Physician (Neurosurgery) Geraldene Loving, MD as Consulting Physician (Ophthalmology) Letha Cancer, MD as Consulting Physician (Physical Medicine and Rehabilitation) Gershon Donnice SAUNDERS, DPM as Consulting Physician (Podiatry) Nicholaus Sherlean CROME, [facility] (Inactive) (Pharmacist) Lbcardiology, Rounding, MD as Rounding Team (Cardiology) Federico Norleen ONEIDA MADISON, MD as Consulting  Physician (Hematology and Oncology)  I have updated your Care Teams any recent Medical Services you may have received from other providers in  the past year.     Assessment:   This is a routine wellness examination for [place].  Hearing/Vision screen Hearing Screening - Comments:: Pt has hearing aids  Vision Screening - Comments:: Wears rx glasses - up to date with routine eye exams with Dr Patrcia    Goals Addressed             This Visit's Progress    Patient Stated       Get out of pain        Depression Screen     12/11/2023   10:54 AM 12/06/2022    3:39 PM 05/05/2022   11:04 AM 11/29/2021    2:57 PM 02/08/2021    2:46 PM 11/13/2020    2:33 PM 08/07/2020    3:08 PM  PHQ 2/9 Scores  PHQ - 2 Score 0 0 0 0 0 0 0  PHQ- 9 Score       0    Fall Risk     12/11/2023   10:57 AM 12/05/2022    8:45 PM 03/29/2022    8:49 AM 11/29/2021    2:59 PM 02/08/2021    2:45 PM  Fall Risk   Falls in the past year? 1 1 1  0 1  Number falls in past yr: 1 1 1  0 1  Injury with Fall? 0 0 0 0 1  Risk for fall due to : History of fall(s);Impaired balance/gait;Impaired mobility Impaired vision;History of fall(s) History of fall(s) Impaired vision;Impaired balance/gait Impaired balance/gait;Impaired mobility  Follow up Falls prevention discussed Falls prevention discussed Falls evaluation completed  Falls prevention discussed  Falls evaluation completed      Data saved with a previous flowsheet row definition    MEDICARE RISK AT HOME:  Medicare Risk at Home Any stairs in or around the home?: Yes If so, are there any without handrails?: No Home free of loose throw rugs in walkways, pet beds, electrical cords, etc?: Yes Adequate lighting in your home to reduce risk of falls?: Yes Life alert?: Yes Use of a cane, walker or w/c?: Yes (walker) Grab bars in the bathroom?: Yes Shower chair or bench in shower?: Yes Elevated toilet seat or a handicapped toilet?: Yes  TIMED UP AND GO:  Was the test  performed?  No  Cognitive Function: 6CIT completed        12/11/2023   10:58 AM 12/06/2022    3:42 PM 11/29/2021    3:01 PM 11/13/2020    2:37 PM 09/12/2019    8:37 AM  6CIT Screen  What Year? 0 points 0 points 0 points 4 points 0 points  What month? 0 points 0 points 0 points 0 points 0 points  What time? 0 points 0 points 0 points 0 points 0 points  Count back from 20 0 points 0 points 0 points 0 points 0 points  Months in reverse 0 points 0 points 0 points 0 points 0 points  Repeat phrase 0 points 0 points 6 points 0 points 0 points  Total Score 0 points 0 points 6 points 4 points 0 points    Immunizations Immunization History  Administered Date(s) Administered   Fluad Quad(high Dose 65+) 01/02/2019, 02/27/2020, 02/08/2021, 02/09/2022   Influenza Split 02/10/2011, 01/11/2012   Influenza Whole 03/15/2007, 01/31/2008, 02/04/2009, 02/01/2010   Influenza, High Dose Seasonal PF 01/18/2016, 01/24/2017, 01/29/2018   Influenza,inj,Quad PF,6+ Mos 02/12/2014, 01/13/2015   Moderna Covid-19 Vaccine Bivalent Booster 33yrs & up 02/09/2022   Moderna Sars-Covid-2 Vaccination 05/24/2019, 06/10/2019,  02/29/2020, 08/06/2020   Pneumococcal Conjugate-13 03/23/2015   Pneumococcal Polysaccharide-23 03/24/2009, 02/12/2014   Td 03/09/2007   Tdap 09/19/2017   Zoster Recombinant(Shingrix) 03/20/2023    Screening Tests Health Maintenance  Topic Date Due   COVID-19 Vaccine (6 - 2024-25 season) 01/01/2023   FOOT EXAM  09/29/2023   OPHTHALMOLOGY EXAM  11/08/2023   INFLUENZA VACCINE  12/01/2023   HEMOGLOBIN A1C  03/20/2024   Medicare Annual Wellness (AWV)  12/10/2024   DTaP/Tdap/Td (3 - Td or Tdap) 09/20/2027   Pneumococcal Vaccine: 50+ Years  Completed   Hepatitis B Vaccines  Aged Out   HPV VACCINES  Aged Out   Meningococcal B Vaccine  Aged Out   Zoster Vaccines- Shingrix  Discontinued    Health Maintenance  Health Maintenance Due  Topic Date Due   COVID-19 Vaccine (6 - 2024-25 season)  01/01/2023   FOOT EXAM  09/29/2023   OPHTHALMOLOGY EXAM  11/08/2023   INFLUENZA VACCINE  12/01/2023   Health Maintenance Items Addressed: Diabetic Foot Exam recommended, pt stated he has an upcoming eye appt this month   Additional Screening:  Vision Screening: Recommended annual ophthalmology exams for early detection of glaucoma and other disorders of the eye. Would you like a referral to an eye doctor? No    Dental Screening: Recommended annual dental exams for proper oral hygiene  Community Resource Referral / Chronic Care Management: CRR required this visit?  No   CCM required this visit?  No   Plan:    I have personally reviewed and noted the following in the patient's chart:   Medical and social history Use of alcohol, tobacco or illicit drugs  Current medications and supplements including opioid prescriptions. Patient is currently taking opioid prescriptions. Information provided to patient regarding non-opioid alternatives. Patient advised to discuss non-opioid treatment plan with their provider. Functional ability and status Nutritional status Physical activity Advanced directives List of other physicians Hospitalizations, surgeries, and ER visits in previous 12 months Vitals Screenings to include cognitive, depression, and falls Referrals and appointments  In addition, I have reviewed and discussed with patient certain preventive protocols, quality metrics, and best practice recommendations. A written personalized care plan for preventive services as well as general preventive health recommendations were provided to patient.   Ellouise VEAR Haws, LPN   1/[id number]/7974   After Visit Summary: (MyChart) Due to this being a telephonic visit, the after visit summary with patients personalized plan was offered to patient via MyChart   Notes: Nothing significant to report at this time.

## 2023-12-20 DIAGNOSIS — H43813 Vitreous degeneration, bilateral: Secondary | ICD-10-CM | POA: Diagnosis not present

## 2023-12-20 DIAGNOSIS — H2513 Age-related nuclear cataract, bilateral: Secondary | ICD-10-CM | POA: Diagnosis not present

## 2023-12-20 DIAGNOSIS — H524 Presbyopia: Secondary | ICD-10-CM | POA: Diagnosis not present

## 2023-12-20 DIAGNOSIS — H5203 Hypermetropia, bilateral: Secondary | ICD-10-CM | POA: Diagnosis not present

## 2023-12-20 DIAGNOSIS — E119 Type 2 diabetes mellitus without complications: Secondary | ICD-10-CM | POA: Diagnosis not present

## 2023-12-20 DIAGNOSIS — H25013 Cortical age-related cataract, bilateral: Secondary | ICD-10-CM | POA: Diagnosis not present

## 2023-12-20 DIAGNOSIS — H401132 Primary open-angle glaucoma, bilateral, moderate stage: Secondary | ICD-10-CM | POA: Diagnosis not present

## 2023-12-20 DIAGNOSIS — H52203 Unspecified astigmatism, bilateral: Secondary | ICD-10-CM | POA: Diagnosis not present

## 2023-12-20 LAB — HM DIABETES EYE EXAM

## 2023-12-25 ENCOUNTER — Other Ambulatory Visit: Payer: Self-pay | Admitting: Family Medicine

## 2024-01-05 ENCOUNTER — Telehealth: Payer: Self-pay | Admitting: Family Medicine

## 2024-01-05 NOTE — Telephone Encounter (Unsigned)
 Copied from CRM [id number]. Topic: General - Other >> Jan 05, 2024  2:39 PM [place] J wrote: Reason for CRM: Patient is needing a form from provider verifying his handicap status so he can return the paperwork to the [facility].  Please call patient with an update.  Callback For DMV: [phone]

## 2024-01-05 NOTE — Telephone Encounter (Signed)
 Forms filled out and placed at front desk for pick up. Patient aware.

## 2024-01-08 ENCOUNTER — Encounter: Payer: Self-pay | Admitting: Family Medicine

## 2024-01-12 ENCOUNTER — Other Ambulatory Visit: Payer: Self-pay | Admitting: Family Medicine

## 2024-01-15 DIAGNOSIS — H25011 Cortical age-related cataract, right eye: Secondary | ICD-10-CM | POA: Diagnosis not present

## 2024-01-15 DIAGNOSIS — H25811 Combined forms of age-related cataract, right eye: Secondary | ICD-10-CM | POA: Diagnosis not present

## 2024-01-15 DIAGNOSIS — H2511 Age-related nuclear cataract, right eye: Secondary | ICD-10-CM | POA: Diagnosis not present

## 2024-01-18 DIAGNOSIS — M7918 Myalgia, other site: Secondary | ICD-10-CM | POA: Diagnosis not present

## 2024-01-18 DIAGNOSIS — M961 Postlaminectomy syndrome, not elsewhere classified: Secondary | ICD-10-CM | POA: Diagnosis not present

## 2024-01-31 ENCOUNTER — Other Ambulatory Visit: Payer: Self-pay | Admitting: Family Medicine

## 2024-02-01 NOTE — Progress Notes (Signed)
 Remote PPM Transmission

## 2024-02-05 DIAGNOSIS — H25012 Cortical age-related cataract, left eye: Secondary | ICD-10-CM | POA: Diagnosis not present

## 2024-02-05 DIAGNOSIS — H25812 Combined forms of age-related cataract, left eye: Secondary | ICD-10-CM | POA: Diagnosis not present

## 2024-02-05 DIAGNOSIS — H2512 Age-related nuclear cataract, left eye: Secondary | ICD-10-CM | POA: Diagnosis not present

## 2024-02-09 ENCOUNTER — Other Ambulatory Visit: Payer: Medicare Other

## 2024-02-15 ENCOUNTER — Other Ambulatory Visit: Payer: Self-pay | Admitting: Physician Assistant

## 2024-02-15 DIAGNOSIS — C9110 Chronic lymphocytic leukemia of B-cell type not having achieved remission: Secondary | ICD-10-CM

## 2024-02-16 ENCOUNTER — Inpatient Hospital Stay: Payer: Medicare Other | Attending: Physician Assistant | Admitting: Physician Assistant

## 2024-02-16 ENCOUNTER — Ambulatory Visit: Payer: Medicare Other | Admitting: Physician Assistant

## 2024-02-16 ENCOUNTER — Inpatient Hospital Stay: Payer: Medicare Other

## 2024-02-16 VITALS — BP 141/77 | HR 92 | Temp 97.9°F | Resp 18 | Wt 214.7 lb

## 2024-02-16 DIAGNOSIS — R634 Abnormal weight loss: Secondary | ICD-10-CM | POA: Insufficient documentation

## 2024-02-16 DIAGNOSIS — C9110 Chronic lymphocytic leukemia of B-cell type not having achieved remission: Secondary | ICD-10-CM | POA: Insufficient documentation

## 2024-02-16 DIAGNOSIS — Z87891 Personal history of nicotine dependence: Secondary | ICD-10-CM | POA: Insufficient documentation

## 2024-02-16 LAB — CMP (CANCER CENTER ONLY)
ALT: 19 U/L (ref 0–44)
AST: 15 U/L (ref 15–41)
Albumin: 3.8 g/dL (ref 3.5–5.0)
Alkaline Phosphatase: 66 U/L (ref 38–126)
Anion gap: 5 (ref 5–15)
BUN: 25 mg/dL — ABNORMAL HIGH (ref 8–23)
CO2: 26 mmol/L (ref 22–32)
Calcium: 9.7 mg/dL (ref 8.9–10.3)
Chloride: 109 mmol/L (ref 98–111)
Creatinine: 1.56 mg/dL — ABNORMAL HIGH (ref 0.61–1.24)
GFR, Estimated: 42 mL/min — ABNORMAL LOW (ref >60)
Glucose, Bld: 253 mg/dL — ABNORMAL HIGH (ref 70–99)
Potassium: 5 mmol/L (ref 3.5–5.1)
Sodium: 140 mmol/L (ref 135–145)
Total Bilirubin: 1.2 mg/dL (ref 0.0–1.2)
Total Protein: 6.4 g/dL — ABNORMAL LOW (ref 6.5–8.1)

## 2024-02-16 LAB — CBC WITH DIFFERENTIAL (CANCER CENTER ONLY)
Abs Immature Granulocytes: 0.03 K/uL (ref 0.00–0.07)
Basophils Absolute: 0 K/uL (ref 0.0–0.1)
Basophils Relative: 0 %
Eosinophils Absolute: 0.1 K/uL (ref 0.0–0.5)
Eosinophils Relative: 1 %
HCT: 38.7 % — ABNORMAL LOW (ref 39.0–52.0)
Hemoglobin: 12.9 g/dL — ABNORMAL LOW (ref 13.0–17.0)
Immature Granulocytes: 0 %
Lymphocytes Relative: 51 %
Lymphs Abs: 5 K/uL — ABNORMAL HIGH (ref 0.7–4.0)
MCH: 29.3 pg (ref 26.0–34.0)
MCHC: 33.3 g/dL (ref 30.0–36.0)
MCV: 88 fL (ref 80.0–100.0)
Monocytes Absolute: 0.3 K/uL (ref 0.1–1.0)
Monocytes Relative: 3 %
Neutro Abs: 4.4 K/uL (ref 1.7–7.7)
Neutrophils Relative %: 45 %
Platelet Count: 155 K/uL (ref 150–400)
RBC: 4.4 MIL/uL (ref 4.22–5.81)
RDW: 13.6 % (ref 11.5–15.5)
WBC Count: 9.9 K/uL (ref 4.0–10.5)
nRBC: 0 % (ref 0.0–0.2)

## 2024-02-16 LAB — LACTATE DEHYDROGENASE: LDH: 135 U/L (ref 98–192)

## 2024-02-16 NOTE — Progress Notes (Signed)
 [facility] [phone]   [phone]  PROGRESS NOTE  Patient Care Team: Katrinka Garnette KIDD, MD as PCP - General (Family Medicine) Waddell Danelle ORN, MD as PCP - Electrophysiology (Cardiology) Carles Gear, MD as Consulting Physician (Neurosurgery) Geraldene Loving, MD as Consulting Physician (Ophthalmology) Letha Cancer, MD as Consulting Physician (Physical Medicine and Rehabilitation) Gershon Donnice SAUNDERS, DPM as Consulting Physician (Podiatry) Nicholaus, Sherlean CROME, [facility] (Inactive) (Pharmacist) Lbcardiology, Rounding, MD as Rounding Team (Cardiology) Federico Norleen ONEIDA MADISON, MD as Consulting Physician (Hematology and Oncology)  CHIEF COMPLAINTS/PURPOSE OF CONSULTATION:  Chronic lymphocytic leukemia, diagnosed in 2007.   HISTORY OF PRESENTING ILLNESS:  Kevin Rogers 89 y.o. male returns for a follow up for CLL. He was last seen on 02/10/2023.  In the interim, he denies any changes to his health. He is accompanied by his wife for this visit.   On exam today, Kevin Rogers reports he is doing well with overall stable energy. His appetite has declined over the past year which has resulted in 15 lb weight loss. He continues to use a walker for ambulation.He denies nausea, vomiting or bowel habit changes. He denies easy bruising or signs of bleeding. He denies fevers, chills, sweats, shortness of breath, chest pain, cough or any palpable lumps. He has no other complaints. Rest of the ROS is below.   MEDICAL HISTORY:  Past Medical History:  Diagnosis Date   Acute bronchitis 09/12/2007   Qualifier: Diagnosis of  By: Mavis MD, Norleen BRAVO    Diabetes mellitus type 2 with complications (HCC) 1997   Glaucoma    left eye   Gout    Hyperlipidemia    Hypertension    Leukemia, chronic (HCC) 2001   does not take treatment just has blood levels checked once a year    SURGICAL HISTORY: Past Surgical History:  Procedure Laterality Date   ANTERIOR LAT LUMBAR FUSION Left  01/03/2013   Procedure: ANTERIOR LATERAL LUMBAR FUSION lumbar three-four;  Surgeon: Gear KIDD Carles, MD;  Location: [facility];  Service: Neurosurgery;  Laterality: Left;   BACK SURGERY     lumbar fusion - 2016, feb   COLONOSCOPY W/ POLYPECTOMY     DENTAL SURGERY     implanted teeth x 2   HERNIA REPAIR  1989   LUMBAR LAMINECTOMY  1997   LUMBAR PERCUTANEOUS PEDICLE SCREW 1 LEVEL  01/03/2013   Procedure: LUMBAR PERCUTANEOUS PEDICLE SCREWS LUMBAR THREE-FOUR;  Surgeon: Gear KIDD Carles, MD;  Location: [facility];  Service: Neurosurgery;;   LUMBAR WOUND DEBRIDEMENT N/A 07/23/2014   Procedure: LUMBAR WOUND DEBRIDEMENT;  Surgeon: Gear Carles, MD;  Location: [facility];  Service: Neurosurgery;  Laterality: N/A;   PACEMAKER IMPLANT N/A 08/22/2022   Procedure: PACEMAKER IMPLANT;  Surgeon: Waddell Danelle ORN, MD;  Location: [facility] INVASIVE CV LAB;  Service: Cardiovascular;  Laterality: N/A;   TONSILLECTOMY      SOCIAL HISTORY: Social History   Socioeconomic History   Marital status: Married    Spouse name: Not on file   Number of children: Not on file   Years of education: Not on file   Highest education level: Associate degree: occupational, Scientist, product/process development, or vocational program  Occupational History   Occupation: Retired     Comment: -Comptroller   Tobacco Use   Smoking status: Former    Current packs/day: 0.00    Average packs/day: 0.3 packs/day for 20.0 years (5.0 ttl pk-yrs)    Types: Cigarettes    Start date: 09/28/1975  Quit date: 09/28/1995    Years since quitting: 28.4   Smokeless tobacco: Never  Vaping Use   Vaping status: Never Used  Substance and Sexual Activity   Alcohol use: Not Currently    Alcohol/week: 1.0 standard drink of alcohol    Types: 1 Glasses of wine per week    Comment: rarely   Drug use: No   Sexual activity: Yes  Other Topics Concern   Not on file  Social History Narrative   Remarried. 2017. Widowed 2009. 2 sons. 8 grandkids. 3 greatgrands       Retired from [employer]       Hobbies: golf stopped(hard with back), watch tv (pain with moving around)   Social Drivers of Health   Financial Resource Strain: Low Risk  (12/11/2023)   Overall Financial Resource Strain (CARDIA)    Difficulty of Paying Living Expenses: Not hard at all  Food Insecurity: No Food Insecurity (12/11/2023)   Hunger Vital Sign    Worried About Running Out of Food in the Last Year: Never true    Ran Out of Food in the Last Year: Never true  Transportation Needs: No Transportation Needs (12/11/2023)   PRAPARE - Administrator, Civil Service (Medical): No    Lack of Transportation (Non-Medical): No  Physical Activity: Inactive (12/11/2023)   Exercise Vital Sign    Days of Exercise per Week: 0 days    Minutes of Exercise per Session: 0 min  Stress: No Stress Concern Present (12/11/2023)   [employer] of Occupational Health - Occupational Stress Questionnaire    Feeling of Stress: Not at all  Social Connections: Moderately Integrated (12/11/2023)   Social Connection and Isolation Panel    Frequency of Communication with Friends and Family: Twice a week    Frequency of Social Gatherings with Friends and Family: Twice a week    Attends Religious Services: More than 4 times per year    Active Member of [employer] or Organizations: No    Attends Banker Meetings: Never    Marital Status: Married  Catering manager Violence: Not At Risk (12/11/2023)   Humiliation, Afraid, Rape, and Kick questionnaire    Fear of Current or Ex-Partner: No    Emotionally Abused: No    Physically Abused: No    Sexually Abused: No    FAMILY HISTORY: Family History  Problem Relation Age of Onset   Heart disease Mother        73, second hand   Heart disease Father        28, former smoker    ALLERGIES:  has no known allergies.  MEDICATIONS:  Current Outpatient Medications  Medication Sig Dispense Refill   acetaminophen  (TYLENOL ) 325 MG tablet Take 1-2  tablets (325-650 mg total) by mouth every 4 (four) hours as needed for mild pain.     atorvastatin  (LIPITOR) 10 MG tablet TAKE 1 TABLET BY MOUTH WEEKLY 15 tablet 2   BD PEN NEEDLE NANO 2ND GEN 32G X 4 MM MISC USE AS DIRECTED 100 each 1   Blood Glucose Monitoring Suppl (ACCU-CHEK GUIDE) w/Device KIT  1 kit 0   COMIRNATY syringe      finasteride  (PROSCAR ) 5 MG tablet TAKE 1 TABLET BY MOUTH EVERY DAY 90 tablet 3   fluticasone  (FLONASE ) 50 MCG/ACT nasal spray SPRAY 2 SPRAYS INTO EACH NOSTRIL EVERY DAY 16 mL 1   HYDROcodone -acetaminophen  (NORCO) 7.5-325 MG tablet Take 1 tablet by mouth 2 (two) times daily.  insulin  degludec (TRESIBA  FLEXTOUCH) 100 UNIT/ML FlexTouch Pen Inject 14 Units into the skin daily. 15 mL 3   losartan  (COZAAR ) 50 MG tablet Take 1 tablet (50 mg total) by mouth daily. 90 tablet 3   Misc Natural Products (GLUCOSAMINE CHOND CMP ADVANCED PO) Take by mouth in the morning and at bedtime.     ONE TOUCH LANCETS MISC Use to test blood sugars. Dx:E11.9 100 each 11   ONETOUCH VERIO test strip USE TO TEST BLOOD SUGARS DAILY 100 strip 12   tamsulosin  (FLOMAX ) 0.4 MG CAPS capsule TAKE 1 CAPSULE BY MOUTH DAILY 100 capsule 2   timolol (TIMOPTIC) 0.5 % ophthalmic solution 1 drop 2 (two) times daily.     TRAVATAN  Z 0.004 % SOLN ophthalmic solution Place 1 drop into the left eye at bedtime.      vitamin B-12 (CYANOCOBALAMIN ) 1000 MCG tablet Take 1,000 mcg by mouth daily.     No current facility-administered medications for this visit.    REVIEW OF SYSTEMS:   Constitutional: ( - ) fevers, ( - )  chills , ( - ) night sweats Eyes: ( - ) blurriness of vision, ( - ) double vision, ( - ) watery eyes Ears, nose, mouth, throat, and face: ( - ) mucositis, ( - ) sore throat Respiratory: ( - ) cough, ( - ) dyspnea, ( - ) wheezes Cardiovascular: ( - ) palpitation, ( - ) chest discomfort, ( - ) lower extremity swelling Gastrointestinal:  ( - ) nausea, ( - ) heartburn, ( - ) change in bowel  habits Skin: ( - ) abnormal skin rashes Lymphatics: ( - ) new lymphadenopathy, ( - ) easy bruising Neurological: ( - ) numbness, ( - ) tingling, ( - ) new weaknesses Behavioral/Psych: ( - ) mood change, ( - ) new changes  All other systems were reviewed with the patient and are negative.  PHYSICAL EXAMINATION: ECOG PERFORMANCE STATUS: 0 - Asymptomatic  Vitals:   02/16/24 1335  BP: (!) 141/77  Pulse: 92  Resp: 18  Temp: 97.9 F (36.6 C)  SpO2: 98%   Filed Weights   02/16/24 1335  Weight: 214 lb 11.2 oz (97.4 kg)    GENERAL: well appearing male in NAD  SKIN: skin color, texture, turgor are normal, no rashes or significant lesions EYES: conjunctiva are pink and non-injected, sclera clear LYMPH:  no palpable lymphadenopathy in the cervical or supraclavicular lymph nodes.  LUNGS: clear to auscultation and percussion with normal breathing effort HEART: regular rate & rhythm and no murmurs and no lower extremity edema. Musculoskeletal: no cyanosis of digits and no clubbing  PSYCH: alert & oriented x 3, fluent speech NEURO: no focal motor/sensory deficits  LABORATORY DATA:  I have reviewed the data as listed    Latest Ref Rng & Units 02/16/2024    1:00 PM 09/18/2023   12:09 PM 02/10/2023    1:28 PM  CBC  WBC 4.0 - 10.5 K/uL 9.9  8.3  8.6   Hemoglobin 13.0 - 17.0 g/dL 87.0  87.7  87.3   Hematocrit 39.0 - 52.0 % 38.7  37.6  39.1   Platelets 150 - 400 K/uL 155  184.0  157        Latest Ref Rng & Units 09/18/2023   12:09 PM 02/10/2023    1:28 PM 09/29/2022    2:57 PM  CMP  Glucose 70 - 99 mg/dL 93  848  85   BUN 6 - 23 mg/dL 20  26  25   Creatinine 0.40 - 1.50 mg/dL 8.70  8.37  8.33   Sodium 135 - 145 mEq/L 142  142  142   Potassium 3.5 - 5.1 mEq/L 4.8  4.9  4.9   Chloride 96 - 112 mEq/L 109  110  107   CO2 19 - 32 mEq/L 28  27  28    Calcium  8.4 - 10.5 mg/dL 9.3  9.6  9.4   Total Protein 6.0 - 8.3 g/dL 6.4  6.5  6.6   Total Bilirubin 0.2 - 1.2 mg/dL 1.0  0.9  1.1    Alkaline Phos 39 - 117 U/L 67  76  70   AST 0 - 37 U/L 16  15  15    ALT 0 - 53 U/L 12  11  11       PATHOLOGY:  Peripheral Blood Flow Cytometry - CHRONIC LYMPHOCYTIC LEUKEMIA, SEE COMMENT. Diagnosis Comment: There is a monoclonal B-cell population with expression of CD5 and CD23. Light chains are characteristically dim. These findings along with a monoclonal lymphocyte population of 7,500 meets criteria for chronic lymphocytic leukemia.  ASSESSMENT & PLAN Kevin Rogers is a 89 y.o. male who presents to the clinic for follow up for CLL.  #Chronic lymphocytic leukemia: --Previously under the care of Dr. Amadeo under active surveillance --Labs today reviewed and require no intervention. WBC 9.9, Hgb 12.9, Plt 155, ALC 5.0. LDH normal.  --No B symptoms except for weight loss.  --Encouraged patient to supplement diet with protein shakes and monitor weight closely. Advised to follow up with us  if there is continued weight loss.  --No indication to start treatment so recommend to continue on surveillance. --RTC in 6 months with labs.    No orders of the defined types were placed in this encounter.   All questions were answered. The patient knows to call the clinic with any problems, questions or concerns.  I have spent a total of 30 minutes minutes of face-to-face and non-face-to-face time, preparing to see the patient,  performing a medically appropriate examination, counseling and educating the patient,documenting clinical information in the electronic health record, independently interpreting results and communicating results to the patient, and care coordination.   Johnston Police, PA-C Department of Hematology/Oncology [facility] at [facility] Phone: [phone]   Shermon HERO, [place] BD, Rosemary BIRCH, Caligaris-Cappio F, Dighiero G, Dhner H, [place] P, [place], [place] E, Chiorazzi N, [place], Rai KR, [place], [place] B, O'Brien S, Robak T,  Seymour JF, Chapman UG. iwCLL guidelines for diagnosis, indications for treatment, response assessment, and supportive management of CLL. Blood. 2018 Jun 21;131(25):2745-2760.  Active disease should be clearly documented to initiate therapy. At least 1 of the following criteria should be met.  1) Evidence of progressive marrow failure as manifested by the development of, or worsening of, anemia and/or thrombocytopenia. Cutoff levels of Hb <10 g/dL or platelet counts <899  109/L are generally regarded as indication for treatment. However, in some patients, platelet counts <100  109/L may remain stable over a long period; this situation does not automatically require therapeutic intervention. 2) Massive (ie, >=6 cm below the left costal margin) or progressive or symptomatic splenomegaly. 3) Massive nodes (ie, >=10 cm in longest diameter) or progressive or symptomatic lymphadenopathy. 4) Progressive lymphocytosis with an increase of >=50% over a 31-month period, or lymphocyte doubling time (LDT) <6 months. LDT can be obtained by linear regression extrapolation of absolute lymphocyte counts obtained at intervals of 2 weeks over an  observation period of 2 to 3 months; patients with initial blood lymphocyte counts <30  109/L may require a longer observation period to determine the LDT. Factors contributing to lymphocytosis other than CLL (eg, infections, steroid administration) should be excluded. 5) Autoimmune complications including anemia or thrombocytopenia poorly responsive to corticosteroids. 6) Symptomatic or functional extranodal involvement (eg, skin, kidney, lung, spine). Disease-related symptoms as defined by any of the following: Unintentional weight loss >=10% within the previous 6 months. Significant fatigue (ie, ECOG performance scale 2 or worse; cannot work or unable to perform usual activities). Fevers >=100.70F or 38.0C for 2 or more weeks without evidence of infection. Night sweats for  >=1 month without evidence of infection.

## 2024-02-22 ENCOUNTER — Ambulatory Visit: Payer: Medicare Other

## 2024-02-22 DIAGNOSIS — I442 Atrioventricular block, complete: Secondary | ICD-10-CM | POA: Diagnosis not present

## 2024-02-23 ENCOUNTER — Ambulatory Visit: Payer: Self-pay | Admitting: Internal Medicine

## 2024-02-23 LAB — CUP PACEART REMOTE DEVICE CHECK
Battery Remaining Longevity: 111 mo
Battery Voltage: 3.01 V
Brady Statistic AP VP Percent: 61.38 %
Brady Statistic AP VS Percent: 0 %
Brady Statistic AS VP Percent: 38.59 %
Brady Statistic AS VS Percent: 0.02 %
Brady Statistic RA Percent Paced: 61.35 %
Brady Statistic RV Percent Paced: 99.98 %
Date Time Interrogation Session: 20251022203422
Implantable Lead Connection Status: 753985
Implantable Lead Connection Status: 753985
Implantable Lead Implant Date: 20240422
Implantable Lead Implant Date: 20240422
Implantable Lead Location: 753859
Implantable Lead Location: 753860
Implantable Lead Model: 3830
Implantable Lead Model: 5076
Implantable Pulse Generator Implant Date: 20240422
Lead Channel Impedance Value: 285 Ohm
Lead Channel Impedance Value: 304 Ohm
Lead Channel Impedance Value: 342 Ohm
Lead Channel Impedance Value: 418 Ohm
Lead Channel Pacing Threshold Amplitude: 0.375 V
Lead Channel Pacing Threshold Amplitude: 0.875 V
Lead Channel Pacing Threshold Pulse Width: 0.4 ms
Lead Channel Pacing Threshold Pulse Width: 0.4 ms
Lead Channel Sensing Intrinsic Amplitude: 3.25 mV
Lead Channel Sensing Intrinsic Amplitude: 3.25 mV
Lead Channel Sensing Intrinsic Amplitude: 9.75 mV
Lead Channel Sensing Intrinsic Amplitude: 9.75 mV
Lead Channel Setting Pacing Amplitude: 1.5 V
Lead Channel Setting Pacing Amplitude: 2.5 V
Lead Channel Setting Pacing Pulse Width: 0.4 ms
Lead Channel Setting Sensing Sensitivity: 1.2 mV
Zone Setting Status: 755011

## 2024-02-26 NOTE — Progress Notes (Signed)
 Remote PPM Transmission

## 2024-03-21 ENCOUNTER — Ambulatory Visit: Admitting: Family Medicine

## 2024-03-21 ENCOUNTER — Encounter: Payer: Self-pay | Admitting: Family Medicine

## 2024-03-21 VITALS — BP 108/62 | HR 70 | Temp 97.3°F | Ht 72.0 in | Wt 225.6 lb

## 2024-03-21 DIAGNOSIS — E1169 Type 2 diabetes mellitus with other specified complication: Secondary | ICD-10-CM

## 2024-03-21 DIAGNOSIS — Z7984 Long term (current) use of oral hypoglycemic drugs: Secondary | ICD-10-CM | POA: Diagnosis not present

## 2024-03-21 DIAGNOSIS — N1831 Chronic kidney disease, stage 3a: Secondary | ICD-10-CM | POA: Diagnosis not present

## 2024-03-21 DIAGNOSIS — I442 Atrioventricular block, complete: Secondary | ICD-10-CM

## 2024-03-21 DIAGNOSIS — E118 Type 2 diabetes mellitus with unspecified complications: Secondary | ICD-10-CM

## 2024-03-21 DIAGNOSIS — I10 Essential (primary) hypertension: Secondary | ICD-10-CM

## 2024-03-21 DIAGNOSIS — E1122 Type 2 diabetes mellitus with diabetic chronic kidney disease: Secondary | ICD-10-CM | POA: Diagnosis not present

## 2024-03-21 DIAGNOSIS — E785 Hyperlipidemia, unspecified: Secondary | ICD-10-CM

## 2024-03-21 LAB — POCT GLYCOSYLATED HEMOGLOBIN (HGB A1C): Hemoglobin A1C: 6.5 % — AB (ref 4.0–5.6)

## 2024-03-21 MED ORDER — BLOOD GLUCOSE TEST VI STRP: 1.0000 | ORAL_STRIP | Freq: Three times a day (TID) | 3 refills | Status: AC

## 2024-03-21 MED ORDER — BLOOD GLUCOSE MONITORING SUPPL DEVI: 1.0000 | Freq: Three times a day (TID) | 0 refills | Status: AC

## 2024-03-21 MED ORDER — LANCETS MISC: 1.0000 | 3 refills | Status: AC

## 2024-03-21 MED ORDER — LANCET DEVICE MISC
1.0000 | Freq: Three times a day (TID) | 0 refills | Status: AC
Start: 2024-03-21 — End: 2024-04-20

## 2024-03-21 NOTE — Patient Instructions (Addendum)
 Lab Results  Component Value Date   HGBA1C 6.5 (A) 03/21/2024   HGBA1C 6.8 (H) 09/18/2023   HGBA1C 6.2 09/29/2022  Congrats on good a1c today. No changes other than wrote for new meter  Send us  dates of COVID and RSV from [facility]  Recommended follow up: Return in about 6 months (around 09/18/2024) for physical or sooner if needed.Schedule b4 you leave.

## 2024-03-21 NOTE — Progress Notes (Signed)
 Phone [phone] In person visit   Subjective:   Kevin Rogers is a 89 y.o. year old very pleasant male patient who presents for/with See problem oriented charting Chief Complaint  Patient presents with   Medical Management of Chronic Issues    6 month follow up; per wife patients appetite has decreased;    Hypertension   Diabetes   Past Medical History-  Patient Active Problem List   Diagnosis Date Noted   Complete heart block (HCC) 08/22/2022    Priority: High   Chronic lymphocytic leukemia (HCC) 03/08/2007    Priority: High   Diabetes mellitus type II, controlled (HCC) 03/08/2007    Priority: High   History of osteomyelitis of lumbar vertebrae     Priority: Medium    Hyperlipidemia     Priority: Medium    CKD (chronic kidney disease), stage III (HCC) 01/13/2015    Priority: Medium    Lumbar spinal stenosis 06/26/2014    Priority: Medium    BPH (benign prostatic hyperplasia) 06/24/2014    Priority: Medium    Gout 03/08/2007    Priority: Medium    Essential hypertension 03/08/2007    Priority: Medium    Enteritis due to Clostridium difficile     Priority: Low   Discitis of lumbar region     Priority: Low   B12 deficiency 12/12/2008    Priority: Low   Diabetic polyneuropathy (HCC) 12/12/2008    Priority: Low   ERECTILE DYSFUNCTION 03/18/2008    Priority: Low   Glaucoma 03/08/2007    Priority: Low   Osteoarthritis 03/08/2007    Priority: Low    Medications- reviewed and updated Current Outpatient Medications  Medication Sig Dispense Refill   acetaminophen  (TYLENOL ) 325 MG tablet Take 1-2 tablets (325-650 mg total) by mouth every 4 (four) hours as needed for mild pain.     atorvastatin  (LIPITOR) 10 MG tablet TAKE 1 TABLET BY MOUTH WEEKLY 15 tablet 2   BD PEN NEEDLE NANO 2ND GEN 32G X 4 MM MISC USE AS DIRECTED 100 each 1   Blood Glucose Monitoring Suppl (ACCU-CHEK GUIDE) w/Device KIT  1 kit 0   Blood Glucose Monitoring Suppl DEVI 1 each by Does not apply  route in the morning, at noon, and at bedtime. May substitute to any manufacturer covered by patient's insurance. 1 each 0   COMIRNATY syringe      finasteride  (PROSCAR ) 5 MG tablet TAKE 1 TABLET BY MOUTH EVERY DAY 90 tablet 3   fluticasone  (FLONASE ) 50 MCG/ACT nasal spray SPRAY 2 SPRAYS INTO EACH NOSTRIL EVERY DAY 16 mL 1   Glucose Blood (BLOOD GLUCOSE TEST STRIPS) STRP 1 each by In Vitro route in the morning, at noon, and at bedtime. May substitute to any manufacturer covered by patient's insurance. 100 strip 3   HYDROcodone -acetaminophen  (NORCO) 7.5-325 MG tablet Take 1 tablet by mouth 2 (two) times daily.     insulin  degludec (TRESIBA  FLEXTOUCH) 100 UNIT/ML FlexTouch Pen Inject 14 Units into the skin daily. 15 mL 3   Lancet Device MISC 1 each by Does not apply route in the morning, at noon, and at bedtime. May substitute to any manufacturer covered by patient's insurance. 1 each 0   Lancets MISC 1 each by Does not apply route as directed. Dispense based on patient and insurance preference. Use up to four times daily as directed. (FOR ICD-10 E10.9, E11.9). 100 each 3   losartan  (COZAAR ) 50 MG tablet Take 1 tablet (50 mg total) by mouth daily.  90 tablet 3   Misc Natural Products (GLUCOSAMINE CHOND CMP ADVANCED PO) Take by mouth in the morning and at bedtime.     prednisoLONE acetate (PRED FORTE) 1 % ophthalmic suspension SMARTSIG:In Eye(s)     tamsulosin  (FLOMAX ) 0.4 MG CAPS capsule TAKE 1 CAPSULE BY MOUTH DAILY 100 capsule 2   timolol (TIMOPTIC) 0.5 % ophthalmic solution 1 drop 2 (two) times daily.     TRAVATAN  Z 0.004 % SOLN ophthalmic solution Place 1 drop into the left eye at bedtime.      vitamin B-12 (CYANOCOBALAMIN ) 1000 MCG tablet Take 1,000 mcg by mouth daily.     No current facility-administered medications for this visit.     Objective:  BP 108/62 (BP Location: Left Arm, Patient Position: Sitting, Cuff Size: Normal)   Pulse 70   Temp (!) 97.3 F (36.3 C) (Temporal)   Ht 6'  (1.829 m)   Wt 225 lb 9.6 oz (102.3 kg)   SpO2 95%   BMI 30.60 kg/m  Gen: NAD, resting comfortably CV: RRR no murmurs rubs or gallops Lungs: CTAB no crackles, wheeze, rhonchi Ext: trace edema- reports worse as day goes on Skin: warm, dry Neuro: grossly normal, moves all extremities     Assessment and Plan     #appetite has dropped- eating 2 meals a day mainly and not eating as much. Down 6 lbs from last visit. Doing some premier protein- uses as a snack. He plans to continue  # Complete heart block-hospitalization August 22, 2022 for pacemaker implantation- noted   #hypertension/CKD stage III S: medication: Losartan  50 mg- potassium issues on 100 mg  Home readings #s: does not check at home except sparingly- 120s or so   GFR typically in the 50s-previously on meloxicam  but stopped in 2017- stable in 40's lately BP Readings from Last 3 Encounters:  03/21/24 108/62  02/16/24 (!) 141/77  09/18/23 132/84  A/P: Hypertension-well controlled continue current medications   CKD stage III- stable- continue current medicines with GFR in 40s   # Diabetes S: Medication: tresiba  20 units lately -prior  Glipizide  5 mg but we took him off to avoid lows CBGs- 90 day average 112- lowest in the 90's. His meter is no longer covered Exercise and diet- No exercise due to immobility  Lab Results  Component Value Date   HGBA1C 6.8 (H) 09/18/2023   HGBA1C 6.2 09/29/2022   HGBA1C 7.0 (H) 03/29/2022   A/P: hopefully stable- update a1c today at 6.5. Continue current meds for now  -needs new meter- trying for acu check guide me or accu check Guide-. Countour plus blue or countour next generic or countour next one or counour nex EZ also at $0- he will check with pharmac  Patient also likely with diabetic polyneuropathy-tingling in the feet and shooting pains-also may be related to his back pain/lumbar stenosis. Stable without specific medicine for this  -also notes in his hands as of 03/2022- ongoing    #hyperlipidemia S: Medication:Atorvastatin  10 mg once a week.   Lab Results  Component Value Date   CHOL 158 09/18/2023   HDL 50.90 09/18/2023   LDLCALC 93 09/18/2023   LDLDIRECT 76.0 11/23/2017   TRIG 70.0 09/18/2023   CHOLHDL 3 09/18/2023   A/P: reasonable control for age- continue current medications    #CLL-continues yearly visits with oncology. Diagnosed in 2000. We will check CBC with differential today to make sure stable at least every 6 months but just had last month with hematology   #  Lumbar spinal stenosis S: Limits mobility-chronic back pain. Currently taking hydrocodone  through [facility]- prior Dr. Letha A/P: ongoing pain- plus shots every 3 months- sensitivity at injection sites with color change and tissue breakdown- he's considering stopping   # B12 deficiency S: Current treatment/medication (oral vs. IM): Oral 1000 mcg Lab Results  Component Value Date   VITAMINB12 716 09/18/2023  A/P: stable- continue current medicines    # BPH S:medication: finasteride  5 mg   , tamsulosin  0.4 mg  . Stream weaker with time even with both medicines. Particularly bad if misses dose A/P: gradual worsening- we will continue to monitor- he wants to hold off on this   #hearing loss- reports getting worse- not using his hearing aids. Reports they can be too loud actually but without them can't hear well- encouraged him to go back- done through the [place]- he plans to go back  Recommended follow up: No follow-ups on file. Future Appointments  Date Time Provider Department Center  05/23/2024  7:05 AM [facility] DEVICE REMOTES [facility]  08/16/2024 11:15 AM [facility] None  08/16/2024 11:40 AM Federico Norleen ONEIDA MADISON, MD [facility] None  08/22/2024  7:05 AM [facility] DEVICE REMOTES [facility]  09/18/2024 11:00 AM Katrinka Garnette KIDD, MD [facility]  12/12/2024 10:40 AM [facility] ANNUAL WELLNESS VISIT 1 [facility]    Lab/Order  associations:   ICD-10-CM   1. Type 2 diabetes mellitus with unspecified complications (HCC)  E11.8 POCT glycosylated hemoglobin (Hb A1C)    2. Hyperlipidemia associated with type 2 diabetes mellitus (HCC)  E11.69    E78.5     3. Essential hypertension  I10       Meds ordered this encounter  Medications   Blood Glucose Monitoring Suppl DEVI    Sig: 1 each by Does not apply route in the morning, at noon, and at bedtime. May substitute to any manufacturer covered by patient's insurance.    Dispense:  1 each    Refill:  0    Accu chek guide me please and if you don't have that the accu check guide is ok   Glucose Blood (BLOOD GLUCOSE TEST STRIPS) STRP    Sig: 1 each by In Vitro route in the morning, at noon, and at bedtime. May substitute to any manufacturer covered by patient's insurance.    Dispense:  100 strip    Refill:  3   Lancet Device MISC    Sig: 1 each by Does not apply route in the morning, at noon, and at bedtime. May substitute to any manufacturer covered by patient's insurance.    Dispense:  1 each    Refill:  0   Lancets MISC    Sig: 1 each by Does not apply route as directed. Dispense based on patient and insurance preference. Use up to four times daily as directed. (FOR ICD-10 E10.9, E11.9).    Dispense:  100 each    Refill:  3    Return precautions advised.  Garnette Katrinka, MD

## 2024-04-20 ENCOUNTER — Other Ambulatory Visit: Payer: Self-pay | Admitting: Family Medicine

## 2024-05-23 ENCOUNTER — Ambulatory Visit: Payer: Medicare Other

## 2024-05-23 DIAGNOSIS — I442 Atrioventricular block, complete: Secondary | ICD-10-CM | POA: Diagnosis not present

## 2024-05-23 LAB — CUP PACEART REMOTE DEVICE CHECK
Battery Remaining Longevity: 108 mo
Battery Voltage: 3 V
Brady Statistic AP VP Percent: 60.27 %
Brady Statistic AP VS Percent: 0 %
Brady Statistic AS VP Percent: 39.7 %
Brady Statistic AS VS Percent: 0.02 %
Brady Statistic RA Percent Paced: 60.24 %
Brady Statistic RV Percent Paced: 99.98 %
Date Time Interrogation Session: 20260121205155
Implantable Lead Connection Status: 753985
Implantable Lead Connection Status: 753985
Implantable Lead Implant Date: 20240422
Implantable Lead Implant Date: 20240422
Implantable Lead Location: 753859
Implantable Lead Location: 753860
Implantable Lead Model: 3830
Implantable Lead Model: 5076
Implantable Pulse Generator Implant Date: 20240422
Lead Channel Impedance Value: 304 Ohm
Lead Channel Impedance Value: 342 Ohm
Lead Channel Impedance Value: 380 Ohm
Lead Channel Impedance Value: 418 Ohm
Lead Channel Pacing Threshold Amplitude: 0.375 V
Lead Channel Pacing Threshold Amplitude: 1 V
Lead Channel Pacing Threshold Pulse Width: 0.4 ms
Lead Channel Pacing Threshold Pulse Width: 0.4 ms
Lead Channel Sensing Intrinsic Amplitude: 3.25 mV
Lead Channel Sensing Intrinsic Amplitude: 3.25 mV
Lead Channel Sensing Intrinsic Amplitude: 6.625 mV
Lead Channel Sensing Intrinsic Amplitude: 6.625 mV
Lead Channel Setting Pacing Amplitude: 1.5 V
Lead Channel Setting Pacing Amplitude: 2.5 V
Lead Channel Setting Pacing Pulse Width: 0.4 ms
Lead Channel Setting Sensing Sensitivity: 1.2 mV
Zone Setting Status: 755011

## 2024-05-25 NOTE — Progress Notes (Signed)
 Remote PPM Transmission

## 2024-05-27 ENCOUNTER — Ambulatory Visit: Payer: Self-pay | Admitting: Cardiology

## 2024-05-31 ENCOUNTER — Other Ambulatory Visit: Payer: Self-pay | Admitting: Family Medicine

## 2024-06-02 ENCOUNTER — Other Ambulatory Visit: Payer: Self-pay | Admitting: Family Medicine

## 2024-08-16 ENCOUNTER — Inpatient Hospital Stay: Admitting: Hematology and Oncology

## 2024-08-16 ENCOUNTER — Inpatient Hospital Stay

## 2024-09-18 ENCOUNTER — Encounter: Admitting: Family Medicine

## 2024-12-12 ENCOUNTER — Ambulatory Visit
